# Patient Record
Sex: Female | Born: 1950 | Race: White | Hispanic: No | Marital: Married | Smoking: Former smoker
Health system: Southern US, Community
[De-identification: ages and names within clinical notes are randomized; demographics above are authoritative.]

## PROBLEM LIST (undated history)

## (undated) DIAGNOSIS — F419 Anxiety disorder, unspecified: Secondary | ICD-10-CM

## (undated) DIAGNOSIS — R079 Chest pain, unspecified: Secondary | ICD-10-CM

## (undated) DIAGNOSIS — Z8709 Personal history of other diseases of the respiratory system: Secondary | ICD-10-CM

## (undated) DIAGNOSIS — K449 Diaphragmatic hernia without obstruction or gangrene: Secondary | ICD-10-CM

## (undated) DIAGNOSIS — IMO0002 Reserved for concepts with insufficient information to code with codable children: Secondary | ICD-10-CM

## (undated) DIAGNOSIS — M797 Fibromyalgia: Secondary | ICD-10-CM

## (undated) DIAGNOSIS — Z780 Asymptomatic menopausal state: Secondary | ICD-10-CM

## (undated) DIAGNOSIS — N3941 Urge incontinence: Secondary | ICD-10-CM

## (undated) DIAGNOSIS — I10 Essential (primary) hypertension: Secondary | ICD-10-CM

## (undated) DIAGNOSIS — Z8701 Personal history of pneumonia (recurrent): Secondary | ICD-10-CM

## (undated) DIAGNOSIS — Z8619 Personal history of other infectious and parasitic diseases: Secondary | ICD-10-CM

## (undated) DIAGNOSIS — Z9289 Personal history of other medical treatment: Secondary | ICD-10-CM

## (undated) DIAGNOSIS — J45909 Unspecified asthma, uncomplicated: Secondary | ICD-10-CM

## (undated) DIAGNOSIS — L309 Dermatitis, unspecified: Secondary | ICD-10-CM

## (undated) DIAGNOSIS — M549 Dorsalgia, unspecified: Secondary | ICD-10-CM

## (undated) DIAGNOSIS — R112 Nausea with vomiting, unspecified: Secondary | ICD-10-CM

## (undated) DIAGNOSIS — G4730 Sleep apnea, unspecified: Secondary | ICD-10-CM

## (undated) DIAGNOSIS — F32A Depression, unspecified: Secondary | ICD-10-CM

## (undated) DIAGNOSIS — M79605 Pain in left leg: Secondary | ICD-10-CM

## (undated) DIAGNOSIS — K5909 Other constipation: Secondary | ICD-10-CM

## (undated) DIAGNOSIS — Z85830 Personal history of malignant neoplasm of bone: Secondary | ICD-10-CM

## (undated) DIAGNOSIS — G629 Polyneuropathy, unspecified: Secondary | ICD-10-CM

## (undated) DIAGNOSIS — M479 Spondylosis, unspecified: Secondary | ICD-10-CM

## (undated) DIAGNOSIS — K219 Gastro-esophageal reflux disease without esophagitis: Secondary | ICD-10-CM

## (undated) DIAGNOSIS — G546 Phantom limb syndrome with pain: Secondary | ICD-10-CM

## (undated) DIAGNOSIS — Z9889 Other specified postprocedural states: Secondary | ICD-10-CM

## (undated) DIAGNOSIS — T8859XA Other complications of anesthesia, initial encounter: Secondary | ICD-10-CM

## (undated) DIAGNOSIS — T4145XA Adverse effect of unspecified anesthetic, initial encounter: Secondary | ICD-10-CM

## (undated) DIAGNOSIS — C801 Malignant (primary) neoplasm, unspecified: Secondary | ICD-10-CM

## (undated) DIAGNOSIS — F329 Major depressive disorder, single episode, unspecified: Secondary | ICD-10-CM

## (undated) DIAGNOSIS — G479 Sleep disorder, unspecified: Secondary | ICD-10-CM

## (undated) DIAGNOSIS — L509 Urticaria, unspecified: Secondary | ICD-10-CM

## (undated) HISTORY — PX: EXTERNAL EAR SURGERY: SHX627

## (undated) HISTORY — PX: BACK SURGERY: SHX140

---

## 1971-01-15 HISTORY — PX: OTHER SURGICAL HISTORY: SHX169

## 1978-09-15 HISTORY — PX: ABDOMINAL HYSTERECTOMY: SHX81

## 2013-07-08 ENCOUNTER — Other Ambulatory Visit: Payer: Self-pay | Admitting: Orthopedic Surgery

## 2013-07-08 NOTE — Progress Notes (Signed)
need orders please - pt coming for preop MON 07/12/13 - thank you

## 2013-07-09 ENCOUNTER — Encounter (HOSPITAL_COMMUNITY): Payer: Self-pay | Admitting: Pharmacy Technician

## 2013-07-12 ENCOUNTER — Other Ambulatory Visit: Payer: Self-pay

## 2013-07-12 ENCOUNTER — Encounter (HOSPITAL_COMMUNITY): Payer: Self-pay | Admitting: Emergency Medicine

## 2013-07-12 ENCOUNTER — Emergency Department (HOSPITAL_COMMUNITY): Payer: BC Managed Care – PPO

## 2013-07-12 ENCOUNTER — Encounter (HOSPITAL_COMMUNITY)
Admission: RE | Admit: 2013-07-12 | Discharge: 2013-07-12 | Disposition: A | Discharge status: Home / Self Care | Payer: BC Managed Care – PPO | Source: Ambulatory Visit | Attending: Orthopedic Surgery | Admitting: Orthopedic Surgery

## 2013-07-12 ENCOUNTER — Encounter (HOSPITAL_COMMUNITY): Payer: Self-pay

## 2013-07-12 ENCOUNTER — Observation Stay (HOSPITAL_COMMUNITY)
Admission: EM | Admit: 2013-07-12 | Discharge: 2013-07-14 | Disposition: A | Discharge status: Home / Self Care | Payer: BC Managed Care – PPO | Attending: Internal Medicine | Admitting: Internal Medicine

## 2013-07-12 ENCOUNTER — Encounter (INDEPENDENT_AMBULATORY_CARE_PROVIDER_SITE_OTHER): Payer: Self-pay

## 2013-07-12 ENCOUNTER — Ambulatory Visit (HOSPITAL_COMMUNITY)
Admission: RE | Admit: 2013-07-12 | Discharge: 2013-07-12 | Disposition: A | Discharge status: Home / Self Care | Payer: BC Managed Care – PPO | Source: Ambulatory Visit | Attending: Orthopedic Surgery | Admitting: Orthopedic Surgery

## 2013-07-12 ENCOUNTER — Ambulatory Visit (HOSPITAL_COMMUNITY)
Admission: RE | Admit: 2013-07-12 | Discharge: 2013-07-12 | Disposition: A | Discharge status: Home / Self Care | Payer: BC Managed Care – PPO | Source: Ambulatory Visit | Attending: Anesthesiology | Admitting: Anesthesiology

## 2013-07-12 DIAGNOSIS — M169 Osteoarthritis of hip, unspecified: Secondary | ICD-10-CM | POA: Insufficient documentation

## 2013-07-12 DIAGNOSIS — I200 Unstable angina: Secondary | ICD-10-CM

## 2013-07-12 DIAGNOSIS — IMO0001 Reserved for inherently not codable concepts without codable children: Secondary | ICD-10-CM | POA: Insufficient documentation

## 2013-07-12 DIAGNOSIS — K449 Diaphragmatic hernia without obstruction or gangrene: Secondary | ICD-10-CM | POA: Diagnosis present

## 2013-07-12 DIAGNOSIS — F411 Generalized anxiety disorder: Secondary | ICD-10-CM | POA: Insufficient documentation

## 2013-07-12 DIAGNOSIS — S78119A Complete traumatic amputation at level between unspecified hip and knee, initial encounter: Secondary | ICD-10-CM | POA: Insufficient documentation

## 2013-07-12 DIAGNOSIS — I10 Essential (primary) hypertension: Secondary | ICD-10-CM | POA: Insufficient documentation

## 2013-07-12 DIAGNOSIS — F3289 Other specified depressive episodes: Secondary | ICD-10-CM | POA: Insufficient documentation

## 2013-07-12 DIAGNOSIS — Z79899 Other long term (current) drug therapy: Secondary | ICD-10-CM | POA: Insufficient documentation

## 2013-07-12 DIAGNOSIS — M1610 Unilateral primary osteoarthritis, unspecified hip: Secondary | ICD-10-CM | POA: Insufficient documentation

## 2013-07-12 DIAGNOSIS — J45909 Unspecified asthma, uncomplicated: Secondary | ICD-10-CM | POA: Insufficient documentation

## 2013-07-12 DIAGNOSIS — R21 Rash and other nonspecific skin eruption: Secondary | ICD-10-CM | POA: Insufficient documentation

## 2013-07-12 DIAGNOSIS — M797 Fibromyalgia: Secondary | ICD-10-CM | POA: Diagnosis present

## 2013-07-12 DIAGNOSIS — K219 Gastro-esophageal reflux disease without esophagitis: Secondary | ICD-10-CM

## 2013-07-12 DIAGNOSIS — Z8249 Family history of ischemic heart disease and other diseases of the circulatory system: Secondary | ICD-10-CM | POA: Insufficient documentation

## 2013-07-12 DIAGNOSIS — R0789 Other chest pain: Secondary | ICD-10-CM

## 2013-07-12 DIAGNOSIS — F329 Major depressive disorder, single episode, unspecified: Secondary | ICD-10-CM | POA: Insufficient documentation

## 2013-07-12 DIAGNOSIS — Z8589 Personal history of malignant neoplasm of other organs and systems: Secondary | ICD-10-CM | POA: Insufficient documentation

## 2013-07-12 DIAGNOSIS — R079 Chest pain, unspecified: Principal | ICD-10-CM

## 2013-07-12 DIAGNOSIS — Z87891 Personal history of nicotine dependence: Secondary | ICD-10-CM | POA: Insufficient documentation

## 2013-07-12 DIAGNOSIS — Z90710 Acquired absence of both cervix and uterus: Secondary | ICD-10-CM | POA: Insufficient documentation

## 2013-07-12 HISTORY — DX: Gastro-esophageal reflux disease without esophagitis: K21.9

## 2013-07-12 HISTORY — DX: Malignant (primary) neoplasm, unspecified: C80.1

## 2013-07-12 HISTORY — DX: Spondylosis, unspecified: M47.9

## 2013-07-12 HISTORY — DX: Fibromyalgia: M79.7

## 2013-07-12 HISTORY — DX: Urticaria, unspecified: L50.9

## 2013-07-12 HISTORY — DX: Dermatitis, unspecified: L30.9

## 2013-07-12 HISTORY — DX: Unspecified asthma, uncomplicated: J45.909

## 2013-07-12 HISTORY — DX: Reserved for concepts with insufficient information to code with codable children: IMO0002

## 2013-07-12 HISTORY — DX: Major depressive disorder, single episode, unspecified: F32.9

## 2013-07-12 HISTORY — DX: Anxiety disorder, unspecified: F41.9

## 2013-07-12 HISTORY — DX: Depression, unspecified: F32.A

## 2013-07-12 HISTORY — DX: Personal history of other medical treatment: Z92.89

## 2013-07-12 HISTORY — DX: Other complications of anesthesia, initial encounter: T88.59XA

## 2013-07-12 HISTORY — DX: Chest pain, unspecified: R07.9

## 2013-07-12 HISTORY — DX: Other constipation: K59.09

## 2013-07-12 HISTORY — DX: Personal history of malignant neoplasm of bone: Z85.830

## 2013-07-12 HISTORY — DX: Adverse effect of unspecified anesthetic, initial encounter: T41.45XA

## 2013-07-12 HISTORY — DX: Urge incontinence: N39.41

## 2013-07-12 HISTORY — DX: Sleep disorder, unspecified: G47.9

## 2013-07-12 HISTORY — DX: Phantom limb syndrome with pain: G54.6

## 2013-07-12 HISTORY — DX: Pain in left leg: M79.605

## 2013-07-12 LAB — COMPREHENSIVE METABOLIC PANEL
ALK PHOS: 84 U/L (ref 39–117)
ALT: 16 U/L (ref 0–35)
AST: 22 U/L (ref 0–37)
Albumin: 3.7 g/dL (ref 3.5–5.2)
BILIRUBIN TOTAL: 0.3 mg/dL (ref 0.3–1.2)
BUN: 11 mg/dL (ref 6–23)
CHLORIDE: 105 meq/L (ref 96–112)
CO2: 24 meq/L (ref 19–32)
Calcium: 9.1 mg/dL (ref 8.4–10.5)
Creatinine, Ser: 0.92 mg/dL (ref 0.50–1.10)
GFR calc Af Amer: 76 mL/min — ABNORMAL LOW (ref >90)
GFR, EST NON AFRICAN AMERICAN: 65 mL/min — AB (ref >90)
Glucose, Bld: 122 mg/dL — ABNORMAL HIGH (ref 70–99)
POTASSIUM: 4.4 meq/L (ref 3.7–5.3)
Sodium: 142 mEq/L (ref 137–147)
Total Protein: 7.7 g/dL (ref 6.0–8.3)

## 2013-07-12 LAB — PROTIME-INR
INR: 0.99 (ref 0.00–1.49)
PROTHROMBIN TIME: 13.1 s (ref 11.6–15.2)

## 2013-07-12 LAB — CBC
HEMATOCRIT: 36.7 % (ref 36.0–46.0)
HEMATOCRIT: 37.5 % (ref 36.0–46.0)
HEMOGLOBIN: 12.2 g/dL (ref 12.0–15.0)
Hemoglobin: 12.3 g/dL (ref 12.0–15.0)
MCH: 31.6 pg (ref 26.0–34.0)
MCH: 31.1 pg (ref 26.0–34.0)
MCHC: 33.2 g/dL (ref 30.0–36.0)
MCHC: 32.8 g/dL (ref 30.0–36.0)
MCV: 95.1 fL (ref 78.0–100.0)
MCV: 94.7 fL (ref 78.0–100.0)
Platelets: 364 10*3/uL (ref 150–400)
Platelets: 385 10*3/uL (ref 150–400)
RBC: 3.86 MIL/uL — AB (ref 3.87–5.11)
RBC: 3.96 MIL/uL (ref 3.87–5.11)
RDW: 12.6 % (ref 11.5–15.5)
RDW: 12.6 % (ref 11.5–15.5)
WBC: 11.2 10*3/uL — ABNORMAL HIGH (ref 4.0–10.5)
WBC: 10.9 10*3/uL — ABNORMAL HIGH (ref 4.0–10.5)

## 2013-07-12 LAB — BASIC METABOLIC PANEL
BUN: 12 mg/dL (ref 6–23)
CHLORIDE: 105 meq/L (ref 96–112)
CO2: 24 mEq/L (ref 19–32)
Calcium: 9.1 mg/dL (ref 8.4–10.5)
Creatinine, Ser: 0.91 mg/dL (ref 0.50–1.10)
GFR calc Af Amer: 77 mL/min — ABNORMAL LOW (ref >90)
GFR calc non Af Amer: 66 mL/min — ABNORMAL LOW (ref >90)
Glucose, Bld: 105 mg/dL — ABNORMAL HIGH (ref 70–99)
Potassium: 4.3 mEq/L (ref 3.7–5.3)
Sodium: 141 mEq/L (ref 137–147)

## 2013-07-12 LAB — URINALYSIS, ROUTINE W REFLEX MICROSCOPIC
BILIRUBIN URINE: NEGATIVE
GLUCOSE, UA: NEGATIVE mg/dL
HGB URINE DIPSTICK: NEGATIVE
KETONES UR: NEGATIVE mg/dL
Leukocytes, UA: NEGATIVE
Nitrite: NEGATIVE
PROTEIN: NEGATIVE mg/dL
Specific Gravity, Urine: 1.002 — ABNORMAL LOW (ref 1.005–1.030)
Urobilinogen, UA: 0.2 mg/dL (ref 0.0–1.0)
pH: 6.5 (ref 5.0–8.0)

## 2013-07-12 LAB — SURGICAL PCR SCREEN
MRSA, PCR: NEGATIVE
Staphylococcus aureus: NEGATIVE

## 2013-07-12 LAB — I-STAT TROPONIN, ED
TROPONIN I, POC: 0 ng/mL (ref 0.00–0.08)
Troponin i, poc: 0 ng/mL (ref 0.00–0.08)

## 2013-07-12 LAB — APTT: aPTT: 35 seconds (ref 24–37)

## 2013-07-12 LAB — TROPONIN I

## 2013-07-12 LAB — D-DIMER, QUANTITATIVE: D-Dimer, Quant: 0.86 ug/mL-FEU — ABNORMAL HIGH (ref 0.00–0.48)

## 2013-07-12 MED ORDER — DIPHENHYDRAMINE HCL 50 MG/ML IJ SOLN
25.0000 mg | Freq: Once | INTRAMUSCULAR | Status: AC
  Administered 2013-07-12: 25 mg via INTRAVENOUS
  Filled 2013-07-12: qty 1

## 2013-07-12 MED ORDER — BUPROPION HCL ER (SR) 150 MG PO TB12
150.0000 mg | ORAL_TABLET | Freq: Two times a day (BID) | ORAL | Status: DC
  Administered 2013-07-13 – 2013-07-14 (×3): 150 mg via ORAL
  Filled 2013-07-12 (×5): qty 1

## 2013-07-12 MED ORDER — MORPHINE SULFATE 2 MG/ML IJ SOLN
2.0000 mg | INTRAMUSCULAR | Status: DC | PRN
Start: 2013-07-12 — End: 2013-07-14

## 2013-07-12 MED ORDER — IRBESARTAN 150 MG PO TABS
150.0000 mg | ORAL_TABLET | Freq: Two times a day (BID) | ORAL | Status: DC
  Administered 2013-07-12 – 2013-07-14 (×4): 150 mg via ORAL
  Filled 2013-07-12 (×5): qty 1

## 2013-07-12 MED ORDER — SODIUM CHLORIDE 0.9 % IJ SOLN
3.0000 mL | Freq: Two times a day (BID) | INTRAMUSCULAR | Status: DC
  Administered 2013-07-12 – 2013-07-14 (×4): 3 mL via INTRAVENOUS

## 2013-07-12 MED ORDER — IOHEXOL 350 MG/ML SOLN
100.0000 mL | Freq: Once | INTRAVENOUS | Status: AC | PRN
  Administered 2013-07-12: 100 mL via INTRAVENOUS

## 2013-07-12 MED ORDER — BUSPIRONE HCL 10 MG PO TABS
10.0000 mg | ORAL_TABLET | Freq: Two times a day (BID) | ORAL | Status: DC
  Administered 2013-07-12 – 2013-07-14 (×4): 10 mg via ORAL
  Filled 2013-07-12 (×5): qty 1

## 2013-07-12 MED ORDER — CITALOPRAM HYDROBROMIDE 10 MG PO TABS
10.0000 mg | ORAL_TABLET | Freq: Every morning | ORAL | Status: DC
  Administered 2013-07-13 – 2013-07-14 (×2): 10 mg via ORAL
  Filled 2013-07-12 (×2): qty 1

## 2013-07-12 MED ORDER — ASPIRIN 325 MG PO TABS
325.0000 mg | ORAL_TABLET | Freq: Once | ORAL | Status: AC
  Administered 2013-07-12: 325 mg via ORAL
  Filled 2013-07-12: qty 1

## 2013-07-12 MED ORDER — HEPARIN SODIUM (PORCINE) 5000 UNIT/ML IJ SOLN
5000.0000 [IU] | Freq: Three times a day (TID) | INTRAMUSCULAR | Status: DC
  Administered 2013-07-12 – 2013-07-14 (×6): 5000 [IU] via SUBCUTANEOUS
  Filled 2013-07-12 (×8): qty 1

## 2013-07-12 MED ORDER — METHYLPREDNISOLONE SODIUM SUCC 125 MG IJ SOLR
125.0000 mg | Freq: Once | INTRAMUSCULAR | Status: AC
  Administered 2013-07-12: 125 mg via INTRAVENOUS
  Filled 2013-07-12: qty 2

## 2013-07-12 MED ORDER — DIPHENHYDRAMINE HCL 50 MG/ML IJ SOLN
50.0000 mg | Freq: Once | INTRAMUSCULAR | Status: AC
  Administered 2013-07-12: 50 mg via INTRAVENOUS
  Filled 2013-07-12: qty 1

## 2013-07-12 MED ORDER — CYCLOBENZAPRINE HCL 10 MG PO TABS
10.0000 mg | ORAL_TABLET | Freq: Two times a day (BID) | ORAL | Status: DC
  Administered 2013-07-13 – 2013-07-14 (×2): 10 mg via ORAL
  Filled 2013-07-12 (×5): qty 1

## 2013-07-12 MED ORDER — PANTOPRAZOLE SODIUM 40 MG PO TBEC
40.0000 mg | DELAYED_RELEASE_TABLET | Freq: Every day | ORAL | Status: DC
Start: 2013-07-12 — End: 2013-07-14
  Administered 2013-07-13 – 2013-07-14 (×2): 40 mg via ORAL
  Filled 2013-07-12 (×2): qty 1

## 2013-07-12 MED ORDER — ALBUTEROL SULFATE HFA 108 (90 BASE) MCG/ACT IN AERS: 2.0000 | INHALATION_SPRAY | Freq: Four times a day (QID) | RESPIRATORY_TRACT | Status: DC | PRN

## 2013-07-12 MED ORDER — ACETAMINOPHEN 325 MG PO TABS: 650.0000 mg | ORAL_TABLET | Freq: Four times a day (QID) | ORAL | Status: DC | PRN

## 2013-07-12 MED ORDER — ACETAMINOPHEN 650 MG RE SUPP: 650.0000 mg | Freq: Four times a day (QID) | RECTAL | Status: DC | PRN

## 2013-07-12 MED ORDER — ALBUTEROL SULFATE (2.5 MG/3ML) 0.083% IN NEBU: 2.5000 mg | INHALATION_SOLUTION | Freq: Four times a day (QID) | RESPIRATORY_TRACT | Status: DC | PRN

## 2013-07-12 NOTE — Progress Notes (Signed)
EKG taken to Dr. Lissa Hoard - informed that pt has been having intermit ant chest pain past several days - Dr. Lissa Hoard said pt needed cardiac evaluation before surgery.pt has never seen a cardiologist . Pt states she thinks it is esophageal problem but does not go to GI doctor. She said her PCP started her on Nexium. No change in pain. Pt said the chest "squeezing " type pain had started again before leaving PST. I transported pt to ED for further evaluation. Message left with Pollyann Savoy at Dr. Anne Fu office.

## 2013-07-12 NOTE — Patient Instructions (Addendum)
[name]  07/12/2013                           YOUR PROCEDURE IS SCHEDULED ON: 07/28/13               ENTER THRU [facility] ENTRANCE AND                            FOLLOW  SIGNS TO SHORT STAY CENTER                 ARRIVE AT SHORT STAY AT:7:45 AM               CALL THIS NUMBER IF ANY PROBLEMS THE DAY OF SURGERY :               [phone]                                REMEMBER:   Do not eat food or drink liquids AFTER MIDNIGHT   May have clear liquids UNTIL 6 HOURS BEFORE SURGERY               Take these medicines the morning of surgery with               A SIPS OF WATER :  WELLBUTRIN / BUSPAR / CELEXA / ALLEGRA       Do not wear jewelry, make-up   Do not wear lotions, powders, or perfumes.   Do not shave legs or underarms 12 hrs. before surgery (men may shave face)  Do not bring valuables to the hospital.  Contacts, dentures or bridgework may not be worn into surgery.  Leave suitcase in the car. After surgery it may be brought to your room.  For patients admitted to the hospital more than one night, checkout time is            11:00 AM                                                       The day of discharge.   Patients discharged the day of surgery will not be allowed to drive home.            If going home same day of surgery, must have someone stay with you              FIRST 24 hrs at home and arrange for some one to drive you              home from hospital.   ________________________________________________________________________                                                                        [place]  Before surgery, you can play an important role.  Because skin is not sterile, your skin needs to be as free of germs as possible.  You can reduce the number of germs on your skin by washing with CHG (chlorahexidine gluconate) soap before surgery.  CHG is an antiseptic cleaner which kills germs and  bonds with the skin to continue killing germs even after washing. Please DO NOT use if you have an allergy to CHG or antibacterial soaps.  If your skin becomes reddened/irritated stop using the CHG and inform your nurse when you arrive at Short Stay. Do not shave (including legs and underarms) for at least 48 hours prior to the first CHG shower.  You may shave your face. Please follow these instructions carefully:   1.  Shower with CHG Soap the night before surgery and the  morning of Surgery.   2.  If you choose to wash your hair, wash your hair first as usual with your  normal  Shampoo.   3.  After you shampoo, rinse your hair and body thoroughly to remove the  shampoo.                                         4.  Use CHG as you would any other liquid soap.  You can apply chg directly  to the skin and wash . Gently wash with scrungie or clean wascloth    5.  Apply the CHG Soap to your body ONLY FROM THE NECK DOWN.   Do not use on open                           Wound or open sores. Avoid contact with eyes, ears mouth and genitals (private parts).                        Genitals (private parts) with your normal soap.              6.  Wash thoroughly, paying special attention to the area where your surgery  will be performed.   7.  Thoroughly rinse your body with warm water from the neck down.   8.  DO NOT shower/wash with your normal soap after using and rinsing off  the CHG Soap .                9.  Pat yourself dry with a clean towel.             10.  Wear clean pajamas.             11.  Place clean sheets on your bed the night of your first shower and do not  sleep with pets.  Day of Surgery : Do not apply any lotions/deodorants the morning of surgery.  Please wear clean clothes to the hospital/surgery center.  FAILURE TO FOLLOW THESE INSTRUCTIONS MAY RESULT IN THE CANCELLATION OF YOUR SURGERY    PATIENT  SIGNATURE_________________________________  ______________________________________________________________________                   PATIENT SIGNATURE_________________________________  ______________________________________________________________________    [name]  An incentive spirometer is a tool that can help keep your lungs clear and active. This tool measures how well you are filling your lungs with each breath. Taking long deep breaths may help reverse or decrease the chance of developing breathing (pulmonary) problems (especially infection) following:  A long period of time when you are unable to move  or be active. BEFORE THE PROCEDURE   If the spirometer includes an indicator to show your best effort, your nurse or respiratory therapist will set it to a desired goal.  If possible, sit up straight or lean slightly forward. Try not to slouch.  Hold the incentive spirometer in an upright position. INSTRUCTIONS FOR USE  1. Sit on the edge of your bed if possible, or sit up as far as you can in bed or on a chair. 2. Hold the incentive spirometer in an upright position. 3. Breathe out normally. 4. Place the mouthpiece in your mouth and seal your lips tightly around it. 5. Breathe in slowly and as deeply as possible, raising the piston or the ball toward the top of the column. 6. Hold your breath for 3-5 seconds or for as long as possible. Allow the piston or ball to fall to the bottom of the column. 7. Remove the mouthpiece from your mouth and breathe out normally. 8. Rest for a few seconds and repeat Steps 1 through 7 at least 10 times every 1-2 hours when you are awake. Take your time and take a few normal breaths between deep breaths. 9. The spirometer may include an indicator to show your best effort. Use the indicator as a goal to work toward during each repetition. 10. After each set of 10 deep breaths, practice coughing to be sure your lungs are clear. If you  have an incision (the cut made at the time of surgery), support your incision when coughing by placing a pillow or rolled up towels firmly against it. Once you are able to get out of bed, walk around indoors and cough well. You may stop using the incentive spirometer when instructed by your caregiver.  RISKS AND COMPLICATIONS  Take your time so you do not get dizzy or light-headed.  If you are in pain, you may need to take or ask for pain medication before doing incentive spirometry. It is harder to take a deep breath if you are having pain. AFTER USE  Rest and breathe slowly and easily.  It can be helpful to keep track of a log of your progress. Your caregiver can provide you with a simple table to help with this. If you are using the spirometer at home, follow these instructions: [place] IF:   You are having difficultly using the spirometer.  You have trouble using the spirometer as often as instructed.  Your pain medication is not giving enough relief while using the spirometer.  You develop fever of 100.5 F (38.1 C) or higher. SEEK IMMEDIATE MEDICAL CARE IF:   You cough up bloody sputum that had not been present before.  You develop fever of 102 F (38.9 C) or greater.  You develop worsening pain at or near the incision site. MAKE SURE YOU:   Understand these instructions.  Will watch your condition.  Will get help right away if you are not doing well or get worse. Document Released: 05/13/2006 Document Revised: 03/25/2011 Document Reviewed: 07/14/2006 [employer] Patient Information 2014 [employer], [place].   ________________________________________________________________________   Incentive Spirometer  WHAT IS A BLOOD TRANSFUSION? Blood Transfusion Information  A transfusion is the replacement of blood or some of its parts. Blood is made up of multiple cells which provide different functions.  Red blood cells carry oxygen and are used for blood loss  replacement.  White blood cells fight against infection.  Platelets control bleeding.  Plasma helps clot blood.  Other blood  products are available for specialized needs, such as hemophilia or other clotting disorders. BEFORE THE TRANSFUSION  Who gives blood for transfusions?   Healthy volunteers who are fully evaluated to make sure their blood is safe. This is blood bank blood. Transfusion therapy is the safest it has ever been in the practice of medicine. Before blood is taken from a donor, a complete history is taken to make sure that person has no history of diseases nor engages in risky social behavior (examples are intravenous drug use or sexual activity with multiple partners). The donor's travel history is screened to minimize risk of transmitting infections, such as malaria. The donated blood is tested for signs of infectious diseases, such as HIV and hepatitis. The blood is then tested to be sure it is compatible with you in order to minimize the chance of a transfusion reaction. If you or a relative donates blood, this is often done in anticipation of surgery and is not appropriate for emergency situations. It takes many days to process the donated blood. RISKS AND COMPLICATIONS Although transfusion therapy is very safe and saves many lives, the main dangers of transfusion include:   Getting an infectious disease.  Developing a transfusion reaction. This is an allergic reaction to something in the blood you were given. Every precaution is taken to prevent this. The decision to have a blood transfusion has been considered carefully by your caregiver before blood is given. Blood is not given unless the benefits outweigh the risks. AFTER THE TRANSFUSION  Right after receiving a blood transfusion, you will usually feel much better and more energetic. This is especially true if your red blood cells have gotten low (anemic). The transfusion raises the level of the red blood cells which  carry oxygen, and this usually causes an energy increase.  The nurse administering the transfusion will monitor you carefully for complications. HOME CARE INSTRUCTIONS  No special instructions are needed after a transfusion. You may find your energy is better. Speak with your caregiver about any limitations on activity for underlying diseases you may have. SEEK MEDICAL CARE IF:   Your condition is not improving after your transfusion.  You develop redness or irritation at the intravenous (IV) site. SEEK IMMEDIATE MEDICAL CARE IF:  Any of the following symptoms occur over the next 12 hours:  Shaking chills.  You have a temperature by mouth above 102 F (38.9 C), not controlled by medicine.  Chest, back, or muscle pain.  People around you feel you are not acting correctly or are confused.  Shortness of breath or difficulty breathing.  Dizziness and fainting.  You get a rash or develop hives.  You have a decrease in urine output.  Your urine turns a dark color or changes to pink, red, or brown. Any of the following symptoms occur over the next 10 days:  You have a temperature by mouth above 102 F (38.9 C), not controlled by medicine.  Shortness of breath.  Weakness after normal activity.  The white part of the eye turns yellow (jaundice).  You have a decrease in the amount of urine or are urinating less often.  Your urine turns a dark color or changes to pink, red, or brown. Document Released: 12/29/1999 Document Revised: 03/25/2011 Document Reviewed: 08/17/2007 [employer] Patient Information 2014 [place], [place].  _______________________________________________________________________tion. If you or a relative donates blood, this is often done in anticipation of surgery and is not appropriate for emergency situations. It takes many days to process the donated blood.  RISKS AND COMPLICATIONS Although transfusion therapy is very safe and saves many lives, the main dangers  of transfusion include:   Getting an infectious disease.  Developing a transfusion reaction. This is an allergic reaction to something in the blood you were given. Every precaution is taken to prevent this. The decision to have a blood transfusion has been considered carefully by your caregiver before blood is given. Blood is not given unless the benefits outweigh the risks. AFTER THE TRANSFUSION  Right after receiving a blood transfusion, you will usually feel much better and more energetic. This is especially true if your red blood cells have gotten low (anemic). The transfusion raises the level of the red blood cells which carry oxygen, and this usually causes an energy increase.  The nurse administering the transfusion will monitor you carefully for complications. HOME CARE INSTRUCTIONS  No special instructions are needed after a transfusion. You may find your energy is better. Speak with your caregiver about any limitations on activity for underlying diseases you may have. SEEK MEDICAL CARE IF:   Your condition is not improving after your transfusion.  You develop redness or irritation at the intravenous (IV) site. SEEK IMMEDIATE MEDICAL CARE IF:  Any of the following symptoms occur over the next 12 hours:  Shaking chills.  You have a temperature by mouth above 102 F (38.9 C), not controlled by medicine.  Chest, back, or muscle pain.  People around you feel you are not acting correctly or are confused.  Shortness of breath or difficulty breathing.  Dizziness and fainting.  You get a rash or develop hives.  You have a decrease in urine output.  Your urine turns a dark color or changes to pink, red, or brown. Any of the following symptoms occur over the next 10 days:  You have a temperature by mouth above 102 F (38.9 C), not controlled by medicine.  Shortness of breath.  Weakness after normal activity.  The white part of the eye turns yellow (jaundice).  You  have a decrease in the amount of urine or are urinating less often.  Your urine turns a dark color or changes to pink, red, or brown. Document Released: 12/29/1999 Document Revised: 03/25/2011 Document Reviewed: 08/17/2007 [facility] Patient Information 2014 [place], [place].  _______________________________________________________________________

## 2013-07-12 NOTE — H&P (Signed)
[facility] History and Physical  [name] IRW:[id number] DOB: 1950-02-03 DOA: 07/12/2013  Referring physician: Emergency Department PCP: Shary Key, MD  Specialists:   Chief Complaint: Abnormal EKG/chest pain  HPI: [name] is a 63 y.o. 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 In the ED, cardiac enzymes neg x 1. D-dimer was mildly elevated with f/u CTA chest neg for PE. Hospitalist was consulted for consideration for admission  On further questioning, pt reports chest pain that begins in the epigastric region and radiates up the throat. Certain fruits aggravate the sx. Denies nausea, diaphoresis, or sob.   Review of Systems:  Per above, the remainder of the 10pt ros reviewed and are neg  Past Medical History  Diagnosis Date  . Complication of anesthesia     NAUSEA  . Chest pain     pt states due to esophageal problem -  primary care MD aware   . Hives     since pneumonia vaccine   . Asthma   . Depression   . Anxiety   . Fibromyalgia   . Spondylosis   . Bulging disc   . 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 Phantom limb pain     rt leg  . Leg pain, left   . Eczema     ears  . GERD (gastroesophageal reflux disease)     " alot of burping"  . Constipation, chronic   . Urgency incontinence   . History of transfusion   . Cancer     hx sarcoma rt knee with amputation  . Difficulty sleeping    Past Surgical History  Procedure Laterality Date  . Abdominal hysterectomy  1980'S  . Sarcoma  1973    RT KNEE (aka)  . External ear surgery  AGE 32   Social History:  reports that she quit smoking about 6 years ago. She does not have any smokeless tobacco history on file. She reports that she does not drink alcohol or use illicit drugs.  where does  patient live--home, ALF, SNF? and with whom if at home?  Can patient participate in ADLs?  Allergies  Allergen Reactions  . Bactrim [Sulfamethoxazole-Tmp Ds] Shortness Of Breath  . Cefuroxime Shortness Of Breath  . Omnipaque [Iohexol]     Patient stated that she had a reaction a few years ago to the IV dye and had trouble breathing--so for her CT Angio Chest today (07/12/13), ER MD gave her solumedrol and Benadryl before she had test and did very well with that.  ak 07/12/13  . Penicillins Diarrhea  . Sulfa Antibiotics Other (See Comments)    unknown    No family history on file. father and sister with cad in their 44's   Prior to Admission medications   Medication Sig Start Date End Date Taking? Authorizing Malin Cervini  albuterol (PROVENTIL HFA;VENTOLIN HFA) 108 (90 BASE) MCG/ACT inhaler Inhale 2 puffs into the lungs every 6 (six) hours as needed for wheezing or shortness of breath.   Yes Historical Freya Zobrist, MD  buPROPion (WELLBUTRIN SR) 150 MG 12 hr tablet Take 150 mg by mouth 2 (two) times daily.   Yes Historical Carlea Badour, MD  busPIRone (BUSPAR) 10 MG tablet Take 10 mg by mouth 2 (  two) times daily.   Yes Historical Isiah Scheel, MD  Cholecalciferol (VITAMIN D) 2000 UNITS tablet Take 2,000 Units by mouth daily.   Yes Historical Edrees Valent, MD  citalopram (CELEXA) 10 MG tablet Take 10 mg by mouth every morning.   Yes Historical Darwin Guastella, MD  cyclobenzaprine (FLEXERIL) 10 MG tablet Take 10 mg by mouth 2 (two) times daily.    Yes Historical Raevon Broom, MD  diphenhydrAMINE (BENADRYL) 25 MG tablet Take 25 mg by mouth every 6 (six) hours as needed for itching.   Yes Historical Deniz Hannan, MD  esomeprazole (NEXIUM) 20 MG capsule Take 20 mg by mouth daily at 12 noon.   Yes Historical Latanga Nedrow, MD  fexofenadine (ALLEGRA) 180 MG tablet Take 180 mg by mouth daily.   Yes Historical Nathanyl Andujo, MD  fluocinolone (SYNALAR) 0.01 % external solution Place 2 drops into both ears 2 (two) times daily as needed (itching).    Yes Historical Donaldo Teegarden, MD  ibuprofen (ADVIL,MOTRIN) 200 MG tablet Take 600 mg by mouth every 6 (six) hours as needed for moderate pain.   Yes Historical Chrystal Zeimet, MD  irbesartan (AVAPRO) 150 MG tablet Take 150 mg by mouth 2 (two) times daily.   Yes Historical Barrington Worley, MD   Physical Exam: Filed Vitals:   07/12/13 1256 07/12/13 1503 07/12/13 1758  BP: 125/81 144/72 142/78  Pulse: 78 79 87  Temp: 98.5 F (36.9 C)    TempSrc: Oral    Resp: 20 19 15   SpO2: 100% 99% 97%     General:  Awake, in nad  Eyes: PERRL B  ENT: membranes moist, dentition fair  Neck: trachea midline, neck supple  Cardiovascular: regular, s1, s2  Respiratory: normal resp effort, no wheezing  Abdomen: soft,non distended  Skin: normal skin turgor, no abnormal skin lesions seen  Musculoskeletal: perfused, s/p RLE amputation  Psychiatric: mood/affect normal // no auditory/visual hallucinations  Neurologic: cn2-12 grossly intact, strength/sensation intact  Labs on Admission:  Basic Metabolic Panel:  Recent Labs Lab 07/12/13 1005 07/12/13 1302  NA 142 141  K 4.4 4.3  CL 105 105  CO2 24 24  GLUCOSE 122* 105*  BUN 11 12  CREATININE 0.92 0.91  CALCIUM 9.1 9.1   Liver Function Tests:  Recent Labs Lab 07/12/13 1005  AST 22  ALT 16  ALKPHOS 84  BILITOT 0.3  PROT 7.7  ALBUMIN 3.7   No results found for this basename: LIPASE, AMYLASE,  in the last 168 hours No results found for this basename: AMMONIA,  in the last 168 hours CBC:  Recent Labs Lab 07/12/13 1005 07/12/13 1302  WBC 11.2* 10.9*  HGB 12.2 12.3  HCT 36.7 37.5  MCV 95.1 94.7  PLT 364 385   Cardiac Enzymes: No results found for this basename: CKTOTAL, CKMB, CKMBINDEX, TROPONINI,  in the last 168 hours  BNP (last 3 results) No results found for this basename: PROBNP,  in the last 8760 hours CBG: No results found for this basename: GLUCAP,  in the last 168 hours  Radiological Exams on Admission: Dg Chest 2  View  07/12/2013   CLINICAL DATA:  PREOP L TOTAL HIP / HX HBP  EXAM: CHEST  2 VIEW  COMPARISON:  None.  FINDINGS: The heart size and mediastinal contours are within normal limits. Both lungs are clear. The visualized skeletal structures are unremarkable. Minimal discoid atelectasis versus scarring in the lung bases.  IMPRESSION: No active cardiopulmonary disease.   Electronically Signed   By: Margaree Mackintosh M.D.   On: 07/12/2013 13:16  Dg Hip Complete Left  07/12/2013   CLINICAL DATA:  Left hip osteoarthritis.  EXAM: LEFT HIP - COMPLETE 2+ VIEW  COMPARISON:  None.  FINDINGS: Severe narrowing of the superior portion of the left hip joint is noted consistent with osteoarthritis. No fracture or dislocation is noted.  IMPRESSION: Severe osteoarthritis of left hip is noted.   Electronically Signed   By: Sabino Dick M.D.   On: 07/12/2013 13:16   Ct Angio Chest Pe W/cm &/or Wo Cm  07/12/2013   CLINICAL DATA:  Elevated D-dimers, chest pain  EXAM: CT ANGIOGRAPHY CHEST WITH CONTRAST  TECHNIQUE: Multidetector CT imaging of the chest was performed using the standard protocol during bolus administration of intravenous contrast. Multiplanar CT image reconstructions and MIPs were obtained to evaluate the vascular anatomy.  CONTRAST:  153mL OMNIPAQUE IOHEXOL 350 MG/ML SOLN  COMPARISON:  Chest x-ray July 12, 2013  FINDINGS: There is no pulmonary embolus. There is no mediastinal or hilar lymphadenopathy. There is no pulmonary edema or pleural effusion. There is a 2 mm nodule in the left upper lobe image 16. There is a 1.5 cm nodular density in the posterior basal segment of the right lower lobe image 61. The heart size is normal. There is no pericardial effusion. The visualized upper abdominal structures are unremarkable. There is a minimal hiatal hernia. Degenerative joint changes of the spine are noted.  Review of the MIP images confirms the above findings.  IMPRESSION: No pulmonary embolus. No acute abnormality identified  within the chest.  Pulmonary nodules as described. The largest nodule measures 1.5 cm in the right lower lobe. Further evaluation with PET CT is recommended.   Electronically Signed   By: Abelardo Diesel M.D.   On: 07/12/2013 17:26    EKG: Independently reviewed. Questionable twave inversion at lead 3  Assessment/Plan Active Problems:   Chest pain  1. Chest pain 1. Atypical in nature 2. Will follow serial cardiac enzymes 3. Will order 2d echo to rule out WMA 4. Cont with analgesics as tolerated 5. Will repeat EKG in AM 6. Admit to med tele, obs 2. GERD 1. Cont with PPI as tolerated 3. Osteoarthritis of the hip 1. Followed by Ortho - pending outpatient hip replacement 4. Fibromyalgia 1. Stable 2. Cont analgesics 5. DVT prophylaxis 1. Heparin subQ  Code Status: Full (must indicate code status--if unknown or must be presumed, indicate so) Family Communication: Pt in room (indicate person spoken with, if applicable, with phone number if by telephone) Disposition Plan: Pending (indicate anticipated LOS)  Time spent: 28min  [facility], [facility] Pager [phone]  If 7PM-7AM, please contact night-coverage [email or web address] Password [facility] 07/12/2013, 6:06 PM

## 2013-07-12 NOTE — ED Provider Notes (Signed)
CSN: [id number]     Arrival date & time 07/12/13  1251 History   First MD Initiated Contact with Patient 07/12/13 1347     Chief Complaint  Patient presents with  . Chest Pain     (Consider location/radiation/quality/duration/timing/severity/associated sxs/prior Treatment) HPI 63 year old female without a history of documented coronary artery disease was obtaining preoperative routine labs EKG and chest x-ray today in preparation for a hip replacement when the screening preoperative nurse noticed the patient had an abnormal ECG and referred the patient to the emergency department for evaluation. The patient states over the last couple weeks she has had several episodes per day over the last 2 weeks of an atypical chest pain syndrome felt as less than 1 minute at a time episodes of a nonexertional nonpleuritic non-positional localized squeezing discomfort in her chest without radiation except toward neck or associated symptoms such as fever cough shortness of breath sweats nausea lightheadedness or vomiting or other concerns. She is not currently having her chest pain syndrome. There is no treatment prior to arrival. She states she also has noticed in the last few weeks that she has had some more shortness of breath with exertion than she recalls previously but has not been having any exertional chest discomfort. She has not had shortness of breath during her chest pain spells. She has a prior leg amputation walks with crutches. She has taken Nexium the last 4 days without change in her chest pain syndrome. Past Medical History  Diagnosis Date  . Complication of anesthesia     NAUSEA  . Chest pain     pt states due to esophageal problem -  primary care MD aware   . Hives     since pneumonia vaccine   . Asthma   . Depression   . Anxiety   . Fibromyalgia   . Spondylosis   . Bulging disc   . 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 Phantom limb pain     rt leg  . Leg pain, left   .  Eczema     ears  . GERD (gastroesophageal reflux disease)     " alot of burping"  . Constipation, chronic   . Urgency incontinence   . History of transfusion   . Cancer     hx sarcoma rt knee with amputation  . Difficulty sleeping   . Sleep apnea     pt had recent sleep study done but does not know results - report called for and put on chart showing significant obstructive sleep apnea   Past Surgical History  Procedure Laterality Date  . Abdominal hysterectomy  1980'S  . Sarcoma  1973    RT KNEE (aka)  . External ear surgery  AGE 18   Family History  Problem Relation Age of Onset  . CAD Sister     CABG, stents  . CAD Father    History  Substance Use Topics  . Smoking status: Former Smoker    Quit date: 07/13/2007  . Smokeless tobacco: Not on file  . Alcohol Use: No   OB History   Grav Para Term Preterm Abortions TAB SAB Ect Mult Living                 Review of Systems  10 Systems reviewed and are negative for acute change except as noted in the HPI.  Allergies  Bactrim; Cefuroxime; Pneumococcal vaccines; Omnipaque; Penicillins; and Sulfa antibiotics  Home Medications   Prior to Admission medications   Medication Sig Start Date End Date Taking? Authorizing Kamala Kolton  albuterol (PROVENTIL HFA;VENTOLIN HFA) 108 (90 BASE) MCG/ACT inhaler Inhale 2 puffs into the lungs every 6 (six) hours as needed for wheezing or shortness of breath.   Yes Historical Dandria Griego, MD  buPROPion (WELLBUTRIN SR) 150 MG 12 hr tablet Take 150 mg by mouth 2 (two) times daily.   Yes Historical Marchelle Rinella, MD  busPIRone (BUSPAR) 10 MG tablet Take 10 mg by mouth 2 (two) times daily.   Yes Historical Shyna Duignan, MD  Cholecalciferol (VITAMIN D) 2000 UNITS tablet Take 2,000 Units by mouth daily.   Yes Historical Labrisha Wuellner, MD  citalopram (CELEXA) 10 MG tablet Take 10 mg by mouth every morning.   Yes Historical Cherine Drumgoole, MD  cyclobenzaprine (FLEXERIL) 10 MG tablet Take 10 mg by mouth 2 (two) times daily.     Yes Historical Nyeem Stoke, MD  fexofenadine (ALLEGRA) 180 MG tablet Take 180 mg by mouth daily.   Yes Historical Keyona Emrich, MD  fluocinolone (SYNALAR) 0.01 % external solution Place 2 drops into both ears 2 (two) times daily as needed (itching).   Yes Historical Kerrick Miler, MD  irbesartan (AVAPRO) 150 MG tablet Take 150 mg by mouth 2 (two) times daily.   Yes Historical Musab Wingard, MD  diphenhydrAMINE (BENADRYL) 25 MG tablet Take 1 tablet (25 mg total) by mouth every 6 (six) hours as needed for itching. 07/14/13   Nishant Dhungel, MD  esomeprazole (NEXIUM) 20 MG capsule Take 1 capsule (20 mg total) by mouth 2 (two) times daily before a meal. 07/14/13   Nishant Dhungel, MD  predniSONE (DELTASONE) 50 MG tablet Take 1 tablet (50 mg total) by mouth daily with breakfast. 07/14/13   Nishant Dhungel, MD   BP 131/60  Pulse 89  Temp(Src) 97.6 F (36.4 C) (Oral)  Resp 20  Ht 5\' 2"  (1.575 m)  Wt 154 lb 1.6 oz (69.9 kg)  BMI 28.18 kg/m2  SpO2 99% Physical Exam  Nursing note and vitals reviewed. Constitutional:  Awake, alert, nontoxic appearance.  HENT:  Head: Atraumatic.  Eyes: Right eye exhibits no discharge. Left eye exhibits no discharge.  Neck: Neck supple.  Cardiovascular: Normal rate and regular rhythm.   No murmur heard. Pulmonary/Chest: Effort normal and breath sounds normal. No respiratory distress. She has no wheezes. She has no rales. She exhibits no tenderness.  Abdominal: Soft. Bowel sounds are normal. She exhibits no distension. There is no tenderness. There is no rebound and no guarding.  Musculoskeletal: She exhibits no tenderness.  Baseline ROM, no obvious new focal weakness.  Neurological: She is alert.  Mental status and motor strength appears baseline for patient and situation.  Skin: No rash noted.  Psychiatric: She has a normal mood and affect.    ED Course  Procedures (including critical care time)  ECG d/w Pt's PCP office who recs Obs and states ECG changes are apparently new  c/w 2013. Since Pt with atypical CP spells at rest d-dimer ordered, since elevated CTA chest ordered.   Pt stable in ED with no significant deterioration in condition.  Patient / Family / Caregiver informed of clinical course, understand medical decision-making process, and agree with plan. [facility] paged for Obs. Labs Review Labs Reviewed  CBC - Abnormal; Notable for the following:    WBC 10.9 (*)    All other components within normal limits  BASIC METABOLIC PANEL - Abnormal; Notable for the following:    Glucose, Bld 105 (*)  GFR calc non Af Amer 66 (*)    GFR calc Af Amer 77 (*)    All other components within normal limits  D-DIMER, QUANTITATIVE - Abnormal; Notable for the following:    D-Dimer, Quant 0.86 (*)    All other components within normal limits  COMPREHENSIVE METABOLIC PANEL - Abnormal; Notable for the following:    Sodium 136 (*)    Glucose, Bld 235 (*)    Albumin 3.3 (*)    Total Bilirubin 0.2 (*)    GFR calc non Af Amer 69 (*)    GFR calc Af Amer 80 (*)    All other components within normal limits  CBC - Abnormal; Notable for the following:    WBC 17.5 (*)    RBC 3.72 (*)    Hemoglobin 11.7 (*)    HCT 35.0 (*)    All other components within normal limits  HEMOGLOBIN A1C - Abnormal; Notable for the following:    Hemoglobin A1C 5.9 (*)    Mean Plasma Glucose 123 (*)    All other components within normal limits  LIPID PANEL - Abnormal; Notable for the following:    HDL 37 (*)    All other components within normal limits  SURGICAL PCR SCREEN  TROPONIN I  TROPONIN I  TROPONIN I  I-STAT TROPOININ, ED  I-STAT TROPOININ, ED    Imaging Review Nm Myocar Multi W/spect W/wall Motion / Ef  07/14/2013   CLINICAL DATA:  Chest pain.  EXAM: MYOCARDIAL IMAGING WITH SPECT (REST AND PHARMACOLOGIC-STRESS)  GATED LEFT VENTRICULAR WALL MOTION STUDY  LEFT VENTRICULAR EJECTION FRACTION  TECHNIQUE: Standard myocardial SPECT imaging was performed after resting intravenous  injection of 10 mCi Tc-66m sestamibi. Subsequently, intravenous infusion of Lexiscan was performed under the supervision of the Cardiology staff. At peak effect of the drug, 30 mCi Tc-45m sestamibi was injected intravenously and standard myocardial SPECT imaging was performed. Quantitative gated imaging was also performed to evaluate left ventricular wall motion, and estimate left ventricular ejection fraction.  COMPARISON:  None.  FINDINGS: Exam quality is good. Myocardial SPECT shows uniform distribution of radiopharmaceutical activity through the left ventricular myocardium on both stress and rest images. No reversible or fixed myocardial perfusion defects are identified.  Left ventricular wall motion study shows no evidence of regional wall motion abnormality.  Estimated left ventricular ejection fraction is 80%.  IMPRESSION: No evidence of pharmacologic induced myocardial ischemia.  Normal left ventricular wall motion study, with calculated ejection fraction of 80%.   Electronically Signed   By: Earle Gell M.D.   On: 07/14/2013 17:39     EKG Interpretation   Date/Time:  Monday July 12 2013 15:16:09 EDT Ventricular Rate:  79 PR Interval:  152 QRS Duration: 88 QT Interval:  422 QTC Calculation: 483 R Axis:   39 Text Interpretation:  Normal sinus rhythm Low voltage QRS Septal infarct ,  age undetermined T wave abnormality, consider inferior ischemia T wave  abnormality Inferior leads Anterior leads Compared to previous tracing T  wave inversion less evident in Inferior leads Anterior leads Confirmed by  [facility]  MD, Jenny Reichmann ([id number]) on 07/12/2013 4:38:34 PM      MDM   Final diagnoses:  Chest pain, unspecified chest pain type    The patient appears reasonably stabilized for admission considering the current resources, flow, and capabilities available in the ED at this time, and I doubt any other [facility] requiring further screening and/or treatment in the ED prior to admission.  Babette Relic, MD 07/14/13 2230

## 2013-07-12 NOTE — ED Notes (Signed)
Pt brought over by radiology due to chest pain x a couple weeks, described as chest pressure that goes into back and up esophagus

## 2013-07-13 ENCOUNTER — Encounter (HOSPITAL_COMMUNITY): Payer: Self-pay | Admitting: Physician Assistant

## 2013-07-13 ENCOUNTER — Other Ambulatory Visit: Payer: Self-pay

## 2013-07-13 DIAGNOSIS — I519 Heart disease, unspecified: Secondary | ICD-10-CM

## 2013-07-13 DIAGNOSIS — IMO0001 Reserved for inherently not codable concepts without codable children: Secondary | ICD-10-CM

## 2013-07-13 DIAGNOSIS — R0789 Other chest pain: Secondary | ICD-10-CM

## 2013-07-13 DIAGNOSIS — K219 Gastro-esophageal reflux disease without esophagitis: Secondary | ICD-10-CM

## 2013-07-13 LAB — COMPREHENSIVE METABOLIC PANEL
ALT: 15 U/L (ref 0–35)
AST: 17 U/L (ref 0–37)
Albumin: 3.3 g/dL — ABNORMAL LOW (ref 3.5–5.2)
Alkaline Phosphatase: 77 U/L (ref 39–117)
BUN: 16 mg/dL (ref 6–23)
CALCIUM: 9.1 mg/dL (ref 8.4–10.5)
CO2: 21 meq/L (ref 19–32)
CREATININE: 0.88 mg/dL (ref 0.50–1.10)
Chloride: 102 mEq/L (ref 96–112)
GFR, EST AFRICAN AMERICAN: 80 mL/min — AB (ref >90)
GFR, EST NON AFRICAN AMERICAN: 69 mL/min — AB (ref >90)
Glucose, Bld: 235 mg/dL — ABNORMAL HIGH (ref 70–99)
Potassium: 4.3 mEq/L (ref 3.7–5.3)
Sodium: 136 mEq/L — ABNORMAL LOW (ref 137–147)
Total Bilirubin: 0.2 mg/dL — ABNORMAL LOW (ref 0.3–1.2)
Total Protein: 7.1 g/dL (ref 6.0–8.3)

## 2013-07-13 LAB — HEMOGLOBIN A1C
Hgb A1c MFr Bld: 5.9 % — ABNORMAL HIGH (ref <5.7)
MEAN PLASMA GLUCOSE: 123 mg/dL — AB (ref <117)

## 2013-07-13 LAB — CBC
HCT: 35 % — ABNORMAL LOW (ref 36.0–46.0)
HEMOGLOBIN: 11.7 g/dL — AB (ref 12.0–15.0)
MCH: 31.5 pg (ref 26.0–34.0)
MCHC: 33.4 g/dL (ref 30.0–36.0)
MCV: 94.1 fL (ref 78.0–100.0)
Platelets: 350 10*3/uL (ref 150–400)
RBC: 3.72 MIL/uL — ABNORMAL LOW (ref 3.87–5.11)
RDW: 12.6 % (ref 11.5–15.5)
WBC: 17.5 10*3/uL — ABNORMAL HIGH (ref 4.0–10.5)

## 2013-07-13 LAB — TROPONIN I: Troponin I: <0.3 ng/mL (ref <0.30)

## 2013-07-13 MED ORDER — FAMOTIDINE 20 MG PO TABS
20.0000 mg | ORAL_TABLET | Freq: Every day | ORAL | Status: DC
  Administered 2013-07-13 – 2013-07-14 (×2): 20 mg via ORAL
  Filled 2013-07-13 (×2): qty 1

## 2013-07-13 MED ORDER — ASPIRIN 81 MG PO CHEW
81.0000 mg | CHEWABLE_TABLET | Freq: Every day | ORAL | Status: DC
  Administered 2013-07-13 – 2013-07-14 (×2): 81 mg via ORAL
  Filled 2013-07-13 (×2): qty 1

## 2013-07-13 MED ORDER — DIPHENHYDRAMINE HCL 50 MG/ML IJ SOLN
25.0000 mg | Freq: Once | INTRAMUSCULAR | Status: AC
  Administered 2013-07-13: 25 mg via INTRAVENOUS
  Filled 2013-07-13: qty 1

## 2013-07-13 MED ORDER — METHYLPREDNISOLONE SODIUM SUCC 125 MG IJ SOLR
60.0000 mg | Freq: Every day | INTRAMUSCULAR | Status: DC
  Administered 2013-07-13 – 2013-07-14 (×2): 60 mg via INTRAVENOUS
  Filled 2013-07-13 (×2): qty 0.96

## 2013-07-13 MED ORDER — DIPHENHYDRAMINE HCL 50 MG/ML IJ SOLN
25.0000 mg | Freq: Four times a day (QID) | INTRAMUSCULAR | Status: DC | PRN
  Administered 2013-07-13 – 2013-07-14 (×3): 25 mg via INTRAVENOUS
  Filled 2013-07-13 (×2): qty 1

## 2013-07-13 NOTE — Progress Notes (Signed)
UR completed 

## 2013-07-13 NOTE — Progress Notes (Signed)
Echo Lab  2D Echocardiogram completed.  [place], [place] 07/13/2013 12:55 PM

## 2013-07-13 NOTE — Progress Notes (Signed)
Spoke with Larene Beach at nuclear medicine department at [facility] informed by Larene Beach to arrange [facility] to transport patient and to have patient over at nuclear medicine department by 0800 am on 7/1.  Writer called and arranged [facility] transport.  [facility] informed Probation officer transport will be here on 7/1 at 0730 am to pick up patient.

## 2013-07-13 NOTE — Consult Note (Signed)
Cardiology Consultation Note  Patient ID: [name], MRN: [id number], DOB/AGE: 03-26-1950 63 y.o. 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 She states on 5/26 she had the pneumonia shot in prep for her surgery coming up mid-July. A week later, she developed diffuse hives and arm swelling, followed by development of intermittent chest discomfort. She had an appointment yesterday for her pre-op physical where EKG was abnormal with no prior to compare to. She happened to mention she has had the intermittent chest pain, thus was referred to the ER. It is described as a squeezing sensation that lasts 30 seconds at a time without particular association with exertion. There is no rhyme or reason to when it happens. The pain starts in her chest and goes to her "esophagus and the roof of my mouth." Food and drink gets stuck on the way down since last night, including milk. She had similar CP several years ago and was treated with minocycline. She has had some SOB but no wheezing, increased pain on inspiration, fevers, tongue swelling, joint pain. She does have a family history of CAD in father and sister. Troponins are negative. She is being treated with IV steroids for her rash. The IV steroids were also given for contrast allergy -  CT angio negative for PE but did show pulm nodules. WBC is 17k and sugar is up, possibly due to this.   Past Medical History  Diagnosis Date  . Complication of anesthesia     NAUSEA  . Chest pain     pt states due to esophageal problem -  primary care MD aware   . Hives     since pneumonia vaccine     . Asthma   . Depression   . Anxiety   . Fibromyalgia   . Spondylosis   . Bulging disc   . 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 Phantom limb pain     rt leg  . Leg pain, left   . Eczema     ears  . GERD (gastroesophageal reflux disease)     " alot of burping"  . Constipation, chronic   . Urgency incontinence   . History of transfusion   . Cancer     hx sarcoma rt knee with amputation  . Difficulty sleeping       Most Recent Cardiac Studies: None   Surgical History:  Past Surgical History  Procedure Laterality Date  . Abdominal hysterectomy  1980'S  . Sarcoma  1973    RT KNEE (aka)  . External ear surgery  AGE 63     Home Meds: Prior to Admission medications   Medication Sig Start Date End Date Taking? Authorizing Provider  albuterol (PROVENTIL HFA;VENTOLIN HFA) 108 (90 BASE) MCG/ACT inhaler Inhale 2 puffs into the lungs every 6 (six) hours as needed for wheezing or shortness of breath.   Yes Historical Provider, MD  buPROPion (WELLBUTRIN SR) 150 MG 12 hr tablet Take 150 mg by mouth 2 (two) times daily.   Yes Historical Provider, MD  busPIRone (BUSPAR) 10 MG tablet Take 10 mg  by mouth 2 (two) times daily.   Yes Historical Provider, MD  Cholecalciferol (VITAMIN D) 2000 UNITS tablet Take 2,000 Units by mouth daily.   Yes Historical Provider, MD  citalopram (CELEXA) 10 MG tablet Take 10 mg by mouth every morning.   Yes Historical Provider, MD  cyclobenzaprine (FLEXERIL) 10 MG tablet Take 10 mg by mouth 2 (two) times daily.    Yes Historical Provider, MD  diphenhydrAMINE (BENADRYL) 25 MG tablet Take 25 mg by mouth every 6 (six) hours as needed for itching.   Yes Historical Provider, MD  esomeprazole (NEXIUM) 20 MG capsule Take 20 mg by mouth daily at 12 noon.   Yes Historical Provider, MD  fexofenadine (ALLEGRA) 180 MG tablet Take 180 mg by mouth daily.   Yes Historical Provider, MD  fluocinolone (SYNALAR) 0.01 % external solution Place 2 drops into both ears 2  (two) times daily as needed (itching).   Yes Historical Provider, MD  ibuprofen (ADVIL,MOTRIN) 200 MG tablet Take 600 mg by mouth every 6 (six) hours as needed for moderate pain.   Yes Historical Provider, MD  irbesartan (AVAPRO) 150 MG tablet Take 150 mg by mouth 2 (two) times daily.   Yes Historical Provider, MD    Inpatient Medications:  . buPROPion  150 mg Oral BID  . busPIRone  10 mg Oral BID  . citalopram  10 mg Oral q morning - 10a  . cyclobenzaprine  10 mg Oral BID  . heparin  5,000 Units Subcutaneous 3 times per day  . irbesartan  150 mg Oral BID  . pantoprazole  40 mg Oral Daily  . sodium chloride  3 mL Intravenous Q12H      Allergies:  Allergies  Allergen Reactions  . Bactrim [Sulfamethoxazole-Tmp Ds] Shortness Of Breath  . Cefuroxime Shortness Of Breath  . Pneumococcal Vaccines Hives    Hives on arm after shot  . Omnipaque [Iohexol]     Patient stated that she had a reaction a few years ago to the IV dye and had trouble breathing--so for her CT Angio Chest today (07/12/13), ER MD gave her solumedrol and Benadryl before she had test and did very well with that.  ak 07/12/13  . Penicillins Diarrhea  . Sulfa Antibiotics Other (See Comments)    unknown    History   Social History  . Marital Status: Married    Spouse Name: N/A    Number of Children: N/A  . Years of Education: N/A   Occupational History  . Not on file.   Social History Main Topics  . Smoking status: Former Smoker    Quit date: 07/13/2007  . Smokeless tobacco: Not on file  . Alcohol Use: No  . Drug Use: No  . Sexual Activity: Not on file   Other Topics Concern  . Not on file   Social History Narrative  . No narrative on file     Family History  Problem Relation Age of Onset  . CAD Sister     CABG, stents     Review of Systems:see above All other systems reviewed and are otherwise negative except as noted above.  Labs:  Recent Labs  07/12/13 2101 07/13/13 0230 07/13/13 0934    TROPONINI <0.30 <0.30 <0.30   Lab Results  Component Value Date   WBC 17.5* 07/13/2013   HGB 11.7* 07/13/2013   HCT 35.0* 07/13/2013   MCV 94.1 07/13/2013   PLT 350 07/13/2013     Recent Labs Lab 07/13/13 0230  NA 136*  K 4.3  CL 102  CO2 21  BUN 16  CREATININE 0.88  CALCIUM 9.1  PROT 7.1  BILITOT 0.2*  ALKPHOS 77  ALT 15  AST 17  GLUCOSE 235*    Lab Results  Component Value Date   DDIMER 0.86* 07/12/2013    Radiology/Studies:  Dg Chest 2 View  07/12/2013   CLINICAL DATA:  PREOP L TOTAL HIP / HX HBP  EXAM: CHEST  2 VIEW  COMPARISON:  None.  FINDINGS: The heart size and mediastinal contours are within normal limits. Both lungs are clear. The visualized skeletal structures are unremarkable. Minimal discoid atelectasis versus scarring in the lung bases.  IMPRESSION: No active cardiopulmonary disease.   Electronically Signed   By: Margaree Mackintosh M.D.   On: 07/12/2013 13:16   Dg Hip Complete Left  07/12/2013   CLINICAL DATA:  Left hip osteoarthritis.  EXAM: LEFT HIP - COMPLETE 2+ VIEW  COMPARISON:  None.  FINDINGS: Severe narrowing of the superior portion of the left hip joint is noted consistent with osteoarthritis. No fracture or dislocation is noted.  IMPRESSION: Severe osteoarthritis of left hip is noted.   Electronically Signed   By: Sabino Dick M.D.   On: 07/12/2013 13:16   Ct Angio Chest Pe W/cm &/or Wo Cm  07/12/2013   CLINICAL DATA:  Elevated D-dimers, chest pain  EXAM: CT ANGIOGRAPHY CHEST WITH CONTRAST  TECHNIQUE: Multidetector CT imaging of the chest was performed using the standard protocol during bolus administration of intravenous contrast. Multiplanar CT image reconstructions and MIPs were obtained to evaluate the vascular anatomy.  CONTRAST:  152mL OMNIPAQUE IOHEXOL 350 MG/ML SOLN  COMPARISON:  Chest x-ray July 12, 2013  FINDINGS: There is no pulmonary embolus. There is no mediastinal or hilar lymphadenopathy. There is no pulmonary edema or pleural effusion. There  is a 2 mm nodule in the left upper lobe image 16. There is a 1.5 cm nodular density in the posterior basal segment of the right lower lobe image 61. The heart size is normal. There is no pericardial effusion. The visualized upper abdominal structures are unremarkable. There is a minimal hiatal hernia. Degenerative joint changes of the spine are noted.  Review of the MIP images confirms the above findings.  IMPRESSION: No pulmonary embolus. No acute abnormality identified within the chest.  Pulmonary nodules as described. The largest nodule measures 1.5 cm in the right lower lobe. Further evaluation with PET CT is recommended.   Electronically Signed   By: Abelardo Diesel M.D.   On: 07/12/2013 17:26   EKG: NSR with ST-T changes inferiorly and V3-V6, nonspecific ST upsloping V2  Physical Exam: Blood pressure 121/62, pulse 90, temperature 98.1 F (36.7 C), temperature source Oral, resp. rate 18, height 5\' 2"  (1.575 m), weight 154 lb 1.6 oz (69.9 kg), SpO2 99.00%. General: Well developed WF in no acute distress. Skin with diffuse erythematous plaques/welts on arms, back, buttocks Head: Normocephalic, atraumatic, sclera non-icteric, no xanthomas, nares are without discharge.  Neck:  JVD not elevated. Lungs: Clear bilaterally to auscultation without wheezes, rales, or rhonchi. Breathing is unlabored. No indication of respiratory involvement. Heart: RRR with S1 S2. No murmurs, rubs, or gallops appreciated. Abdomen: Soft, non-tender, non-distended with normoactive bowel sounds. No hepatomegaly. No rebound/guarding. No obvious abdominal masses. Msk:  Strength and tone appear normal for age. Extremities: No clubbing or cyanosis. No edema.  Distal pedal pulses are 2+ and equal bilaterally. Neuro: Alert and oriented X 3. No facial asymmetry.  No focal deficit. Moves all extremities spontaneously. Psych:  Responds to questions appropriately with a normal affect.   Assessment and Plan:   1. Diffuse rash 2. Chest  pain, somewhat atypical 3. Osteoarthritis of hip, pending upcoming surgery 07/2013 4. GERD 5. Leukocytosis and hyperglycemia suspected due to steroids 6. Pulm nodules on CT  Patient seen/examined together with Dr. Harrington Challenger. Plan 2D echocardiogram and Lexiscan nuc in AM. Symptoms are somewhat atypical but does have family history of CAD and abnormal EKG. It is curious that symptoms coincide with onset of allergic reaction symptoms. She is also noting sense of dysphagia. ? Esophageal involvement. Will add H2 blocker. Consider course of empiric steroids for rash. Add daily aspirin - received in ER yesterday. Will check lipid panel in AM. Will defer further workup of pulmonary nodules on CT to internal medicine. Pt is aware of this CT finding. See below for additional thoughts.  Signed, Melina Copa PA-C 07/13/2013, 4:04 PM  Patient seen and examined  Called to see re abnormal EKG and CP Patients pain is atypical  Squeezing  Lasts aabout 30 sec.  Not assocaited with activity.  Symptoms began about 2 wks ago  INteresting that timing is consistent with onset of welts, rash (felt allergic Rxn)  Patient has also noted some diffiuclty swallowing. ON exam:  Skin with welts on arm, back, legs.   EKG shows SR with tr ST depression, T wave inversion inferor and laterally  No old beyond yesterday  I am not convinced the patient's symtpoms represent coronary ischemia.  Question if she has GI involvement with allergic RXN  Recomm:  WOuld r/o for MI  Would recomm echo  Would recomm lexiscan myoview.   Rx with H2 blocker  Would also recomm steroids empirically  She got one dose yesterday prior to CT scan    Lipids in am Glucose probably elevated from steroids she received yesterday.    Dorris Carnes

## 2013-07-13 NOTE — Progress Notes (Signed)
[facility] PROGRESS NOTE  [name] TZG:[id number] DOB: [birth year] DOA: 07/12/2013 PCP: Shary Key, MD  Assessment/Plan: 1. Chest pain  1. Atypical in nature, although pt reports sister had identical sx when she was diagnosed with CAD, resulting in 5 vessel CABG 2. Cardiac enzymes serially neg 3. 2d echo to rule out WMA, pending results 4. Cont with analgesics as tolerated 5. Repeat EKG with persistent Twave inverstions, no old EKG's to compare to 6. Given strong family hx of heart disease and risk factors, will consult Cardiology for assistance 2. GERD  1. Cont with PPI as tolerated 3. Osteoarthritis of the hip  1. Followed by Ortho - pending outpatient hip replacement 4. Fibromyalgia  1. Stable 2. Cont analgesics 5. DVT prophylaxis  1. Heparin subQ  Code Status: Full Family Communication: Pt in room, family at bedside (indicate person spoken with, relationship, and if by phone, the number) Disposition Plan: Pending   Consultants:  Cardiology  Procedures:    Antibiotics:   (indicate start date, and stop date if known)  HPI/Subjective: Continues to have intermittent chest pains  Objective: Filed Vitals:   07/12/13 1830 07/12/13 2043 07/13/13 0547 07/13/13 1408  BP: 135/72 158/71 113/60 121/62  Pulse: 80 90 88 90  Temp:  99.2 F (37.3 C) 98.2 F (36.8 C)   TempSrc:  Oral Oral   Resp: 13 20 18 18   Height:  5\' 2"  (1.575 m)    Weight:  154 lb 1.6 oz (69.9 kg)    SpO2: 100% 95% 96% 99%    Intake/Output Summary (Last 24 hours) at 07/13/13 1458 Last data filed at 07/13/13 1407  Gross per 24 hour  Intake    600 ml  Output      1 ml  Net    599 ml   Filed Weights   07/12/13 2043  Weight: 154 lb 1.6 oz (69.9 kg)    Exam:   General:  Awake, in nad  Cardiovascular: regular, s1, s2  Respiratory: normal resp effort, no wheezing  Abdomen: soft,nondistneded  Musculoskeletal: perfused, no clubbing   Data Reviewed: Basic Metabolic  Panel:  Recent Labs Lab 07/12/13 1005 07/12/13 1302 07/13/13 0230  NA 142 141 136*  K 4.4 4.3 4.3  CL 105 105 102  CO2 24 24 21   GLUCOSE 122* 105* 235*  BUN 11 12 16   CREATININE 0.92 0.91 0.88  CALCIUM 9.1 9.1 9.1   Liver Function Tests:  Recent Labs Lab 07/12/13 1005 07/13/13 0230  AST 22 17  ALT 16 15  ALKPHOS 84 77  BILITOT 0.3 0.2*  PROT 7.7 7.1  ALBUMIN 3.7 3.3*   No results found for this basename: LIPASE, AMYLASE,  in the last 168 hours No results found for this basename: AMMONIA,  in the last 168 hours CBC:  Recent Labs Lab 07/12/13 1005 07/12/13 1302 07/13/13 0230  WBC 11.2* 10.9* 17.5*  HGB 12.2 12.3 11.7*  HCT 36.7 37.5 35.0*  MCV 95.1 94.7 94.1  PLT 364 385 350   Cardiac Enzymes:  Recent Labs Lab 07/12/13 2101 07/13/13 0230 07/13/13 0934  TROPONINI <0.30 <0.30 <0.30   BNP (last 3 results) No results found for this basename: PROBNP,  in the last 8760 hours CBG: No results found for this basename: GLUCAP,  in the last 168 hours  Recent Results (from the past 240 hour(s))  SURGICAL PCR SCREEN     Status: None   Collection Time    07/12/13  7:00 PM  Result Value Ref Range Status   MRSA, PCR NEGATIVE  NEGATIVE Final   Staphylococcus aureus NEGATIVE  NEGATIVE Final   Comment:            The Xpert SA Assay (FDA     approved for NASAL specimens     in patients over 63 years of age),     is one component of     a comprehensive surveillance     program.  Test performance has     been validated by [employer] for patients greater     than or equal to 63 year old.     It is not intended     to diagnose infection nor to     guide or monitor treatment.     Studies: Dg Chest 2 View  07/12/2013   CLINICAL DATA:  PREOP L TOTAL HIP / HX HBP  EXAM: CHEST  2 VIEW  COMPARISON:  None.  FINDINGS: The heart size and mediastinal contours are within normal limits. Both lungs are clear. The visualized skeletal structures are unremarkable.  Minimal discoid atelectasis versus scarring in the lung bases.  IMPRESSION: No active cardiopulmonary disease.   Electronically Signed   By: Margaree Mackintosh M.D.   On: 07/12/2013 13:16   Dg Hip Complete Left  07/12/2013   CLINICAL DATA:  Left hip osteoarthritis.  EXAM: LEFT HIP - COMPLETE 2+ VIEW  COMPARISON:  None.  FINDINGS: Severe narrowing of the superior portion of the left hip joint is noted consistent with osteoarthritis. No fracture or dislocation is noted.  IMPRESSION: Severe osteoarthritis of left hip is noted.   Electronically Signed   By: Sabino Dick M.D.   On: 07/12/2013 13:16   Ct Angio Chest Pe W/cm &/or Wo Cm  07/12/2013   CLINICAL DATA:  Elevated D-dimers, chest pain  EXAM: CT ANGIOGRAPHY CHEST WITH CONTRAST  TECHNIQUE: Multidetector CT imaging of the chest was performed using the standard protocol during bolus administration of intravenous contrast. Multiplanar CT image reconstructions and MIPs were obtained to evaluate the vascular anatomy.  CONTRAST:  153mL OMNIPAQUE IOHEXOL 350 MG/ML SOLN  COMPARISON:  Chest x-ray July 12, 2013  FINDINGS: There is no pulmonary embolus. There is no mediastinal or hilar lymphadenopathy. There is no pulmonary edema or pleural effusion. There is a 2 mm nodule in the left upper lobe image 16. There is a 1.5 cm nodular density in the posterior basal segment of the right lower lobe image 61. The heart size is normal. There is no pericardial effusion. The visualized upper abdominal structures are unremarkable. There is a minimal hiatal hernia. Degenerative joint changes of the spine are noted.  Review of the MIP images confirms the above findings.  IMPRESSION: No pulmonary embolus. No acute abnormality identified within the chest.  Pulmonary nodules as described. The largest nodule measures 1.5 cm in the right lower lobe. Further evaluation with PET CT is recommended.   Electronically Signed   By: Abelardo Diesel M.D.   On: 07/12/2013 17:26    Scheduled Meds: .  buPROPion  150 mg Oral BID  . busPIRone  10 mg Oral BID  . citalopram  10 mg Oral q morning - 10a  . cyclobenzaprine  10 mg Oral BID  . heparin  5,000 Units Subcutaneous 3 times per day  . irbesartan  150 mg Oral BID  . pantoprazole  40 mg Oral Daily  . sodium chloride  3 mL Intravenous Q12H  Continuous Infusions:   Active Problems:   Chest pain   Chest pain of uncertain etiology   Fibromyalgia   GERD (gastroesophageal reflux disease)   Hiatal hernia  Time spent: 86min  [facility], [place] [facility] Pager [phone]. If 7PM-7AM, please contact night-coverage at [email or web address], password [facility] 07/13/2013, 2:58 PM  LOS: 1 day

## 2013-07-14 ENCOUNTER — Encounter (HOSPITAL_COMMUNITY): Payer: Self-pay | Admitting: *Deleted

## 2013-07-14 ENCOUNTER — Ambulatory Visit (HOSPITAL_COMMUNITY)
Admit: 2013-07-14 | Discharge: 2013-07-14 | Disposition: A | Discharge status: Home / Self Care | Payer: BC Managed Care – PPO

## 2013-07-14 ENCOUNTER — Ambulatory Visit (HOSPITAL_COMMUNITY)
Admit: 2013-07-14 | Discharge: 2013-07-14 | Disposition: A | Discharge status: Home / Self Care | Payer: BC Managed Care – PPO | Attending: Family Medicine | Admitting: Family Medicine

## 2013-07-14 ENCOUNTER — Other Ambulatory Visit: Payer: Self-pay

## 2013-07-14 DIAGNOSIS — R079 Chest pain, unspecified: Secondary | ICD-10-CM

## 2013-07-14 DIAGNOSIS — I200 Unstable angina: Secondary | ICD-10-CM

## 2013-07-14 LAB — LIPID PANEL
CHOL/HDL RATIO: 4.1 ratio
Cholesterol: 153 mg/dL (ref 0–200)
HDL: 37 mg/dL — AB (ref >39)
LDL Cholesterol: 89 mg/dL (ref 0–99)
Triglycerides: 135 mg/dL (ref <150)
VLDL: 27 mg/dL (ref 0–40)

## 2013-07-14 MED ORDER — ESOMEPRAZOLE MAGNESIUM 20 MG PO CPDR: 20.0000 mg | DELAYED_RELEASE_CAPSULE | Freq: Two times a day (BID) | ORAL | Status: AC

## 2013-07-14 MED ORDER — REGADENOSON 0.4 MG/5ML IV SOLN
INTRAVENOUS | Status: AC
  Filled 2013-07-14: qty 5

## 2013-07-14 MED ORDER — TECHNETIUM TC 99M SESTAMIBI GENERIC - CARDIOLITE
30.0000 | Freq: Once | INTRAVENOUS | Status: AC | PRN
  Administered 2013-07-14: 30 via INTRAVENOUS

## 2013-07-14 MED ORDER — PREDNISONE 50 MG PO TABS: 50.0000 mg | ORAL_TABLET | Freq: Every day | ORAL | Status: DC

## 2013-07-14 MED ORDER — DIPHENHYDRAMINE HCL 25 MG PO TABS: 25.0000 mg | ORAL_TABLET | Freq: Four times a day (QID) | ORAL | Status: AC | PRN

## 2013-07-14 MED ORDER — REGADENOSON 0.4 MG/5ML IV SOLN
0.4000 mg | Freq: Once | INTRAVENOUS | Status: AC
  Administered 2013-07-14: 0.4 mg via INTRAVENOUS

## 2013-07-14 MED ORDER — SODIUM CHLORIDE 0.9 % IJ SOLN
80.0000 mg | INTRAVENOUS | Status: AC
  Administered 2013-07-14: 80 mg via INTRAVENOUS
  Filled 2013-07-14: qty 3.2

## 2013-07-14 MED ORDER — TECHNETIUM TC 99M SESTAMIBI GENERIC - CARDIOLITE
10.0000 | Freq: Once | INTRAVENOUS | Status: AC | PRN
  Administered 2013-07-14: 10 via INTRAVENOUS

## 2013-07-14 NOTE — Progress Notes (Signed)
lexiscan myoview completed without complications  

## 2013-07-14 NOTE — Progress Notes (Signed)
Cardiology Consultation Note  Patient ID: [name], MRN: [id number], DOB/AGE: 63-Feb-1952 63 y.o. 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 She states on 5/26 she had the pneumonia shot in prep for her surgery coming up mid-July. A week later, she developed diffuse hives and arm swelling, followed by development of intermittent chest discomfort. She had an appointment yesterday for her pre-op physical where EKG was abnormal with no prior to compare to. She happened to mention she has had the intermittent chest pain, thus was referred to the ER. It is described as a squeezing sensation that lasts 30 seconds at a time without particular association with exertion. There is no rhyme or reason to when it happens. The pain starts in her chest and goes to her "esophagus and the roof of my mouth." Food and drink gets stuck on the way down since last night, including milk. She had similar CP several years ago and was treated with minocycline. She has had some SOB but no wheezing, increased pain on inspiration, fevers, tongue swelling, joint pain. She does have a family history of CAD in father and sister. Troponins are negative. She is being treated with IV steroids for her rash. The IV steroids were also given for contrast allergy -  CT angio negative for PE but did show pulm nodules. WBC is 17k and sugar is up, possibly due to this.   Past Medical History  Diagnosis Date  . Complication of anesthesia     NAUSEA  . Chest pain     pt states due to esophageal problem -  primary care MD aware   . Hives     since pneumonia vaccine     . Asthma   . Depression   . Anxiety   . Fibromyalgia   . Spondylosis   . Bulging disc   . 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 Phantom limb pain     rt leg  . Leg pain, left   . Eczema     ears  . GERD (gastroesophageal reflux disease)     " alot of burping"  . Constipation, chronic   . Urgency incontinence   . History of transfusion   . Cancer     hx sarcoma rt knee with amputation  . Difficulty sleeping       Most Recent Cardiac Studies: None   Surgical History:  Past Surgical History  Procedure Laterality Date  . Abdominal hysterectomy  1980'S  . Sarcoma  1973    RT KNEE (aka)  . External ear surgery  AGE 26     Home Meds: Prior to Admission medications   Medication Sig Start Date End Date Taking? Authorizing Provider  albuterol (PROVENTIL HFA;VENTOLIN HFA) 108 (90 BASE) MCG/ACT inhaler Inhale 2 puffs into the lungs every 6 (six) hours as needed for wheezing or shortness of breath.   Yes Historical Provider, MD  buPROPion (WELLBUTRIN SR) 150 MG 12 hr tablet Take 150 mg by mouth 2 (two) times daily.   Yes Historical Provider, MD  busPIRone (BUSPAR) 10 MG tablet Take 10 mg  by mouth 2 (two) times daily.   Yes Historical Provider, MD  Cholecalciferol (VITAMIN D) 2000 UNITS tablet Take 2,000 Units by mouth daily.   Yes Historical Provider, MD  citalopram (CELEXA) 10 MG tablet Take 10 mg by mouth every morning.   Yes Historical Provider, MD  cyclobenzaprine (FLEXERIL) 10 MG tablet Take 10 mg by mouth 2 (two) times daily.    Yes Historical Provider, MD  diphenhydrAMINE (BENADRYL) 25 MG tablet Take 25 mg by mouth every 6 (six) hours as needed for itching.   Yes Historical Provider, MD  esomeprazole (NEXIUM) 20 MG capsule Take 20 mg by mouth daily at 12 noon.   Yes Historical Provider, MD  fexofenadine (ALLEGRA) 180 MG tablet Take 180 mg by mouth daily.   Yes Historical Provider, MD  fluocinolone (SYNALAR) 0.01 % external solution Place 2 drops into both ears 2  (two) times daily as needed (itching).   Yes Historical Provider, MD  ibuprofen (ADVIL,MOTRIN) 200 MG tablet Take 600 mg by mouth every 6 (six) hours as needed for moderate pain.   Yes Historical Provider, MD  irbesartan (AVAPRO) 150 MG tablet Take 150 mg by mouth 2 (two) times daily.   Yes Historical Provider, MD    Inpatient Medications:  . aspirin  81 mg Oral Daily  . buPROPion  150 mg Oral BID  . busPIRone  10 mg Oral BID  . citalopram  10 mg Oral q morning - 10a  . cyclobenzaprine  10 mg Oral BID  . famotidine  20 mg Oral Daily  . heparin  5,000 Units Subcutaneous 3 times per day  . irbesartan  150 mg Oral BID  . methylPREDNISolone (SOLU-MEDROL) injection  60 mg Intravenous Daily  . pantoprazole  40 mg Oral Daily  . sodium chloride  3 mL Intravenous Q12H      Allergies:  Allergies  Allergen Reactions  . Bactrim [Sulfamethoxazole-Tmp Ds] Shortness Of Breath  . Cefuroxime Shortness Of Breath  . Pneumococcal Vaccines Hives    Hives on arm after shot  . Omnipaque [Iohexol]     Patient stated that she had a reaction a few years ago to the IV dye and had trouble breathing--so for her CT Angio Chest today (07/12/13), ER MD gave her solumedrol and Benadryl before she had test and did very well with that.  ak 07/12/13  . Penicillins Diarrhea  . Sulfa Antibiotics Other (See Comments)    unknown    History   Social History  . Marital Status: Married    Spouse Name: N/A    Number of Children: N/A  . Years of Education: N/A   Occupational History  . Not on file.   Social History Main Topics  . Smoking status: Former Smoker    Quit date: 07/13/2007  . Smokeless tobacco: Not on file  . Alcohol Use: No  . Drug Use: No  . Sexual Activity: Not on file   Other Topics Concern  . Not on file   Social History Narrative  . No narrative on file     Family History  Problem Relation Age of Onset  . CAD Sister     CABG, stents  . CAD Father      Review of Systems:see  above All other systems reviewed and are otherwise negative except as noted above.  Labs:  Recent Labs  07/12/13 2101 07/13/13 0230 07/13/13 0934  TROPONINI <0.30 <0.30 <0.30   Lab Results  Component Value Date   WBC 17.5*  07/13/2013   HGB 11.7* 07/13/2013   HCT 35.0* 07/13/2013   MCV 94.1 07/13/2013   PLT 350 07/13/2013     Recent Labs Lab 07/13/13 0230  NA 136*  K 4.3  CL 102  CO2 21  BUN 16  CREATININE 0.88  CALCIUM 9.1  PROT 7.1  BILITOT 0.2*  ALKPHOS 77  ALT 15  AST 17  GLUCOSE 235*    Lab Results  Component Value Date   DDIMER 0.86* 07/12/2013    Radiology/Studies:  Dg Chest 2 View  07/12/2013   CLINICAL DATA:  PREOP L TOTAL HIP / HX HBP  EXAM: CHEST  2 VIEW  COMPARISON:  None.  FINDINGS: The heart size and mediastinal contours are within normal limits. Both lungs are clear. The visualized skeletal structures are unremarkable. Minimal discoid atelectasis versus scarring in the lung bases.  IMPRESSION: No active cardiopulmonary disease.   Electronically Signed   By: Margaree Mackintosh M.D.   On: 07/12/2013 13:16   Dg Hip Complete Left  07/12/2013   CLINICAL DATA:  Left hip osteoarthritis.  EXAM: LEFT HIP - COMPLETE 2+ VIEW  COMPARISON:  None.  FINDINGS: Severe narrowing of the superior portion of the left hip joint is noted consistent with osteoarthritis. No fracture or dislocation is noted.  IMPRESSION: Severe osteoarthritis of left hip is noted.   Electronically Signed   By: Sabino Dick M.D.   On: 07/12/2013 13:16   Ct Angio Chest Pe W/cm &/or Wo Cm  07/12/2013   CLINICAL DATA:  Elevated D-dimers, chest pain  EXAM: CT ANGIOGRAPHY CHEST WITH CONTRAST  TECHNIQUE: Multidetector CT imaging of the chest was performed using the standard protocol during bolus administration of intravenous contrast. Multiplanar CT image reconstructions and MIPs were obtained to evaluate the vascular anatomy.  CONTRAST:  1108mL OMNIPAQUE IOHEXOL 350 MG/ML SOLN  COMPARISON:  Chest x-ray July 12, 2013  FINDINGS: There is no pulmonary embolus. There is no mediastinal or hilar lymphadenopathy. There is no pulmonary edema or pleural effusion. There is a 2 mm nodule in the left upper lobe image 16. There is a 1.5 cm nodular density in the posterior basal segment of the right lower lobe image 61. The heart size is normal. There is no pericardial effusion. The visualized upper abdominal structures are unremarkable. There is a minimal hiatal hernia. Degenerative joint changes of the spine are noted.  Review of the MIP images confirms the above findings.  IMPRESSION: No pulmonary embolus. No acute abnormality identified within the chest.  Pulmonary nodules as described. The largest nodule measures 1.5 cm in the right lower lobe. Further evaluation with PET CT is recommended.   Electronically Signed   By: Abelardo Diesel M.D.   On: 07/12/2013 17:26   EKG: NSR with ST-T changes inferiorly and V3-V6, nonspecific ST upsloping V2  Physical Exam: Blood pressure 112/62, pulse 87, temperature 98.2 F (36.8 C), temperature source Oral, resp. rate 18, height 5\' 2"  (1.575 m), weight 154 lb 1.6 oz (69.9 kg), SpO2 97.00%. General: Well developed WF in no acute distress. Skin with diffuse erythematous plaques/welts on arms, back, buttocks Head: Normocephalic, atraumatic, sclera non-icteric, no xanthomas, nares are without discharge.  Neck:  JVD not elevated. Lungs: Clear bilaterally to auscultation without wheezes, rales, or rhonchi. Breathing is unlabored. No indication of respiratory involvement. Heart: RRR with S1 S2. No murmurs, rubs, or gallops appreciated. Abdomen: Soft, non-tender, non-distended with normoactive bowel sounds. No hepatomegaly. No rebound/guarding. No obvious abdominal masses. Msk:  Strength  and tone appear normal for age. Extremities: No clubbing or cyanosis. S/p Right AKA  Neuro: Alert and oriented X 3. No facial asymmetry. No focal deficit. Moves all extremities spontaneously. Psych:   Responds to questions appropriately with a normal affect.   Assessment and Plan:   1. Chest pressure:   She has a strong family hx of [premature CAD.  She is limited by her orthopedic issues and is not able to exercise .  She has worrisome chest pressure that radiates up to her throat with rest.  Lasts about 30 seconds.    Her ECG shows TWI in the anterior leads.  She is going for [employer] today.  I would have a low threshold to cath her.     Thayer Headings, Brooke Bonito., MD, [facility] 07/14/2013, 8:56 AM [address]  [place] Pager [phone]

## 2013-07-14 NOTE — Progress Notes (Signed)
Discharge to home, husband at bedside. No complaints of any pain or discomfort. PIV removed by NT no s/s of infiltration or swelling noted. D/c instructions and follow up appointments done , verbalized understanding.

## 2013-07-14 NOTE — Discharge Summary (Signed)
Physician Discharge Summary  [name] QJJ:[id number] DOB: 04/25/1950 DOA: 07/12/2013  PCP: Shary Key, MD  Admit date: 07/12/2013 Discharge date: 07/14/2013  Time spent: 25 minutes  Recommendations for Outpatient Follow-up:  Discharge home with outpt PCP follow up in 1 week  Discharge Diagnoses:  Principle Problem:   Chest pain of uncertain etiology, likely related to GERD  Active problem   Fibromyalgia   GERD (gastroesophageal reflux disease)   Hiatal hernia   Discharge Condition: fair  Diet recommendation: Regular  Family communication: Husband at bedside  [facility] Weights   07/12/13 2043  Weight: 69.9 kg (154 lb 1.6 oz)    History of present illness:  63 year-old female with hx of GERD , hiatal hernia, osteoarthritis of the hips awaiting outpt hip replacement was referred to ED secondary to complaints of recent chest pain as well as a questionably abnormal EKG during pre-op cardiac work up. In the ED, cardiac enzymes neg x 1. D-dimer was mildly elevated with f/u CTA chest neg for PE. Hospitalist was consulted for consideration for admission . On further questioning, pt reports chest pain that begins in the epigastric region and radiates up the throat.  DenieD nausea, diaphoresis, or SOB.    Hospital Course:  Chest pain Appears to be atypical in nature likely associated with the GERD. Patient reported her sister to have identical symptoms and was diagnosed with CAD requiring CABG. Cardiac enzymes have been negative. 2-D echo with a normal EF and no wall motion abnormalities. Showed  Grade 1   diastolic dysfunction. reported chest discomfort this morning. Seen by cardiology consult and showed lexiscan myoview done today which was negative for ischemia and normal left ventricular wall motion with estimated EF of 80%. Lipid panel normal -pt  is clinically stable and has not had further symptoms. Her chest pain symptoms are likely related to GERD. She is on Nexium and  i will double her dose to 20 mg bid for one month and have her follow up with PCP. If symptoms persistent , will need  outpatient referral to GI.  GERD increased nexium dose. Patient counseled to avoid NSAIDs.  Fibromyalgia stable  Hypertension Continue Avapro  Rash over extremity patient reports having a rash over left arm after receiving pneumovax about 10 days back and had spread to her legs and back and  Reports to have worsened after receiving contrast for CT scan on admission. patient placed on IV Solu-Medrol and when necessary Benadryl with some improvement. I will discharge her on oral prednisone for 5 more days and as needed Benadryl. She follow up with her PCP.  Osteoarthritis of left hip  has surgery scheduled for 7/15. Given negative stress test, assume cardiac clearance.  Procedures:  lexiscan myoview   2D echo  Consultations:  Cardiology    Discharge Exam: Filed Vitals:   07/14/13 1653  BP: 131/60  Pulse: 89  Temp: 97.6 F (36.4 C)  Resp: 20    General: Elderly female in no acute distress HEENT: No pallor, moist oral mucosa Chest: Clear to auscultation bilaterally, no added sounds CVS: Normal S1-S2, no murmurs, rubs or gallop Abdomen: Soft, nontender, nondistended, bowel sounds present Extremities: warm no edema, right AKA CNS: AAO x3   Discharge Instructions You were cared for by a hospitalist during your hospital stay. If you have any questions about your discharge medications or the care you received while you were in the hospital after you are discharged, you can call the unit and asked to speak with  the hospitalist on call if the hospitalist that took care of you is not available. Once you are discharged, your primary care physician will handle any further medical issues. Please note that NO REFILLS for any discharge medications will be authorized once you are discharged, as it is imperative that you return to your primary care physician (or  establish a relationship with a primary care physician if you do not have one) for your aftercare needs so that they can reassess your need for medications and monitor your lab values.     Medication List    STOP taking these medications       ibuprofen 200 MG tablet  Commonly known as:  ADVIL,MOTRIN      TAKE these medications       albuterol 108 (90 BASE) MCG/ACT inhaler  Commonly known as:  PROVENTIL HFA;VENTOLIN HFA  Inhale 2 puffs into the lungs every 6 (six) hours as needed for wheezing or shortness of breath.     buPROPion 150 MG 12 hr tablet  Commonly known as:  WELLBUTRIN SR  Take 150 mg by mouth 2 (two) times daily.     busPIRone 10 MG tablet  Commonly known as:  BUSPAR  Take 10 mg by mouth 2 (two) times daily.     citalopram 10 MG tablet  Commonly known as:  CELEXA  Take 10 mg by mouth every morning.     cyclobenzaprine 10 MG tablet  Commonly known as:  FLEXERIL  Take 10 mg by mouth 2 (two) times daily.     diphenhydrAMINE 25 MG tablet  Commonly known as:  BENADRYL  Take 1 tablet (25 mg total) by mouth every 6 (six) hours as needed for itching.     esomeprazole 20 MG capsule  Commonly known as:  NEXIUM  Take 1 capsule (20 mg total) by mouth 2 (two) times daily before a meal.     fexofenadine 180 MG tablet  Commonly known as:  ALLEGRA  Take 180 mg by mouth daily.     fluocinolone 0.01 % external solution  Commonly known as:  SYNALAR  Place 2 drops into both ears 2 (two) times daily as needed (itching).     irbesartan 150 MG tablet  Commonly known as:  AVAPRO  Take 150 mg by mouth 2 (two) times daily.     predniSONE 50 MG tablet  Commonly known as:  DELTASONE  Take 1 tablet (50 mg total) by mouth daily with breakfast. For 5 days     Vitamin D 2000 UNITS tablet  Take 2,000 Units by mouth daily.       Allergies  Allergen Reactions  . Bactrim [Sulfamethoxazole-Tmp Ds] Shortness Of Breath  . Cefuroxime Shortness Of Breath  . Pneumococcal  Vaccines Hives    Hives on arm after shot  . Omnipaque [Iohexol]     Patient stated that she had a reaction a few years ago to the IV dye and had trouble breathing--so for her CT Angio Chest today (07/12/13), ER MD gave her solumedrol and Benadryl before she had test and did very well with that.  ak 07/12/13  . Penicillins Diarrhea  . Sulfa Antibiotics Other (See Comments)    unknown       Follow-up Information   Follow up with LISCHKE,AMIMEE, MD. Schedule an appointment as soon as possible for a visit in 1 week.   Specialty:  Family Medicine       The results of significant diagnostics from this hospitalization (including  imaging, microbiology, ancillary and laboratory) are listed below for reference.    Significant Diagnostic Studies: Dg Chest 2 View  07/12/2013   CLINICAL DATA:  PREOP L TOTAL HIP / HX HBP  EXAM: CHEST  2 VIEW  COMPARISON:  None.  FINDINGS: The heart size and mediastinal contours are within normal limits. Both lungs are clear. The visualized skeletal structures are unremarkable. Minimal discoid atelectasis versus scarring in the lung bases.  IMPRESSION: No active cardiopulmonary disease.   Electronically Signed   By: Margaree Mackintosh M.D.   On: 07/12/2013 13:16   Dg Hip Complete Left  07/12/2013   CLINICAL DATA:  Left hip osteoarthritis.  EXAM: LEFT HIP - COMPLETE 2+ VIEW  COMPARISON:  None.  FINDINGS: Severe narrowing of the superior portion of the left hip joint is noted consistent with osteoarthritis. No fracture or dislocation is noted.  IMPRESSION: Severe osteoarthritis of left hip is noted.   Electronically Signed   By: Sabino Dick M.D.   On: 07/12/2013 13:16   Ct Angio Chest Pe W/cm &/or Wo Cm  07/12/2013   CLINICAL DATA:  Elevated D-dimers, chest pain  EXAM: CT ANGIOGRAPHY CHEST WITH CONTRAST  TECHNIQUE: Multidetector CT imaging of the chest was performed using the standard protocol during bolus administration of intravenous contrast. Multiplanar CT image  reconstructions and MIPs were obtained to evaluate the vascular anatomy.  CONTRAST:  122mL OMNIPAQUE IOHEXOL 350 MG/ML SOLN  COMPARISON:  Chest x-ray July 12, 2013  FINDINGS: There is no pulmonary embolus. There is no mediastinal or hilar lymphadenopathy. There is no pulmonary edema or pleural effusion. There is a 2 mm nodule in the left upper lobe image 16. There is a 1.5 cm nodular density in the posterior basal segment of the right lower lobe image 61. The heart size is normal. There is no pericardial effusion. The visualized upper abdominal structures are unremarkable. There is a minimal hiatal hernia. Degenerative joint changes of the spine are noted.  Review of the MIP images confirms the above findings.  IMPRESSION: No pulmonary embolus. No acute abnormality identified within the chest.  Pulmonary nodules as described. The largest nodule measures 1.5 cm in the right lower lobe. Further evaluation with PET CT is recommended.   Electronically Signed   By: Abelardo Diesel M.D.   On: 07/12/2013 17:26    Microbiology: Recent Results (from the past 240 hour(s))  SURGICAL PCR SCREEN     Status: None   Collection Time    07/12/13  7:00 PM      Result Value Ref Range Status   MRSA, PCR NEGATIVE  NEGATIVE Final   Staphylococcus aureus NEGATIVE  NEGATIVE Final   Comment:            The Xpert SA Assay (FDA     approved for NASAL specimens     in patients over 39 years of age),     is one component of     a comprehensive surveillance     program.  Test performance has     been validated by [employer] for patients greater     than or equal to 34 year old.     It is not intended     to diagnose infection nor to     guide or monitor treatment.     Labs: Basic Metabolic Panel:  Recent Labs Lab 07/12/13 1005 07/12/13 1302 07/13/13 0230  NA 142 141 136*  K 4.4 4.3 4.3  CL 105 105 102  CO2 24 24 21   GLUCOSE 122* 105* 235*  BUN 11 12 16   CREATININE 0.92 0.91 0.88  CALCIUM 9.1 9.1 9.1    Liver Function Tests:  Recent Labs Lab 07/12/13 1005 07/13/13 0230  AST 22 17  ALT 16 15  ALKPHOS 84 77  BILITOT 0.3 0.2*  PROT 7.7 7.1  ALBUMIN 3.7 3.3*   No results found for this basename: LIPASE, AMYLASE,  in the last 168 hours No results found for this basename: AMMONIA,  in the last 168 hours CBC:  Recent Labs Lab 07/12/13 1005 07/12/13 1302 07/13/13 0230  WBC 11.2* 10.9* 17.5*  HGB 12.2 12.3 11.7*  HCT 36.7 37.5 35.0*  MCV 95.1 94.7 94.1  PLT 364 385 350   Cardiac Enzymes:  Recent Labs Lab 07/12/13 2101 07/13/13 0230 07/13/13 0934  TROPONINI <0.30 <0.30 <0.30   BNP: BNP (last 3 results) No results found for this basename: PROBNP,  in the last 8760 hours CBG: No results found for this basename: GLUCAP,  in the last 168 hours     Signed:  Louellen Molder  [facility] 07/14/2013, 5:28 PM

## 2013-07-14 NOTE — Progress Notes (Signed)
Sleep study put on chart

## 2013-07-27 ENCOUNTER — Other Ambulatory Visit: Payer: Self-pay | Admitting: Orthopedic Surgery

## 2013-07-27 NOTE — H&P (Signed)
Gavin Potters DOB: 07-02-1950 Married / Language: Cleophus Molt / Race: White Female Date of Admission:  07-28-2013 Chief Complaint:  Left Hip Pain History of Present Illness  The patient is a 63 year old female who comes in for a preoperative history and physical. The patient is scheduled for a left total hip arthroplasty (anterior approach) to be performed by Dr. Dione Plover. Aluisio, MD at [facility] on 07/28/2013. The patient is a 64 year old female who presents with a hip problem. The patient reports left hip problems including pain symptoms that have been present for year(s). The symptoms began without any known injury. The patient reports symptoms radiating to the: left groin, left knee and lower back and left flank area (she also has a history of sciatica).The patient feels as if their symptoms are does feel they are worsening. Symptoms are exacerbated by weight bearing and lying on the affected side. Current treatment includes nonsteroidal anti-inflammatory drugs (Aleve). Prior to being seen today the patient was previously evaluated by a colleague (Dr. Ennis Forts). Previous workup for this problem has included hip x-rays. Previous treatment for this problem has included physical therapy (She is a sarcoma survivor with right AKA; she went to therapy on her own because she wanted to get her trunk as strong as possible. She was previously scheduled for total hip, but cancelled the surgery). Unfortunately, her left hip pain has gotten a lot worse over time. 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 She has adapted extremely well. She said she was never able to get a prosthesis to fit so she is ambulating without a prosthesis, utilizing crutches since that time. She thus, has overloaded her left hip. She is starting to have decreased movement in the hip. Her pain is getting worse. Medications are not as effective as they once were. She saw Dr. Lavone Neri in [place]  who recommended a hip replacement. The pain is affecting her throughout the day and even sometimes at night. She would like to proceed with the hip replacement at this time. They have been treated conservatively in the past for the above stated problem and despite conservative measures, they continue to have progressive pain and severe functional limitations and dysfunction. They have failed non-operative management including home exercise, medications. It is felt that they would benefit from undergoing total joint replacement. Risks and benefits of the procedure have been discussed with the patient and they elect to proceed with surgery. There are no active contraindications to surgery such as ongoing infection or rapidly progressive neurological disease.  Allergies Penicillins Diarrhea. Bactrim *Anti-infective Agents - Misc.** Asthma Symptoms Sulfa Drugs Asthma Symptoms Ceftin *CEPHALOSPORINS* Asthma Symptoms Nickel Rash.  Problem List/Past Medical  Hip osteoarthritis (715.95  M16.9) Peripheral Neuropathy High blood pressure Fibromyalgia Hypercholesterolemia Osteoarthritis Irritable bowel syndrome Asthma Anxiety Disorder Cancer Depression Chronic Pain Sarcoma Right Knee Impaired Memory Cataract Bronchitis Pneumonia Sleep Apnea uses CPAP Hiatal Hernia Urinary Tract Infection Degenerative Disc Disease Scarlet Fever Measles Mumps Rubella Eczema Menopause  Family History  Heart disease in female family member before age 3 Heart disease in female family member before age 10 Hypertension mother, father and sister Severe allergy sister Osteoarthritis mother, sister and grandmother mothers side Cerebrovascular Accident sister Heart Disease father, sister and grandfather fathers side Depression mother and father  Social History Exercise Exercises never Drug/Alcohol Rehab (Previously) no Illicit drug use no Marital status  married Living situation live with spouse Alcohol use never consumed alcohol  Children 2 Drug/Alcohol Rehab (Currently) no Current work status disabled Tobacco / smoke exposure no Number of flights of stairs before winded less than 1 Tobacco use former smoker; smoke(d) 1 pack(s) per day  Medication History BusPIRone HCl (10MG  Tablet, Oral) Active. Irbesartan (150MG  Tablet, Oral) Active. BuPROPion HCl ER (SR) (150MG  Tablet ER 12HR, Oral) Active. CeleXA (10MG  Tablet, 1/2 Oral daily) Active. Wellbutrin XL (150MG  Tablet ER 24HR, Oral two times daily) Active. Ibuprofen (200MG  Tablet, Oral) Active. Aleve (220MG  Tablet, Oral) Active.  Past Surgical History Hysterectomy Date: 60. partial (non-cancerous) Above Knee Amputation Right Knee Date: 1973.  Review of Systems  General Present- Fatigue. Not Present- Chills, Fever, Memory Loss, Night Sweats, Weight Gain and Weight Loss. Skin Not Present- Eczema, Hives, Itching, Lesions and Rash. HEENT Not Present- Dentures, Double Vision, Headache, Hearing Loss, Tinnitus and Visual Loss. Respiratory Present- Shortness of breath with exertion. Not Present- Allergies, Chronic Cough, Coughing up blood and Shortness of breath at rest. Cardiovascular Not Present- Chest Pain, Difficulty Breathing Lying Down, Murmur, Palpitations, Racing/skipping heartbeats and Swelling. Gastrointestinal Present- Constipation. Not Present- Abdominal Pain, Bloody Stool, Diarrhea, Difficulty Swallowing, Heartburn, Jaundice, Loss of appetitie, Nausea and Vomiting. Female Genitourinary Not Present- Blood in Urine, Discharge, Flank Pain, Incontinence, Painful Urination, Urgency, Urinary frequency, Urinary Retention, Urinating at Night and Weak urinary stream. Musculoskeletal Present- Back Pain, Joint Pain and Muscle Weakness. Not Present- Joint Swelling, Morning Stiffness, Muscle Pain and Spasms. Neurological Not Present- Blackout spells, Difficulty with balance,  Dizziness, Paralysis, Tremor and Weakness. Psychiatric Not Present- Insomnia.  Vitals  Weight: 154 lb Height: 62.5in Weight was reported by patient. Height was reported by patient. Body Surface Area: 1.76 m Body Mass Index: 27.72 kg/m BP: 134/60 (Sitting, Right Arm, Standard)  Physical Exam  General Mental Status -Alert, cooperative and good historian. General Appearance-pleasant, Not in acute distress. Orientation-Oriented X3. Build & Nutrition-Well nourished and Well developed.  Head and Neck Head-normocephalic, atraumatic . Neck Global Assessment - supple, no bruit auscultated on the right, no bruit auscultated on the left.  Eye Vision-Wears corrective lenses. Pupil - Bilateral-Regular and Round. Motion - Bilateral-EOMI.  Chest and Lung Exam Auscultation Breath sounds - clear at anterior chest wall and clear at posterior chest wall. Adventitious sounds - No Adventitious sounds.  Cardiovascular Auscultation Rhythm - Regular rate and rhythm. Heart Sounds - S1 WNL and S2 WNL. Murmurs & Other Heart Sounds - Auscultation of the heart reveals - No Murmurs.  Abdomen Palpation/Percussion Tenderness - Abdomen is non-tender to palpation. Rigidity (guarding) - Abdomen is soft. Auscultation Auscultation of the abdomen reveals - Bowel sounds normal.  Female Genitourinary Note: Not done, not pertinent to present illness   Musculoskeletal Note: Musculoskeletal: Her left hip can be flexed to 110. Minimal internal rotation, about 30 external rotation, 30 abduction. She does have pain on hip motion.  Extremities: Left knee exam is normal. Pulse, sensation, motor intact left lower extremity. Right thigh shows a very high right above knee amputation. She has got excellent range of motion of that right hip.  X-RAYS: AP pelvis and lateral of the left hip show end stage arthritic change, bone on bone, with some subchondral cystic formation and slight  lateralization of the femoral head.  Assessment & Plan Osteoarthritis of left hip (715.95  M16.12) Impression: Left Hip  Note:Plan is for a Left Total Hip Replacement - Anterior Approach by Dr. Wynelle Link.  Plan is to go home.  PCP - Dr. Carolyn Stare - Patient has been seen preoperatively and felt to  be stable for surgery.  The patient does not have any contraindications and will receive TXA (tranexamic acid) prior to surgery.  Signed electronically by Ok Edwards, III PA-C

## 2013-07-28 ENCOUNTER — Inpatient Hospital Stay (HOSPITAL_COMMUNITY): Payer: BC Managed Care – PPO

## 2013-07-28 ENCOUNTER — Encounter (HOSPITAL_COMMUNITY)
Admission: RE | Disposition: A | Discharge status: Rehabilitation Facility | Payer: Self-pay | Source: Ambulatory Visit | Attending: Orthopedic Surgery

## 2013-07-28 ENCOUNTER — Inpatient Hospital Stay (HOSPITAL_COMMUNITY)
Admission: RE | Admit: 2013-07-28 | Discharge: 2013-07-30 | DRG: 470 | Disposition: A | Discharge status: Rehabilitation Facility | Payer: BC Managed Care – PPO | Source: Ambulatory Visit | Attending: Orthopedic Surgery | Admitting: Orthopedic Surgery

## 2013-07-28 ENCOUNTER — Encounter (HOSPITAL_COMMUNITY): Payer: Self-pay | Admitting: *Deleted

## 2013-07-28 ENCOUNTER — Encounter (HOSPITAL_COMMUNITY): Payer: BC Managed Care – PPO | Admitting: Anesthesiology

## 2013-07-28 ENCOUNTER — Inpatient Hospital Stay (HOSPITAL_COMMUNITY): Payer: BC Managed Care – PPO | Admitting: Anesthesiology

## 2013-07-28 DIAGNOSIS — G547 Phantom limb syndrome without pain: Secondary | ICD-10-CM

## 2013-07-28 DIAGNOSIS — F329 Major depressive disorder, single episode, unspecified: Secondary | ICD-10-CM

## 2013-07-28 DIAGNOSIS — Z8589 Personal history of malignant neoplasm of other organs and systems: Secondary | ICD-10-CM

## 2013-07-28 DIAGNOSIS — F411 Generalized anxiety disorder: Secondary | ICD-10-CM

## 2013-07-28 DIAGNOSIS — M169 Osteoarthritis of hip, unspecified: Secondary | ICD-10-CM

## 2013-07-28 DIAGNOSIS — L259 Unspecified contact dermatitis, unspecified cause: Secondary | ICD-10-CM

## 2013-07-28 DIAGNOSIS — Z823 Family history of stroke: Secondary | ICD-10-CM

## 2013-07-28 DIAGNOSIS — F3289 Other specified depressive episodes: Secondary | ICD-10-CM | POA: Diagnosis present

## 2013-07-28 DIAGNOSIS — M1610 Unilateral primary osteoarthritis, unspecified hip: Principal | ICD-10-CM | POA: Diagnosis present

## 2013-07-28 DIAGNOSIS — S78119A Complete traumatic amputation at level between unspecified hip and knee, initial encounter: Secondary | ICD-10-CM

## 2013-07-28 DIAGNOSIS — Z9289 Personal history of other medical treatment: Secondary | ICD-10-CM

## 2013-07-28 DIAGNOSIS — Z87891 Personal history of nicotine dependence: Secondary | ICD-10-CM

## 2013-07-28 DIAGNOSIS — D72829 Elevated white blood cell count, unspecified: Secondary | ICD-10-CM | POA: Diagnosis present

## 2013-07-28 DIAGNOSIS — K219 Gastro-esophageal reflux disease without esophagitis: Secondary | ICD-10-CM

## 2013-07-28 DIAGNOSIS — M1612 Unilateral primary osteoarthritis, left hip: Secondary | ICD-10-CM

## 2013-07-28 DIAGNOSIS — J45909 Unspecified asthma, uncomplicated: Secondary | ICD-10-CM

## 2013-07-28 DIAGNOSIS — D62 Acute posthemorrhagic anemia: Secondary | ICD-10-CM

## 2013-07-28 DIAGNOSIS — M797 Fibromyalgia: Secondary | ICD-10-CM

## 2013-07-28 DIAGNOSIS — Z96642 Presence of left artificial hip joint: Secondary | ICD-10-CM

## 2013-07-28 DIAGNOSIS — G4733 Obstructive sleep apnea (adult) (pediatric): Secondary | ICD-10-CM

## 2013-07-28 DIAGNOSIS — Z8249 Family history of ischemic heart disease and other diseases of the circulatory system: Secondary | ICD-10-CM

## 2013-07-28 DIAGNOSIS — F32A Depression, unspecified: Secondary | ICD-10-CM

## 2013-07-28 DIAGNOSIS — K449 Diaphragmatic hernia without obstruction or gangrene: Secondary | ICD-10-CM

## 2013-07-28 HISTORY — PX: TOTAL HIP ARTHROPLASTY: SHX124

## 2013-07-28 HISTORY — DX: Sleep apnea, unspecified: G47.30

## 2013-07-28 LAB — TYPE AND SCREEN
ABO/RH(D): O POS
Antibody Screen: NEGATIVE

## 2013-07-28 LAB — GLUCOSE, CAPILLARY: Glucose-Capillary: 149 mg/dL — ABNORMAL HIGH (ref 70–99)

## 2013-07-28 LAB — ABO/RH: ABO/RH(D): O POS

## 2013-07-28 SURGERY — ARTHROPLASTY, HIP, TOTAL, ANTERIOR APPROACH
Anesthesia: General | Site: Hip | Laterality: Left

## 2013-07-28 MED ORDER — FENTANYL CITRATE 0.05 MG/ML IJ SOLN
INTRAMUSCULAR | Status: DC | PRN
  Administered 2013-07-28: 50 ug via INTRAVENOUS
  Administered 2013-07-28: 100 ug via INTRAVENOUS
  Administered 2013-07-28 (×2): 50 ug via INTRAVENOUS

## 2013-07-28 MED ORDER — SODIUM CHLORIDE 0.9 % IV SOLN: INTRAVENOUS | Status: DC

## 2013-07-28 MED ORDER — VANCOMYCIN HCL IN DEXTROSE 1-5 GM/200ML-% IV SOLN
1000.0000 mg | Freq: Two times a day (BID) | INTRAVENOUS | Status: AC
  Administered 2013-07-28: 1000 mg via INTRAVENOUS
  Filled 2013-07-28: qty 200

## 2013-07-28 MED ORDER — DOXEPIN HCL 10 MG PO CAPS
10.0000 mg | ORAL_CAPSULE | Freq: Every day | ORAL | Status: DC
  Administered 2013-07-28 – 2013-07-29 (×2): 10 mg via ORAL
  Filled 2013-07-28 (×3): qty 1

## 2013-07-28 MED ORDER — ACETAMINOPHEN 325 MG PO TABS
650.0000 mg | ORAL_TABLET | Freq: Four times a day (QID) | ORAL | Status: DC | PRN
  Administered 2013-07-29 – 2013-07-30 (×4): 650 mg via ORAL
  Filled 2013-07-28 (×4): qty 2

## 2013-07-28 MED ORDER — DEXAMETHASONE SODIUM PHOSPHATE 10 MG/ML IJ SOLN
10.0000 mg | Freq: Every day | INTRAMUSCULAR | Status: AC
  Filled 2013-07-28: qty 1

## 2013-07-28 MED ORDER — RIVAROXABAN 10 MG PO TABS
10.0000 mg | ORAL_TABLET | Freq: Every day | ORAL | Status: DC
  Administered 2013-07-29 – 2013-07-30 (×2): 10 mg via ORAL
  Filled 2013-07-28 (×4): qty 1

## 2013-07-28 MED ORDER — ONDANSETRON HCL 4 MG/2ML IJ SOLN
INTRAMUSCULAR | Status: DC | PRN
  Administered 2013-07-28: 4 mg via INTRAVENOUS

## 2013-07-28 MED ORDER — DIPHENHYDRAMINE HCL 50 MG/ML IJ SOLN
25.0000 mg | Freq: Once | INTRAMUSCULAR | Status: AC
  Administered 2013-07-28: 25 mg via INTRAVENOUS

## 2013-07-28 MED ORDER — CEFAZOLIN SODIUM-DEXTROSE 2-3 GM-% IV SOLR: 2.0000 g | INTRAVENOUS | Status: DC

## 2013-07-28 MED ORDER — DIPHENHYDRAMINE HCL 50 MG/ML IJ SOLN
25.0000 mg | Freq: Once | INTRAMUSCULAR | Status: AC
Start: 2013-07-28 — End: 2013-07-28
  Administered 2013-07-28: 25 mg via INTRAVENOUS

## 2013-07-28 MED ORDER — OXYCODONE HCL 5 MG PO TABS
5.0000 mg | ORAL_TABLET | ORAL | Status: DC | PRN
  Administered 2013-07-28: 5 mg via ORAL
  Administered 2013-07-28 – 2013-07-29 (×2): 10 mg via ORAL
  Filled 2013-07-28 (×2): qty 2
  Filled 2013-07-28: qty 1

## 2013-07-28 MED ORDER — MENTHOL 3 MG MT LOZG: 1.0000 | LOZENGE | OROMUCOSAL | Status: DC | PRN

## 2013-07-28 MED ORDER — SODIUM CHLORIDE 0.9 % IJ SOLN
INTRAMUSCULAR | Status: DC | PRN
  Administered 2013-07-28: 30 mL

## 2013-07-28 MED ORDER — POLYETHYLENE GLYCOL 3350 17 G PO PACK: 17.0000 g | PACK | Freq: Every day | ORAL | Status: DC | PRN

## 2013-07-28 MED ORDER — PANTOPRAZOLE SODIUM 40 MG PO TBEC
40.0000 mg | DELAYED_RELEASE_TABLET | Freq: Two times a day (BID) | ORAL | Status: DC
  Administered 2013-07-28: 40 mg via ORAL
  Filled 2013-07-28 (×2): qty 1

## 2013-07-28 MED ORDER — ALBUTEROL SULFATE (2.5 MG/3ML) 0.083% IN NEBU: 2.5000 mg | INHALATION_SOLUTION | Freq: Four times a day (QID) | RESPIRATORY_TRACT | Status: DC | PRN

## 2013-07-28 MED ORDER — PROPOFOL 10 MG/ML IV BOLUS
INTRAVENOUS | Status: AC
  Filled 2013-07-28: qty 20

## 2013-07-28 MED ORDER — CITALOPRAM HYDROBROMIDE 10 MG PO TABS
10.0000 mg | ORAL_TABLET | Freq: Every morning | ORAL | Status: DC
  Administered 2013-07-29 – 2013-07-30 (×2): 10 mg via ORAL
  Filled 2013-07-28 (×2): qty 1

## 2013-07-28 MED ORDER — METOCLOPRAMIDE HCL 10 MG PO TABS: 5.0000 mg | ORAL_TABLET | Freq: Three times a day (TID) | ORAL | Status: DC | PRN

## 2013-07-28 MED ORDER — HYDROMORPHONE HCL PF 1 MG/ML IJ SOLN: 0.2500 mg | INTRAMUSCULAR | Status: DC | PRN

## 2013-07-28 MED ORDER — METHOCARBAMOL 1000 MG/10ML IJ SOLN
500.0000 mg | Freq: Four times a day (QID) | INTRAVENOUS | Status: DC | PRN
  Administered 2013-07-28: 500 mg via INTRAVENOUS
  Filled 2013-07-28: qty 5

## 2013-07-28 MED ORDER — ONDANSETRON HCL 4 MG/2ML IJ SOLN
INTRAMUSCULAR | Status: AC
  Filled 2013-07-28: qty 2

## 2013-07-28 MED ORDER — MIDAZOLAM HCL 2 MG/2ML IJ SOLN
INTRAMUSCULAR | Status: AC
  Filled 2013-07-28: qty 2

## 2013-07-28 MED ORDER — DEXAMETHASONE SODIUM PHOSPHATE 10 MG/ML IJ SOLN
INTRAMUSCULAR | Status: AC
  Filled 2013-07-28: qty 1

## 2013-07-28 MED ORDER — HYDROCORTISONE NA SUCCINATE PF 100 MG IJ SOLR
100.0000 mg | Freq: Once | INTRAMUSCULAR | Status: AC
  Administered 2013-07-28: 100 mg via INTRAVENOUS

## 2013-07-28 MED ORDER — GLYCOPYRROLATE 0.2 MG/ML IJ SOLN
INTRAMUSCULAR | Status: DC | PRN
  Administered 2013-07-28: .6 mg via INTRAVENOUS

## 2013-07-28 MED ORDER — LACTATED RINGERS IV SOLN
INTRAVENOUS | Status: DC
  Administered 2013-07-28: 12:00:00 via INTRAVENOUS
  Administered 2013-07-28: 1000 mL via INTRAVENOUS

## 2013-07-28 MED ORDER — VANCOMYCIN HCL IN DEXTROSE 1-5 GM/200ML-% IV SOLN
INTRAVENOUS | Status: AC
  Filled 2013-07-28: qty 200

## 2013-07-28 MED ORDER — NEOSTIGMINE METHYLSULFATE 10 MG/10ML IV SOLN
INTRAVENOUS | Status: DC | PRN
  Administered 2013-07-28: 4 mg via INTRAVENOUS

## 2013-07-28 MED ORDER — METHOCARBAMOL 500 MG PO TABS
500.0000 mg | ORAL_TABLET | Freq: Four times a day (QID) | ORAL | Status: DC | PRN
  Administered 2013-07-29 – 2013-07-30 (×6): 500 mg via ORAL
  Filled 2013-07-28 (×5): qty 1

## 2013-07-28 MED ORDER — VANCOMYCIN HCL IN DEXTROSE 1-5 GM/200ML-% IV SOLN
1000.0000 mg | Freq: Once | INTRAVENOUS | Status: AC
  Administered 2013-07-28: 1000 mg via INTRAVENOUS

## 2013-07-28 MED ORDER — LACTATED RINGERS IV SOLN: INTRAVENOUS | Status: DC

## 2013-07-28 MED ORDER — DEXAMETHASONE SODIUM PHOSPHATE 10 MG/ML IJ SOLN
10.0000 mg | Freq: Once | INTRAMUSCULAR | Status: AC
  Administered 2013-07-28: 10 mg via INTRAVENOUS

## 2013-07-28 MED ORDER — MORPHINE SULFATE 2 MG/ML IJ SOLN: 1.0000 mg | INTRAMUSCULAR | Status: DC | PRN

## 2013-07-28 MED ORDER — PHENOL 1.4 % MT LIQD: 1.0000 | OROMUCOSAL | Status: DC | PRN

## 2013-07-28 MED ORDER — PROPOFOL 10 MG/ML IV BOLUS
INTRAVENOUS | Status: DC | PRN
  Administered 2013-07-28: 150 mg via INTRAVENOUS

## 2013-07-28 MED ORDER — DOCUSATE SODIUM 100 MG PO CAPS
100.0000 mg | ORAL_CAPSULE | Freq: Two times a day (BID) | ORAL | Status: DC
  Administered 2013-07-28 – 2013-07-30 (×4): 100 mg via ORAL

## 2013-07-28 MED ORDER — METOCLOPRAMIDE HCL 5 MG/ML IJ SOLN: 5.0000 mg | Freq: Three times a day (TID) | INTRAMUSCULAR | Status: DC | PRN

## 2013-07-28 MED ORDER — ACETAMINOPHEN 10 MG/ML IV SOLN
1000.0000 mg | Freq: Once | INTRAVENOUS | Status: AC
  Administered 2013-07-28: 1000 mg via INTRAVENOUS
  Filled 2013-07-28: qty 100

## 2013-07-28 MED ORDER — HYDROMORPHONE HCL PF 1 MG/ML IJ SOLN
INTRAMUSCULAR | Status: DC | PRN
  Administered 2013-07-28: 0.5 mg via INTRAVENOUS

## 2013-07-28 MED ORDER — SODIUM CHLORIDE 0.9 % IV SOLN
INTRAVENOUS | Status: DC
  Administered 2013-07-28 – 2013-07-29 (×2): via INTRAVENOUS

## 2013-07-28 MED ORDER — DIPHENHYDRAMINE HCL 50 MG/ML IJ SOLN
INTRAMUSCULAR | Status: AC
  Filled 2013-07-28: qty 1

## 2013-07-28 MED ORDER — ONDANSETRON HCL 4 MG PO TABS: 4.0000 mg | ORAL_TABLET | Freq: Four times a day (QID) | ORAL | Status: DC | PRN

## 2013-07-28 MED ORDER — LIDOCAINE HCL (CARDIAC) 20 MG/ML IV SOLN
INTRAVENOUS | Status: DC | PRN
  Administered 2013-07-28: 75 mg via INTRAVENOUS

## 2013-07-28 MED ORDER — ROCURONIUM BROMIDE 100 MG/10ML IV SOLN
INTRAVENOUS | Status: AC
  Filled 2013-07-28: qty 1

## 2013-07-28 MED ORDER — BUPIVACAINE HCL (PF) 0.25 % IJ SOLN
INTRAMUSCULAR | Status: AC
  Filled 2013-07-28: qty 30

## 2013-07-28 MED ORDER — MIDAZOLAM HCL 5 MG/5ML IJ SOLN
INTRAMUSCULAR | Status: DC | PRN
  Administered 2013-07-28: 2 mg via INTRAVENOUS

## 2013-07-28 MED ORDER — ONDANSETRON HCL 4 MG/2ML IJ SOLN: 4.0000 mg | Freq: Four times a day (QID) | INTRAMUSCULAR | Status: DC | PRN

## 2013-07-28 MED ORDER — BISACODYL 10 MG RE SUPP: 10.0000 mg | Freq: Every day | RECTAL | Status: DC | PRN

## 2013-07-28 MED ORDER — ESOMEPRAZOLE MAGNESIUM 40 MG PO CPDR
40.0000 mg | DELAYED_RELEASE_CAPSULE | Freq: Two times a day (BID) | ORAL | Status: DC
  Administered 2013-07-29 – 2013-07-30 (×3): 40 mg via ORAL
  Filled 2013-07-28 (×5): qty 1

## 2013-07-28 MED ORDER — HYDROCORTISONE NA SUCCINATE PF 100 MG IJ SOLR
INTRAMUSCULAR | Status: AC
  Filled 2013-07-28: qty 2

## 2013-07-28 MED ORDER — DIPHENHYDRAMINE HCL 12.5 MG/5ML PO ELIX: 12.5000 mg | ORAL_SOLUTION | ORAL | Status: DC | PRN

## 2013-07-28 MED ORDER — ROCURONIUM BROMIDE 100 MG/10ML IV SOLN
INTRAVENOUS | Status: DC | PRN
  Administered 2013-07-28: 50 mg via INTRAVENOUS

## 2013-07-28 MED ORDER — FLEET ENEMA 7-19 GM/118ML RE ENEM: 1.0000 | ENEMA | Freq: Once | RECTAL | Status: AC | PRN

## 2013-07-28 MED ORDER — SODIUM CHLORIDE 0.9 % IJ SOLN
INTRAMUSCULAR | Status: AC
  Filled 2013-07-28: qty 50

## 2013-07-28 MED ORDER — DEXAMETHASONE 6 MG PO TABS
10.0000 mg | ORAL_TABLET | Freq: Every day | ORAL | Status: AC
  Administered 2013-07-29: 10 mg via ORAL
  Filled 2013-07-28: qty 1

## 2013-07-28 MED ORDER — KETOROLAC TROMETHAMINE 15 MG/ML IJ SOLN: 7.5000 mg | Freq: Four times a day (QID) | INTRAMUSCULAR | Status: AC | PRN

## 2013-07-28 MED ORDER — NON FORMULARY: 40.0000 mg | Freq: Two times a day (BID) | Status: DC

## 2013-07-28 MED ORDER — IRBESARTAN 150 MG PO TABS
150.0000 mg | ORAL_TABLET | Freq: Two times a day (BID) | ORAL | Status: DC
  Administered 2013-07-29: 150 mg via ORAL
  Filled 2013-07-28 (×5): qty 1

## 2013-07-28 MED ORDER — LORATADINE 10 MG PO TABS
10.0000 mg | ORAL_TABLET | Freq: Every day | ORAL | Status: DC
  Administered 2013-07-29 – 2013-07-30 (×2): 10 mg via ORAL
  Filled 2013-07-28 (×2): qty 1

## 2013-07-28 MED ORDER — BUPIVACAINE LIPOSOME 1.3 % IJ SUSP
20.0000 mL | Freq: Once | INTRAMUSCULAR | Status: DC
  Filled 2013-07-28: qty 20

## 2013-07-28 MED ORDER — HYDROMORPHONE HCL PF 2 MG/ML IJ SOLN
INTRAMUSCULAR | Status: AC
  Filled 2013-07-28: qty 1

## 2013-07-28 MED ORDER — LIDOCAINE HCL (CARDIAC) 20 MG/ML IV SOLN
INTRAVENOUS | Status: AC
  Filled 2013-07-28: qty 5

## 2013-07-28 MED ORDER — ACETAMINOPHEN 10 MG/ML IV SOLN: INTRAVENOUS | Status: DC | PRN

## 2013-07-28 MED ORDER — BUPIVACAINE HCL (PF) 0.25 % IJ SOLN
INTRAMUSCULAR | Status: DC | PRN
  Administered 2013-07-28: 20 mL

## 2013-07-28 MED ORDER — FENTANYL CITRATE 0.05 MG/ML IJ SOLN
INTRAMUSCULAR | Status: AC
  Filled 2013-07-28: qty 5

## 2013-07-28 MED ORDER — BUSPIRONE HCL 10 MG PO TABS
10.0000 mg | ORAL_TABLET | Freq: Two times a day (BID) | ORAL | Status: DC
  Administered 2013-07-28 – 2013-07-30 (×4): 10 mg via ORAL
  Filled 2013-07-28 (×5): qty 1

## 2013-07-28 MED ORDER — GLYCOPYRROLATE 0.2 MG/ML IJ SOLN
INTRAMUSCULAR | Status: AC
  Filled 2013-07-28: qty 4

## 2013-07-28 MED ORDER — PROMETHAZINE HCL 25 MG/ML IJ SOLN: 6.2500 mg | INTRAMUSCULAR | Status: DC | PRN

## 2013-07-28 MED ORDER — ACETAMINOPHEN 500 MG PO TABS
1000.0000 mg | ORAL_TABLET | Freq: Four times a day (QID) | ORAL | Status: AC
  Administered 2013-07-29 (×3): 1000 mg via ORAL
  Filled 2013-07-28 (×4): qty 2

## 2013-07-28 MED ORDER — BUPROPION HCL ER (SR) 150 MG PO TB12
150.0000 mg | ORAL_TABLET | Freq: Two times a day (BID) | ORAL | Status: DC
  Administered 2013-07-28 – 2013-07-30 (×4): 150 mg via ORAL
  Filled 2013-07-28 (×6): qty 1

## 2013-07-28 MED ORDER — BUPIVACAINE LIPOSOME 1.3 % IJ SUSP
INTRAMUSCULAR | Status: DC | PRN
Start: 2013-07-28 — End: 2013-07-28
  Administered 2013-07-28: 20 mL

## 2013-07-28 MED ORDER — ALBUTEROL SULFATE HFA 108 (90 BASE) MCG/ACT IN AERS: 2.0000 | INHALATION_SPRAY | Freq: Four times a day (QID) | RESPIRATORY_TRACT | Status: DC | PRN

## 2013-07-28 MED ORDER — ACETAMINOPHEN 650 MG RE SUPP: 650.0000 mg | Freq: Four times a day (QID) | RECTAL | Status: DC | PRN

## 2013-07-28 MED ORDER — CHLORHEXIDINE GLUCONATE 4 % EX LIQD: 60.0000 mL | Freq: Once | CUTANEOUS | Status: DC

## 2013-07-28 SURGICAL SUPPLY — 42 items
BAG ZIPLOCK 12X15 (MISCELLANEOUS) IMPLANT
BLADE EXTENDED COATED 6.5IN (ELECTRODE) ×3 IMPLANT
BLADE SAW SGTL 18X1.27X75 (BLADE) ×2 IMPLANT
BLADE SAW SGTL 18X1.27X75MM (BLADE) ×1
CAPT HIP PF COP ×3 IMPLANT
CLOSURE WOUND 1/2 X4 (GAUZE/BANDAGES/DRESSINGS) ×1
COVER PERINEAL POST (MISCELLANEOUS) ×3 IMPLANT
DECANTER SPIKE VIAL GLASS SM (MISCELLANEOUS) ×3 IMPLANT
DRAPE C-ARM 42X120 X-RAY (DRAPES) ×3 IMPLANT
DRAPE STERI IOBAN 125X83 (DRAPES) ×3 IMPLANT
DRAPE U-SHAPE 47X51 STRL (DRAPES) ×9 IMPLANT
DRSG ADAPTIC 3X8 NADH LF (GAUZE/BANDAGES/DRESSINGS) ×3 IMPLANT
DRSG MEPILEX BORDER 4X4 (GAUZE/BANDAGES/DRESSINGS) ×3 IMPLANT
DRSG MEPILEX BORDER 4X8 (GAUZE/BANDAGES/DRESSINGS) ×3 IMPLANT
DURAPREP 26ML APPLICATOR (WOUND CARE) ×3 IMPLANT
ELECT REM PT RETURN 9FT ADLT (ELECTROSURGICAL) ×3
ELECTRODE REM PT RTRN 9FT ADLT (ELECTROSURGICAL) ×1 IMPLANT
EVACUATOR 1/8 PVC DRAIN (DRAIN) ×3 IMPLANT
FACESHIELD WRAPAROUND (MASK) ×12 IMPLANT
GAUZE SPONGE 4X4 12PLY STRL (GAUZE/BANDAGES/DRESSINGS) IMPLANT
GLOVE BIO SURGEON STRL SZ7.5 (GLOVE) ×3 IMPLANT
GLOVE BIO SURGEON STRL SZ8 (GLOVE) ×6 IMPLANT
GLOVE BIOGEL PI IND STRL 8 (GLOVE) ×2 IMPLANT
GLOVE BIOGEL PI INDICATOR 8 (GLOVE) ×4
GOWN STRL REUS W/TWL LRG LVL3 (GOWN DISPOSABLE) ×3 IMPLANT
GOWN STRL REUS W/TWL XL LVL3 (GOWN DISPOSABLE) ×3 IMPLANT
KIT BASIN OR (CUSTOM PROCEDURE TRAY) ×3 IMPLANT
NDL SAFETY ECLIPSE 18X1.5 (NEEDLE) ×2 IMPLANT
NEEDLE HYPO 18GX1.5 SHARP (NEEDLE) ×4
PACK TOTAL JOINT (CUSTOM PROCEDURE TRAY) ×3 IMPLANT
PADDING CAST COTTON 6X4 STRL (CAST SUPPLIES) ×3 IMPLANT
STRIP CLOSURE SKIN 1/2X4 (GAUZE/BANDAGES/DRESSINGS) ×2 IMPLANT
SUCTION FRAZIER 12FR DISP (SUCTIONS) IMPLANT
SUT ETHIBOND NAB CT1 #1 30IN (SUTURE) ×3 IMPLANT
SUT MNCRL AB 4-0 PS2 18 (SUTURE) ×3 IMPLANT
SUT VIC AB 2-0 CT1 27 (SUTURE) ×4
SUT VIC AB 2-0 CT1 TAPERPNT 27 (SUTURE) ×2 IMPLANT
SUT VLOC 180 0 24IN GS25 (SUTURE) ×3 IMPLANT
SYRINGE 20CC LL (MISCELLANEOUS) ×3 IMPLANT
SYRINGE 60CC LL (MISCELLANEOUS) ×3 IMPLANT
TOWEL OR 17X26 10 PK STRL BLUE (TOWEL DISPOSABLE) ×3 IMPLANT
TRAY FOLEY CATH 14FRSI W/METER (CATHETERS) ×3 IMPLANT

## 2013-07-28 NOTE — Progress Notes (Signed)
Surgeon made aware of post-op rash of undetermined origion/etiology. Patient treated with Benadryl 25mg  IV in addition to Hydrocortisone 100mg  IV.

## 2013-07-28 NOTE — H&P (View-Only) (Signed)
[name] DOB: [birth year] Married / Language: Cleophus Molt / Race: White Female Date of Admission:  07-28-2013 Chief Complaint:  Left Hip Pain History of Present Illness  The patient is a 63 year old female who comes in for a preoperative history and physical. The patient is scheduled for a left total hip arthroplasty (anterior approach) to be performed by Dr. Dione Plover. Aluisio, MD at [facility] on 07/28/2013. The patient is a 63 year old female who presents with a hip problem. The patient reports left hip problems including pain symptoms that have been present for year(s). The symptoms began without any known injury. The patient reports symptoms radiating to the: left groin, left knee and lower back and left flank area (she also has a history of sciatica).The patient feels as if their symptoms are does feel they are worsening. Symptoms are exacerbated by weight bearing and lying on the affected side. Current treatment includes nonsteroidal anti-inflammatory drugs (Aleve). Prior to being seen today the patient was previously evaluated by a colleague (Dr. Ennis Forts). Previous workup for this problem has included hip x-rays. Previous treatment for this problem has included physical therapy (She is a sarcoma survivor with right AKA; she went to therapy on her own because she wanted to get her trunk as strong as possible. She was previously scheduled for total hip, but cancelled the surgery). Unfortunately, her left hip pain has gotten a lot worse over time. 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 She has adapted extremely well. She said she was never able to get a prosthesis to fit so she is ambulating without a prosthesis, utilizing crutches since that time. She thus, has overloaded her left hip. She is starting to have decreased movement in the hip. Her pain is getting worse. Medications are not as effective as they once were. She saw Dr. Lavone Neri in [place]  who recommended a hip replacement. The pain is affecting her throughout the day and even sometimes at night. She would like to proceed with the hip replacement at this time. They have been treated conservatively in the past for the above stated problem and despite conservative measures, they continue to have progressive pain and severe functional limitations and dysfunction. They have failed non-operative management including home exercise, medications. It is felt that they would benefit from undergoing total joint replacement. Risks and benefits of the procedure have been discussed with the patient and they elect to proceed with surgery. There are no active contraindications to surgery such as ongoing infection or rapidly progressive neurological disease.  Allergies Penicillins Diarrhea. Bactrim *Anti-infective Agents - Misc.** Asthma Symptoms Sulfa Drugs Asthma Symptoms Ceftin *CEPHALOSPORINS* Asthma Symptoms Nickel Rash.  Problem List/Past Medical  Hip osteoarthritis (715.95  M16.9) Peripheral Neuropathy High blood pressure Fibromyalgia Hypercholesterolemia Osteoarthritis Irritable bowel syndrome Asthma Anxiety Disorder Cancer Depression Chronic Pain Sarcoma Right Knee Impaired Memory Cataract Bronchitis Pneumonia Sleep Apnea uses CPAP Hiatal Hernia Urinary Tract Infection Degenerative Disc Disease Scarlet Fever Measles Mumps Rubella Eczema Menopause  Family History  Heart disease in female family member before age 67 Heart disease in female family member before age 69 Hypertension mother, father and sister Severe allergy sister Osteoarthritis mother, sister and grandmother mothers side Cerebrovascular Accident sister Heart Disease father, sister and grandfather fathers side Depression mother and father  Social History Exercise Exercises never Drug/Alcohol Rehab (Previously) no Illicit drug use no Marital status  married Living situation live with spouse Alcohol use never consumed alcohol  Children 2 Drug/Alcohol Rehab (Currently) no Current work status disabled Tobacco / smoke exposure no Number of flights of stairs before winded less than 1 Tobacco use former smoker; smoke(d) 1 pack(s) per day  Medication History BusPIRone HCl (10MG  Tablet, Oral) Active. Irbesartan (150MG  Tablet, Oral) Active. BuPROPion HCl ER (SR) (150MG  Tablet ER 12HR, Oral) Active. CeleXA (10MG  Tablet, 1/2 Oral daily) Active. Wellbutrin XL (150MG  Tablet ER 24HR, Oral two times daily) Active. Ibuprofen (200MG  Tablet, Oral) Active. Aleve (220MG  Tablet, Oral) Active.  Past Surgical History Hysterectomy Date: 31. partial (non-cancerous) Above Knee Amputation Right Knee Date: 1973.  Review of Systems  General Present- Fatigue. Not Present- Chills, Fever, Memory Loss, Night Sweats, Weight Gain and Weight Loss. Skin Not Present- Eczema, Hives, Itching, Lesions and Rash. HEENT Not Present- Dentures, Double Vision, Headache, Hearing Loss, Tinnitus and Visual Loss. Respiratory Present- Shortness of breath with exertion. Not Present- Allergies, Chronic Cough, Coughing up blood and Shortness of breath at rest. Cardiovascular Not Present- Chest Pain, Difficulty Breathing Lying Down, Murmur, Palpitations, Racing/skipping heartbeats and Swelling. Gastrointestinal Present- Constipation. Not Present- Abdominal Pain, Bloody Stool, Diarrhea, Difficulty Swallowing, Heartburn, Jaundice, Loss of appetitie, Nausea and Vomiting. Female Genitourinary Not Present- Blood in Urine, Discharge, Flank Pain, Incontinence, Painful Urination, Urgency, Urinary frequency, Urinary Retention, Urinating at Night and Weak urinary stream. Musculoskeletal Present- Back Pain, Joint Pain and Muscle Weakness. Not Present- Joint Swelling, Morning Stiffness, Muscle Pain and Spasms. Neurological Not Present- Blackout spells, Difficulty with balance,  Dizziness, Paralysis, Tremor and Weakness. Psychiatric Not Present- Insomnia.  Vitals  Weight: 154 lb Height: 62.5in Weight was reported by patient. Height was reported by patient. Body Surface Area: 1.76 m Body Mass Index: 27.72 kg/m BP: 134/60 (Sitting, Right Arm, Standard)  Physical Exam  General Mental Status -Alert, cooperative and good historian. General Appearance-pleasant, Not in acute distress. Orientation-Oriented X3. Build & Nutrition-Well nourished and Well developed.  Head and Neck Head-normocephalic, atraumatic . Neck Global Assessment - supple, no bruit auscultated on the right, no bruit auscultated on the left.  Eye Vision-Wears corrective lenses. Pupil - Bilateral-Regular and Round. Motion - Bilateral-EOMI.  Chest and Lung Exam Auscultation Breath sounds - clear at anterior chest wall and clear at posterior chest wall. Adventitious sounds - No Adventitious sounds.  Cardiovascular Auscultation Rhythm - Regular rate and rhythm. Heart Sounds - S1 WNL and S2 WNL. Murmurs & Other Heart Sounds - Auscultation of the heart reveals - No Murmurs.  Abdomen Palpation/Percussion Tenderness - Abdomen is non-tender to palpation. Rigidity (guarding) - Abdomen is soft. Auscultation Auscultation of the abdomen reveals - Bowel sounds normal.  Female Genitourinary Note: Not done, not pertinent to present illness   Musculoskeletal Note: Musculoskeletal: Her left hip can be flexed to 110. Minimal internal rotation, about 30 external rotation, 30 abduction. She does have pain on hip motion.  Extremities: Left knee exam is normal. Pulse, sensation, motor intact left lower extremity. Right thigh shows a very high right above knee amputation. She has got excellent range of motion of that right hip.  X-RAYS: AP pelvis and lateral of the left hip show end stage arthritic change, bone on bone, with some subchondral cystic formation and slight  lateralization of the femoral head.  Assessment & Plan Osteoarthritis of left hip (715.95  M16.12) Impression: Left Hip  Note:Plan is for a Left Total Hip Replacement - Anterior Approach by Dr. Wynelle Link.  Plan is to go home.  PCP - Dr. Carolyn Stare - Patient has been seen preoperatively and felt to  be stable for surgery.  The patient does not have any contraindications and will receive TXA (tranexamic acid) prior to surgery.  Signed electronically by Ok Edwards, III PA-C

## 2013-07-28 NOTE — Progress Notes (Signed)
Dr. Winfred Leeds made aware of patient's vital signs

## 2013-07-28 NOTE — Progress Notes (Signed)
OR called re; Vancomycin for early pick up

## 2013-07-28 NOTE — Anesthesia Postprocedure Evaluation (Signed)
  Anesthesia Post-op Note  Patient: [name]  Procedure(s) Performed: Procedure(s): LEFT TOTAL HIP ARTHROPLASTY ANTERIOR APPROACH (Left)  Patient Location: PACU  Anesthesia Type:General  Level of Consciousness: awake, alert  and oriented  Airway and Oxygen Therapy: Patient Spontanous Breathing  Post-op Pain: mild  Post-op Assessment: Patient's Cardiovascular Status Stable  Post-op Vital Signs: stable  Last Vitals:  Filed Vitals:   07/28/13 1452  BP: 113/56  Pulse: 93  Temp: 37.3 C  Resp: 14    Complications: Rash with urticaria

## 2013-07-28 NOTE — Progress Notes (Signed)
Patient's throat getting better- voice still deep

## 2013-07-28 NOTE — Evaluation (Signed)
Physical Therapy Evaluation Patient Details Name: [name] MRN: [id number] DOB: 1950-09-11 Today's Date: 07/28/2013   History of Present Illness  L THR 05/28/13; R AKA - s/p ~40 YRS  Clinical Impression  Pt s/p L THR presents with decreased L LE strength/ROM, post op pain, and premorbid R AKA limiting functional mobility.  Pt currently requires significant assist of 2 to safely perform basic mobility and would greatly benefit from follow up rehab at CIR level to maximize IND/saftey prior to return home    Follow Up Recommendations CIR    Equipment Recommendations  None recommended by PT    Recommendations for Other Services OT consult     Precautions / Restrictions Precautions Precautions: Fall Precaution Comments: R AKA - no prosthesis Restrictions Weight Bearing Restrictions: No Other Position/Activity Restrictions: WBAT      Mobility  Bed Mobility Overal bed mobility: Needs Assistance Bed Mobility: Supine to Sit;Sit to Supine     Supine to sit: Min assist;Mod assist Sit to supine: Min assist;Mod assist   General bed mobility comments: cues for sequence and use of UEs to self assist  Transfers Overall transfer level: Needs assistance Equipment used: Rolling walker (2 wheeled) Transfers: Sit to/from Stand Sit to Stand: +2 physical assistance;Mod assist         General transfer comment: cues for LE management and use of UEs to self assist  Ambulation/Gait Ambulation/Gait assistance: +2 physical assistance;+2 safety/equipment;Mod assist;Max assist Ambulation Distance (Feet): 4 Feet Assistive device: Rolling walker (2 wheeled) Gait Pattern/deviations: Step-to pattern;Decreased step length - right;Decreased step length - left;Shuffle;Trunk flexed     General Gait Details: cues for sequence, posture and position from RW: physical assist for balance and support - increased with each step  Stairs            Wheelchair Mobility    Modified Rankin  (Stroke Patients Only)       Balance                                             Pertinent Vitals/Pain 3/10; premed, ice packs provided    Home Living Family/patient expects to be discharged to:: Inpatient rehab Living Arrangements: Spouse/significant other                    Prior Function Level of Independence: Independent with assistive device(s)         Comments: used crutches but has been very sedentary x ~2 yrs 2* hip pain     Hand Dominance   Dominant Hand: Right    Extremity/Trunk Assessment   Upper Extremity Assessment: Overall WFL for tasks assessed           Lower Extremity Assessment: RLE deficits/detail;LLE deficits/detail RLE Deficits / Details: AKA LLE Deficits / Details: 2+/5 hip strength with AAROM at hip to 90 flex and 30 abd  Cervical / Trunk Assessment: Normal  Communication   Communication: No difficulties  Cognition Arousal/Alertness: Awake/alert Behavior During Therapy: WFL for tasks assessed/performed Overall Cognitive Status: Within Functional Limits for tasks assessed                      General Comments      Exercises Total Joint Exercises Ankle Circles/Pumps: AROM;Left;15 reps;Supine Quad Sets: AROM;Left;10 reps;Supine Heel Slides: AAROM;Left;15 reps;Supine Hip ABduction/ADduction: AAROM;Left;10 reps;Supine      Assessment/Plan  PT Assessment Patient needs continued PT services  PT Diagnosis Difficulty walking   PT Problem List Decreased strength;Decreased range of motion;Decreased activity tolerance;Decreased mobility;Decreased balance;Decreased knowledge of use of DME;Pain;Obesity  PT Treatment Interventions DME instruction;Gait training;Stair training;Functional mobility training;Therapeutic activities;Therapeutic exercise;Patient/family education   PT Goals (Current goals can be found in the Care Plan section) Acute Rehab PT Goals Patient Stated Goal: Rehab and home to walk  without pain PT Goal Formulation: With patient Time For Goal Achievement: 08/04/13 Potential to Achieve Goals: Good    Frequency 7X/week   Barriers to discharge        Co-evaluation               End of Session Equipment Utilized During Treatment: Gait belt Activity Tolerance: Patient limited by fatigue Patient left: in bed;with call bell/phone within reach;with family/visitor present Nurse Communication: Mobility status         Time: 0211-1735 PT Time Calculation (min): 35 min   Charges:   PT Evaluation $Initial PT Evaluation Tier I: 1 Procedure PT Treatments $Gait Training: 8-22 mins $Therapeutic Exercise: 8-22 mins   PT G Codes:          Jaja Switalski 07/28/2013, 5:14 PM

## 2013-07-28 NOTE — Anesthesia Preprocedure Evaluation (Addendum)
Anesthesia Evaluation  Patient identified by MRN, date of birth, ID band Patient awake    Reviewed: Allergy & Precautions, H&P , NPO status , Patient's Chart, lab work & pertinent test results  History of Anesthesia Complications (+) PONV  Airway Mallampati: II TM Distance: >3 FB Neck ROM: Full    Dental  (+) Caps, Dental Advisory Given   Pulmonary neg pulmonary ROS, asthma , sleep apnea , former smoker,  breath sounds clear to auscultation  Pulmonary exam normal       Cardiovascular negative cardio ROS  Rhythm:Regular Rate:Normal     Neuro/Psych Anxiety Depression negative neurological ROS  negative psych ROS   GI/Hepatic negative GI ROS, Neg liver ROS, hiatal hernia, GERD-  ,  Endo/Other  negative endocrine ROS  Renal/GU negative Renal ROS  negative genitourinary   Musculoskeletal negative musculoskeletal ROS (+)   Abdominal   Peds negative pediatric ROS (+)  Hematology negative hematology ROS (+)   Anesthesia Other Findings   Reproductive/Obstetrics                          Anesthesia Physical Anesthesia Plan  ASA: II  Anesthesia Plan: General   Post-op Pain Management:    Induction: Intravenous  Airway Management Planned: Oral ETT  Additional Equipment:   Intra-op Plan:   Post-operative Plan: Extubation in OR  Informed Consent: I have reviewed the patients History and Physical, chart, labs and discussed the procedure including the risks, benefits and alternatives for the proposed anesthesia with the patient or authorized representative who has indicated his/her understanding and acceptance.   Dental advisory given  Plan Discussed with: CRNA  Anesthesia Plan Comments:         Anesthesia Quick Evaluation

## 2013-07-28 NOTE — Progress Notes (Signed)
Benadryl 25 mg IVP repeated

## 2013-07-28 NOTE — Op Note (Signed)
OPERATIVE REPORT  PREOPERATIVE DIAGNOSIS: Osteoarthritis of the Left hip.   POSTOPERATIVE DIAGNOSIS: Osteoarthritis of the Left  hip.   PROCEDURE: Left total hip arthroplasty, anterior approach.   SURGEON: Gaynelle Arabian, MD   ASSISTANT: Arlee Muslim, PA-C  ANESTHESIA:  General  ESTIMATED BLOOD LOSS:- 300 ml  DRAINS: Hemovac x1.   COMPLICATIONS: None   CONDITION: PACU - hemodynamically stable.   BRIEF CLINICAL NOTE: [name] is a 63 y.o. female who has advanced end-  stage arthritis of his Left  hip with progressively worsening pain and  dysfunction.The patient has failed nonoperative management and presents for  total hip arthroplasty.   PROCEDURE IN DETAIL: After successful administration of spinal  anesthetic, the traction boots for the [facility] bed were placed on both  feet and the patient was placed onto the [facility] bed, boots placed into the leg  holders. The Left hip was then isolated from the perineum with plastic  drapes and prepped and draped in the usual sterile fashion. ASIS and  greater trochanter were marked and a oblique incision was made, starting  at about 1 cm lateral and 2 cm distal to the ASIS and coursing towards  the anterior cortex of the femur. The skin was cut with a 10 blade  through subcutaneous tissue to the level of the fascia overlying the  tensor fascia lata muscle. The fascia was then incised in line with the  incision at the junction of the anterior third and posterior 2/3rd. The  muscle was teased off the fascia and then the interval between the TFL  and the rectus was developed. The Hohmann retractor was then placed at  the top of the femoral neck over the capsule. The vessels overlying the  capsule were cauterized and the fat on top of the capsule was removed.  A Hohmann retractor was then placed anterior underneath the rectus  femoris to give exposure to the entire anterior capsule. A T-shaped  capsulotomy was performed. The  edges were tagged and the femoral head  was identified.       Osteophytes are removed off the superior acetabulum.  The femoral neck was then cut in situ with an oscillating saw. Traction  was then applied to the left lower extremity utilizing the [facility]  traction. The femoral head was then removed. Retractors were placed  around the acetabulum and then circumferential removal of the labrum was  performed. Osteophytes were also removed. Reaming starts at 43 mm to  medialize and  Increased in 2 mm increments to 47 mm. We reamed in  approximately 40 degrees of abduction, 20 degrees anteversion. A 48 mm  pinnacle acetabular shell was then impacted in anatomic position under  fluoroscopic guidance with excellent purchase. We did not need to place  any additional dome screws. A 28 mm neutral + 4 marathon liner was then  placed into the acetabular shell.       The femoral lift was then placed along the lateral aspect of the femur  just distal to the vastus ridge. The leg was  externally rotated and capsule  was stripped off the inferior aspect of the femoral neck down to the  level of the lesser trochanter, this was done with electrocautery. The femur was lifted after this was performed. The  leg was then placed and extended in adducted position to essentially delivering the femur. We also removed the capsule superiorly and the  piriformis from the piriformis  fossa to gain excellent exposure of the  proximal femur. Rongeur was used to remove some cancellous bone to get  into the lateral portion of the proximal femur for placement of the  initial starter reamer. The starter broaches was placed  the starter broach  and was shown to go down the center of the canal. Broaching  with the  Corail system was then performed starting at size 8, coursing  Up to size 9. A size 9 had excellent torsional and rotational  and axial stability. The trial standard offset neck was then placed  with a 28 + 1.5 trial  head. The hip was then reduced. We confirmed that  the stem was in the canal both on AP and lateral x-rays. It also has excellent sizing. The hip was reduced with outstanding stability through full extension, full external rotation,  and then flexion in adduction internal rotation. AP pelvis was taken  and the leg lengths were measured and found to be exactly equal. Hip  was then dislocated again and the femoral head and neck removed. The  femoral broach was removed. Size 9 Corail stem with a standard offset  neck was then impacted into the femur following native anteversion. Has  excellent purchase in the canal. Excellent torsional and rotational and  axial stability. It is confirmed to be in the canal on AP and lateral  fluoroscopic views. The 28 + 1.5 ceramic head was placed and the hip  reduced with outstanding stability. Again AP pelvis was taken and it  confirmed that the leg lengths were equal. The wound was then copiously  irrigated with saline solution and the capsule reattached and repaired  with Ethibond suture.  20 mL of Exparel mixed with 50 mL of saline then additional 20 ml of .25% Bupivicaine injected into the capsule and into the edge of the tensor fascia lata as well as subcutaneous tissue. The fascia overlying the tensor fascia lata was  then closed with a running #1 V-Loc. Subcu was closed with interrupted  2-0 Vicryl and subcuticular running 4-0 Monocryl. Incision was cleaned  and dried. Steri-Strips and a bulky sterile dressing applied. Hemovac  drain was hooked to suction and then he was awakened and transported to  recovery in stable condition.        Please note that a surgical assistant was a medical necessity for this procedure to perform it in a safe and expeditious manner. Assistant was necessary to provide appropriate retraction of vital neurovascular structures and to prevent femoral fracture and allow for anatomic placement of the prosthesis.  Gaynelle Arabian, M.D.

## 2013-07-28 NOTE — Progress Notes (Signed)
Portable AP Pelvis X-ray done. 

## 2013-07-28 NOTE — Progress Notes (Signed)
CSW met with pt / family to assist with d/c planning. Pt / family informed CSW that pt's insurance ( [place] ) does not have any SNF's in network with insurance within a 50 mile radius of their home. Insurance does cover acute in pt rehab but ( according to family ) insurance rep indicated pt will most likely not qualify for acute in pt rehab. Pt's spouse will be home from work for 2 weeks following pt's hospital d/c. At this point pt is planning to d/c home. CSW will meet again with pt / family following PT Evaluation to review recommendations and continue to assist with d/c planning needs until d/c plan is finalized.  Werner Lean LCSW [phone]

## 2013-07-28 NOTE — Progress Notes (Signed)
Benadryl 25 mg IVP given after Dr. Winfred Leeds saw patient- she had stated she had some difficulty swallowing and itching on both palms of hands- pinkish- red rash noted on face, neck; palms of both hands, all fingers , and left lower arm- patient states she is itching on palms of hands-

## 2013-07-28 NOTE — Interval H&P Note (Signed)
History and Physical Interval Note:  07/28/2013 9:47 AM  [name]  has presented today for surgery, with the diagnosis of osteoarthritis of the left hip  The various methods of treatment have been discussed with the patient and family. After consideration of risks, benefits and other options for treatment, the patient has consented to  Procedure(s): LEFT TOTAL HIP ARTHROPLASTY ANTERIOR APPROACH (Left) as a surgical intervention .  The patient's history has been reviewed, patient examined, no change in status, stable for surgery.  I have reviewed the patient's chart and labs.  Questions were answered to the patient's satisfaction.     [name]

## 2013-07-28 NOTE — Progress Notes (Signed)
Hydrocortisone 100 mg IVP given as ordered by Dr. Winfred Leeds.

## 2013-07-28 NOTE — Progress Notes (Signed)
Dr. Winfred Leeds in to see patient- O.K. To go to floor-

## 2013-07-28 NOTE — Progress Notes (Signed)
Patient was in the hospital past PST visit. Due to abnormal EKG. Cleared after 3 days and discharged home after tests .

## 2013-07-28 NOTE — Progress Notes (Signed)
X-ray results noted 

## 2013-07-28 NOTE — Transfer of Care (Signed)
Immediate Anesthesia Transfer of Care Note  Patient: [name]  Procedure(s) Performed: Procedure(s) (LRB): LEFT TOTAL HIP ARTHROPLASTY ANTERIOR APPROACH (Left)  Patient Location: PACU  Anesthesia Type: General  Level of Consciousness: sedated, patient cooperative and responds to stimulation  Airway & Oxygen Therapy: Patient Spontanous Breathing and Patient connected to face mask oxgen  Post-op Assessment: Report given to PACU RN and Post -op Vital signs reviewed and stable  Post vital signs: Reviewed and stable  Complications: No apparent anesthesia complications

## 2013-07-29 ENCOUNTER — Encounter (HOSPITAL_COMMUNITY): Payer: Self-pay | Admitting: Orthopedic Surgery

## 2013-07-29 DIAGNOSIS — F32A Depression, unspecified: Secondary | ICD-10-CM

## 2013-07-29 DIAGNOSIS — L259 Unspecified contact dermatitis, unspecified cause: Secondary | ICD-10-CM

## 2013-07-29 DIAGNOSIS — D62 Acute posthemorrhagic anemia: Secondary | ICD-10-CM | POA: Diagnosis not present

## 2013-07-29 DIAGNOSIS — G4733 Obstructive sleep apnea (adult) (pediatric): Secondary | ICD-10-CM

## 2013-07-29 DIAGNOSIS — J45909 Unspecified asthma, uncomplicated: Secondary | ICD-10-CM | POA: Diagnosis present

## 2013-07-29 DIAGNOSIS — F329 Major depressive disorder, single episode, unspecified: Secondary | ICD-10-CM

## 2013-07-29 DIAGNOSIS — Z9289 Personal history of other medical treatment: Secondary | ICD-10-CM

## 2013-07-29 DIAGNOSIS — Z8589 Personal history of malignant neoplasm of other organs and systems: Secondary | ICD-10-CM

## 2013-07-29 DIAGNOSIS — F411 Generalized anxiety disorder: Secondary | ICD-10-CM

## 2013-07-29 DIAGNOSIS — G547 Phantom limb syndrome without pain: Secondary | ICD-10-CM | POA: Diagnosis present

## 2013-07-29 LAB — CBC
HEMATOCRIT: 27 % — AB (ref 36.0–46.0)
Hemoglobin: 8.9 g/dL — ABNORMAL LOW (ref 12.0–15.0)
MCH: 31.4 pg (ref 26.0–34.0)
MCHC: 33 g/dL (ref 30.0–36.0)
MCV: 95.4 fL (ref 78.0–100.0)
Platelets: 264 10*3/uL (ref 150–400)
RBC: 2.83 MIL/uL — ABNORMAL LOW (ref 3.87–5.11)
RDW: 12.7 % (ref 11.5–15.5)
WBC: 16.4 10*3/uL — ABNORMAL HIGH (ref 4.0–10.5)

## 2013-07-29 LAB — BASIC METABOLIC PANEL
Anion gap: 9 (ref 5–15)
BUN: 11 mg/dL (ref 6–23)
CALCIUM: 8.6 mg/dL (ref 8.4–10.5)
CHLORIDE: 106 meq/L (ref 96–112)
CO2: 25 mEq/L (ref 19–32)
CREATININE: 0.87 mg/dL (ref 0.50–1.10)
GFR calc non Af Amer: 70 mL/min — ABNORMAL LOW (ref >90)
GFR, EST AFRICAN AMERICAN: 81 mL/min — AB (ref >90)
Glucose, Bld: 156 mg/dL — ABNORMAL HIGH (ref 70–99)
Potassium: 4.5 mEq/L (ref 3.7–5.3)
Sodium: 140 mEq/L (ref 137–147)

## 2013-07-29 MED ORDER — POLYSACCHARIDE IRON COMPLEX 150 MG PO CAPS
150.0000 mg | ORAL_CAPSULE | Freq: Every day | ORAL | Status: DC
  Administered 2013-07-29 – 2013-07-30 (×2): 150 mg via ORAL
  Filled 2013-07-29 (×2): qty 1

## 2013-07-29 NOTE — Evaluation (Signed)
Occupational Therapy Evaluation Patient Details Name: [name] MRN: [id number] DOB: [birth year] Today's Date: 07/29/2013    History of Present Illness L THR 05/28/13; R AKA - s/p ~40 YRS   Clinical Impression   This 63 year old female was admitted for L DA THA.  She has a h/o R AKA.  Pt currently needs A x 2 for safety (min to mod A) for transfers and adls.  She will benefit from skilled OT to increase safety and independence with adls.  Goals are set for min guard in acute.  Pt was mod I prior to admission.  She is very motivated and would benefit from CIR to reach a mod I level, as she is home alone during the day.     Follow Up Recommendations  CIR    Equipment Recommendations   (possibly tub bench vs. sponge bathing)    Recommendations for Other Services       Precautions / Restrictions Precautions Precautions: Fall Precaution Comments: R AKA - no prosthesis Restrictions Other Position/Activity Restrictions: WBAT      Mobility Bed Mobility         Supine to sit: Min guard     General bed mobility comments: for safety  Transfers     Transfers: Sit to/from Stand Sit to Stand: +2 physical assistance;Min assist;From elevated surface         General transfer comment: cues for UE placement    Balance                                            ADL Overall ADL's : Needs assistance/impaired     Grooming: Set up;Sitting   Upper Body Bathing: Set up;Sitting   Lower Body Bathing: Moderate assistance;+2 for safety/equipment;Sit to/from stand   Upper Body Dressing : Set up;Sitting   Lower Body Dressing: Moderate assistance;+2 for safety/equipment;Sit to/from stand                 General ADL Comments: overlapped with PT.  Pt +2 for safety for sit to stand (min) and for ambulating with RW (mod).  Educated on safety for tub--recommend tub bench and discussed cheap shower curtain liner to keep water inside of tub and also only  bathing when someone is home with her.  Educated on AE:  she has a Secondary school teacher and would benefit from using this for LB dressing. Also discussed long sponge for increased safety.       Vision                     Perception     Praxis      Pertinent Vitals/Pain C/o burning sensation in hip. Repositioned which improved pain.  Pain was not rated     Hand Dominance Right   Extremity/Trunk Assessment Upper Extremity Assessment Upper Extremity Assessment: Overall WFL for tasks assessed           Communication Communication Communication: No difficulties   Cognition Arousal/Alertness: Awake/alert Behavior During Therapy: WFL for tasks assessed/performed Overall Cognitive Status: Within Functional Limits for tasks assessed:                       General Comments       Exercises       Shoulder Instructions      Home Living Family/patient expects to be discharged  to:: Inpatient rehab Living Arrangements: Spouse/significant other                               Additional Comments: has tub, high commodes--one higher than comfort height; doesn't have 24/7      Prior Functioning/Environment Level of Independence: Independent with assistive device(s)        Comments: used crutches but has been very sedentary x ~2 yrs 2* hip pain    OT Diagnosis: Generalized weakness   OT Problem List: Decreased activity tolerance;Decreased cognition;Decreased knowledge of use of DME or AE;Pain;Impaired balance (sitting and/or standing)   OT Treatment/Interventions: Self-care/ADL training;DME and/or AE instruction;Balance training;Patient/family education    OT Goals(Current goals can be found in the care plan section) Acute Rehab OT Goals Patient Stated Goal: Rehab and home to walk without pain OT Goal Formulation: With patient Time For Goal Achievement: 08/05/13 Potential to Achieve Goals: Good ADL Goals Pt Will Perform Grooming: with min guard  assist;standing (sit to stand for oral care) Pt Will Perform Lower Body Bathing: with min guard assist;with adaptive equipment;sit to/from stand Pt Will Perform Lower Body Dressing: with min guard assist;with adaptive equipment;sit to/from stand Pt Will Transfer to Toilet: with min guard assist;stand pivot transfer;bedside commode Pt Will Perform Toileting - Clothing Manipulation and hygiene: with min guard assist;sit to/from stand;sitting/lateral leans Pt Will Perform Tub/Shower Transfer: Shower transfer;with min assist;ambulating  OT Frequency: Min 2X/week   Barriers to D/C:            Co-evaluation PT/OT/SLP Co-Evaluation/Treatment: Yes (overlapped for mobility) Reason for Co-Treatment: For patient/therapist safety PT goals addressed during session: Mobility/safety with mobility OT goals addressed during session: ADL's and self-care      End of Session    Activity Tolerance: Patient tolerated treatment well Patient left: in chair;with call bell/phone within reach;with family/visitor present   Time: 1142-1208 OT Time Calculation (min): 26 min 1156- 1208 OT only Charges:  OT General Charges $OT Visit: 1 Procedure OT Evaluation $Initial OT Evaluation Tier I: 1 Procedure OT Treatments $Self Care/Home Management : 8-22 mins G-Codes:    Alante Tolan August 27, 2013, 12:31 PM  Lesle Chris, OTR/L [phone] 08-27-2013

## 2013-07-29 NOTE — Progress Notes (Signed)
Pt stated that she will have RN call RT to assist with CPAP when ready for bed. RT to monitor and assess as needed.

## 2013-07-29 NOTE — Consult Note (Signed)
Physical Medicine and Rehabilitation Consult  Reason for Consult: R-THR in patient with L-AKA Referring Physician: Dr. Wynelle Link   HPI: [name] is a 63 y.o. 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 She has failed conservative measures and elected to undergo L-THR by Dr. Wynelle Link on 07/28/13.  Post op with leucocytosis as well as ABLA. She is WBAT and on Xarelto for DVT prophylaxis. PT evaluation done yesterday showing patient requiring significant amount of assistance. CIR recommended by threapy and MD.   Patient reports that she has had problems with transfers and mobility for past 2 years. She sits in her recliner chair most of the day. Taking a shower wears her out for the day. Phantom pain RLE as well as sciatica are also a limiting factor limiting mobility at times.  Her husband takes care of managing the home and plans on taking two weeks off to assist past discharge.  Off IV pain meds secondary to hallucinations  Review of Systems  HENT: Negative for hearing loss.   Eyes: Negative for blurred vision and double vision.  Respiratory: Positive for shortness of breath (with minimal activity). Negative for cough.   Cardiovascular: Positive for chest pain (question due to esophageal spasms. ). Negative for leg swelling.  Gastrointestinal: Positive for heartburn and constipation. Negative for nausea.  Genitourinary: Positive for urgency and frequency.  Musculoskeletal: Positive for back pain, falls, joint pain and myalgias.  Neurological: Positive for sensory change and weakness. Negative for headaches.  Psychiatric/Behavioral: The patient is nervous/anxious.     Past Medical History  Diagnosis Date  . Complication of anesthesia     NAUSEA  . Chest pain     pt states due to esophageal problem -  primary care MD aware   . Hives     since pneumonia vaccine   . Asthma   . Depression   . Anxiety   . Fibromyalgia   . Spondylosis   . Bulging disc   . 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 Phantom limb pain     rt leg  . Leg pain, left   . Eczema     ears  . GERD (gastroesophageal reflux disease)     " alot of burping"  . Constipation, chronic   . Urgency incontinence   . History of transfusion   . Cancer     hx sarcoma rt knee with amputation  . Difficulty sleeping   . Sleep apnea     pt had recent sleep study done but does not know results - report called for and put on chart showing significant obstructive sleep apnea    Past Surgical History  Procedure Laterality Date  . Abdominal hysterectomy  1980'S  . Sarcoma  1973    RT KNEE (aka)  . External ear surgery  AGE 51  . Total hip arthroplasty Left 07/28/2013    Procedure: LEFT TOTAL HIP ARTHROPLASTY ANTERIOR APPROACH;  Surgeon: [name], MD;  Location: [facility];  Service: Orthopedics;  Laterality: Left;   Family History  Problem Relation Age of Onset  . CAD Sister     CABG, stents  . CAD Father     Social  History:   Married. Disabled due to AKA. Was independent with crutches PTA. She reports that she quit smoking about 6 years ago. She does not have any smokeless tobacco history on file. She reports that she does not drink alcohol or use illicit drugs.   Allergies  Allergen Reactions  . Bactrim [Sulfamethoxazole-Tmp Ds] Shortness Of Breath  . Cefuroxime Shortness Of Breath  . Pneumococcal Vaccines Hives    Hives on arm after shot  . Omnipaque [Iohexol]     Patient stated that she had a reaction a few years ago to the IV dye and had trouble breathing--so for her CT Angio Chest today (07/12/13), ER MD gave her solumedrol and Benadryl before she had test and did very well with that.  ak 07/12/13  . Penicillins Diarrhea  . Sulfa  Antibiotics Other (See Comments)    unknown   Medications Prior to Admission  Medication Sig Dispense Refill  . buPROPion (WELLBUTRIN SR) 150 MG 12 hr tablet Take 150 mg by mouth 2 (two) times daily.      . busPIRone (BUSPAR) 10 MG tablet Take 10 mg by mouth 2 (two) times daily.      . citalopram (CELEXA) 10 MG tablet Take 10 mg by mouth every morning.      . cyclobenzaprine (FLEXERIL) 10 MG tablet Take 10 mg by mouth 2 (two) times daily.       Marland Kitchen doxepin (SINEQUAN) 10 MG capsule Take 10 mg by mouth at bedtime.      Marland Kitchen esomeprazole (NEXIUM) 20 MG capsule Take 1 capsule (20 mg total) by mouth 2 (two) times daily before a meal.  60 capsule  0  . fexofenadine (ALLEGRA) 180 MG tablet Take 180 mg by mouth daily.      . irbesartan (AVAPRO) 150 MG tablet Take 150 mg by mouth 2 (two) times daily.      Marland Kitchen albuterol (PROVENTIL HFA;VENTOLIN HFA) 108 (90 BASE) MCG/ACT inhaler Inhale 2 puffs into the lungs every 6 (six) hours as needed for wheezing or shortness of breath.      . Cholecalciferol (VITAMIN D) 2000 UNITS tablet Take 2,000 Units by mouth daily.      . diphenhydrAMINE (BENADRYL) 25 MG tablet Take 1 tablet (25 mg total) by mouth every 6 (six) hours as needed for itching.  30 tablet  0  . fluocinolone (SYNALAR) 0.01 % external solution Place 2 drops into both ears 2 (two) times daily as needed (itching).      . predniSONE (DELTASONE) 50 MG tablet Take 1 tablet (50 mg total) by mouth daily with breakfast.  5 tablet  0    Home: Home Living Family/patient expects to be discharged to:: Inpatient rehab Living Arrangements: Spouse/significant other  Functional History: Prior Function Level of Independence: Independent with assistive device(s) Comments: used crutches but has been very sedentary x ~2 yrs 2* hip pain Functional Status:  Mobility: Bed Mobility Overal bed mobility: Needs Assistance Bed Mobility: Supine to Sit;Sit to Supine Supine to sit: Min assist;Mod assist Sit to supine: Min  assist;Mod assist General bed mobility comments: cues for sequence and use of UEs to self assist Transfers Overall transfer level: Needs assistance Equipment used: Rolling walker (2 wheeled) Transfers: Sit to/from Stand Sit to Stand: +2 physical assistance;Mod assist General transfer comment: cues for LE management and use of UEs to self assist Ambulation/Gait Ambulation/Gait assistance: +2 physical assistance;+2 safety/equipment;Mod assist;Max assist Ambulation Distance (Feet): 4 Feet Assistive device: Rolling walker (2 wheeled) Gait Pattern/deviations: Step-to pattern;Decreased  step length - right;Decreased step length - left;Shuffle;Trunk flexed General Gait Details: cues for sequence, posture and position from RW: physical assist for balance and support - increased with each step    ADL:    Cognition: Cognition Overall Cognitive Status: Within Functional Limits for tasks assessed Orientation Level: Oriented X4 Cognition Arousal/Alertness: Awake/alert Behavior During Therapy: WFL for tasks assessed/performed Overall Cognitive Status: Within Functional Limits for tasks assessed  Blood pressure 105/42, pulse 76, temperature 97.9 F (36.6 C), temperature source Axillary, resp. rate 18, height 5\' 2"  (1.575 m), weight 69.854 kg (154 lb), SpO2 99.00%. Physical Exam  Nursing note and vitals reviewed. Constitutional: She is oriented to person, place, and time. She appears well-developed and well-nourished.  HENT:  Head: Normocephalic and atraumatic.  Eyes: Conjunctivae are normal. Pupils are equal, round, and reactive to light.  Neck: Normal range of motion. Neck supple.  Cardiovascular: Normal rate and regular rhythm.   Respiratory: Effort normal and breath sounds normal. No respiratory distress. She has no wheezes.  GI: Soft. Bowel sounds are normal. She exhibits no distension. There is no tenderness.  Neurological: She is alert and oriented to person, place, and time. No cranial  nerve deficit.  Skin: Skin is warm and dry.  Psychiatric: Her speech is normal and behavior is normal. Thought content normal. Her mood appears anxious. Cognition and memory are normal.  Moto 5/5 in BUE 3- Left hip flex, Knee ext, 4/5 Left ankle DF MSK:  FROM in BUE, Right AKA stump at upper femur with abundant distal soft tissue  Results for orders placed during the hospital encounter of 07/28/13 (from the past 24 hour(s))  GLUCOSE, CAPILLARY     Status: Abnormal   Collection Time    07/28/13 12:58 PM      Result Value Ref Range   Glucose-Capillary 149 (*) 70 - 99 mg/dL   Comment 1 Documented in Chart    CBC     Status: Abnormal   Collection Time    07/29/13  4:36 AM      Result Value Ref Range   WBC 16.4 (*) 4.0 - 10.5 K/uL   RBC 2.83 (*) 3.87 - 5.11 MIL/uL   Hemoglobin 8.9 (*) 12.0 - 15.0 g/dL   HCT 27.0 (*) 36.0 - 46.0 %   MCV 95.4  78.0 - 100.0 fL   MCH 31.4  26.0 - 34.0 pg   MCHC 33.0  30.0 - 36.0 g/dL   RDW 12.7  11.5 - 15.5 %   Platelets 264  150 - 400 K/uL  BASIC METABOLIC PANEL     Status: Abnormal   Collection Time    07/29/13  4:36 AM      Result Value Ref Range   Sodium 140  137 - 147 mEq/L   Potassium 4.5  3.7 - 5.3 mEq/L   Chloride 106  96 - 112 mEq/L   CO2 25  19 - 32 mEq/L   Glucose, Bld 156 (*) 70 - 99 mg/dL   BUN 11  6 - 23 mg/dL   Creatinine, Ser 0.87  0.50 - 1.10 mg/dL   Calcium 8.6  8.4 - 10.5 mg/dL   GFR calc non Af Amer 70 (*) >90 mL/min   GFR calc Af Amer 81 (*) >90 mL/min   Anion gap 9  5 - 15   Dg Pelvis Portable  07/28/2013   CLINICAL DATA:  Osteoarthritis of the left hip.  EXAM: PORTABLE PELVIS 1-2 VIEWS  COMPARISON:  Radiograph dated 07/12/2013  FINDINGS: The patient has undergone left total hip prosthesis insertion. The components appear in excellent position in the AP projection. No fracture. Soft tissue drain in place.  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 Osteopenia of the right femur remnant.   IMPRESSION: Satisfactory appearance of the left hip in the AP projection after total hip prosthesis insertion.   Electronically Signed   By: Rozetta Nunnery M.D.   On: 07/28/2013 13:36   Dg C-arm 61-120 Min-no Report  07/28/2013   CLINICAL DATA: left anterior hip   C-ARM 61-120 MINUTES  Fluoroscopy was utilized by the requesting physician.  No radiographic  interpretation.     Assessment/Plan: Diagnosis: Left THR in a pt with chronic R AKA 1. Does the need for close, 24 hr/day medical supervision in concert with the patient's rehab needs make it unreasonable for this patient to be served in a less intensive setting? Potentially 2. Co-Morbidities requiring supervision/potential complications: FM,Phantom limb pain, post op anemia 3. Due to bladder management, bowel management, safety, skin/wound care, disease management, medication administration, pain management and patient education, does the patient require 24 hr/day rehab nursing? Yes 4. Does the patient require coordinated care of a physician, rehab nurse, PT (1-2 hrs/day, 5 days/week) and OT (1-2 hrs/day, 5 days/week) to address physical and functional deficits in the context of the above medical diagnosis(es)? Yes Addressing deficits in the following areas: balance, endurance, locomotion, strength, transferring, bathing, dressing, feeding, grooming and toileting 5. Can the patient actively participate in an intensive therapy program of at least 3 hrs of therapy per day at least 5 days per week? Yes 6. The potential for patient to make measurable gains while on inpatient rehab is excellent 7. Anticipated functional outcomes upon discharge from inpatient rehab are supervision  with PT, supervision with OT, n/a with SLP. 8. Estimated rehab length of stay to reach the above functional goals is: 7d 9. Does the patient have adequate social supports to accommodate these discharge functional goals? Yes 10. Anticipated D/C setting: Home 11. Anticipated  post D/C treatments: [place] therapy 12. Overall Rehab/Functional Prognosis: excellent  RECOMMENDATIONS: This patient's condition is appropriate for continued rehabilitative care in the following setting: CIR Patient has agreed to participate in recommended program. Yes Note that insurance prior authorization may be required for reimbursement for recommended care.  Comment: should be ready POD #2 or 3 if ok with Ortho    07/29/2013

## 2013-07-29 NOTE — Progress Notes (Signed)
Physical Therapy Treatment Patient Details Name: [name] MRN: [id number] DOB: Sep 04, 1950 Today's Date: 07/29/2013    History of Present Illness L THR 05/28/13; R AKA - s/p ~40 YRS    PT Comments    Improvement in activity tolerance but with continued +2 assist for safety with OOB activity.  Follow Up Recommendations  CIR     Equipment Recommendations  None recommended by PT    Recommendations for Other Services OT consult     Precautions / Restrictions Precautions Precautions: Fall Precaution Comments: R AKA - no prosthesis Restrictions Weight Bearing Restrictions: No Other Position/Activity Restrictions: WBAT    Mobility  Bed Mobility Overal bed mobility: Needs Assistance Bed Mobility: Supine to Sit     Supine to sit: Min guard     General bed mobility comments: for safety  Transfers Overall transfer level: Needs assistance Equipment used: Rolling walker (2 wheeled) Transfers: Sit to/from Stand Sit to Stand: +2 physical assistance;Min assist;From elevated surface         General transfer comment: cues for UE placement  Ambulation/Gait Ambulation/Gait assistance: Min assist;Mod assist;+2 physical assistance Ambulation Distance (Feet): 25 Feet Assistive device: Rolling walker (2 wheeled) Gait Pattern/deviations: Step-to pattern;Decreased step length - right;Decreased step length - left;Shuffle Gait velocity: decr   General Gait Details: cues for sequence, posture and position from RW: physical assist for balance and support    Stairs            Wheelchair Mobility    Modified Rankin (Stroke Patients Only)       Balance                                    Cognition Arousal/Alertness: Awake/alert Behavior During Therapy: WFL for tasks assessed/performed Overall Cognitive Status: Within Functional Limits for tasks assessed                      Exercises Total Joint Exercises Ankle Circles/Pumps:  AROM;Left;15 reps;Supine Quad Sets: AROM;Left;10 reps;Supine Heel Slides: AAROM;Left;Supine;20 reps Hip ABduction/ADduction: AAROM;Left;Supine;15 reps    General Comments        Pertinent Vitals/Pain 4/10; premed, ice pack provided    Home Living Family/patient expects to be discharged to:: Inpatient rehab Living Arrangements: Spouse/significant other             Additional Comments: has tub, high commodes--one higher than comfort height; doesn't have 24/7    Prior Function Level of Independence: Independent with assistive device(s)      Comments: used crutches but has been very sedentary x ~2 yrs 2* hip pain   PT Goals (current goals can now be found in the care plan section) Acute Rehab PT Goals Patient Stated Goal: Rehab and home to walk without pain PT Goal Formulation: With patient Time For Goal Achievement: 08/04/13 Potential to Achieve Goals: Good Progress towards PT goals: Progressing toward goals    Frequency  7X/week    PT Plan Current plan remains appropriate    Co-evaluation PT/OT/SLP Co-Evaluation/Treatment: Yes Reason for Co-Treatment: For patient/therapist safety PT goals addressed during session: Mobility/safety with mobility OT goals addressed during session: ADL's and self-care     End of Session Equipment Utilized During Treatment: Gait belt Activity Tolerance: Patient tolerated treatment well;Patient limited by fatigue Patient left: in chair;with call bell/phone within reach;with family/visitor present     Time: 0932-3557 PT Time Calculation (min): 24 min  Charges:  $Gait  Training: 8-22 mins $Therapeutic Exercise: 8-22 mins                    G Codes:      [name] August 04, 2013, 1:00 PM

## 2013-07-29 NOTE — Progress Notes (Signed)
Pt placed on Auto CPAP 5-20 CMH20 via home FFM.  Pt tolerating well at this time, RT to monitor and assess as needed.

## 2013-07-29 NOTE — Progress Notes (Signed)
RT placed patient on CPAP with auto titrate setting of min 5 cmH2O and max 15 cmH2O. Patient is wearing mask from home. Water chamber is filled with sterile water for humidification. Patient is tolerating well. RT will monitor as needed.

## 2013-07-29 NOTE — Discharge Instructions (Addendum)
°Dr. Frank Aluisio °Total Joint Specialist °[facility] °[address] °[place], [place] [address] °[phone] ° ° ° °ANTERIOR APPROACH TOTAL HIP REPLACEMENT POSTOPERATIVE DIRECTIONS ° ° °Hip Rehabilitation, Guidelines Following Surgery  °The results of a hip operation are greatly improved after range of motion and muscle strengthening exercises. Follow all safety measures which are given to protect your hip. If any of these exercises cause increased pain or swelling in your joint, decrease the amount until you are comfortable again. Then slowly increase the exercises. Call your caregiver if you have problems or questions.  °HOME CARE INSTRUCTIONS  °Most of the following instructions are designed to prevent the dislocation of your new hip.  °Remove items at home which could result in a fall. This includes throw rugs or furniture in walking pathways.  °Continue medications as instructed at time of discharge. °· You may have some home medications which will be placed on hold until you complete the course of blood thinner medication. °· You may start showering once you are discharged home but do not submerge the incision under water. Just pat the incision dry and apply a dry gauze dressing on daily. °Do not put on socks or shoes without following the instructions of your caregivers.  °Sit on high chairs which makes it easier to stand.  °Sit on chairs with arms. Use the chair arms to help push yourself up when arising.  °Keep your leg on the side of the operation out in front of you when standing up.  °Arrange for the use of a toilet seat elevator so you are not sitting low.   °· Walk with walker as instructed.  °You may resume a sexual relationship in one month or when given the OK by your caregiver.  °Use walker as long as suggested by your caregivers.  °You may put full weight on your legs and walk as much as is comfortable. °Avoid periods of inactivity such as sitting longer than an hour  when not asleep. This helps prevent blood clots.  °You may return to work once you are cleared by your surgeon.  °Do not drive a car for 6 weeks or until released by your surgeon.  °Do not drive while taking narcotics.  °Wear elastic stockings for three weeks following surgery during the day but you may remove then at night.  °Make sure you keep all of your appointments after your operation with all of your doctors and caregivers. You should call the office at the above phone number and make an appointment for approximately two weeks after the date of your surgery. °Change the dressing daily and reapply a dry dressing each time. °Please pick up a stool softener and laxative for home use as long as you are requiring pain medications. °· Continue to use ice on the hip for pain and swelling from surgery. You may notice swelling that will progress down to the foot and ankle.  This is normal after  surgery.  Elevate the leg when you are not up walking on it.   °It is important for you to complete the blood thinner medication as prescribed by your doctor. °· Continue to use the breathing machine which will help keep your temperature down.  It is common for your temperature to cycle up and down following surgery, especially at night when you are not up moving around and exerting yourself.  The breathing machine keeps your lungs expanded and your temperature down. ° °RANGE OF MOTION AND STRENGTHENING EXERCISES  °  These exercises are designed to help you keep full movement of your hip joint. Follow your caregiver's or physical therapist's instructions. Perform all exercises about fifteen times, three times per day or as directed. Exercise both hips, even if you have had only one joint replacement. These exercises can be done on a training (exercise) mat, on the floor, on a table or on a bed. Use whatever works the best and is most comfortable for you. Use music or television while you are exercising so that the exercises are  a pleasant break in your day. This will make your life better with the exercises acting as a break in routine you can look forward to.  Lying on your back, slowly slide your foot toward your buttocks, raising your knee up off the floor. Then slowly slide your foot back down until your leg is straight again.  Lying on your back spread your legs as far apart as you can without causing discomfort.  Lying on your side, raise your upper leg and foot straight up from the floor as far as is comfortable. Slowly lower the leg and repeat.  Lying on your back, tighten up the muscle in the front of your thigh (quadriceps muscles). You can do this by keeping your leg straight and trying to raise your heel off the floor. This helps strengthen the largest muscle supporting your knee.  Lying on your back, tighten up the muscles of your buttocks both with the legs straight and with the knee bent at a comfortable angle while keeping your heel on the floor.   SKILLED REHAB INSTRUCTIONS: If the patient is transferred to a skilled rehab facility following release from the hospital, a list of the current medications will be sent to the facility for the patient to continue.  When discharged from the skilled rehab facility, please have the facility set up the patient's [place] prior to being released. Also, the skilled facility will be responsible for providing the patient with their medications at time of release from the facility to include their pain medication, the muscle relaxants, and their blood thinner medication. If the patient is still at the rehab facility at time of the two week follow up appointment, the skilled rehab facility will also need to assist the patient in arranging follow up appointment in our office and any transportation needs.  MAKE SURE YOU:  Understand these instructions.  Will watch your condition.  Will get help right away if you are not doing well or get worse.  Pick up  stool softner and laxative for home. Do not submerge incision under water. May shower. Continue to use ice for pain and swelling from surgery. Total Hip Protocol.  Take Xarelto for two and a half more weeks, then discontinue Xarelto. Once the patient has completed the blood thinner regimen, then take a Baby 81 mg Aspirin daily for three more weeks.  When discharged from the skilled rehab facility, please have the facility set up the patient's [place] prior to being released.  Also provide the patient with their medications at time of release from the facility to include their pain medication, the muscle relaxants, and their blood thinner medication.  If the patient is still at the rehab facility at time of follow up appointment, please also assist the patient in arranging follow up appointment in our office and any transportation needs.   Information on my medicine - XARELTO (Rivaroxaban)  This medication education was reviewed with  me or my healthcare representative as part of my discharge preparation.  The pharmacist that spoke with me during my hospital stay was:  Absher, Julieta Bellini, RPH  Why was Xarelto prescribed for you? Xarelto was prescribed for you to reduce the risk of blood clots forming after orthopedic surgery. The medical term for these abnormal blood clots is venous thromboembolism (VTE).  What do you need to know about xarelto ? Take your Xarelto ONCE DAILY at the same time every day. You may take it either with or without food.  If you have difficulty swallowing the tablet whole, you may crush it and mix in applesauce just prior to taking your dose.  Take Xarelto exactly as prescribed by your doctor and DO NOT stop taking Xarelto without talking to the doctor who prescribed the medication.  Stopping without other VTE prevention medication to take the place of Xarelto may increase your risk of developing a clot.  After discharge, you should have  regular check-up appointments with your healthcare provider that is prescribing your Xarelto.    What do you do if you miss a dose? If you miss a dose, take it as soon as you remember on the same day then continue your regularly scheduled once daily regimen the next day. Do not take two doses of Xarelto on the same day.   Important Safety Information A possible side effect of Xarelto is bleeding. You should call your healthcare provider right away if you experience any of the following:   Bleeding from an injury or your nose that does not stop.   Unusual colored urine (red or dark brown) or unusual colored stools (red or black).   Unusual bruising for unknown reasons.   A serious fall or if you hit your head (even if there is no bleeding).  Some medicines may interact with Xarelto and might increase your risk of bleeding while on Xarelto. To help avoid this, consult your healthcare provider or pharmacist prior to using any new prescription or non-prescription medications, including herbals, vitamins, non-steroidal anti-inflammatory drugs (NSAIDs) and supplements.  This website has more information on Xarelto: [email or web address]

## 2013-07-29 NOTE — Progress Notes (Signed)
OT Cancellation Note  Patient Details Name: [name] MRN: [id number] DOB: 1950/05/09   Cancelled Treatment:    Reason Eval/Treat Not Completed: Other (comment).  Pt has low BP at this time per RN.   Will check back later.    [name] 07/29/2013, 9:48 AM Lesle Chris, OTR/L [phone] 07/29/2013

## 2013-07-29 NOTE — Progress Notes (Signed)
Subjective: 1 Day Post-Op Procedure(s) (LRB): LEFT TOTAL HIP ARTHROPLASTY ANTERIOR APPROACH (Left) Patient reports pain as mild.   Patient seen in rounds with Dr. Wynelle Link.  Got some rest last night.  Walked yesterday with therapy. Patient is well, but has had some minor complaints of pain in the hip, requiring pain medications We will start therapy today.  Plan is to go Home versus CIR after hospital stay.  Objective: Vital signs in last 24 hours: Temp:  [97.7 F (36.5 C)-99.1 F (37.3 C)] 97.9 F (36.6 C) (07/16 0459) Pulse Rate:  [75-112] 76 (07/16 0459) Resp:  [8-24] 18 (07/16 0600) BP: (95-149)/(42-82) 105/42 mmHg (07/16 0459) SpO2:  [96 %-100 %] 99 % (07/16 0600) Weight:  [69.854 kg (154 lb)-70.024 kg (154 lb 6 oz)] 69.854 kg (154 lb) (07/15 1452)  Intake/Output from previous day:  Intake/Output Summary (Last 24 hours) at 07/29/13 0834 Last data filed at 07/29/13 0600  Gross per 24 hour  Intake 3987.5 ml  Output   4005 ml  Net  -17.5 ml    Intake/Output this shift: UOP 1305 since MN -17  Labs:  Recent Labs  07/29/13 0436  HGB 8.9*    Recent Labs  07/29/13 0436  WBC 16.4*  RBC 2.83*  HCT 27.0*  PLT 264    Recent Labs  07/29/13 0436  NA 140  K 4.5  CL 106  CO2 25  BUN 11  CREATININE 0.87  GLUCOSE 156*  CALCIUM 8.6   No results found for this basename: LABPT, INR,  in the last 72 hours  EXAM General - Patient is Alert, Appropriate and Oriented Extremity - Neurovascular intact Sensation intact distally Dorsiflexion/Plantar flexion intact Dressing - dressing C/D/I Motor Function - intact, moving foot and toes well on exam.  Hemovac pulled without difficulty.  Past Medical History  Diagnosis Date  . Complication of anesthesia     NAUSEA  . Chest pain     pt states due to esophageal problem -  primary care MD aware   . Hives     since pneumonia vaccine   . Asthma   . Depression   . Anxiety   . Fibromyalgia   . Spondylosis   .  Bulging disc   . 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 Phantom limb pain     rt leg  . Leg pain, left   . Eczema     ears  . GERD (gastroesophageal reflux disease)     " alot of burping"  . Constipation, chronic   . Urgency incontinence   . History of transfusion   . Cancer     hx sarcoma rt knee with amputation  . Difficulty sleeping   . Sleep apnea     pt had recent sleep study done but does not know results - report called for and put on chart showing significant obstructive sleep apnea    Assessment/Plan: 1 Day Post-Op Procedure(s) (LRB): LEFT TOTAL HIP ARTHROPLASTY ANTERIOR APPROACH (Left) Principal Problem:   OA (osteoarthritis) of hip Active Problems:   Fibromyalgia   GERD (gastroesophageal reflux disease)   Hiatal hernia   Postoperative anemia due to acute blood loss   Unspecified asthma(493.90)   Depression   Anxiety state, unspecified   History of Sarcoma, Bone   Phantom limb (syndrome)   Eczema   History of transfusion   Obstructive sleep apnea  Estimated body mass index is 28.16 kg/(m^2) as calculated  from the following:   Height as of this encounter: 5\' 2"  (1.575 m).   Weight as of this encounter: 69.854 kg (154 lb). Advance diet Up with therapy Start Iron  DVT Prophylaxis - Xarelto Weight Bearing As Tolerated left Leg Hemovac Pulled Begin Therapy   Alejandra Muslim, PA-C Orthopaedic Surgery 07/29/2013, 8:34 AM

## 2013-07-29 NOTE — Progress Notes (Signed)
Physical Therapy Treatment Patient Details Name: [name] MRN: [id number] DOB: 1950-09-10 Today's Date: 08-14-2013    History of Present Illness L THR 05/28/13; R AKA - s/p ~40 YRS    PT Comments      Follow Up Recommendations  CIR     Equipment Recommendations  None recommended by PT    Recommendations for Other Services OT consult     Precautions / Restrictions Precautions Precautions: Fall Precaution Comments: R AKA - no prosthesis Restrictions Weight Bearing Restrictions: No Other Position/Activity Restrictions: WBAT    Mobility  Bed Mobility Overal bed mobility: Needs Assistance Bed Mobility: Supine to Sit;Sit to Supine     Supine to sit: Min guard Sit to supine: Min guard   General bed mobility comments: for safety  Transfers Overall transfer level: Needs assistance Equipment used: Rolling walker (2 wheeled) Transfers: Sit to/from Stand Sit to Stand: Min assist;From elevated surface         General transfer comment: cues for UE placement  Ambulation/Gait Ambulation/Gait assistance: +2 physical assistance;Min assist;Mod assist Ambulation Distance (Feet): 24 Feet (twice) Assistive device: Rolling walker (2 wheeled) Gait Pattern/deviations: Step-to pattern;Decreased step length - right;Decreased step length - left;Shuffle Gait velocity: decr   General Gait Details: cues for sequence, posture and position from RW: physical assist for balance and support    Stairs            Wheelchair Mobility    Modified Rankin (Stroke Patients Only)       Balance                                    Cognition Arousal/Alertness: Awake/alert Behavior During Therapy: WFL for tasks assessed/performed Overall Cognitive Status: Within Functional Limits for tasks assessed                      Exercises Total Joint Exercises Ankle Circles/Pumps: AROM;Left;15 reps;Supine Quad Sets: AROM;Left;10 reps;Supine Heel Slides:  AAROM;Left;Supine;20 reps Hip ABduction/ADduction: AAROM;Left;Supine;15 reps Long Arc Quad: AROM;Left;10 reps;Seated    General Comments        Pertinent Vitals/Pain 4/10; ice packs provided    Home Living                      Prior Function            PT Goals (current goals can now be found in the care plan section) Acute Rehab PT Goals Patient Stated Goal: Rehab and home to walk without pain PT Goal Formulation: With patient Time For Goal Achievement: 08/04/13 Potential to Achieve Goals: Good Progress towards PT goals: Progressing toward goals    Frequency  7X/week    PT Plan Current plan remains appropriate    Co-evaluation             End of Session Equipment Utilized During Treatment: Gait belt Activity Tolerance: Patient tolerated treatment well;Patient limited by fatigue Patient left: in bed;with call bell/phone within reach;with family/visitor present     Time: 7829-5621 PT Time Calculation (min): 33 min  Charges:  $Gait Training: 8-22 mins $Therapeutic Exercise: 8-22 mins                    G Codes:      [name] 14-Aug-2013, 4:44 PM

## 2013-07-30 ENCOUNTER — Encounter (HOSPITAL_COMMUNITY): Payer: Self-pay | Admitting: *Deleted

## 2013-07-30 ENCOUNTER — Inpatient Hospital Stay (HOSPITAL_COMMUNITY)
Admission: AD | Admit: 2013-07-30 | Discharge: 2013-08-06 | DRG: 945 | Disposition: A | Discharge status: Home Health | Payer: BC Managed Care – PPO | Source: Intra-hospital | Attending: Physical Medicine & Rehabilitation | Admitting: Physical Medicine & Rehabilitation

## 2013-07-30 DIAGNOSIS — M167 Other unilateral secondary osteoarthritis of hip: Secondary | ICD-10-CM

## 2013-07-30 DIAGNOSIS — Z91199 Patient's noncompliance with other medical treatment and regimen due to unspecified reason: Secondary | ICD-10-CM

## 2013-07-30 DIAGNOSIS — IMO0001 Reserved for inherently not codable concepts without codable children: Secondary | ICD-10-CM

## 2013-07-30 DIAGNOSIS — Z91041 Radiographic dye allergy status: Secondary | ICD-10-CM

## 2013-07-30 DIAGNOSIS — M1610 Unilateral primary osteoarthritis, unspecified hip: Secondary | ICD-10-CM | POA: Diagnosis present

## 2013-07-30 DIAGNOSIS — D62 Acute posthemorrhagic anemia: Secondary | ICD-10-CM

## 2013-07-30 DIAGNOSIS — Z8249 Family history of ischemic heart disease and other diseases of the circulatory system: Secondary | ICD-10-CM

## 2013-07-30 DIAGNOSIS — G4733 Obstructive sleep apnea (adult) (pediatric): Secondary | ICD-10-CM | POA: Diagnosis present

## 2013-07-30 DIAGNOSIS — J45909 Unspecified asthma, uncomplicated: Secondary | ICD-10-CM | POA: Diagnosis present

## 2013-07-30 DIAGNOSIS — D72829 Elevated white blood cell count, unspecified: Secondary | ICD-10-CM | POA: Diagnosis present

## 2013-07-30 DIAGNOSIS — M169 Osteoarthritis of hip, unspecified: Secondary | ICD-10-CM

## 2013-07-30 DIAGNOSIS — Z887 Allergy status to serum and vaccine status: Secondary | ICD-10-CM

## 2013-07-30 DIAGNOSIS — K2100 Gastro-esophageal reflux disease with esophagitis, without bleeding: Secondary | ICD-10-CM

## 2013-07-30 DIAGNOSIS — S78119A Complete traumatic amputation at level between unspecified hip and knee, initial encounter: Secondary | ICD-10-CM

## 2013-07-30 DIAGNOSIS — Z96649 Presence of unspecified artificial hip joint: Secondary | ICD-10-CM

## 2013-07-30 DIAGNOSIS — F329 Major depressive disorder, single episode, unspecified: Secondary | ICD-10-CM | POA: Diagnosis present

## 2013-07-30 DIAGNOSIS — F32A Depression, unspecified: Secondary | ICD-10-CM | POA: Diagnosis present

## 2013-07-30 DIAGNOSIS — Z7901 Long term (current) use of anticoagulants: Secondary | ICD-10-CM

## 2013-07-30 DIAGNOSIS — R350 Frequency of micturition: Secondary | ICD-10-CM | POA: Diagnosis not present

## 2013-07-30 DIAGNOSIS — Z79899 Other long term (current) drug therapy: Secondary | ICD-10-CM

## 2013-07-30 DIAGNOSIS — Z880 Allergy status to penicillin: Secondary | ICD-10-CM

## 2013-07-30 DIAGNOSIS — Z9119 Patient's noncompliance with other medical treatment and regimen: Secondary | ICD-10-CM

## 2013-07-30 DIAGNOSIS — F411 Generalized anxiety disorder: Secondary | ICD-10-CM | POA: Diagnosis present

## 2013-07-30 DIAGNOSIS — K5909 Other constipation: Secondary | ICD-10-CM | POA: Diagnosis present

## 2013-07-30 DIAGNOSIS — F3289 Other specified depressive episodes: Secondary | ICD-10-CM | POA: Diagnosis present

## 2013-07-30 DIAGNOSIS — Z881 Allergy status to other antibiotic agents status: Secondary | ICD-10-CM

## 2013-07-30 DIAGNOSIS — Z882 Allergy status to sulfonamides status: Secondary | ICD-10-CM

## 2013-07-30 DIAGNOSIS — G547 Phantom limb syndrome without pain: Secondary | ICD-10-CM

## 2013-07-30 DIAGNOSIS — K219 Gastro-esophageal reflux disease without esophagitis: Secondary | ICD-10-CM | POA: Diagnosis present

## 2013-07-30 DIAGNOSIS — T4275XA Adverse effect of unspecified antiepileptic and sedative-hypnotic drugs, initial encounter: Secondary | ICD-10-CM | POA: Diagnosis present

## 2013-07-30 DIAGNOSIS — M797 Fibromyalgia: Secondary | ICD-10-CM

## 2013-07-30 DIAGNOSIS — Z5189 Encounter for other specified aftercare: Principal | ICD-10-CM

## 2013-07-30 DIAGNOSIS — L259 Unspecified contact dermatitis, unspecified cause: Secondary | ICD-10-CM | POA: Diagnosis present

## 2013-07-30 DIAGNOSIS — Z87891 Personal history of nicotine dependence: Secondary | ICD-10-CM

## 2013-07-30 HISTORY — DX: Essential (primary) hypertension: I10

## 2013-07-30 LAB — URINALYSIS, ROUTINE W REFLEX MICROSCOPIC
Bilirubin Urine: NEGATIVE
GLUCOSE, UA: NEGATIVE mg/dL
HGB URINE DIPSTICK: NEGATIVE
Ketones, ur: NEGATIVE mg/dL
Leukocytes, UA: NEGATIVE
Nitrite: NEGATIVE
Protein, ur: NEGATIVE mg/dL
SPECIFIC GRAVITY, URINE: 1.013 (ref 1.005–1.030)
Urobilinogen, UA: 0.2 mg/dL (ref 0.0–1.0)
pH: 6.5 (ref 5.0–8.0)

## 2013-07-30 LAB — CBC
HCT: 24.6 % — ABNORMAL LOW (ref 36.0–46.0)
HEMOGLOBIN: 8.2 g/dL — AB (ref 12.0–15.0)
MCH: 31.8 pg (ref 26.0–34.0)
MCHC: 33.3 g/dL (ref 30.0–36.0)
MCV: 95.3 fL (ref 78.0–100.0)
PLATELETS: 262 10*3/uL (ref 150–400)
RBC: 2.58 MIL/uL — ABNORMAL LOW (ref 3.87–5.11)
RDW: 13.1 % (ref 11.5–15.5)
WBC: 20.5 10*3/uL — ABNORMAL HIGH (ref 4.0–10.5)

## 2013-07-30 LAB — BASIC METABOLIC PANEL
Anion gap: 11 (ref 5–15)
BUN: 15 mg/dL (ref 6–23)
CO2: 25 mEq/L (ref 19–32)
Calcium: 9.1 mg/dL (ref 8.4–10.5)
Chloride: 106 mEq/L (ref 96–112)
Creatinine, Ser: 0.84 mg/dL (ref 0.50–1.10)
GFR, EST AFRICAN AMERICAN: 85 mL/min — AB (ref >90)
GFR, EST NON AFRICAN AMERICAN: 73 mL/min — AB (ref >90)
GLUCOSE: 135 mg/dL — AB (ref 70–99)
POTASSIUM: 3.9 meq/L (ref 3.7–5.3)
SODIUM: 142 meq/L (ref 137–147)

## 2013-07-30 MED ORDER — MENTHOL 3 MG MT LOZG: 1.0000 | LOZENGE | OROMUCOSAL | Status: DC | PRN

## 2013-07-30 MED ORDER — DOXEPIN HCL 10 MG PO CAPS
10.0000 mg | ORAL_CAPSULE | Freq: Every day | ORAL | Status: DC
  Administered 2013-07-30 – 2013-08-05 (×7): 10 mg via ORAL
  Filled 2013-07-30 (×8): qty 1

## 2013-07-30 MED ORDER — RIVAROXABAN 10 MG PO TABS
10.0000 mg | ORAL_TABLET | Freq: Every day | ORAL | Status: DC
  Administered 2013-07-31 – 2013-08-06 (×7): 10 mg via ORAL
  Filled 2013-07-30 (×8): qty 1

## 2013-07-30 MED ORDER — POLYSACCHARIDE IRON COMPLEX 150 MG PO CAPS
150.0000 mg | ORAL_CAPSULE | Freq: Every day | ORAL | Status: DC
  Administered 2013-07-31 – 2013-08-06 (×7): 150 mg via ORAL
  Filled 2013-07-30 (×8): qty 1

## 2013-07-30 MED ORDER — PROCHLORPERAZINE 25 MG RE SUPP
12.5000 mg | Freq: Four times a day (QID) | RECTAL | Status: DC | PRN
  Filled 2013-07-30: qty 1

## 2013-07-30 MED ORDER — SIMETHICONE 80 MG PO CHEW
80.0000 mg | CHEWABLE_TABLET | Freq: Four times a day (QID) | ORAL | Status: DC | PRN
  Filled 2013-07-30: qty 1

## 2013-07-30 MED ORDER — GUAIFENESIN-DM 100-10 MG/5ML PO SYRP: 5.0000 mL | ORAL_SOLUTION | Freq: Four times a day (QID) | ORAL | Status: DC | PRN

## 2013-07-30 MED ORDER — POLYSACCHARIDE IRON COMPLEX 150 MG PO CAPS: 150.0000 mg | ORAL_CAPSULE | Freq: Two times a day (BID) | ORAL | Status: DC

## 2013-07-30 MED ORDER — METHOCARBAMOL 500 MG PO TABS
500.0000 mg | ORAL_TABLET | Freq: Four times a day (QID) | ORAL | Status: DC | PRN
Start: 2013-07-30 — End: 2013-08-06
  Administered 2013-07-31 – 2013-08-06 (×7): 500 mg via ORAL
  Filled 2013-07-30 (×7): qty 1

## 2013-07-30 MED ORDER — ALBUTEROL SULFATE (2.5 MG/3ML) 0.083% IN NEBU: 2.5000 mg | INHALATION_SOLUTION | Freq: Four times a day (QID) | RESPIRATORY_TRACT | Status: DC | PRN

## 2013-07-30 MED ORDER — CITALOPRAM HYDROBROMIDE 10 MG PO TABS
10.0000 mg | ORAL_TABLET | Freq: Every morning | ORAL | Status: DC
  Administered 2013-07-31 – 2013-08-06 (×7): 10 mg via ORAL
  Filled 2013-07-30 (×7): qty 1

## 2013-07-30 MED ORDER — OXYCODONE HCL 5 MG PO TABS: 5.0000 mg | ORAL_TABLET | ORAL | Status: DC | PRN

## 2013-07-30 MED ORDER — DSS 100 MG PO CAPS: 100.0000 mg | ORAL_CAPSULE | Freq: Two times a day (BID) | ORAL | Status: DC

## 2013-07-30 MED ORDER — POLYETHYLENE GLYCOL 3350 17 G PO PACK
17.0000 g | PACK | Freq: Two times a day (BID) | ORAL | Status: DC
  Administered 2013-07-30 – 2013-08-05 (×11): 17 g via ORAL
  Filled 2013-07-30 (×16): qty 1

## 2013-07-30 MED ORDER — PHENOL 1.4 % MT LIQD: 1.0000 | OROMUCOSAL | Status: DC | PRN

## 2013-07-30 MED ORDER — BISACODYL 10 MG RE SUPP: 10.0000 mg | Freq: Every day | RECTAL | Status: DC | PRN

## 2013-07-30 MED ORDER — POLYETHYLENE GLYCOL 3350 17 G PO PACK: 17.0000 g | PACK | Freq: Every day | ORAL | Status: DC | PRN

## 2013-07-30 MED ORDER — PROCHLORPERAZINE EDISYLATE 5 MG/ML IJ SOLN
5.0000 mg | Freq: Four times a day (QID) | INTRAMUSCULAR | Status: DC | PRN
  Filled 2013-07-30: qty 2

## 2013-07-30 MED ORDER — RIVAROXABAN 10 MG PO TABS: 10.0000 mg | ORAL_TABLET | Freq: Every day | ORAL | Status: DC

## 2013-07-30 MED ORDER — PROCHLORPERAZINE MALEATE 5 MG PO TABS
5.0000 mg | ORAL_TABLET | Freq: Four times a day (QID) | ORAL | Status: DC | PRN
  Filled 2013-07-30: qty 2

## 2013-07-30 MED ORDER — ONDANSETRON HCL 4 MG PO TABS: 4.0000 mg | ORAL_TABLET | Freq: Four times a day (QID) | ORAL | Status: DC | PRN

## 2013-07-30 MED ORDER — TRAMADOL HCL 50 MG PO TABS
50.0000 mg | ORAL_TABLET | Freq: Four times a day (QID) | ORAL | Status: DC | PRN
  Administered 2013-07-31 – 2013-08-06 (×7): 50 mg via ORAL
  Filled 2013-07-30 (×7): qty 1

## 2013-07-30 MED ORDER — BUPROPION HCL ER (SR) 150 MG PO TB12
150.0000 mg | ORAL_TABLET | Freq: Two times a day (BID) | ORAL | Status: DC
  Administered 2013-07-30 – 2013-08-06 (×14): 150 mg via ORAL
  Filled 2013-07-30 (×16): qty 1

## 2013-07-30 MED ORDER — IRBESARTAN 150 MG PO TABS
150.0000 mg | ORAL_TABLET | Freq: Two times a day (BID) | ORAL | Status: DC
  Administered 2013-07-30 – 2013-08-06 (×13): 150 mg via ORAL
  Filled 2013-07-30 (×16): qty 1

## 2013-07-30 MED ORDER — ACETAMINOPHEN 325 MG PO TABS: 650.0000 mg | ORAL_TABLET | Freq: Four times a day (QID) | ORAL | Status: DC | PRN

## 2013-07-30 MED ORDER — BUSPIRONE HCL 10 MG PO TABS
10.0000 mg | ORAL_TABLET | Freq: Two times a day (BID) | ORAL | Status: DC
  Administered 2013-07-30 – 2013-08-06 (×14): 10 mg via ORAL
  Filled 2013-07-30 (×16): qty 1

## 2013-07-30 MED ORDER — METOCLOPRAMIDE HCL 5 MG PO TABS: 5.0000 mg | ORAL_TABLET | Freq: Three times a day (TID) | ORAL | Status: DC | PRN

## 2013-07-30 MED ORDER — FLEET ENEMA 7-19 GM/118ML RE ENEM: 1.0000 | ENEMA | Freq: Once | RECTAL | Status: AC | PRN

## 2013-07-30 MED ORDER — METHOCARBAMOL 500 MG PO TABS: 500.0000 mg | ORAL_TABLET | Freq: Four times a day (QID) | ORAL | Status: DC | PRN

## 2013-07-30 MED ORDER — ACETAMINOPHEN 325 MG PO TABS
325.0000 mg | ORAL_TABLET | ORAL | Status: DC | PRN
  Administered 2013-07-30 – 2013-08-04 (×11): 650 mg via ORAL
  Filled 2013-07-30 (×12): qty 2

## 2013-07-30 MED ORDER — DIPHENHYDRAMINE HCL 12.5 MG/5ML PO ELIX: 12.5000 mg | ORAL_SOLUTION | ORAL | Status: DC | PRN

## 2013-07-30 MED ORDER — LORATADINE 10 MG PO TABS
10.0000 mg | ORAL_TABLET | Freq: Every day | ORAL | Status: DC
  Administered 2013-07-31 – 2013-08-06 (×7): 10 mg via ORAL
  Filled 2013-07-30 (×8): qty 1

## 2013-07-30 MED ORDER — ESOMEPRAZOLE MAGNESIUM 40 MG PO CPDR
40.0000 mg | DELAYED_RELEASE_CAPSULE | Freq: Two times a day (BID) | ORAL | Status: DC
  Administered 2013-07-31 – 2013-08-06 (×13): 40 mg via ORAL
  Filled 2013-07-30 (×17): qty 1

## 2013-07-30 NOTE — Progress Notes (Signed)
Subjective: 2 Days Post-Op Procedure(s) (LRB): LEFT TOTAL HIP ARTHROPLASTY ANTERIOR APPROACH (Left) Patient reports pain as mild.   Patient seen in rounds with Dr. Wynelle Link. Patient is well, but has had some minor complaints of pain in the hip, requiring pain medications Patient is ready to go to CIR if insurance is approved.  Objective: Vital signs in last 24 hours: Temp:  [98.5 F (36.9 C)-98.8 F (37.1 C)] 98.7 F (37.1 C) (07/17 0601) Pulse Rate:  [70-86] 70 (07/17 0601) Resp:  [17-18] 18 (07/17 0601) BP: (104-123)/(45-51) 104/51 mmHg (07/17 0601) SpO2:  [96 %-98 %] 98 % (07/17 0601)  Intake/Output from previous day:  Intake/Output Summary (Last 24 hours) at 07/30/13 0854 Last data filed at 07/30/13 0810  Gross per 24 hour  Intake    240 ml  Output   2200 ml  Net  -1960 ml    Intake/Output this shift: Total I/O In: 240 [P.O.:240] Out: -   Labs:  Recent Labs  07/29/13 0436 07/30/13 0502  HGB 8.9* 8.2*    Recent Labs  07/29/13 0436 07/30/13 0502  WBC 16.4* 20.5*  RBC 2.83* 2.58*  HCT 27.0* 24.6*  PLT 264 262    Recent Labs  07/29/13 0436 07/30/13 0502  NA 140 142  K 4.5 3.9  CL 106 106  CO2 25 25  BUN 11 15  CREATININE 0.87 0.84  GLUCOSE 156* 135*  CALCIUM 8.6 9.1   No results found for this basename: LABPT, INR,  in the last 72 hours  EXAM: General - Patient is Alert, Appropriate and Oriented Extremity - Neurovascular intact Sensation intact distally Incision - clean, dry, no drainage, healing Motor Function - intact, moving foot and toes well on exam.   Assessment/Plan: 2 Days Post-Op Procedure(s) (LRB): LEFT TOTAL HIP ARTHROPLASTY ANTERIOR APPROACH (Left) Procedure(s) (LRB): LEFT TOTAL HIP ARTHROPLASTY ANTERIOR APPROACH (Left) Past Medical History  Diagnosis Date  . Complication of anesthesia     NAUSEA  . Chest pain     pt states due to esophageal problem -  primary care MD aware   . Hives     since pneumonia vaccine   .  Asthma   . Depression   . Anxiety   . Fibromyalgia   . Spondylosis   . Bulging disc   . 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 Phantom limb pain     rt leg  . Leg pain, left   . Eczema     ears  . GERD (gastroesophageal reflux disease)     " alot of burping"  . Constipation, chronic   . Urgency incontinence   . History of transfusion   . Cancer     hx sarcoma rt knee with amputation  . Difficulty sleeping   . Sleep apnea     pt had recent sleep study done but does not know results - report called for and put on chart showing significant obstructive sleep apnea   Principal Problem:   OA (osteoarthritis) of hip Active Problems:   Fibromyalgia   GERD (gastroesophageal reflux disease)   Hiatal hernia   Postoperative anemia due to acute blood loss   Unspecified asthma(493.90)   Depression   Anxiety state, unspecified   History of Sarcoma, Bone   Phantom limb (syndrome)   Eczema   History of transfusion   Obstructive sleep apnea  Estimated body mass index is 28.16 kg/(m^2) as calculated from the following:  Height as of this encounter: 5\' 2"  (1.575 m).   Weight as of this encounter: 69.854 kg (154 lb). Up with therapy Discharge to CIR Diet - Cardiac diet Follow up - in 2 weeks Activity - WBAT Disposition - Rehab Condition Upon Discharge - Good at time of rounds D/C Meds - See DC Summary DVT Prophylaxis - [place], PA-C Orthopaedic Surgery 07/30/2013, 8:54 AM

## 2013-07-30 NOTE — H&P (View-Only) (Signed)
Physical Medicine and Rehabilitation Admission H&P    CC: Left THR in patient with R-AKA  HPI: [name] is a 63 y.o. 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 She has failed conservative measures and elected to undergo L-THR by Dr. Wynelle Link on 07/28/13. Post op with leucocytosis as well as ABLA. She is WBAT and on Xarelto for DVT prophylaxis. PT evaluation done yesterday showing patient requiring significant amount of assistance. CIR recommended by threapy and MD.    ROS Denies fever cough, congestion, sob, chest pain, diarrhea. Occasional anxiety. Sleep is fair. Left hip is tender.   Past Medical History  Diagnosis Date  . Complication of anesthesia     NAUSEA  . Chest pain     pt states due to esophageal problem -  primary care MD aware   . Hives     since pneumonia vaccine   . Asthma   . Depression   . Anxiety   . Fibromyalgia   . Spondylosis   . Bulging disc   . 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 Phantom limb pain     rt leg  . Leg pain, left   . Eczema     ears  . GERD (gastroesophageal reflux disease)     " alot of burping"  . Constipation, chronic   . Urgency incontinence   . History of transfusion   . Cancer     hx sarcoma rt knee with amputation  . Difficulty sleeping   . Sleep apnea     pt had recent sleep study done but does not know results - report called for and put on chart showing significant obstructive sleep apnea    Past Surgical History  Procedure Laterality Date  . Abdominal hysterectomy  1980'S  . Sarcoma  1973    RT KNEE (aka)  . External ear surgery  AGE 86  . Total hip arthroplasty Left 07/28/2013    Procedure: LEFT TOTAL HIP ARTHROPLASTY ANTERIOR APPROACH;  Surgeon: [name], MD;  Location: [facility];  Service: Orthopedics;  Laterality: Left;    Family History  Problem Relation Age of Onset  . CAD Sister     CABG, stents  . CAD Father     Social History: Married. Disabled due to AKA. Was independent with crutches PTA. She reports that she quit smoking about 6 years ago. She does not have any smokeless tobacco history on file. She reports that she does not drink alcohol or use illicit drugs.    Allergies  Allergen Reactions  . Bactrim [Sulfamethoxazole-Tmp Ds] Shortness Of Breath  . Cefuroxime Shortness Of Breath  . Pneumococcal Vaccines Hives    Hives on arm after shot  . Omnipaque [Iohexol]     Patient stated that she had a reaction a few years ago to the IV dye and had trouble breathing--so for her CT Angio Chest today (07/12/13), ER MD gave her solumedrol and Benadryl before she had test and did very well with that.  ak 07/12/13  . Penicillins Diarrhea  . Sulfa Antibiotics Other (See Comments)    unknown    Medications Prior to Admission  Medication Sig Dispense Refill  .  buPROPion (WELLBUTRIN SR) 150 MG 12 hr tablet Take 150 mg by mouth 2 (two) times daily.      . busPIRone (BUSPAR) 10 MG tablet Take 10 mg by mouth 2 (two) times daily.      . citalopram (CELEXA) 10 MG tablet Take 10 mg by mouth every morning.      . cyclobenzaprine (FLEXERIL) 10 MG tablet Take 10 mg by mouth 2 (two) times daily.       Marland Kitchen doxepin (SINEQUAN) 10 MG capsule Take 10 mg by mouth at bedtime.      Marland Kitchen esomeprazole (NEXIUM) 20 MG capsule Take 1 capsule (20 mg total) by mouth 2 (two) times daily before a meal.  60 capsule  0  . fexofenadine (ALLEGRA) 180 MG tablet Take 180 mg by mouth daily.      . irbesartan (AVAPRO) 150 MG tablet Take 150 mg by mouth 2 (two) times daily.      Marland Kitchen albuterol (PROVENTIL HFA;VENTOLIN HFA) 108 (90 BASE) MCG/ACT inhaler Inhale 2 puffs into the lungs every 6 (six) hours as needed for wheezing or shortness of breath.      . Cholecalciferol  (VITAMIN D) 2000 UNITS tablet Take 2,000 Units by mouth daily.      . diphenhydrAMINE (BENADRYL) 25 MG tablet Take 1 tablet (25 mg total) by mouth every 6 (six) hours as needed for itching.  30 tablet  0  . fluocinolone (SYNALAR) 0.01 % external solution Place 2 drops into both ears 2 (two) times daily as needed (itching).      . predniSONE (DELTASONE) 50 MG tablet Take 1 tablet (50 mg total) by mouth daily with breakfast.  5 tablet  0    Home: Home Living Family/patient expects to be discharged to:: Inpatient rehab Living Arrangements: Spouse/significant other Additional Comments: has tub, high commodes--one higher than comfort height; doesn't have 24/7   Functional History: Prior Function Level of Independence: Independent with assistive device(s) Comments: used crutches but has been very sedentary x ~2 yrs 2* hip pain  Functional Status:  Mobility: Bed Mobility Overal bed mobility: Needs Assistance Bed Mobility: Supine to Sit;Sit to Supine Supine to sit: Min guard Sit to supine: Min guard General bed mobility comments: Pt used rails and HOB was 30 degrees; pt stiff this am Transfers Overall transfer level: Needs assistance Equipment used: Rolling walker (2 wheeled) Transfers: Sit to/from Stand Sit to Stand: Min assist;From elevated surface General transfer comment: cues for hand placement Ambulation/Gait Ambulation/Gait assistance: +2 physical assistance;Min assist;Mod assist Ambulation Distance (Feet): 24 Feet (twice) Assistive device: Rolling walker (2 wheeled) Gait Pattern/deviations: Step-to pattern;Decreased step length - right;Decreased step length - left;Shuffle Gait velocity: decr General Gait Details: cues for sequence, posture and position from RW: physical assist for balance and support     ADL: ADL Overall ADL's : Needs assistance/impaired Grooming: Set up;Sitting Upper Body Bathing: Set up;Sitting Lower Body Bathing: Moderate assistance;+2 for  safety/equipment;Sit to/from stand Upper Body Dressing : Set up;Sitting Lower Body Dressing: Minimal assistance;With adaptive equipment;Sit to/from stand Toilet Transfer: Minimal assistance;Cueing for safety;Stand-pivot;BSC Toileting- Clothing Manipulation and Hygiene: Minimal assistance;Sitting/lateral lean;Sit to/from stand General ADL Comments: pt practiced with reacher and sock aide at eob: when using sock aide, lost balance posteriorly requiring assistance to prevent fall back onto bed.  Pt used hospital commode, which is wider than standard and was able to weight shift/lateral shift to pull panties up prior to standing.  Educated husband and pt on tub bench and cutting cheap liner to fit  between panels.    Cognition: Cognition Overall Cognitive Status: Within Functional Limits for tasks assessed Orientation Level: Oriented X4 Cognition Arousal/Alertness: Awake/alert Behavior During Therapy: WFL for tasks assessed/performed Overall Cognitive Status: Within Functional Limits for tasks assessed  Physical Exam: Blood pressure 104/51, pulse 70, temperature 98.7 F (37.1 C), temperature source Axillary, resp. rate 18, height '5\' 2"'  (1.575 m), weight 69.854 kg (154 lb), SpO2 98.00%. Physical Exam  Nursing note and vitals reviewed.  Constitutional: She is oriented to person, place, and time. She appears well-developed and well-nourished.  HENT: oral mucosa pink and moist, dentition good Head: Normocephalic and atraumatic.  Eyes: Conjunctivae are normal. Pupils are equal, round, and reactive to light.  Neck: Normal range of motion. Neck supple.  Cardiovascular: Normal rate and regular rhythm. No murmurs Respiratory: Effort normal and breath sounds normal. No respiratory distress. She has no wheezes.  GI: Soft. Bowel sounds are normal. She exhibits no distension. There is no tenderness.  Musc: mild to moderate edema about the left thigh. Right AK stump with soft tissue/fat. Very  short Neurological: She is alert and oriented to person, place, and time. No cranial nerve deficit.  Motor 5/5 in BUE. 2 Left hip flex, Knee ext, 4/5 Left ankle DF/PF. Right HF 4+/5.   Skin: Skin is warm and dry. Anterior hip incision is clean and dry with steristrips. Appropriately tender Psychiatric: Her speech is normal and behavior is normal. Thought content normal. Her mood appears normal. Cognition and memory are normal.      Results for orders placed during the hospital encounter of 07/28/13 (from the past 48 hour(s))  CBC     Status: Abnormal   Collection Time    07/29/13  4:36 AM      Result Value Ref Range   WBC 16.4 (*) 4.0 - 10.5 K/uL   RBC 2.83 (*) 3.87 - 5.11 MIL/uL   Hemoglobin 8.9 (*) 12.0 - 15.0 g/dL   HCT 27.0 (*) 36.0 - 46.0 %   MCV 95.4  78.0 - 100.0 fL   MCH 31.4  26.0 - 34.0 pg   MCHC 33.0  30.0 - 36.0 g/dL   RDW 12.7  11.5 - 15.5 %   Platelets 264  150 - 400 K/uL  BASIC METABOLIC PANEL     Status: Abnormal   Collection Time    07/29/13  4:36 AM      Result Value Ref Range   Sodium 140  137 - 147 mEq/L   Potassium 4.5  3.7 - 5.3 mEq/L   Chloride 106  96 - 112 mEq/L   CO2 25  19 - 32 mEq/L   Glucose, Bld 156 (*) 70 - 99 mg/dL   BUN 11  6 - 23 mg/dL   Creatinine, Ser 0.87  0.50 - 1.10 mg/dL   Calcium 8.6  8.4 - 10.5 mg/dL   GFR calc non Af Amer 70 (*) >90 mL/min   GFR calc Af Amer 81 (*) >90 mL/min   Comment: (NOTE)     The eGFR has been calculated using the CKD EPI equation.     This calculation has not been validated in all clinical situations.     eGFR's persistently <90 mL/min signify possible Chronic Kidney     Disease.   Anion gap 9  5 - 15  CBC     Status: Abnormal   Collection Time    07/30/13  5:02 AM      Result Value Ref Range   WBC  20.5 (*) 4.0 - 10.5 K/uL   RBC 2.58 (*) 3.87 - 5.11 MIL/uL   Hemoglobin 8.2 (*) 12.0 - 15.0 g/dL   HCT 24.6 (*) 36.0 - 46.0 %   MCV 95.3  78.0 - 100.0 fL   MCH 31.8  26.0 - 34.0 pg   MCHC 33.3  30.0 - 36.0  g/dL   RDW 13.1  11.5 - 15.5 %   Platelets 262  150 - 400 K/uL  BASIC METABOLIC PANEL     Status: Abnormal   Collection Time    07/30/13  5:02 AM      Result Value Ref Range   Sodium 142  137 - 147 mEq/L   Potassium 3.9  3.7 - 5.3 mEq/L   Chloride 106  96 - 112 mEq/L   CO2 25  19 - 32 mEq/L   Glucose, Bld 135 (*) 70 - 99 mg/dL   BUN 15  6 - 23 mg/dL   Creatinine, Ser 0.84  0.50 - 1.10 mg/dL   Calcium 9.1  8.4 - 10.5 mg/dL   GFR calc non Af Amer 73 (*) >90 mL/min   GFR calc Af Amer 85 (*) >90 mL/min   Comment: (NOTE)     The eGFR has been calculated using the CKD EPI equation.     This calculation has not been validated in all clinical situations.     eGFR's persistently <90 mL/min signify possible Chronic Kidney     Disease.   Anion gap 11  5 - 15   Dg Pelvis Portable  07/28/2013   CLINICAL DATA:  Osteoarthritis of the left hip.  EXAM: PORTABLE PELVIS 1-2 VIEWS  COMPARISON:  Radiograph dated 07/12/2013  FINDINGS: The patient has undergone left total hip prosthesis insertion. The components appear in excellent position in the AP projection. No fracture. Soft tissue drain in place.  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 Osteopenia of the right femur remnant.  IMPRESSION: Satisfactory appearance of the left hip in the AP projection after total hip prosthesis insertion.   Electronically Signed   By: Rozetta Nunnery M.D.   On: 07/28/2013 13:36       Medical Problem List and Plan: 1. Functional deficits secondary to endstage OA of left hip in a patient with remote right AKA (doesn't wear prosthesis) 2.  DVT Prophylaxis/Anticoagulation: Pharmaceutical: Xarelto 3. Pain Management: tylenol, robaxin, ice , and heat. She is unable to tolerate narcotics  -self reported hx of fibromyalgia. Discussed appropriate technique, posture. 4. Mood: patient is motivated and in good spirits  -maintain buspar and celexa, hs doxepin at current doses.  5. Neuropsych: This  patient is capable of making decisions on her own behalf. 6. ABLA: iron supplementation 7. Leukocytosis: Low grade temp today. Wound is clean and lungs appear  -check ua and culture on admit  -consider cxr if symptoms dictate      Post Admission Physician Evaluation: 1. Functional deficits secondary  to OA of left hip s/p left THA, (hx of right AKA--doesn't use prosthesis). 2. Patient is admitted to receive collaborative, interdisciplinary care between the physiatrist, rehab nursing staff, and therapy team. 3. Patient's level of medical complexity and substantial therapy needs in context of that medical necessity cannot be provided at a lesser intensity of care such as a SNF. 4. Patient has experienced substantial functional loss from his/her baseline which was documented above under the "Functional History" and "Functional Status" headings.  Judging by the patient's diagnosis, physical  exam, and functional history, the patient has potential for functional progress which will result in measurable gains while on inpatient rehab.  These gains will be of substantial and practical use upon discharge  in facilitating mobility and self-care at the household level. 5. Physiatrist will provide 24 hour management of medical needs as well as oversight of the therapy plan/treatment and provide guidance as appropriate regarding the interaction of the two. 6. 24 hour rehab nursing will assist with bladder management, bowel management, safety, skin/wound care, disease management, medication administration, pain management and patient education  and help integrate therapy concepts, techniques,education, etc. 7. PT will assess and treat for/with: Lower extremity strength, range of motion, stamina, balance, functional mobility, safety, adaptive techniques and equipment, pain mgt, ortho precautions.   Goals are: mod I. 8. OT will assess and treat for/with: ADL's, functional mobility, safety, upper extremity  strength, adaptive techniques and equipment, pain mgt, ortho precautions, leisure awareness.   Goals are: mod I. 9. SLP will assess and treat for/with: n/a.  Goals are: n/a. 10. Case Management and Social Worker will assess and treat for psychological issues and discharge planning. 11. Team conference will be held weekly to assess progress toward goals and to determine barriers to discharge. 12. Patient will receive at least 3 hours of therapy per day at least 5 days per week. 13. ELOS: 5-7 days       14. Prognosis:  excellent     Meredith Staggers, MD, [facility]   07/30/2013

## 2013-07-30 NOTE — Progress Notes (Signed)
Occupational Therapy Treatment Patient Details Name: [name] MRN: [id number] DOB: 1950-12-11 Today's Date: 07/30/2013    History of present illness L THR 05/28/13; R AKA - s/p ~40 YRS   OT comments  Pt is making progress.  Overall min A for sit to stand ADLs and toilet transfer.  Needs cues for safety, especially with hand placement.  Also, needed min A for sitting balance when using AE.  Would be a great candidate for CIR.  Very motivated.  Follow Up Recommendations  CIR    Equipment Recommendations   (husband will look into tub transfer bench)    Recommendations for Other Services      Precautions / Restrictions Precautions Precautions: Fall Precaution Comments: R AKA - no prosthesis Restrictions Other Position/Activity Restrictions: WBAT       Mobility Bed Mobility         Supine to sit: Min guard     General bed mobility comments: Pt used rails and HOB was 30 degrees; pt stiff this am  Transfers   Equipment used: Rolling walker (2 wheeled) Transfers: Sit to/from Stand Sit to Stand: Min assist;From elevated surface         General transfer comment: cues for hand placement    Balance                                   ADL Overall ADL's : Needs assistance/impaired                     Lower Body Dressing: Minimal assistance;With adaptive equipment;Sit to/from stand   Toilet Transfer: Minimal assistance;Cueing for safety;Stand-pivot;BSC   Toileting- Clothing Manipulation and Hygiene: Minimal assistance;Sitting/lateral lean;Sit to/from stand         General ADL Comments: pt practiced with reacher and sock aide at eob: when using sock aide, lost balance posteriorly requiring assistance to prevent fall back onto bed.  Pt used hospital commode, which is wider than standard and was able to weight shift/lateral shift to pull panties up prior to standing.  Educated husband and pt on tub bench and cutting cheap liner to fit between  panels.        Vision                     Perception     Praxis      Cognition   Behavior During Therapy: WFL for tasks assessed/performed Overall Cognitive Status: Within Functional Limits for tasks assessed                       Extremity/Trunk Assessment               Exercises     Shoulder Instructions       General Comments      Pertinent Vitals/ Pain       C/o stiffness in L hip.  repositioned  Home Living                                          Prior Functioning/Environment              Frequency Min 2X/week     Progress Toward Goals  OT Goals(current goals can now be found in the care plan section)  Progress towards OT goals:  Progressing toward goals     Plan      Co-evaluation                 End of Session     Activity Tolerance Patient tolerated treatment well   Patient Left in bed;with call bell/phone within reach;with family/visitor present   Nurse Communication          Time: 5498-2641 OT Time Calculation (min): 30 min  Charges: OT General Charges $OT Visit: 1 Procedure OT Treatments $Self Care/Home Management : 23-37 mins  Rebecca Motta 07/30/2013, 8:45 AM   Lesle Chris, OTR/L [phone] 07/30/2013

## 2013-07-30 NOTE — Progress Notes (Signed)
Report called to luz RN at [facility]  D Shukri Nistler RN

## 2013-07-30 NOTE — Progress Notes (Signed)
Rehab admissions - I have approval from [facility] for acute inpatient rehab admission for today.  Bed available and will admit to inpatient rehab today.  Call me for questions.  #[phone]

## 2013-07-30 NOTE — Progress Notes (Signed)
Physical Medicine and Rehabilitation Consult  Reason for Consult: R-THR in patient with L-AKA  Referring Physician: Dr. Wynelle Link  HPI: [name] is a 63 y.o. 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 She has failed conservative measures and elected to undergo L-THR by Dr. Wynelle Link on 07/28/13. Post op with leucocytosis as well as ABLA. She is WBAT and on Xarelto for DVT prophylaxis. PT evaluation done yesterday showing patient requiring significant amount of assistance. CIR recommended by threapy and MD.  Patient reports that she has had problems with transfers and mobility for past 2 years. She sits in her recliner chair most of the day. Taking a shower wears her out for the day. Phantom pain RLE as well as sciatica are also a limiting factor limiting mobility at times. Her husband takes care of managing the home and plans on taking two weeks off to assist past discharge.  Off IV pain meds secondary to hallucinations  Review of Systems  HENT: Negative for hearing loss.  Eyes: Negative for blurred vision and double vision.  Respiratory: Positive for shortness of breath (with minimal activity). Negative for cough.  Cardiovascular: Positive for chest pain (question due to esophageal spasms. ). Negative for leg swelling.  Gastrointestinal: Positive for heartburn and constipation. Negative for nausea.  Genitourinary: Positive for urgency and frequency.  Musculoskeletal: Positive for back pain, falls, joint pain and myalgias.  Neurological: Positive for sensory change and weakness. Negative for headaches.  Psychiatric/Behavioral: The patient is nervous/anxious.   Past Medical History   Diagnosis  Date   .  Complication of anesthesia      NAUSEA   .   Chest pain      pt states due to esophageal problem - primary care MD aware   .  Hives      since pneumonia vaccine   .  Asthma    .  Depression    .  Anxiety    .  Fibromyalgia    .  Spondylosis    .  Bulging disc    .  Hx of sarcoma of bone      rt knee with aka   .  Phantom limb pain      rt leg   .  Leg pain, left    .  Eczema      ears   .  GERD (gastroesophageal reflux disease)      " alot of burping"   .  Constipation, chronic    .  Urgency incontinence    .  History of transfusion    .  Cancer      hx sarcoma rt knee with amputation   .  Difficulty sleeping    .  Sleep apnea      pt had recent sleep study done but does not know results - report called for and put on chart showing significant obstructive sleep apnea    Past Surgical History   Procedure  Laterality  Date   .  Abdominal hysterectomy   1980'S   .  Sarcoma   1973     RT KNEE (aka)   .  External ear surgery   AGE 15   .  Total hip arthroplasty  Left  07/28/2013  Procedure: LEFT TOTAL HIP ARTHROPLASTY ANTERIOR APPROACH; Surgeon: [name], MD; Location: [facility]; Service: Orthopedics; Laterality: Left;    Family History   Problem  Relation  Age of Onset   .  CAD  Sister      CABG, stents   .  CAD  Father     Social History: Married. Disabled due to AKA. Was independent with crutches PTA. She reports that she quit smoking about 6 years ago. She does not have any smokeless tobacco history on file. She reports that she does not drink alcohol or use illicit drugs.  Allergies   Allergen  Reactions   .  Bactrim [Sulfamethoxazole-Tmp Ds]  Shortness Of Breath   .  Cefuroxime  Shortness Of Breath   .  Pneumococcal Vaccines  Hives     Hives on arm after shot   .  Omnipaque [Iohexol]      Patient stated that she had a reaction a few years ago to the IV dye and had trouble breathing--so for her CT Angio Chest today (07/12/13), ER MD gave her solumedrol and Benadryl before she had test and did very well  with that. ak 07/12/13   .  Penicillins  Diarrhea   .  Sulfa Antibiotics  Other (See Comments)     unknown    Medications Prior to Admission   Medication  Sig  Dispense  Refill   .  buPROPion (WELLBUTRIN SR) 150 MG 12 hr tablet  Take 150 mg by mouth 2 (two) times daily.     .  busPIRone (BUSPAR) 10 MG tablet  Take 10 mg by mouth 2 (two) times daily.     .  citalopram (CELEXA) 10 MG tablet  Take 10 mg by mouth every morning.     .  cyclobenzaprine (FLEXERIL) 10 MG tablet  Take 10 mg by mouth 2 (two) times daily.     Marland Kitchen  doxepin (SINEQUAN) 10 MG capsule  Take 10 mg by mouth at bedtime.     Marland Kitchen  esomeprazole (NEXIUM) 20 MG capsule  Take 1 capsule (20 mg total) by mouth 2 (two) times daily before a meal.  60 capsule  0   .  fexofenadine (ALLEGRA) 180 MG tablet  Take 180 mg by mouth daily.     .  irbesartan (AVAPRO) 150 MG tablet  Take 150 mg by mouth 2 (two) times daily.     Marland Kitchen  albuterol (PROVENTIL HFA;VENTOLIN HFA) 108 (90 BASE) MCG/ACT inhaler  Inhale 2 puffs into the lungs every 6 (six) hours as needed for wheezing or shortness of breath.     .  Cholecalciferol (VITAMIN D) 2000 UNITS tablet  Take 2,000 Units by mouth daily.     .  diphenhydrAMINE (BENADRYL) 25 MG tablet  Take 1 tablet (25 mg total) by mouth every 6 (six) hours as needed for itching.  30 tablet  0   .  fluocinolone (SYNALAR) 0.01 % external solution  Place 2 drops into both ears 2 (two) times daily as needed (itching).     .  predniSONE (DELTASONE) 50 MG tablet  Take 1 tablet (50 mg total) by mouth daily with breakfast.  5 tablet  0    Home:  Home Living  Family/patient expects to be discharged to:: Inpatient rehab  Living Arrangements: Spouse/significant other  Functional History:  Prior Function  Level of Independence: Independent with assistive device(s)  Comments: used crutches but has been very sedentary x ~2 yrs 2* hip pain  Functional Status:  Mobility:  Bed Mobility  Overal bed mobility: Needs Assistance  Bed  Mobility: Supine to Sit;Sit to Supine  Supine to sit: Min assist;Mod assist  Sit to supine: Min assist;Mod assist  General bed mobility comments: cues for sequence and use of UEs to self assist  Transfers  Overall transfer level: Needs assistance  Equipment used: Rolling walker (2 wheeled)  Transfers: Sit to/from Stand  Sit to Stand: +2 physical assistance;Mod assist  General transfer comment: cues for LE management and use of UEs to self assist  Ambulation/Gait  Ambulation/Gait assistance: +2 physical assistance;+2 safety/equipment;Mod assist;Max assist  Ambulation Distance (Feet): 4 Feet  Assistive device: Rolling walker (2 wheeled)  Gait Pattern/deviations: Step-to pattern;Decreased step length - right;Decreased step length - left;Shuffle;Trunk flexed  General Gait Details: cues for sequence, posture and position from RW: physical assist for balance and support - increased with each step   ADL:   Cognition:  Cognition  Overall Cognitive Status: Within Functional Limits for tasks assessed  Orientation Level: Oriented X4  Cognition  Arousal/Alertness: Awake/alert  Behavior During Therapy: WFL for tasks assessed/performed  Overall Cognitive Status: Within Functional Limits for tasks assessed  Blood pressure 105/42, pulse 76, temperature 97.9 F (36.6 C), temperature source Axillary, resp. rate 18, height 5\' 2"  (1.575 m), weight 69.854 kg (154 lb), SpO2 99.00%.  Physical Exam  Nursing note and vitals reviewed.  Constitutional: She is oriented to person, place, and time. She appears well-developed and well-nourished.  HENT:  Head: Normocephalic and atraumatic.  Eyes: Conjunctivae are normal. Pupils are equal, round, and reactive to light.  Neck: Normal range of motion. Neck supple.  Cardiovascular: Normal rate and regular rhythm.  Respiratory: Effort normal and breath sounds normal. No respiratory distress. She has no wheezes.  GI: Soft. Bowel sounds are normal. She exhibits no  distension. There is no tenderness.  Neurological: She is alert and oriented to person, place, and time. No cranial nerve deficit.  Skin: Skin is warm and dry.  Psychiatric: Her speech is normal and behavior is normal. Thought content normal. Her mood appears anxious. Cognition and memory are normal.  Moto 5/5 in BUE  3- Left hip flex, Knee ext, 4/5 Left ankle DF  MSK: FROM in BUE, Right AKA stump at upper femur with abundant distal soft tissue  Results for orders placed during the hospital encounter of 07/28/13 (from the past 24 hour(s))   GLUCOSE, CAPILLARY Status: Abnormal    Collection Time    07/28/13 12:58 PM   Result  Value  Ref Range    Glucose-Capillary  149 (*)  70 - 99 mg/dL    Comment 1  Documented in Chart    CBC Status: Abnormal    Collection Time    07/29/13 4:36 AM   Result  Value  Ref Range    WBC  16.4 (*)  4.0 - 10.5 K/uL    RBC  2.83 (*)  3.87 - 5.11 MIL/uL    Hemoglobin  8.9 (*)  12.0 - 15.0 g/dL    HCT  27.0 (*)  36.0 - 46.0 %    MCV  95.4  78.0 - 100.0 fL    MCH  31.4  26.0 - 34.0 pg    MCHC  33.0  30.0 - 36.0 g/dL    RDW  12.7  11.5 - 15.5 %    Platelets  264  150 - 400 K/uL   BASIC METABOLIC PANEL Status: Abnormal    Collection Time  07/29/13 4:36 AM   Result  Value  Ref Range    Sodium  140  137 - 147 mEq/L    Potassium  4.5  3.7 - 5.3 mEq/L    Chloride  106  96 - 112 mEq/L    CO2  25  19 - 32 mEq/L    Glucose, Bld  156 (*)  70 - 99 mg/dL    BUN  11  6 - 23 mg/dL    Creatinine, Ser  0.87  0.50 - 1.10 mg/dL    Calcium  8.6  8.4 - 10.5 mg/dL    GFR calc non Af Amer  70 (*)  >90 mL/min    GFR calc Af Amer  81 (*)  >90 mL/min    Anion gap  9  5 - 15    Dg Pelvis Portable  07/28/2013 CLINICAL DATA: Osteoarthritis of the left hip. EXAM: PORTABLE PELVIS 1-2 VIEWS COMPARISON: Radiograph dated 07/12/2013 FINDINGS: The patient has undergone left total hip prosthesis insertion. The components appear in excellent position in the AP projection. No fracture.  Soft tissue drain in place. 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 Osteopenia of the right femur remnant. IMPRESSION: Satisfactory appearance of the left hip in the AP projection after total hip prosthesis insertion. Electronically Signed By: Rozetta Nunnery M.D. On: 07/28/2013 13:36  Dg C-arm 61-120 Min-no Report  07/28/2013 CLINICAL DATA: left anterior hip C-ARM 61-120 MINUTES Fluoroscopy was utilized by the requesting physician. No radiographic interpretation.   Assessment/Plan:  Diagnosis: Left THR in a pt with chronic R AKA  1. Does the need for close, 24 hr/day medical supervision in concert with the patient's rehab needs make it unreasonable for this patient to be served in a less intensive setting? Potentially 2. Co-Morbidities requiring supervision/potential complications: FM,Phantom limb pain, post op anemia 3. Due to bladder management, bowel management, safety, skin/wound care, disease management, medication administration, pain management and patient education, does the patient require 24 hr/day rehab nursing? Yes 4. Does the patient require coordinated care of a physician, rehab nurse, PT (1-2 hrs/day, 5 days/week) and OT (1-2 hrs/day, 5 days/week) to address physical and functional deficits in the context of the above medical diagnosis(es)? Yes Addressing deficits in the following areas: balance, endurance, locomotion, strength, transferring, bathing, dressing, feeding, grooming and toileting 5. Can the patient actively participate in an intensive therapy program of at least 3 hrs of therapy per day at least 5 days per week? Yes 6. The potential for patient to make measurable gains while on inpatient rehab is excellent 7. Anticipated functional outcomes upon discharge from inpatient rehab are supervision with PT, supervision with OT, n/a with SLP. 8. Estimated rehab length of stay to reach the above functional goals is: 7d 9. Does the patient have adequate  social supports to accommodate these discharge functional goals? Yes 10. Anticipated D/C setting: Home 11. Anticipated post D/C treatments: [facility] therapy 12. Overall Rehab/Functional Prognosis: excellent RECOMMENDATIONS:  This patient's condition is appropriate for continued rehabilitative care in the following setting: CIR  Patient has agreed to participate in recommended program. Yes  Note that insurance prior authorization may be required for reimbursement for recommended care.  Comment: should be ready POD #2 or 3 if ok with Ortho  07/29/2013  Revision History...      Date/Time User Action    07/29/2013 5:08 PM Charlett Blake, MD Sign    07/29/2013 12:27 PM Bary Leriche, PA-C Share  07/29/2013 9:42 AM Bary Leriche, PA-C Share   View Details Report    Routing History.Marland KitchenMarland Kitchen

## 2013-07-30 NOTE — Progress Notes (Signed)
PMR Admission Coordinator Pre-Admission Assessment  Patient: [name] is an 63 y.o., female  MRN: [id number]  DOB: February 08, 1950  Height: 5' 2" (157.5 cm)  Weight: 69.854 kg (154 lb)  Insurance Information  HMO: PPO: Yes PCP: IPA: 80/20: OTHER: Group # [id number]  PRIMARY: BCBS PPO of [place] Policy#: [id number] Subscriber: Nayelli Inglis  CM Name: Fraser Din Phone#: [phone] Fax#: [phone]  Pre-Cert#: [id number] Employer: Spouse works FT  Benefits: Phone #: [phone] Name: Theador Hawthorne. Date: 03/15/11 Deduct: $750 (met) Out of Pocket Max: $3000 (met $1204.47) Life Max: unlimited  CIR: $500 copay per admit and then 80% SNF: $500 copay per admit and then 80%  Outpatient: 80% with 20 visits per year Co-Pay: 20%  Home Health: 80% with 60 visits per year Co-Pay: 20%  DME: 80% Co-Pay: 20%  Providers: in network  Note: If no response from [facility], can call Manuela Schwartz, [place] at [phone]. Fraser Din will be on vacation next week.  Emergency Contact Information  Contact Information    Name  Relation  Home  Work  [place]  Spouse  [phone]  [phone]  [phone]      Current Medical History  Patient Admitting Diagnosis: R THR, L AKA  History of Present Illness: A 63 y.o. 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 She has failed conservative measures and elected to undergo L-THR by Dr. Wynelle Link on 07/28/13. Post op with leucocytosis as well as ABLA. She is WBAT and on Xarelto for DVT prophylaxis. PT evaluation done yesterday showing patient requiring significant amount of assistance. CIR recommended by threapy and MD.  Patient reports that she has had problems with transfers and mobility for past 2 years. She sits in her recliner chair most  of the day. Taking a shower wears her out for the day. Phantom pain RLE as well as sciatica are also a limiting factor limiting mobility at times. Her husband takes care of managing the home and plans on taking two weeks off to assist past discharge.  Past Medical History  Past Medical History   Diagnosis  Date   .  Complication of anesthesia      NAUSEA   .  Chest pain      pt states due to esophageal problem - primary care MD aware   .  Hives      since pneumonia vaccine   .  Asthma    .  Depression    .  Anxiety    .  Fibromyalgia    .  Spondylosis    .  Bulging disc    .  Hx of sarcoma of bone      rt knee with aka   .  Phantom limb pain      rt leg   .  Leg pain, left    .  Eczema      ears   .  GERD (gastroesophageal reflux disease)      " alot of burping"   .  Constipation, chronic    .  Urgency incontinence    .  History of transfusion    .  Cancer      hx sarcoma rt knee with amputation   .  Difficulty sleeping    .  Sleep apnea      pt had recent sleep study done but does not know results - report called for and put on chart showing significant obstructive sleep apnea    Family History  family history includes CAD in her father and sister.  Prior Rehab/Hospitalizations: Had outpatient PT in [place] 2 1/2 yrs ago for core therapy.  Current Medications  Current facility-administered medications:0.9 % sodium chloride infusion, , Intravenous, Continuous, Arlee Muslim, PA-C; acetaminophen (TYLENOL) suppository 650 mg, 650 mg, Rectal, Q6H PRN, [name], MD; acetaminophen (TYLENOL) tablet 650 mg, 650 mg, Oral, Q6H PRN, [name], MD, 650 mg at 07/30/13 0955; albuterol (PROVENTIL) (2.5 MG/3ML) 0.083% nebulizer solution 2.5 mg, 2.5 mg, Nebulization, Q6H PRN, [name], MD  bisacodyl (DULCOLAX) suppository 10 mg, 10 mg, Rectal, Daily PRN, [name], MD; buPROPion [facility] SR) 12 hr tablet 150 mg, 150 mg, Oral, BID, [name], MD, 150 mg at  07/30/13 0955; busPIRone (BUSPAR) tablet 10 mg, 10 mg, Oral, BID, [name], MD, 10 mg at 07/30/13 0955; citalopram (CELEXA) tablet 10 mg, 10 mg, Oral, q morning - 10a, [name], MD, 10 mg at 07/30/13 1017  diphenhydrAMINE (BENADRYL) 12.5 MG/5ML elixir 12.5-25 mg, 12.5-25 mg, Oral, Q4H PRN, [name], MD; docusate sodium (COLACE) capsule 100 mg, 100 mg, Oral, BID, [name], MD, 100 mg at 07/30/13 0955; doxepin (SINEQUAN) capsule 10 mg, 10 mg, Oral, QHS, [name], MD, 10 mg at 07/29/13 2206; esomeprazole (NEXIUM) capsule 40 mg, 40 mg, Oral, BID AC, [name], MD, 40 mg at 07/30/13 5102  irbesartan (AVAPRO) tablet 150 mg, 150 mg, Oral, BID, Arlee Muslim, PA-C, 150 mg at 07/29/13 2205; iron polysaccharides (NIFEREX) capsule 150 mg, 150 mg, Oral, Daily, Arlee Muslim, PA-C, 150 mg at 07/30/13 5852; loratadine (CLARITIN) tablet 10 mg, 10 mg, Oral, Daily, [name], MD, 10 mg at 07/30/13 0955; menthol-cetylpyridinium (CEPACOL) lozenge 3 mg, 1 lozenge, Oral, PRN, [name], MD  methocarbamol (ROBAXIN) 500 mg in dextrose 5 % 50 mL IVPB, 500 mg, Intravenous, Q6H PRN, [name], MD, 500 mg at 07/28/13 1533; methocarbamol (ROBAXIN) tablet 500 mg, 500 mg, Oral, Q6H PRN, [name], MD, 500 mg at 07/30/13 0955; metoCLOPramide (REGLAN) injection 5-10 mg, 5-10 mg, Intravenous, Q8H PRN, [name], MD; metoCLOPramide (REGLAN) tablet 5-10 mg, 5-10 mg, Oral, Q8H PRN, [name], MD  morphine 2 MG/ML injection 1-2 mg, 1-2 mg, Intravenous, Q1H PRN, [name], MD; ondansetron (ZOFRAN) injection 4 mg, 4 mg, Intravenous, Q6H PRN, [name], MD; ondansetron (ZOFRAN) tablet 4 mg, 4 mg, Oral, Q6H PRN, Gaynelle Arabian V, MD; oxyCODONE (Oxy IR/ROXICODONE) immediate release tablet 5-10 mg, 5-10 mg, Oral, Q3H PRN, Gaynelle Arabian V, MD, 10 mg at 07/29/13 0557  phenol (CHLORASEPTIC) mouth spray 1 spray, 1 spray, Mouth/Throat, PRN, [name], MD; polyethylene  glycol (MIRALAX / GLYCOLAX) packet 17 g, 17 g, Oral, Daily PRN, [name], MD; rivaroxaban (XARELTO) tablet 10 mg, 10 mg, Oral, Q breakfast, Gaynelle Arabian V, MD, 10 mg at 07/30/13 7782  Patients Current Diet: General  Precautions / Restrictions  Precautions  Precautions: Fall  Precaution Comments: R AKA - no prosthesis  Restrictions  Weight Bearing Restrictions: No  Other Position/Activity Restrictions: WBAT  Prior Activity Level  Household: Homebound lately with outings for MD appointments  Home Assistive Devices / [place] Devices/Equipment: Crutches;Walker (specify type);Bedside commode/3-in-1;Wheelchair;Eyeglasses  Prior Functional  Level  Prior Function  Level of Independence: Independent with assistive device(s)  Comments: used crutches but has been very sedentary x ~2 yrs 2* hip pain  Current Functional Level  Cognition  Overall Cognitive Status: Within Functional Limits for tasks assessed  Orientation Level: Oriented X4   Extremity Assessment  (includes Sensation/Coordination)  Upper Extremity Assessment: Overall WFL for tasks assessed  Lower Extremity Assessment: RLE deficits/detail;LLE deficits/detail  RLE Deficits / Details: AKA  LLE Deficits / Details: 2+/5 hip strength with AAROM at hip to 90 flex and 30 abd  Cervical / Trunk Assessment: Normal   ADLs  Overall ADL's : Needs assistance/impaired  Grooming: Set up;Sitting  Upper Body Bathing: Set up;Sitting  Lower Body Bathing: Moderate assistance;+2 for safety/equipment;Sit to/from stand  Upper Body Dressing : Set up;Sitting  Lower Body Dressing: Minimal assistance;With adaptive equipment;Sit to/from stand  Toilet Transfer: Minimal assistance;Cueing for safety;Stand-pivot;BSC  Toileting- Clothing Manipulation and Hygiene: Minimal assistance;Sitting/lateral lean;Sit to/from stand  General ADL Comments: pt practiced with reacher and sock aide at eob: when using sock aide, lost balance posteriorly  requiring assistance to prevent fall back onto bed. Pt used hospital commode, which is wider than standard and was able to weight shift/lateral shift to pull panties up prior to standing. Educated husband and pt on tub bench and cutting cheap liner to fit between panels.   Mobility  Overal bed mobility: Needs Assistance  Bed Mobility: Supine to Sit;Sit to Supine  Supine to sit: Min guard  Sit to supine: Min guard  General bed mobility comments: Pt used rails and HOB was 30 degrees; pt stiff this am   Transfers  Overall transfer level: Needs assistance  Equipment used: Rolling walker (2 wheeled)  Transfers: Sit to/from Stand  Sit to Stand: Min assist;From elevated surface  General transfer comment: cues for hand placement   Ambulation / Gait / Stairs / Wheelchair Mobility  Ambulation/Gait  Ambulation/Gait assistance: +2 physical assistance;Min assist;Mod assist  Ambulation Distance (Feet): 24 Feet (twice)  Assistive device: Rolling walker (2 wheeled)  Gait Pattern/deviations: Step-to pattern;Decreased step length - right;Decreased step length - left;Shuffle  Gait velocity: decr  General Gait Details: cues for sequence, posture and position from RW: physical assist for balance and support   Posture / Balance    Special needs/care consideration  BiPAP/CPAP Yes, started on CPAP at home 1 week ago. Using CPAP in the hospital.  CPM No  Continuous Drip IV No  Dialysis No  Life Vest No  Oxygen No  Special Bed No  Trach Size No  Wound Vac (area) No  Skin L hip dressing. Has hives from allergic reaction to PNA vaccine - currently on steroids  Bowel mgmt: No BM for the past 4 days. Does have problems with constipation.  Bladder mgmt: Voiding on BSC with some incontinence.  Diabetic mgmt Borderline diabetic per patient.   Previous Home Environment  Living Arrangements: Spouse/significant other  Home Care Services: No  Additional Comments: has tub, high commodes--one higher than comfort height;  doesn't have 24/7  Discharge Living Setting  Plans for Discharge Living Setting: Patient's home;House;Lives with (comment) (Lives with husband.)  Type of Home at Discharge: House  Discharge Home Layout: One level  Discharge Home Access: Stairs to enter  Entrance Stairs-Number of Steps: 1 step carport entrance  Does the patient have any problems obtaining your medications?: No  Social/Family/Support Systems  Patient Roles: Spouse;Parent (Has a husband and 2 daughters.)  Contact Information: Verner Kopischke - husband  Anticipated Caregiver:  husband  Anticipated Caregiver's Contact Information: Sonia Side (h) [phone] (w) [phone] (c) [phone]  Ability/Limitations of Caregiver: Husband can provide supervision at home for 1-2 weeks as needed (Husband works 6;30 am to 5 pm.)  Caregiver Availability: 24/7  Discharge Plan Discussed with Primary Caregiver: Yes  Is Caregiver In Agreement with Plan?: Yes  Does Caregiver/Family have Issues with Lodging/Transportation while Pt is in Rehab?: No  Goals/Additional Needs  Patient/Family Goal for Rehab: PT/OT S goals  Expected length of stay: 7 days  Cultural Considerations: None  Dietary Needs: Regular diet with thin liquids  Equipment Needs: TBD  Special Service Needs: Uses crutches, does not use a prosthesis.  Additional Information: Has 2 daughters. One works as Corporate treasurer and the other dtr is in [place].  Pt/Family Agrees to Admission and willing to participate: Yes  Program Orientation Provided & Reviewed with Pt/Caregiver Including Roles & Responsibilities: Yes  Decrease burden of Care through IP rehab admission: N/A  Possible need for SNF placement upon discharge: Not planned  Patient Condition: This patient's condition remains as documented in the consult dated 07/29/13, in which the Rehabilitation Physician determined and documented that the patient's condition is appropriate for intensive rehabilitative care in an inpatient rehabilitation  facility. Will admit to inpatient rehab today.  Preadmission Screen Completed By: Retta Diones, 07/30/2013 12:26 PM  ______________________________________________________________________  Discussed status with Dr. Naaman Plummer on 07/30/13 at 1251 and received telephone approval for admission today.  Admission Coordinator: Retta Diones, time1251/Date07/17/15  Cosigned by: Meredith Staggers, MD [07/30/2013 1:14 PM]

## 2013-07-30 NOTE — Progress Notes (Signed)
Received pt. From [facility]. From home with husband.Pt. Has a surgical incision at her left hip with gauze and tape on.Pt. Has an old right AKA.Safety plan was sign with pt.Pt. And husband were oriented to unit and procedures.

## 2013-07-30 NOTE — H&P (Signed)
Physical Medicine and Rehabilitation Admission H&P    CC: Left THR in patient with R-AKA  HPI: [name] is a 63 y.o. 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 She has failed conservative measures and elected to undergo L-THR by Dr. Wynelle Link on 07/28/13. Post op with leucocytosis as well as ABLA. She is WBAT and on Xarelto for DVT prophylaxis. PT evaluation done yesterday showing patient requiring significant amount of assistance. CIR recommended by threapy and MD.    ROS Denies fever cough, congestion, sob, chest pain, diarrhea. Occasional anxiety. Sleep is fair. Left hip is tender.   Past Medical History  Diagnosis Date  . Complication of anesthesia     NAUSEA  . Chest pain     pt states due to esophageal problem -  primary care MD aware   . Hives     since pneumonia vaccine   . Asthma   . Depression   . Anxiety   . Fibromyalgia   . Spondylosis   . Bulging disc   . 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 Phantom limb pain     rt leg  . Leg pain, left   . Eczema     ears  . GERD (gastroesophageal reflux disease)     " alot of burping"  . Constipation, chronic   . Urgency incontinence   . History of transfusion   . Cancer     hx sarcoma rt knee with amputation  . Difficulty sleeping   . Sleep apnea     pt had recent sleep study done but does not know results - report called for and put on chart showing significant obstructive sleep apnea    Past Surgical History  Procedure Laterality Date  . Abdominal hysterectomy  1980'S  . Sarcoma  1973    RT KNEE (aka)  . External ear surgery  AGE 45  . Total hip arthroplasty Left 07/28/2013    Procedure: LEFT TOTAL HIP ARTHROPLASTY ANTERIOR APPROACH;  Surgeon: [name], MD;  Location: [facility];  Service: Orthopedics;  Laterality: Left;    Family History  Problem Relation Age of Onset  . CAD Sister     CABG, stents  . CAD Father     Social History: Married. Disabled due to AKA. Was independent with crutches PTA. She reports that she quit smoking about 6 years ago. She does not have any smokeless tobacco history on file. She reports that she does not drink alcohol or use illicit drugs.    Allergies  Allergen Reactions  . Bactrim [Sulfamethoxazole-Tmp Ds] Shortness Of Breath  . Cefuroxime Shortness Of Breath  . Pneumococcal Vaccines Hives    Hives on arm after shot  . Omnipaque [Iohexol]     Patient stated that she had a reaction a few years ago to the IV dye and had trouble breathing--so for her CT Angio Chest today (07/12/13), ER MD gave her solumedrol and Benadryl before she had test and did very well with that.  ak 07/12/13  . Penicillins Diarrhea  . Sulfa Antibiotics Other (See Comments)    unknown    Medications Prior to Admission  Medication Sig Dispense Refill  .  buPROPion (WELLBUTRIN SR) 150 MG 12 hr tablet Take 150 mg by mouth 2 (two) times daily.      . busPIRone (BUSPAR) 10 MG tablet Take 10 mg by mouth 2 (two) times daily.      . citalopram (CELEXA) 10 MG tablet Take 10 mg by mouth every morning.      . cyclobenzaprine (FLEXERIL) 10 MG tablet Take 10 mg by mouth 2 (two) times daily.       Marland Kitchen doxepin (SINEQUAN) 10 MG capsule Take 10 mg by mouth at bedtime.      Marland Kitchen esomeprazole (NEXIUM) 20 MG capsule Take 1 capsule (20 mg total) by mouth 2 (two) times daily before a meal.  60 capsule  0  . fexofenadine (ALLEGRA) 180 MG tablet Take 180 mg by mouth daily.      . irbesartan (AVAPRO) 150 MG tablet Take 150 mg by mouth 2 (two) times daily.      Marland Kitchen albuterol (PROVENTIL HFA;VENTOLIN HFA) 108 (90 BASE) MCG/ACT inhaler Inhale 2 puffs into the lungs every 6 (six) hours as needed for wheezing or shortness of breath.      . Cholecalciferol  (VITAMIN D) 2000 UNITS tablet Take 2,000 Units by mouth daily.      . diphenhydrAMINE (BENADRYL) 25 MG tablet Take 1 tablet (25 mg total) by mouth every 6 (six) hours as needed for itching.  30 tablet  0  . fluocinolone (SYNALAR) 0.01 % external solution Place 2 drops into both ears 2 (two) times daily as needed (itching).      . predniSONE (DELTASONE) 50 MG tablet Take 1 tablet (50 mg total) by mouth daily with breakfast.  5 tablet  0    Home: Home Living Family/patient expects to be discharged to:: Inpatient rehab Living Arrangements: Spouse/significant other Additional Comments: has tub, high commodes--one higher than comfort height; doesn't have 24/7   Functional History: Prior Function Level of Independence: Independent with assistive device(s) Comments: used crutches but has been very sedentary x ~2 yrs 2* hip pain  Functional Status:  Mobility: Bed Mobility Overal bed mobility: Needs Assistance Bed Mobility: Supine to Sit;Sit to Supine Supine to sit: Min guard Sit to supine: Min guard General bed mobility comments: Pt used rails and HOB was 30 degrees; pt stiff this am Transfers Overall transfer level: Needs assistance Equipment used: Rolling walker (2 wheeled) Transfers: Sit to/from Stand Sit to Stand: Min assist;From elevated surface General transfer comment: cues for hand placement Ambulation/Gait Ambulation/Gait assistance: +2 physical assistance;Min assist;Mod assist Ambulation Distance (Feet): 24 Feet (twice) Assistive device: Rolling walker (2 wheeled) Gait Pattern/deviations: Step-to pattern;Decreased step length - right;Decreased step length - left;Shuffle Gait velocity: decr General Gait Details: cues for sequence, posture and position from RW: physical assist for balance and support     ADL: ADL Overall ADL's : Needs assistance/impaired Grooming: Set up;Sitting Upper Body Bathing: Set up;Sitting Lower Body Bathing: Moderate assistance;+2 for  safety/equipment;Sit to/from stand Upper Body Dressing : Set up;Sitting Lower Body Dressing: Minimal assistance;With adaptive equipment;Sit to/from stand Toilet Transfer: Minimal assistance;Cueing for safety;Stand-pivot;BSC Toileting- Clothing Manipulation and Hygiene: Minimal assistance;Sitting/lateral lean;Sit to/from stand General ADL Comments: pt practiced with reacher and sock aide at eob: when using sock aide, lost balance posteriorly requiring assistance to prevent fall back onto bed.  Pt used hospital commode, which is wider than standard and was able to weight shift/lateral shift to pull panties up prior to standing.  Educated husband and pt on tub bench and cutting cheap liner to fit  between panels.    Cognition: Cognition Overall Cognitive Status: Within Functional Limits for tasks assessed Orientation Level: Oriented X4 Cognition Arousal/Alertness: Awake/alert Behavior During Therapy: WFL for tasks assessed/performed Overall Cognitive Status: Within Functional Limits for tasks assessed  Physical Exam: Blood pressure 104/51, pulse 70, temperature 98.7 F (37.1 C), temperature source Axillary, resp. rate 18, height '5\' 2"'  (1.575 m), weight 69.854 kg (154 lb), SpO2 98.00%. Physical Exam  Nursing note and vitals reviewed.  Constitutional: She is oriented to person, place, and time. She appears well-developed and well-nourished.  HENT: oral mucosa pink and moist, dentition good Head: Normocephalic and atraumatic.  Eyes: Conjunctivae are normal. Pupils are equal, round, and reactive to light.  Neck: Normal range of motion. Neck supple.  Cardiovascular: Normal rate and regular rhythm. No murmurs Respiratory: Effort normal and breath sounds normal. No respiratory distress. She has no wheezes.  GI: Soft. Bowel sounds are normal. She exhibits no distension. There is no tenderness.  Musc: mild to moderate edema about the left thigh. Right AK stump with soft tissue/fat. Very  short Neurological: She is alert and oriented to person, place, and time. No cranial nerve deficit.  Motor 5/5 in BUE. 2 Left hip flex, Knee ext, 4/5 Left ankle DF/PF. Right HF 4+/5.   Skin: Skin is warm and dry. Anterior hip incision is clean and dry with steristrips. Appropriately tender Psychiatric: Her speech is normal and behavior is normal. Thought content normal. Her mood appears normal. Cognition and memory are normal.      Results for orders placed during the hospital encounter of 07/28/13 (from the past 48 hour(s))  CBC     Status: Abnormal   Collection Time    07/29/13  4:36 AM      Result Value Ref Range   WBC 16.4 (*) 4.0 - 10.5 K/uL   RBC 2.83 (*) 3.87 - 5.11 MIL/uL   Hemoglobin 8.9 (*) 12.0 - 15.0 g/dL   HCT 27.0 (*) 36.0 - 46.0 %   MCV 95.4  78.0 - 100.0 fL   MCH 31.4  26.0 - 34.0 pg   MCHC 33.0  30.0 - 36.0 g/dL   RDW 12.7  11.5 - 15.5 %   Platelets 264  150 - 400 K/uL  BASIC METABOLIC PANEL     Status: Abnormal   Collection Time    07/29/13  4:36 AM      Result Value Ref Range   Sodium 140  137 - 147 mEq/L   Potassium 4.5  3.7 - 5.3 mEq/L   Chloride 106  96 - 112 mEq/L   CO2 25  19 - 32 mEq/L   Glucose, Bld 156 (*) 70 - 99 mg/dL   BUN 11  6 - 23 mg/dL   Creatinine, Ser 0.87  0.50 - 1.10 mg/dL   Calcium 8.6  8.4 - 10.5 mg/dL   GFR calc non Af Amer 70 (*) >90 mL/min   GFR calc Af Amer 81 (*) >90 mL/min   Comment: (NOTE)     The eGFR has been calculated using the CKD EPI equation.     This calculation has not been validated in all clinical situations.     eGFR's persistently <90 mL/min signify possible Chronic Kidney     Disease.   Anion gap 9  5 - 15  CBC     Status: Abnormal   Collection Time    07/30/13  5:02 AM      Result Value Ref Range   WBC  20.5 (*) 4.0 - 10.5 K/uL   RBC 2.58 (*) 3.87 - 5.11 MIL/uL   Hemoglobin 8.2 (*) 12.0 - 15.0 g/dL   HCT 24.6 (*) 36.0 - 46.0 %   MCV 95.3  78.0 - 100.0 fL   MCH 31.8  26.0 - 34.0 pg   MCHC 33.3  30.0 - 36.0  g/dL   RDW 13.1  11.5 - 15.5 %   Platelets 262  150 - 400 K/uL  BASIC METABOLIC PANEL     Status: Abnormal   Collection Time    07/30/13  5:02 AM      Result Value Ref Range   Sodium 142  137 - 147 mEq/L   Potassium 3.9  3.7 - 5.3 mEq/L   Chloride 106  96 - 112 mEq/L   CO2 25  19 - 32 mEq/L   Glucose, Bld 135 (*) 70 - 99 mg/dL   BUN 15  6 - 23 mg/dL   Creatinine, Ser 0.84  0.50 - 1.10 mg/dL   Calcium 9.1  8.4 - 10.5 mg/dL   GFR calc non Af Amer 73 (*) >90 mL/min   GFR calc Af Amer 85 (*) >90 mL/min   Comment: (NOTE)     The eGFR has been calculated using the CKD EPI equation.     This calculation has not been validated in all clinical situations.     eGFR's persistently <90 mL/min signify possible Chronic Kidney     Disease.   Anion gap 11  5 - 15   Dg Pelvis Portable  07/28/2013   CLINICAL DATA:  Osteoarthritis of the left hip.  EXAM: PORTABLE PELVIS 1-2 VIEWS  COMPARISON:  Radiograph dated 07/12/2013  FINDINGS: The patient has undergone left total hip prosthesis insertion. The components appear in excellent position in the AP projection. No fracture. Soft tissue drain in place.  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 Osteopenia of the right femur remnant.  IMPRESSION: Satisfactory appearance of the left hip in the AP projection after total hip prosthesis insertion.   Electronically Signed   By: Rozetta Nunnery M.D.   On: 07/28/2013 13:36       Medical Problem List and Plan: 1. Functional deficits secondary to endstage OA of left hip in a patient with remote right AKA (doesn't wear prosthesis) 2.  DVT Prophylaxis/Anticoagulation: Pharmaceutical: Xarelto 3. Pain Management: tylenol, robaxin, ice , and heat. She is unable to tolerate narcotics  -self reported hx of fibromyalgia. Discussed appropriate technique, posture. 4. Mood: patient is motivated and in good spirits  -maintain buspar and celexa, hs doxepin at current doses.  5. Neuropsych: This  patient is capable of making decisions on her own behalf. 6. ABLA: iron supplementation 7. Leukocytosis: Low grade temp today. Wound is clean and lungs appear  -check ua and culture on admit  -consider cxr if symptoms dictate      Post Admission Physician Evaluation: 1. Functional deficits secondary  to OA of left hip s/p left THA, (hx of right AKA--doesn't use prosthesis). 2. Patient is admitted to receive collaborative, interdisciplinary care between the physiatrist, rehab nursing staff, and therapy team. 3. Patient's level of medical complexity and substantial therapy needs in context of that medical necessity cannot be provided at a lesser intensity of care such as a SNF. 4. Patient has experienced substantial functional loss from his/her baseline which was documented above under the "Functional History" and "Functional Status" headings.  Judging by the patient's diagnosis, physical  exam, and functional history, the patient has potential for functional progress which will result in measurable gains while on inpatient rehab.  These gains will be of substantial and practical use upon discharge  in facilitating mobility and self-care at the household level. 5. Physiatrist will provide 24 hour management of medical needs as well as oversight of the therapy plan/treatment and provide guidance as appropriate regarding the interaction of the two. 6. 24 hour rehab nursing will assist with bladder management, bowel management, safety, skin/wound care, disease management, medication administration, pain management and patient education  and help integrate therapy concepts, techniques,education, etc. 7. PT will assess and treat for/with: Lower extremity strength, range of motion, stamina, balance, functional mobility, safety, adaptive techniques and equipment, pain mgt, ortho precautions.   Goals are: mod I. 8. OT will assess and treat for/with: ADL's, functional mobility, safety, upper extremity  strength, adaptive techniques and equipment, pain mgt, ortho precautions, leisure awareness.   Goals are: mod I. 9. SLP will assess and treat for/with: n/a.  Goals are: n/a. 10. Case Management and Social Worker will assess and treat for psychological issues and discharge planning. 11. Team conference will be held weekly to assess progress toward goals and to determine barriers to discharge. 12. Patient will receive at least 3 hours of therapy per day at least 5 days per week. 13. ELOS: 5-7 days       14. Prognosis:  excellent     Meredith Staggers, MD, [facility]   07/30/2013

## 2013-07-30 NOTE — Discharge Summary (Signed)
Physician Discharge Summary   Patient ID: [name] MRN: [id number] DOB/AGE: Aug 30, 1950 63 y.o.  Admit date: 07/28/2013 Discharge date: 07/30/2013  Primary Diagnosis:  Osteoarthritis of the Left hip.   Admission Diagnoses:  Past Medical History  Diagnosis Date  . Complication of anesthesia     NAUSEA  . Chest pain     pt states due to esophageal problem -  primary care MD aware   . Hives     since pneumonia vaccine   . Asthma   . Depression   . Anxiety   . Fibromyalgia   . Spondylosis   . Bulging disc   . 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 Phantom limb pain     rt leg  . Leg pain, left   . Eczema     ears  . GERD (gastroesophageal reflux disease)     " alot of burping"  . Constipation, chronic   . Urgency incontinence   . History of transfusion   . Cancer     hx sarcoma rt knee with amputation  . Difficulty sleeping   . Sleep apnea     pt had recent sleep study done but does not know results - report called for and put on chart showing significant obstructive sleep apnea   Discharge Diagnoses:   Principal Problem:   OA (osteoarthritis) of hip Active Problems:   Fibromyalgia   GERD (gastroesophageal reflux disease)   Hiatal hernia   Postoperative anemia due to acute blood loss   Unspecified asthma(493.90)   Depression   Anxiety state, unspecified   History of Sarcoma, Bone   Phantom limb (syndrome)   Eczema   History of transfusion   Obstructive sleep apnea  Estimated body mass index is 28.16 kg/(m^2) as calculated from the following:   Height as of this encounter: '5\' 2"'  (1.575 m).   Weight as of this encounter: 69.854 kg (154 lb).  Procedure(s) (LRB): LEFT TOTAL HIP ARTHROPLASTY ANTERIOR APPROACH (Left)   Consults: [facility]  HPI: [name] is a 63 y.o. female who has advanced end-  stage arthritis of his Left hip with progressively worsening pain and  dysfunction.The patient has failed nonoperative management  and presents for  total hip arthroplasty.   Laboratory Data: Admission on 07/28/2013  Component Date Value Ref Range Status  . ABO/RH(D) 07/28/2013 O POS   Final  . Antibody Screen 07/28/2013 NEG   Final  . Sample Expiration 07/28/2013 07/31/2013   Final  . ABO/RH(D) 07/28/2013 O POS   Final  . Glucose-Capillary 07/28/2013 149* 70 - 99 mg/dL Final  . Comment 1 07/28/2013 Documented in Chart   Final  . WBC 07/29/2013 16.4* 4.0 - 10.5 K/uL Final  . RBC 07/29/2013 2.83* 3.87 - 5.11 MIL/uL Final  . Hemoglobin 07/29/2013 8.9* 12.0 - 15.0 g/dL Final  . HCT 07/29/2013 27.0* 36.0 - 46.0 % Final  . MCV 07/29/2013 95.4  78.0 - 100.0 fL Final  . MCH 07/29/2013 31.4  26.0 - 34.0 pg Final  . MCHC 07/29/2013 33.0  30.0 - 36.0 g/dL Final  . RDW 07/29/2013 12.7  11.5 - 15.5 % Final  . Platelets 07/29/2013 264  150 - 400 K/uL Final  . Sodium 07/29/2013 140  137 - 147 mEq/L Final  . Potassium 07/29/2013 4.5  3.7 - 5.3 mEq/L Final  . Chloride 07/29/2013 106  96 - 112 mEq/L Final  . CO2 07/29/2013  25  19 - 32 mEq/L Final  . Glucose, Bld 07/29/2013 156* 70 - 99 mg/dL Final  . BUN 07/29/2013 11  6 - 23 mg/dL Final  . Creatinine, Ser 07/29/2013 0.87  0.50 - 1.10 mg/dL Final  . Calcium 07/29/2013 8.6  8.4 - 10.5 mg/dL Final  . GFR calc non Af Amer 07/29/2013 70* >90 mL/min Final  . GFR calc Af Amer 07/29/2013 81* >90 mL/min Final   Comment: (NOTE)                          The eGFR has been calculated using the CKD EPI equation.                          This calculation has not been validated in all clinical situations.                          eGFR's persistently <90 mL/min signify possible Chronic Kidney                          Disease.  . Anion gap 07/29/2013 9  5 - 15 Final  . WBC 07/30/2013 20.5* 4.0 - 10.5 K/uL Final  . RBC 07/30/2013 2.58* 3.87 - 5.11 MIL/uL Final  . Hemoglobin 07/30/2013 8.2* 12.0 - 15.0 g/dL Final  . HCT 07/30/2013 24.6* 36.0 - 46.0 % Final  . MCV 07/30/2013 95.3  78.0 -  100.0 fL Final  . MCH 07/30/2013 31.8  26.0 - 34.0 pg Final  . MCHC 07/30/2013 33.3  30.0 - 36.0 g/dL Final  . RDW 07/30/2013 13.1  11.5 - 15.5 % Final  . Platelets 07/30/2013 262  150 - 400 K/uL Final  . Sodium 07/30/2013 142  137 - 147 mEq/L Final  . Potassium 07/30/2013 3.9  3.7 - 5.3 mEq/L Final  . Chloride 07/30/2013 106  96 - 112 mEq/L Final  . CO2 07/30/2013 25  19 - 32 mEq/L Final  . Glucose, Bld 07/30/2013 135* 70 - 99 mg/dL Final  . BUN 07/30/2013 15  6 - 23 mg/dL Final  . Creatinine, Ser 07/30/2013 0.84  0.50 - 1.10 mg/dL Final  . Calcium 07/30/2013 9.1  8.4 - 10.5 mg/dL Final  . GFR calc non Af Amer 07/30/2013 73* >90 mL/min Final  . GFR calc Af Amer 07/30/2013 85* >90 mL/min Final   Comment: (NOTE)                          The eGFR has been calculated using the CKD EPI equation.                          This calculation has not been validated in all clinical situations.                          eGFR's persistently <90 mL/min signify possible Chronic Kidney                          Disease.  . Anion gap 07/30/2013 11  5 - 15 Final  Admission on 07/12/2013, Discharged on 07/14/2013  Component Date Value Ref Range Status  . WBC 07/12/2013 10.9* 4.0 - 10.5 K/uL Final  . RBC 07/12/2013 3.96  3.87 - 5.11 MIL/uL Final  . Hemoglobin 07/12/2013 12.3  12.0 - 15.0 g/dL Final  . HCT 07/12/2013 37.5  36.0 - 46.0 % Final  . MCV 07/12/2013 94.7  78.0 - 100.0 fL Final  . MCH 07/12/2013 31.1  26.0 - 34.0 pg Final  . MCHC 07/12/2013 32.8  30.0 - 36.0 g/dL Final  . RDW 07/12/2013 12.6  11.5 - 15.5 % Final  . Platelets 07/12/2013 385  150 - 400 K/uL Final  . Sodium 07/12/2013 141  137 - 147 mEq/L Final  . Potassium 07/12/2013 4.3  3.7 - 5.3 mEq/L Final  . Chloride 07/12/2013 105  96 - 112 mEq/L Final  . CO2 07/12/2013 24  19 - 32 mEq/L Final  . Glucose, Bld 07/12/2013 105* 70 - 99 mg/dL Final  . BUN 07/12/2013 12  6 - 23 mg/dL Final  . Creatinine, Ser 07/12/2013 0.91  0.50 - 1.10 mg/dL  Final  . Calcium 07/12/2013 9.1  8.4 - 10.5 mg/dL Final  . GFR calc non Af Amer 07/12/2013 66* >90 mL/min Final  . GFR calc Af Amer 07/12/2013 77* >90 mL/min Final   Comment: (NOTE)                          The eGFR has been calculated using the CKD EPI equation.                          This calculation has not been validated in all clinical situations.                          eGFR's persistently <90 mL/min signify possible Chronic Kidney                          Disease.  . Troponin i, poc 07/12/2013 0.00  0.00 - 0.08 ng/mL Final  . Comment 3 07/12/2013          Final   Comment: Due to the release kinetics of cTnI,                          a negative result within the first hours                          of the onset of symptoms does not rule out                          myocardial infarction with certainty.                          If myocardial infarction is still suspected,                          repeat the test at appropriate intervals.  Marland Kitchen D-Dimer, Quant 07/12/2013 0.86* 0.00 - 0.48 ug/mL-FEU Final   Comment:                                 AT THE INHOUSE ESTABLISHED CUTOFF                          VALUE  OF 0.48 ug/mL FEU,                          THIS ASSAY HAS BEEN DOCUMENTED                          IN THE LITERATURE TO HAVE                          A SENSITIVITY AND NEGATIVE                          PREDICTIVE VALUE OF AT LEAST                          98 TO 99%.  THE TEST RESULT                          SHOULD BE CORRELATED WITH                          AN ASSESSMENT OF THE CLINICAL                          PROBABILITY OF DVT / VTE.  Marland Kitchen Troponin i, poc 07/12/2013 0.00  0.00 - 0.08 ng/mL Final  . Comment 3 07/12/2013          Final   Comment: Due to the release kinetics of cTnI,                          a negative result within the first hours                          of the onset of symptoms does not rule out                          myocardial infarction with  certainty.                          If myocardial infarction is still suspected,                          repeat the test at appropriate intervals.  . Sodium 07/13/2013 136* 137 - 147 mEq/L Final  . Potassium 07/13/2013 4.3  3.7 - 5.3 mEq/L Final  . Chloride 07/13/2013 102  96 - 112 mEq/L Final  . CO2 07/13/2013 21  19 - 32 mEq/L Final  . Glucose, Bld 07/13/2013 235* 70 - 99 mg/dL Final  . BUN 07/13/2013 16  6 - 23 mg/dL Final  . Creatinine, Ser 07/13/2013 0.88  0.50 - 1.10 mg/dL Final  . Calcium 07/13/2013 9.1  8.4 - 10.5 mg/dL Final  . Total Protein 07/13/2013 7.1  6.0 - 8.3 g/dL Final  . Albumin 07/13/2013 3.3* 3.5 - 5.2 g/dL Final  . AST 07/13/2013 17  0 - 37 U/L Final  . ALT 07/13/2013 15  0 - 35 U/L Final  . Alkaline Phosphatase 07/13/2013 77  39 - 117 U/L Final  . Total Bilirubin 07/13/2013 0.2* 0.3 - 1.2 mg/dL Final  . GFR calc non  Af Amer 07/13/2013 69* >90 mL/min Final  . GFR calc Af Amer 07/13/2013 80* >90 mL/min Final   Comment: (NOTE)                          The eGFR has been calculated using the CKD EPI equation.                          This calculation has not been validated in all clinical situations.                          eGFR's persistently <90 mL/min signify possible Chronic Kidney                          Disease.  . WBC 07/13/2013 17.5* 4.0 - 10.5 K/uL Final  . RBC 07/13/2013 3.72* 3.87 - 5.11 MIL/uL Final  . Hemoglobin 07/13/2013 11.7* 12.0 - 15.0 g/dL Final  . HCT 07/13/2013 35.0* 36.0 - 46.0 % Final  . MCV 07/13/2013 94.1  78.0 - 100.0 fL Final  . MCH 07/13/2013 31.5  26.0 - 34.0 pg Final  . MCHC 07/13/2013 33.4  30.0 - 36.0 g/dL Final  . RDW 07/13/2013 12.6  11.5 - 15.5 % Final  . Platelets 07/13/2013 350  150 - 400 K/uL Final  . Troponin I 07/12/2013 <0.30  <0.30 ng/mL Final   Comment:                                 Due to the release kinetics of cTnI,                          a negative result within the first hours                          of the  onset of symptoms does not rule out                          myocardial infarction with certainty.                          If myocardial infarction is still suspected,                          repeat the test at appropriate intervals.  . Troponin I 07/13/2013 <0.30  <0.30 ng/mL Final   Comment:                                 Due to the release kinetics of cTnI,                          a negative result within the first hours                          of the onset of symptoms does not rule out                          myocardial  infarction with certainty.                          If myocardial infarction is still suspected,                          repeat the test at appropriate intervals.  . Troponin I 07/13/2013 <0.30  <0.30 ng/mL Final   Comment:                                 Due to the release kinetics of cTnI,                          a negative result within the first hours                          of the onset of symptoms does not rule out                          myocardial infarction with certainty.                          If myocardial infarction is still suspected,                          repeat the test at appropriate intervals.  Marland Kitchen MRSA, PCR 07/12/2013 NEGATIVE  NEGATIVE Final  . Staphylococcus aureus 07/12/2013 NEGATIVE  NEGATIVE Final   Comment:                                 The Xpert SA Assay (FDA                          approved for NASAL specimens                          in patients over 61 years of age),                          is one component of                          a comprehensive surveillance                          program.  Test performance has                          been validated by [employer] for patients greater                          than or equal to 69 year old.                          It is not intended  to diagnose infection nor to                          guide or monitor  treatment.  . Hemoglobin A1C 07/13/2013 5.9* <5.7 % Final   Comment: (NOTE)                                                                                                                         According to the ADA Clinical Practice Recommendations for 2011, when                          HbA1c is used as a screening test:                           >=6.5%   Diagnostic of Diabetes Mellitus                                    (if abnormal result is confirmed)                          5.7-6.4%   Increased risk of developing Diabetes Mellitus                          References:Diagnosis and Classification of Diabetes Mellitus,Diabetes                          ZOXW,9604,54(UJWJX 1):S62-S69 and Standards of Medical Care in                                  Diabetes - 2011,Diabetes BJYN,8295,62 (Suppl 1):S11-S61.  . Mean Plasma Glucose 07/13/2013 123* <117 mg/dL Final   Performed at [facility]  . Cholesterol 07/14/2013 153  0 - 200 mg/dL Final  . Triglycerides 07/14/2013 135  <150 mg/dL Final  . HDL 07/14/2013 37* >39 mg/dL Final  . Total CHOL/HDL Ratio 07/14/2013 4.1   Final  . VLDL 07/14/2013 27  0 - 40 mg/dL Final  . LDL Cholesterol 07/14/2013 89  0 - 99 mg/dL Final   Comment:                                 Total Cholesterol/HDL:CHD Risk                          Coronary Heart Disease Risk Table  Men   Women                           1/2 Average Risk   3.4   3.3                           Average Risk       5.0   4.4                           2 X Average Risk   9.6   7.1                           3 X Average Risk  23.4   11.0                                                          Use the calculated Patient Ratio                          above and the CHD Risk Table                          to determine the patient's CHD Risk.                                                          ATP III CLASSIFICATION (LDL):                            <100     mg/dL   Optimal                           100-129  mg/dL   Near or Above                                             Optimal                           130-159  mg/dL   Borderline                           160-189  mg/dL   High                           >190     mg/dL   Very High                          Performed at [facility] Outpatient Visit on 07/12/2013  Component Date Value Ref Range Status  . aPTT 07/12/2013 35  24 - 37  seconds Final  . WBC 07/12/2013 11.2* 4.0 - 10.5 K/uL Final  . RBC 07/12/2013 3.86* 3.87 - 5.11 MIL/uL Final  . Hemoglobin 07/12/2013 12.2  12.0 - 15.0 g/dL Final  . HCT 07/12/2013 36.7  36.0 - 46.0 % Final  . MCV 07/12/2013 95.1  78.0 - 100.0 fL Final  . MCH 07/12/2013 31.6  26.0 - 34.0 pg Final  . MCHC 07/12/2013 33.2  30.0 - 36.0 g/dL Final  . RDW 07/12/2013 12.6  11.5 - 15.5 % Final  . Platelets 07/12/2013 364  150 - 400 K/uL Final  . Prothrombin Time 07/12/2013 13.1  11.6 - 15.2 seconds Final  . INR 07/12/2013 0.99  0.00 - 1.49 Final  . Sodium 07/12/2013 142  137 - 147 mEq/L Final  . Potassium 07/12/2013 4.4  3.7 - 5.3 mEq/L Final  . Chloride 07/12/2013 105  96 - 112 mEq/L Final  . CO2 07/12/2013 24  19 - 32 mEq/L Final  . Glucose, Bld 07/12/2013 122* 70 - 99 mg/dL Final  . BUN 07/12/2013 11  6 - 23 mg/dL Final  . Creatinine, Ser 07/12/2013 0.92  0.50 - 1.10 mg/dL Final  . Calcium 07/12/2013 9.1  8.4 - 10.5 mg/dL Final  . Total Protein 07/12/2013 7.7  6.0 - 8.3 g/dL Final  . Albumin 07/12/2013 3.7  3.5 - 5.2 g/dL Final  . AST 07/12/2013 22  0 - 37 U/L Final  . ALT 07/12/2013 16  0 - 35 U/L Final  . Alkaline Phosphatase 07/12/2013 84  39 - 117 U/L Final  . Total Bilirubin 07/12/2013 0.3  0.3 - 1.2 mg/dL Final  . GFR calc non Af Amer 07/12/2013 65* >90 mL/min Final  . GFR calc Af Amer 07/12/2013 76* >90 mL/min Final   Comment: (NOTE)                          The eGFR has been calculated using the CKD EPI equation.                           This calculation has not been validated in all clinical situations.                          eGFR's persistently <90 mL/min signify possible Chronic Kidney                          Disease.  . Color, Urine 07/12/2013 YELLOW  YELLOW Final  . APPearance 07/12/2013 CLEAR  CLEAR Final  . Specific Gravity, Urine 07/12/2013 1.002* 1.005 - 1.030 Final  . pH 07/12/2013 6.5  5.0 - 8.0 Final  . Glucose, UA 07/12/2013 NEGATIVE  NEGATIVE mg/dL Final  . Hgb urine dipstick 07/12/2013 NEGATIVE  NEGATIVE Final  . Bilirubin Urine 07/12/2013 NEGATIVE  NEGATIVE Final  . Ketones, ur 07/12/2013 NEGATIVE  NEGATIVE mg/dL Final  . Protein, ur 07/12/2013 NEGATIVE  NEGATIVE mg/dL Final  . Urobilinogen, UA 07/12/2013 0.2  0.0 - 1.0 mg/dL Final  . Nitrite 07/12/2013 NEGATIVE  NEGATIVE Final  . Leukocytes, UA 07/12/2013 NEGATIVE  NEGATIVE Final   MICROSCOPIC NOT DONE ON URINES WITH NEGATIVE PROTEIN, BLOOD, LEUKOCYTES, NITRITE, OR GLUCOSE <1000 mg/dL.     X-Rays:Dg Chest 2 View  07/12/2013   CLINICAL DATA:  PREOP L TOTAL HIP / HX HBP  EXAM: CHEST  2 VIEW  COMPARISON:  None.  FINDINGS: The heart size and mediastinal contours are within normal limits. Both lungs are clear. The visualized skeletal structures are unremarkable. Minimal discoid atelectasis versus scarring in the lung bases.  IMPRESSION: No active cardiopulmonary disease.   Electronically Signed   By: Margaree Mackintosh M.D.   On: 07/12/2013 13:16   Dg Hip Complete Left  07/12/2013   CLINICAL DATA:  Left hip osteoarthritis.  EXAM: LEFT HIP - COMPLETE 2+ VIEW  COMPARISON:  None.  FINDINGS: Severe narrowing of the superior portion of the left hip joint is noted consistent with osteoarthritis. No fracture or dislocation is noted.  IMPRESSION: Severe osteoarthritis of left hip is noted.   Electronically Signed   By: Sabino Dick M.D.   On: 07/12/2013 13:16   Ct Angio Chest Pe W/cm &/or Wo Cm  07/12/2013   CLINICAL DATA:  Elevated D-dimers, chest pain   EXAM: CT ANGIOGRAPHY CHEST WITH CONTRAST  TECHNIQUE: Multidetector CT imaging of the chest was performed using the standard protocol during bolus administration of intravenous contrast. Multiplanar CT image reconstructions and MIPs were obtained to evaluate the vascular anatomy.  CONTRAST:  134m OMNIPAQUE IOHEXOL 350 MG/ML SOLN  COMPARISON:  Chest x-ray July 12, 2013  FINDINGS: There is no pulmonary embolus. There is no mediastinal or hilar lymphadenopathy. There is no pulmonary edema or pleural effusion. There is a 2 mm nodule in the left upper lobe image 16. There is a 1.5 cm nodular density in the posterior basal segment of the right lower lobe image 61. The heart size is normal. There is no pericardial effusion. The visualized upper abdominal structures are unremarkable. There is a minimal hiatal hernia. Degenerative joint changes of the spine are noted.  Review of the MIP images confirms the above findings.  IMPRESSION: No pulmonary embolus. No acute abnormality identified within the chest.  Pulmonary nodules as described. The largest nodule measures 1.5 cm in the right lower lobe. Further evaluation with PET CT is recommended.   Electronically Signed   By: WAbelardo DieselM.D.   On: 07/12/2013 17:26   Dg Pelvis Portable  07/28/2013   CLINICAL DATA:  Osteoarthritis of the left hip.  EXAM: PORTABLE PELVIS 1-2 VIEWS  COMPARISON:  Radiograph dated 07/12/2013  FINDINGS: The patient has undergone left total hip prosthesis insertion. The components appear in excellent position in the AP projection. No fracture. Soft tissue drain in place.  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 Osteopenia of the right femur remnant.  IMPRESSION: Satisfactory appearance of the left hip in the AP projection after total hip prosthesis insertion.   Electronically Signed   By: JRozetta NunneryM.D.   On: 07/28/2013 13:36   Nm Myocar Multi W/spect W/wall Motion / Ef  07/14/2013   CLINICAL DATA:  Chest pain.   EXAM: MYOCARDIAL IMAGING WITH SPECT (REST AND PHARMACOLOGIC-STRESS)  GATED LEFT VENTRICULAR WALL MOTION STUDY  LEFT VENTRICULAR EJECTION FRACTION  TECHNIQUE: Standard myocardial SPECT imaging was performed after resting intravenous injection of 10 mCi [id number]. Subsequently, intravenous infusion of Lexiscan was performed under the supervision of the Cardiology staff. At peak effect of the drug, 30 mCi [id number] was injected intravenously and standard myocardial SPECT imaging was performed. Quantitative gated imaging was also performed to evaluate left ventricular wall motion, and estimate left ventricular ejection fraction.  COMPARISON:  None.  FINDINGS: Exam quality is good. Myocardial SPECT shows uniform distribution of radiopharmaceutical activity through the left ventricular myocardium on both  stress and rest images. No reversible or fixed myocardial perfusion defects are identified.  Left ventricular wall motion study shows no evidence of regional wall motion abnormality.  Estimated left ventricular ejection fraction is 80%.  IMPRESSION: No evidence of pharmacologic induced myocardial ischemia.  Normal left ventricular wall motion study, with calculated ejection fraction of 80%.   Electronically Signed   By: Earle Gell M.D.   On: 07/14/2013 17:39   Dg C-arm 61-120 Min-no Report  07/28/2013   CLINICAL DATA: left anterior hip   C-ARM 61-120 MINUTES  Fluoroscopy was utilized by the requesting physician.  No radiographic  interpretation.     EKG: Orders placed during the hospital encounter of 07/12/13  . EKG 12-LEAD  . EKG 12-LEAD  . EKG 12-LEAD  . EKG 12-LEAD  . EKG     Hospital Course: Patient was admitted to [facility] and taken to the OR and underwent the above state procedure without complications.  Patient tolerated the procedure well and was later transferred to the recovery room and then to the orthopaedic floor for postoperative care.  They were given PO and IV  analgesics for pain control following their surgery.  They were given 24 hours of postoperative antibiotics of  Anti-infectives   Start     Dose/Rate Route Frequency Ordered Stop   07/28/13 2230  vancomycin (VANCOCIN) IVPB 1000 mg/200 mL premix     1,000 mg 200 mL/hr over 60 Minutes Intravenous Every 12 hours 07/28/13 1504 07/28/13 2349   07/28/13 0815  vancomycin (VANCOCIN) IVPB 1000 mg/200 mL premix     1,000 mg 200 mL/hr over 60 Minutes Intravenous  Once 07/28/13 0813 07/28/13 1130   07/28/13 0758  ceFAZolin (ANCEF) IVPB 2 g/50 mL premix  Status:  Discontinued     2 g 100 mL/hr over 30 Minutes Intravenous On call to O.R. 07/28/13 2563 07/28/13 8937     and started on DVT prophylaxis in the form of Xarelto.   PT and OT were ordered for total hip protocol.  The patient was allowed to be WBAT with therapy. Discharge planning was consulted to help with postop disposition and equipment needs.  Patient had a good night on the evening of surgery and able to get some rest.  She took a few steps the evening of surgery.  They started to get up OOB with therapy on day one. CIR was consulted to evaluate for possible inpatient rehab. Hemovac drain was pulled without difficulty.  Continued to work with therapy into day two.  Dressing was changed on day two and the incision was healing well. CIR felt that she was appropriate for rehab.  Patient was seen in rounds and was ready to rehab.  Awaiting insurance approval prior to transfer today.   Discharge to CIR if insurance comes through Diet - Cardiac diet  Follow up - in 2 weeks  Activity - WBAT  Disposition - Rehab  Condition Upon Discharge - Good at time of rounds  D/C Meds - See DC Summary  DVT Prophylaxis - Xarelto       Discharge Instructions   Call MD / Call 911    Complete by:  As directed   If you experience chest pain or shortness of breath, CALL 911 and be transported to the hospital emergency room.  If you develope a fever above 101 F,  pus (white drainage) or increased drainage or redness at the wound, or calf pain, call your surgeon's office.  Change dressing    Complete by:  As directed   You may change your dressing dressing daily with sterile 4 x 4 inch gauze dressing and paper tape.  Do not submerge the incision under water.     Constipation Prevention    Complete by:  As directed   Drink plenty of fluids.  Prune juice may be helpful.  You may use a stool softener, such as Colace (over the counter) 100 mg twice a day.  Use MiraLax (over the counter) for constipation as needed.     Diet - low sodium heart healthy    Complete by:  As directed      Discharge instructions    Complete by:  As directed   Pick up stool softner and laxative for home. Do not submerge incision under water. May shower. Continue to use ice for pain and swelling from surgery.  Total Hip Protocol.  Take Xarelto for two and a half more weeks, then discontinue Xarelto. Once the patient has completed the blood thinner regimen, then take a Baby 81 mg Aspirin daily for three more weeks.  When discharged from the skilled rehab facility, please have the facility set up the patient's [place] prior to being released.  Also provide the patient with their medications at time of release from the facility to include their pain medication, the muscle relaxants, and their blood thinner medication.  If the patient is still at the rehab facility at time of follow up appointment, please also assist the patient in arranging follow up appointment in our office and any transportation needs.     Do not sit on low chairs, stoools or toilet seats, as it may be difficult to get up from low surfaces    Complete by:  As directed      Driving restrictions    Complete by:  As directed   No driving until released by the physician.     Increase activity slowly as tolerated    Complete by:  As directed      Lifting restrictions    Complete by:  As  directed   No lifting until released by the physician.     Patient may shower    Complete by:  As directed   You may shower without a dressing once there is no drainage.  Do not wash over the wound.  If drainage remains, do not shower until drainage stops.     TED hose    Complete by:  As directed   Use stockings (TED hose) for 3 weeks on both leg(s).  You may remove them at night for sleeping.     Weight bearing as tolerated    Complete by:  As directed             Medication List    STOP taking these medications       Vitamin D 2000 UNITS tablet      TAKE these medications       acetaminophen 325 MG tablet  Commonly known as:  TYLENOL  Take 2 tablets (650 mg total) by mouth every 6 (six) hours as needed for mild pain (or Fever >/= 101).     albuterol 108 (90 BASE) MCG/ACT inhaler  Commonly known as:  PROVENTIL HFA;VENTOLIN HFA  Inhale 2 puffs into the lungs every 6 (six) hours as needed for wheezing or shortness of breath.     bisacodyl 10 MG suppository  Commonly known as:  DULCOLAX  Place 1 suppository (10 mg total) rectally daily as needed for mild constipation or moderate constipation.     buPROPion 150 MG 12 hr tablet  Commonly known as:  WELLBUTRIN SR  Take 150 mg by mouth 2 (two) times daily.     busPIRone 10 MG tablet  Commonly known as:  BUSPAR  Take 10 mg by mouth 2 (two) times daily.     citalopram 10 MG tablet  Commonly known as:  CELEXA  Take 10 mg by mouth every morning.     cyclobenzaprine 10 MG tablet  Commonly known as:  FLEXERIL  Take 10 mg by mouth 2 (two) times daily.     diphenhydrAMINE 25 MG tablet  Commonly known as:  BENADRYL  Take 1 tablet (25 mg total) by mouth every 6 (six) hours as needed for itching.     doxepin 10 MG capsule  Commonly known as:  SINEQUAN  Take 10 mg by mouth at bedtime.     DSS 100 MG Caps  Take 100 mg by mouth 2 (two) times daily.     esomeprazole 20 MG capsule  Commonly known as:  NEXIUM  Take 1  capsule (20 mg total) by mouth 2 (two) times daily before a meal.     fexofenadine 180 MG tablet  Commonly known as:  ALLEGRA  Take 180 mg by mouth daily.     fluocinolone 0.01 % external solution  Commonly known as:  SYNALAR  Place 2 drops into both ears 2 (two) times daily as needed (itching).     irbesartan 150 MG tablet  Commonly known as:  AVAPRO  Take 150 mg by mouth 2 (two) times daily.     iron polysaccharides 150 MG capsule  Commonly known as:  NIFEREX  Take 1 capsule (150 mg total) by mouth 2 (two) times daily.     methocarbamol 500 MG tablet  Commonly known as:  ROBAXIN  Take 1 tablet (500 mg total) by mouth every 6 (six) hours as needed for muscle spasms.     metoCLOPramide 5 MG tablet  Commonly known as:  REGLAN  Take 1-2 tablets (5-10 mg total) by mouth every 8 (eight) hours as needed for nausea (if ondansetron (ZOFRAN) ineffective.).     ondansetron 4 MG tablet  Commonly known as:  ZOFRAN  Take 1 tablet (4 mg total) by mouth every 6 (six) hours as needed for nausea.     oxyCODONE 5 MG immediate release tablet  Commonly known as:  Oxy IR/ROXICODONE  Take 1-2 tablets (5-10 mg total) by mouth every 3 (three) hours as needed for moderate pain, severe pain or breakthrough pain.     polyethylene glycol packet  Commonly known as:  MIRALAX / GLYCOLAX  Take 17 g by mouth daily as needed for mild constipation or moderate constipation.     predniSONE 50 MG tablet  Commonly known as:  DELTASONE  Take 1 tablet (50 mg total) by mouth daily with breakfast.     rivaroxaban 10 MG Tabs tablet  Commonly known as:  XARELTO  - Take 1 tablet (10 mg total) by mouth daily with breakfast. Take Xarelto for two and a half more weeks, then discontinue Xarelto.  - Once the patient has completed the blood thinner regimen, then take a Baby 81 mg Aspirin daily for three more weeks.       Follow-up Information   Follow up with [name], MD. Schedule an appointment as soon as  possible for a  visit in 2 weeks.   Specialty:  Orthopedic Surgery   Contact information:   [address] [place] [address] [phone]       Signed: Arlee Muslim, PA-C Orthopaedic Surgery 07/30/2013, 9:01 AM

## 2013-07-30 NOTE — Progress Notes (Signed)
Patient stated she did not need to wear CPAP tonight.  She states she is going to have her home CPAP unit brought to her tomorrow. Patient is aware to ask RN to call RT is she changes her mind.

## 2013-07-30 NOTE — Progress Notes (Signed)
Rehab admissions - Evaluated for possible admission.  I spoke with patient and she would like inpatient rehab admission.  I have called [employer] and have faxed clinicals requesting acute inpatient rehab admission.  I hope to hear back from [facility] today.  Call me for questions.  #[phone]

## 2013-07-30 NOTE — PMR Pre-admission (Signed)
PMR Admission Coordinator Pre-Admission Assessment  Patient: [name] is an 63 y.o., female MRN: [id number] DOB: Oct 02, 1950 Height: _0  (157.5 cm) Weight: 69.854 kg (154 lb)              Insurance Information HMO:      PPO: Yes     PCP:       IPA:       80/20:       OTHER: Group # [id number] PRIMARY: [employer] P[place]      Policy#: [id number]      Subscriber: [name] CM Name: Fraser Din      Phone#: [phone]     Fax#: [phone] Pre-Cert#: [id number]      Employer: Spouse works [employer] Benefits:  Phone #: [phone]     Name: Theador Hawthorne. Date: 03/15/11     Deduct: $750 (met)      Out of Pocket Max: $3000 (met $1204.47)      Life Max: unlimited CIR: $500 copay per admit and then 80%      SNF: $500 copay per admit and then 80% Outpatient: 80% with 20 visits per year     Co-Pay: 20% Home Health: 80% with 60 visits per year      Co-Pay: 20% DME: 80%     Co-Pay: 20% Providers: in network  Note:  If no response from [facility], can call Manuela Schwartz, [place] at [phone].  Fraser Din will be on vacation next week.   Emergency Contact Information Contact Information   Name Relation Home Work [place] Spouse [phone]     Current Medical History  Patient Admitting Diagnosis:  R THR, L AKA  History of Present Illness: A 62 y.o. 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 She has failed conservative measures and elected to undergo L-THR by Dr. Wynelle Link on 07/28/13. Post op with leucocytosis as well as ABLA. She is WBAT and on Xarelto for DVT prophylaxis. PT evaluation done yesterday showing patient requiring significant amount of assistance. CIR recommended by threapy and MD.  Patient reports that she has had  problems with transfers and mobility for past 2 years. She sits in her recliner chair most of the day. Taking a shower wears her out for the day. Phantom pain RLE as well as sciatica are also a limiting factor limiting mobility at times. Her husband takes care of managing the home and plans on taking two weeks off to assist past discharge.      Past Medical History  Past Medical History  Diagnosis Date  . Complication of anesthesia     NAUSEA  . Chest pain     pt states due to esophageal problem -  primary care MD aware   . Hives     since pneumonia vaccine   . Asthma   . Depression   . Anxiety   . Fibromyalgia   . Spondylosis   . Bulging disc   . 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 Phantom limb pain     rt leg  . Leg pain, left   . Eczema     ears  . GERD (gastroesophageal reflux disease)     " alot of  burping"  . Constipation, chronic   . Urgency incontinence   . History of transfusion   . Cancer     hx sarcoma rt knee with amputation  . Difficulty sleeping   . Sleep apnea     pt had recent sleep study done but does not know results - report called for and put on chart showing significant obstructive sleep apnea    Family History  family history includes CAD in her father and sister.  Prior Rehab/Hospitalizations:  Had outpatient PT in [place] 2 1/2 yrs ago for core therapy.   Current Medications  Current facility-administered medications:0.9 %  sodium chloride infusion, , Intravenous, Continuous, Arlee Muslim, PA-C;  acetaminophen (TYLENOL) suppository 650 mg, 650 mg, Rectal, Q6H PRN, [name], MD;  acetaminophen (TYLENOL) tablet 650 mg, 650 mg, Oral, Q6H PRN, [name], MD, 650 mg at 07/30/13 0955;  albuterol (PROVENTIL) (2.5 MG/3ML) 0.083% nebulizer solution 2.5 mg, 2.5 mg, Nebulization, Q6H PRN, [name], MD bisacodyl (DULCOLAX) suppository 10 mg, 10 mg, Rectal, Daily PRN, [name], MD;  buPROPion [facility] SR) 12 hr tablet 150  mg, 150 mg, Oral, BID, [name], MD, 150 mg at 07/30/13 0955;  busPIRone (BUSPAR) tablet 10 mg, 10 mg, Oral, BID, [name], MD, 10 mg at 07/30/13 0955;  citalopram (CELEXA) tablet 10 mg, 10 mg, Oral, q morning - 10a, [name], MD, 10 mg at 07/30/13 0955 diphenhydrAMINE (BENADRYL) 12.5 MG/5ML elixir 12.5-25 mg, 12.5-25 mg, Oral, Q4H PRN, [name], MD;  docusate sodium (COLACE) capsule 100 mg, 100 mg, Oral, BID, [name], MD, 100 mg at 07/30/13 0955;  doxepin (SINEQUAN) capsule 10 mg, 10 mg, Oral, QHS, [name], MD, 10 mg at 07/29/13 2206;  esomeprazole (NEXIUM) capsule 40 mg, 40 mg, Oral, BID AC, [name], MD, 40 mg at 07/30/13 0727 irbesartan (AVAPRO) tablet 150 mg, 150 mg, Oral, BID, Arlee Muslim, PA-C, 150 mg at 07/29/13 2205;  iron polysaccharides (NIFEREX) capsule 150 mg, 150 mg, Oral, Daily, Arlee Muslim, PA-C, 150 mg at 07/30/13 4196;  loratadine (CLARITIN) tablet 10 mg, 10 mg, Oral, Daily, [name], MD, 10 mg at 07/30/13 0955;  menthol-cetylpyridinium (CEPACOL) lozenge 3 mg, 1 lozenge, Oral, PRN, [name], MD methocarbamol (ROBAXIN) 500 mg in dextrose 5 % 50 mL IVPB, 500 mg, Intravenous, Q6H PRN, [name], MD, 500 mg at 07/28/13 1533;  methocarbamol (ROBAXIN) tablet 500 mg, 500 mg, Oral, Q6H PRN, [name], MD, 500 mg at 07/30/13 0955;  metoCLOPramide (REGLAN) injection 5-10 mg, 5-10 mg, Intravenous, Q8H PRN, [name], MD;  metoCLOPramide (REGLAN) tablet 5-10 mg, 5-10 mg, Oral, Q8H PRN, [name], MD morphine 2 MG/ML injection 1-2 mg, 1-2 mg, Intravenous, Q1H PRN, [name], MD;  ondansetron (ZOFRAN) injection 4 mg, 4 mg, Intravenous, Q6H PRN, [name], MD;  ondansetron (ZOFRAN) tablet 4 mg, 4 mg, Oral, Q6H PRN, Gaynelle Arabian V, MD;  oxyCODONE (Oxy IR/ROXICODONE) immediate release tablet 5-10 mg, 5-10 mg, Oral, Q3H PRN, Gaynelle Arabian V, MD, 10 mg at 07/29/13 0557 phenol (CHLORASEPTIC) mouth spray 1  spray, 1 spray, Mouth/Throat, PRN, [name], MD;  polyethylene glycol (MIRALAX / GLYCOLAX) packet 17 g, 17 g, Oral, Daily PRN, [name], MD;  rivaroxaban (XARELTO) tablet 10 mg, 10 mg, Oral, Q breakfast, [name], MD, 10 mg at 07/30/13 2229  Patients Current Diet: General  Precautions / Restrictions Precautions Precautions:  Fall Precaution Comments: R AKA - no prosthesis Restrictions Weight Bearing Restrictions: No Other Position/Activity Restrictions: WBAT   Prior Activity Level Household: Homebound lately with outings for MD appointments  Home Assistive Devices / [place] Devices/Equipment: Crutches;Walker (specify type);Bedside commode/3-in-1;Wheelchair;Eyeglasses  Prior Functional Level Prior Function Level of Independence: Independent with assistive device(s) Comments: used crutches but has been very sedentary x ~2 yrs 2* hip pain  Current Functional Level Cognition  Overall Cognitive Status: Within Functional Limits for tasks assessed Orientation Level: Oriented X4    Extremity Assessment (includes Sensation/Coordination)  Upper Extremity Assessment: Overall WFL for tasks assessed  Lower Extremity Assessment: RLE deficits/detail;LLE deficits/detail  RLE Deficits / Details: AKA  LLE Deficits / Details: 2+/5 hip strength with AAROM at hip to 90 flex and 30 abd  Cervical / Trunk Assessment: Normal     ADLs  Overall ADL's : Needs assistance/impaired Grooming: Set up;Sitting Upper Body Bathing: Set up;Sitting Lower Body Bathing: Moderate assistance;+2 for safety/equipment;Sit to/from stand Upper Body Dressing : Set up;Sitting Lower Body Dressing: Minimal assistance;With adaptive equipment;Sit to/from stand Toilet Transfer: Minimal assistance;Cueing for safety;Stand-pivot;BSC Toileting- Clothing Manipulation and Hygiene: Minimal assistance;Sitting/lateral lean;Sit to/from stand General ADL Comments: pt practiced with reacher and sock  aide at eob: when using sock aide, lost balance posteriorly requiring assistance to prevent fall back onto bed.  Pt used hospital commode, which is wider than standard and was able to weight shift/lateral shift to pull panties up prior to standing.  Educated husband and pt on tub bench and cutting cheap liner to fit between panels.      Mobility  Overal bed mobility: Needs Assistance Bed Mobility: Supine to Sit;Sit to Supine Supine to sit: Min guard Sit to supine: Min guard General bed mobility comments: Pt used rails and HOB was 30 degrees; pt stiff this am    Transfers  Overall transfer level: Needs assistance Equipment used: Rolling walker (2 wheeled) Transfers: Sit to/from Stand Sit to Stand: Min assist;From elevated surface General transfer comment: cues for hand placement    Ambulation / Gait / Stairs / Wheelchair Mobility  Ambulation/Gait Ambulation/Gait assistance: +2 physical assistance;Min assist;Mod assist Ambulation Distance (Feet): 24 Feet (twice) Assistive device: Rolling walker (2 wheeled) Gait Pattern/deviations: Step-to pattern;Decreased step length - right;Decreased step length - left;Shuffle Gait velocity: decr General Gait Details: cues for sequence, posture and position from RW: physical assist for balance and support     Posture / Balance      Special needs/care consideration BiPAP/CPAP Yes, started on CPAP at home 1 week ago.  Using CPAP in the hospital. CPM No Continuous Drip IV No Dialysis No       Life Vest No Oxygen No Special Bed No Trach Size No Wound Vac (area) No       Skin L hip dressing.  Has hives from allergic reaction to PNA vaccine - currently on steroids                             Bowel mgmt: No BM for the past 4 days.  Does have problems with constipation. Bladder mgmt: Voiding on BSC with some incontinence. Diabetic mgmt Borderline diabetic per patient.    Previous Home Environment Living Arrangements: Spouse/significant other Home  Care Services: No Additional Comments: has tub, high commodes--one higher than comfort height; doesn't have 24/7  Discharge Living Setting Plans for Discharge Living Setting: Patient's home;House;Lives with (comment) (Lives with husband.) Type of Home at  Discharge: House Discharge Home Layout: One level Discharge Home Access: Stairs to enter Entrance Stairs-Number of Steps: 1 step carport entrance Does the patient have any problems obtaining your medications?: No  Social/Family/Support Systems Patient Roles: Spouse;Parent (Has a husband and 2 daughters.) Contact Information: Shardae Kleinman - husband Anticipated Caregiver: husband Anticipated Caregiver's Contact Information: Sonia Side (h) [phone] (w) [phone] (c) [phone] Ability/Limitations of Caregiver: Husband can provide supervision at home for 1-2 weeks as needed (Husband works 6;30 am to 5 pm.) Caregiver Availability: 24/7 Discharge Plan Discussed with Primary Caregiver: Yes Is Caregiver In Agreement with Plan?: Yes Does Caregiver/Family have Issues with Lodging/Transportation while Pt is in Rehab?: No  Goals/Additional Needs Patient/Family Goal for Rehab: PT/OT S goals Expected length of stay: 7 days Cultural Considerations: None Dietary Needs: Regular diet with thin liquids Equipment Needs: TBD Special Service Needs: Uses crutches, does not use a prosthesis. Additional Information: Has 2 daughters.  One works as Corporate treasurer and the other dtr is in [place]. Pt/Family Agrees to Admission and willing to participate: Yes Program Orientation Provided & Reviewed with Pt/Caregiver Including Roles  & Responsibilities: Yes  Decrease burden of Care through IP rehab admission: N/A  Possible need for SNF placement upon discharge: Not planned  Patient Condition: This patient's condition remains as documented in the consult dated 07/29/13, in which the Rehabilitation Physician determined and documented that the patient's condition  is appropriate for intensive rehabilitative care in an inpatient rehabilitation facility. Will admit to inpatient rehab today.  Preadmission Screen Completed By:  Retta Diones, 07/30/2013 12:26 PM ______________________________________________________________________   Discussed status with Dr. Naaman Plummer on 07/30/13 at 1251 and received telephone approval for admission today.  Admission Coordinator:  Retta Diones, time1251/Date07/17/15

## 2013-07-30 NOTE — Progress Notes (Signed)
Physical Therapy Treatment Patient Details Name: [name] MRN: [id number] DOB: Mar 14, 1950 Today's Date: 08-27-2013    History of Present Illness L THR 05/28/13; R AKA - s/p ~40 YRS    PT Comments    Pt motivated and progressing slowly but steadily  Follow Up Recommendations  CIR     Equipment Recommendations  None recommended by PT    Recommendations for Other Services OT consult     Precautions / Restrictions Precautions Precautions: Fall Precaution Comments: R AKA - no prosthesis Restrictions Weight Bearing Restrictions: No Other Position/Activity Restrictions: WBAT    Mobility  Bed Mobility Overal bed mobility: Needs Assistance Bed Mobility: Supine to Sit     Supine to sit: Min guard     General bed mobility comments: Pt used rails and HOB was 30 degrees; pt stiff this am  Transfers Overall transfer level: Needs assistance Equipment used: Rolling walker (2 wheeled) Transfers: Sit to/from Stand Sit to Stand: Min assist;From elevated surface         General transfer comment: cues for hand placement  Ambulation/Gait Ambulation/Gait assistance: Min assist;Mod assist;+2 safety/equipment Ambulation Distance (Feet): 29 Feet (twice) Assistive device: Rolling walker (2 wheeled) Gait Pattern/deviations: Step-to pattern;Shuffle Gait velocity: decr   General Gait Details: cues for sequence, posture and position from RW: physical assist for balance and support    Stairs            Wheelchair Mobility    Modified Rankin (Stroke Patients Only)       Balance                                    Cognition Arousal/Alertness: Awake/alert Behavior During Therapy: WFL for tasks assessed/performed Overall Cognitive Status: Within Functional Limits for tasks assessed                      Exercises      General Comments        Pertinent Vitals/Pain 4/10; premed    Home Living                      Prior  Function            PT Goals (current goals can now be found in the care plan section) Acute Rehab PT Goals Patient Stated Goal: Rehab and home to walk without pain PT Goal Formulation: With patient Time For Goal Achievement: 08/04/13 Potential to Achieve Goals: Good Progress towards PT goals: Progressing toward goals    Frequency  7X/week    PT Plan Current plan remains appropriate    Co-evaluation             End of Session Equipment Utilized During Treatment: Gait belt Activity Tolerance: Patient tolerated treatment well;Patient limited by fatigue Patient left: in chair;with call bell/phone within reach;with family/visitor present     Time: 6301-6010 PT Time Calculation (min): 23 min  Charges:  $Gait Training: 23-37 mins                    G Codes:      Lashea Goda 08-27-13, 1:15 PM

## 2013-07-30 NOTE — Progress Notes (Signed)
Physical Therapy Treatment Patient Details Name: [name] MRN: [id number] DOB: Jul 28, 1950 Today's Date: August 28, 2013    History of Present Illness L THR 05/28/13; R AKA - s/p ~40 YRS    PT Comments      Follow Up Recommendations  CIR     Equipment Recommendations  None recommended by PT    Recommendations for Other Services OT consult     Precautions / Restrictions Precautions Precautions: Fall Precaution Comments: R AKA - no prosthesis Restrictions Weight Bearing Restrictions: No Other Position/Activity Restrictions: WBAT    Mobility  Bed Mobility Overal bed mobility: Needs Assistance Bed Mobility: Sit to Supine       Sit to supine: Min guard   General bed mobility comments: min cues for sequence  Transfers Overall transfer level: Needs assistance Equipment used: Rolling walker (2 wheeled) Transfers: Sit to/from Stand Sit to Stand: Min assist;From elevated surface         General transfer comment: cues for hand placement  Ambulation/Gait Ambulation/Gait assistance: Min assist Ambulation Distance (Feet): 28 Feet Assistive device: Rolling walker (2 wheeled) Gait Pattern/deviations: Step-to pattern;Decreased step length - right;Decreased step length - left;Shuffle Gait velocity: decr   General Gait Details: cues for sequence, posture and position from RW: physical assist for balance and support    Stairs            Wheelchair Mobility    Modified Rankin (Stroke Patients Only)       Balance                                    Cognition Arousal/Alertness: Awake/alert Behavior During Therapy: WFL for tasks assessed/performed Overall Cognitive Status: Within Functional Limits for tasks assessed                      Exercises Total Joint Exercises Ankle Circles/Pumps: AROM;Left;15 reps;Supine Quad Sets: AROM;Left;10 reps;Supine Gluteal Sets: AROM;Both;10 reps;Supine Heel Slides: AAROM;Left;Supine;20  reps Hip ABduction/ADduction: AAROM;Left;Supine;15 reps    General Comments        Pertinent Vitals/Pain     Home Living                      Prior Function            PT Goals (current goals can now be found in the care plan section) Acute Rehab PT Goals Patient Stated Goal: Rehab and home to walk without pain PT Goal Formulation: With patient Time For Goal Achievement: 08/04/13 Potential to Achieve Goals: Good Progress towards PT goals: Progressing toward goals    Frequency  7X/week    PT Plan Current plan remains appropriate    Co-evaluation             End of Session Equipment Utilized During Treatment: Gait belt Activity Tolerance: Patient tolerated treatment well Patient left: in bed;with call bell/phone within reach;with family/visitor present     Time: 1411-1436 PT Time Calculation (min): 25 min  Charges:  $Gait Training: 8-22 mins $Therapeutic Exercise: 8-22 mins                    G Codes:      Raaga Maeder August 28, 2013, 5:52 PM

## 2013-07-30 NOTE — Interval H&P Note (Signed)
[name] was admitted today to Inpatient Rehabilitation with the diagnosis of left hip OA s/p THA.  The patient's history has been reviewed, patient examined, and there is no change in status.  Patient continues to be appropriate for intensive inpatient rehabilitation.  I have reviewed the patient's chart and labs.  Questions were answered to the patient's satisfaction.  SWARTZ,ZACHARY T 07/30/2013, 8:20 PM

## 2013-07-31 ENCOUNTER — Inpatient Hospital Stay (HOSPITAL_COMMUNITY): Payer: BC Managed Care – PPO | Admitting: *Deleted

## 2013-07-31 ENCOUNTER — Inpatient Hospital Stay (HOSPITAL_COMMUNITY): Payer: BC Managed Care – PPO | Admitting: Physical Therapy

## 2013-07-31 DIAGNOSIS — G547 Phantom limb syndrome without pain: Secondary | ICD-10-CM

## 2013-07-31 DIAGNOSIS — IMO0001 Reserved for inherently not codable concepts without codable children: Secondary | ICD-10-CM

## 2013-07-31 DIAGNOSIS — F329 Major depressive disorder, single episode, unspecified: Secondary | ICD-10-CM

## 2013-07-31 DIAGNOSIS — R5381 Other malaise: Secondary | ICD-10-CM

## 2013-07-31 DIAGNOSIS — K2100 Gastro-esophageal reflux disease with esophagitis, without bleeding: Secondary | ICD-10-CM

## 2013-07-31 DIAGNOSIS — M167 Other unilateral secondary osteoarthritis of hip: Secondary | ICD-10-CM

## 2013-07-31 DIAGNOSIS — F3289 Other specified depressive episodes: Secondary | ICD-10-CM

## 2013-07-31 LAB — CBC WITH DIFFERENTIAL/PLATELET
Basophils Absolute: 0 10*3/uL (ref 0.0–0.1)
Basophils Relative: 0 % (ref 0–1)
Eosinophils Absolute: 0.2 10*3/uL (ref 0.0–0.7)
Eosinophils Relative: 1 % (ref 0–5)
HEMATOCRIT: 25.4 % — AB (ref 36.0–46.0)
HEMOGLOBIN: 8.3 g/dL — AB (ref 12.0–15.0)
LYMPHS ABS: 5.1 10*3/uL — AB (ref 0.7–4.0)
Lymphocytes Relative: 36 % (ref 12–46)
MCH: 32.2 pg (ref 26.0–34.0)
MCHC: 32.7 g/dL (ref 30.0–36.0)
MCV: 98.4 fL (ref 78.0–100.0)
MONOS PCT: 11 % (ref 3–12)
Monocytes Absolute: 1.5 10*3/uL — ABNORMAL HIGH (ref 0.1–1.0)
NEUTROS ABS: 7.5 10*3/uL (ref 1.7–7.7)
Neutrophils Relative %: 52 % (ref 43–77)
Platelets: 252 10*3/uL (ref 150–400)
RBC: 2.58 MIL/uL — AB (ref 3.87–5.11)
RDW: 13.5 % (ref 11.5–15.5)
WBC: 14.4 10*3/uL — ABNORMAL HIGH (ref 4.0–10.5)

## 2013-07-31 MED ORDER — HYDROMORPHONE HCL 2 MG PO TABS
1.0000 mg | ORAL_TABLET | Freq: Four times a day (QID) | ORAL | Status: DC | PRN
  Administered 2013-07-31 – 2013-08-05 (×7): 2 mg via ORAL
  Filled 2013-07-31 (×9): qty 1

## 2013-07-31 NOTE — Evaluation (Signed)
Physical Therapy Assessment and Plan  Patient Details  Name: [name] MRN: [id number] Date of Birth: May 11, 1950  PT Diagnosis: Difficulty walking, Edema, Muscle weakness and Pain in LLE Rehab Potential: Good ELOS: 7-9 days    Today's Date: 07/31/2013 Time: 3536-1443 Time Calculation (min): 45 min  Problem List:  Patient Active Problem List   Diagnosis Date Noted  . Osteoarthritis of hip 07/30/2013  . Postoperative anemia due to acute blood loss 07/29/2013  . Unspecified asthma(493.90) 07/29/2013  . Depression 07/29/2013  . Anxiety state, unspecified 07/29/2013  . History of Sarcoma, Bone 07/29/2013  . Phantom limb (syndrome) 07/29/2013  . Eczema 07/29/2013  . History of transfusion 07/29/2013  . Obstructive sleep apnea 07/29/2013  . OA (osteoarthritis) of hip 07/28/2013  . Chest pain 07/12/2013  . Chest pain of uncertain etiology 15/40/0867  . Fibromyalgia 07/12/2013  . GERD (gastroesophageal reflux disease) 07/12/2013  . Hiatal hernia 07/12/2013    Past Medical History:  Past Medical History  Diagnosis Date  . Complication of anesthesia     NAUSEA  . Chest pain     pt states due to esophageal problem -  primary care MD aware   . Hives     since pneumonia vaccine   . Asthma   . Depression   . Anxiety   . Fibromyalgia   . Spondylosis   . Bulging disc   . 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 Phantom limb pain     rt leg  . Leg pain, left   . Eczema     ears  . GERD (gastroesophageal reflux disease)     " alot of burping"  . Constipation, chronic   . Urgency incontinence   . History of transfusion   . Cancer     hx sarcoma rt knee with amputation  . Difficulty sleeping   . Sleep apnea     pt had recent sleep study done but does not know results - report called for and put on chart showing significant obstructive sleep apnea  . Hypertension    Past Surgical History:  Past Surgical History  Procedure Laterality Date  . Abdominal  hysterectomy  1980'S  . Sarcoma  1973    RT KNEE (aka)  . External ear surgery  AGE 41  . Total hip arthroplasty Left 07/28/2013    Procedure: LEFT TOTAL HIP ARTHROPLASTY ANTERIOR APPROACH;  Surgeon: [name], MD;  Location: [facility];  Service: Orthopedics;  Laterality: Left;    Assessment & Plan Clinical Impression: [name] is a 63 y.o. 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 She has failed conservative measures and elected to undergo L-THR by Dr. Wynelle Link on 07/28/13. Post op with leucocytosis as well as ABLA. She is WBAT and on Xarelto for DVT prophylaxis. Patient transferred to CIR on 07/30/2013 .   Patient currently requires min-mod with mobility secondary to muscle weakness and pain in LLE and decreased cardiorespiratoy endurance.  Prior to hospitalization, patient was modified independent  with mobility and lived with Spouse in a House home.  Home access is 1 step - pt reports step is difficult and was requiring assist from husband last few yearsStairs to enter.  Patient will benefit from skilled PT intervention to maximize safe functional mobility, minimize fall risk and decrease caregiver burden for planned discharge home with intermittent assist.  Anticipate patient will benefit from follow up [facility] at discharge.  PT - End of Session Activity Tolerance: Decreased this session;Tolerates 30+ min activity with multiple rests Endurance Deficit: Yes PT Assessment Rehab Potential: Good Barriers to Discharge: Inaccessible home environment;Decreased caregiver support PT Patient demonstrates impairments in the following area(s): Balance;Edema;Endurance;Pain;Safety PT Transfers Functional Problem(s): Bed Mobility;Bed to  Chair;Car;Furniture;Floor PT Locomotion Functional Problem(s): Ambulation;Wheelchair Mobility;Stairs PT Plan PT Intensity: Minimum of 1-2 x/day ,45 to 90 minutes PT Frequency: 5 out of 7 days PT Duration Estimated Length of Stay: 7-9 days  PT Treatment/Interventions: Ambulation/gait training;Balance/vestibular training;Discharge planning;DME/adaptive equipment instruction;Functional mobility training;Neuromuscular re-education;Pain management;Patient/family education;Stair training;Therapeutic Activities;Therapeutic Exercise;UE/LE Strength taining/ROM;UE/LE Coordination activities;Wheelchair propulsion/positioning PT Transfers Anticipated Outcome(s): mod I PT Locomotion Anticipated Outcome(s): mod I  PT Recommendation Follow Up Recommendations: Home health PT Patient destination: Home Equipment Recommended: Wheelchair cushion (measurements);Wheelchair (measurements);Rolling walker with 5" wheels  Skilled Therapeutic Intervention Therapeutic intervention initiated after completion of evaluation. Discussed falls risk, safety within room, and focus of therapy during stay. Discussed possible LOS, goals, and f/u therapy.  PT Evaluation Precautions/Restrictions Precautions Precautions: Fall Precaution Comments: R AKA - no prosthesis Restrictions Weight Bearing Restrictions: No Other Position/Activity Restrictions:  WBAT LLE General Chart Reviewed: Yes Family/Caregiver Present: No  Pain  Unrated pain in LLE with mobility, improves with repositioning/rest Home Living/Prior Functioning Home Living Available Help at Discharge: Other (Comment) (Husband plans to take off work for 2 weeks after d/c, then pt reports no assistance available) Type of Home: House Home Access: Stairs to enter [employer] of Steps: 1 step - pt reports step is difficult and was requiring assist from husband last few years Entrance Stairs-Rails: Right;None Home Layout: One level  Lives With: Spouse Prior  Function Level of Independence: Requires assistive device for independence;Independent with gait;Independent with transfers  Able to Take Stairs?: Yes Driving: No Cognition Overall Cognitive Status: Within Functional Limits for tasks assessed Orientation Level: Oriented X4 Sensation Sensation Light Touch: Appears Intact Stereognosis: Not tested Hot/Cold: Appears Intact Proprioception: Appears Intact Coordination Gross Motor Movements are Fluid and Coordinated: Yes Fine Motor Movements are Fluid and Coordinated: Yes Motor  Motor Motor: Within Functional Limits  Mobility Bed Mobility Bed Mobility: Supine to Sit Supine to Sit: 4: Min assist Supine to Sit Details (indicate cue type and reason): pt preferring to get out of L side of bed due to pain, perfomed supine > long sit then used UEs to bring LEs off edge of bed Transfers Transfers: Yes Sit to Stand: 4: Min assist;With upper extremity assist;From chair/3-in-1;From bed Sit to Stand Details: Verbal cues for technique;Verbal cues for precautions/safety Sit to Stand Details (indicate cue type and reason): pt prefers to push up with BUEs on RW despite vc's for safety, reports feeling more balanced with this both UEs on RW Stand to Sit: 4: Min assist Stand to Sit Details (indicate cue type and reason): Verbal cues for technique;Verbal cues for precautions/safety Stand to Sit Details: pt encouraged to reach back with 1 UE for safety, prefers to keep both UEs on RW Locomotion  Ambulation Ambulation: Yes Ambulation/Gait Assistance: 4: Min assist Ambulation Distance (Feet): 30 Feet Assistive device: Rolling walker Gait Gait: Yes Gait Pattern: Impaired Gait Pattern: Decreased step length - left;Shuffle (hop-to) Gait velocity: decreased Stairs / Additional Locomotion Stairs: Yes Stairs Assistance: 4: Min assist;3: Mod assist Stairs Assistance Details:  Verbal cues for technique;Verbal cues for precautions/safety Stair Management  Technique: Two rails Number of Stairs: 5 Height of Stairs: 6 Wheelchair Mobility Wheelchair Mobility: Yes Wheelchair Assistance: 5: Careers information officer: Both upper extremities Wheelchair Parts Management: Needs assistance Distance: 150 ft  Trunk/Postural Assessment  Cervical Assessment Cervical Assessment: Within Functional Limits Thoracic Assessment Thoracic Assessment: Within Functional Limits Lumbar Assessment Lumbar Assessment: Within Functional Limits Postural Control Postural Control: Within Functional Limits  Balance Balance Balance Assessed: Yes Dynamic Sitting Balance Dynamic Sitting - Balance Support: Feet supported;During functional activity Dynamic Sitting - Level of Assistance: 6: Modified independent (Device/Increase time) Static Standing Balance Static Standing - Balance Support: Bilateral upper extremity supported Static Standing - Level of Assistance: 5: Stand by assistance Extremity Assessment  RUE Assessment RUE Assessment: Within Functional Limits LUE Assessment LUE Assessment: Within Functional Limits RLE Assessment RLE Assessment: Not tested LLE Assessment LLE Assessment: Exceptions to [facility] LLE Strength LLE Overall Strength: Due to pain;Deficits LLE Overall Strength Comments: hip NT due to pain, knee ext/flex and ankle DF/PF [facility]   FIM:  FIM - Control and instrumentation engineer Devices: Walker;Arm rests Bed/Chair Transfer: 4: Supine > Sit: Min A (steadying Pt. > 75%/lift 1 leg);4: Bed > Chair or W/C: Min A (steadying Pt. > 75%) FIM - Locomotion: Wheelchair Distance: 150 ft Locomotion: Wheelchair: 5: Travels 150 ft or more: maneuvers on rugs and over door sills with supervision, cueing or coaxing FIM - Locomotion: Ambulation Locomotion: Ambulation Assistive Devices: Administrator Ambulation/Gait Assistance: 4: Min assist Locomotion: Ambulation: 1: Travels less than 50 ft with minimal assistance (Pt.>75%) FIM -  Locomotion: Stairs Locomotion: Scientist, physiological: Hand rail - 2 Locomotion: Stairs: 2: Up and Down 4 - 11 stairs with moderate assistance (Pt: 50 - 74%)   Refer to Care Plan for Long Term Goals  Recommendations for other services: None  Discharge Criteria: Patient will be discharged from PT if patient refuses treatment 3 consecutive times without medical reason, if treatment goals not met, if there is a change in medical status, if patient makes no progress towards goals or if patient is discharged from hospital.  The above assessment, treatment plan, treatment alternatives and goals were discussed and mutually agreed upon: by patient  Laretta Alstrom 07/31/2013, 1:00 PM

## 2013-07-31 NOTE — Progress Notes (Signed)
[name] is a 63 y.o. female 01-04-1951 [id number]  Subjective: C/o R hip pain (oxycodone "makes walls move", hydrocodone desn't work, her psychiatrist did not want her to take Tramadol). No new problems. Slept well. Feeling OK.  Objective: Vital signs in last 24 hours: Temp:  [98.2 F (36.8 C)-99.3 F (37.4 C)] 98.5 F (36.9 C) (07/18 0606) Pulse Rate:  [86-89] 86 (07/18 0606) Resp:  [18-20] 19 (07/18 0606) BP: (101-122)/(44-67) 111/44 mmHg (07/18 0606) SpO2:  [97 %-98 %] 97 % (07/18 0606) Weight:  [159 lb 9.8 oz (72.4 kg)] 159 lb 9.8 oz (72.4 kg) (07/17 1720) Weight change:  Last BM Date: 07/26/13  Intake/Output from previous day: 07/17 0701 - 07/18 0700 In: 240 [P.O.:240] Out: -  Last cbgs: CBG (last 3)   Recent Labs  07/28/13 1258  GLUCAP 149*     Physical Exam General: No apparent distress   HEENT: not dry Lungs: Normal effort. Lungs clear to auscultation, no crackles or wheezes. Cardiovascular: Regular rate and rhythm, no edema Abdomen: S/NT/ND; BS(+) Musculoskeletal:  unchanged Neurological: No new neurological deficits Wounds: dressed L hip RLE:AKA  Skin: clear  Aging changes Mental state: Alert, oriented, cooperative    Lab Results: BMET    Component Value Date/Time   NA 142 07/30/2013 0502   K 3.9 07/30/2013 0502   CL 106 07/30/2013 0502   CO2 25 07/30/2013 0502   GLUCOSE 135* 07/30/2013 0502   BUN 15 07/30/2013 0502   CREATININE 0.84 07/30/2013 0502   CALCIUM 9.1 07/30/2013 0502   GFRNONAA 73* 07/30/2013 0502   GFRAA 85* 07/30/2013 0502   CBC    Component Value Date/Time   WBC 14.4* 07/31/2013 0528   RBC 2.58* 07/31/2013 0528   HGB 8.3* 07/31/2013 0528   HCT 25.4* 07/31/2013 0528   PLT 252 07/31/2013 0528   MCV 98.4 07/31/2013 0528   MCH 32.2 07/31/2013 0528   MCHC 32.7 07/31/2013 0528   RDW 13.5 07/31/2013 0528   LYMPHSABS 5.1* 07/31/2013 0528   MONOABS 1.5* 07/31/2013 0528   EOSABS 0.2 07/31/2013 0528   BASOSABS 0.0 07/31/2013 0528     Studies/Results: No results found.  Medications: I have reviewed the patient's current medications.  Assessment/Plan:  1. 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 DVT Prophylaxis/Anticoagulation: Pharmaceutical: Xarelto  3. Pain Management: tylenol, robaxin, ice , and heat. She is unable to tolerate narcotics  -self reported hx of fibromyalgia. Discussed appropriate technique, posture.  - will try Dilaudid - helped in the past at low dose.  (oxycodone "makes walls move", hydrocodone desn't work, her psychiatrist did not want her to take Tramadol) 4. Mood: patient is motivated and in good spirits  -maintain buspar and celexa, hs doxepin at current doses.  5. Neuropsych: This patient is capable of making decisions on her own behalf.  6. ABLA: iron supplementation  7. Leukocytosis: Low grade temp today. Wound is clean and lungs appear  -check ua and culture on admit  -consider cxr if symptoms dictate     Length of stay, days: 1  Walker Kehr , MD 07/31/2013, 9:03 AM

## 2013-07-31 NOTE — Progress Notes (Signed)
Physical Therapy Session Note  Patient Details  Name: [name] MRN: [id number] Date of Birth: 11/13/1950  Today's Date: 07/31/2013 Time: 1315-1400 Time Calculation (min): 45 min  Short Term Goals: Week 1:  PT Short Term Goal 1 (Week 1): = LTGs due to anticipated LOS  Skilled Therapeutic Interventions/Progress Updates:  Pt received sitting in w/c, agreeable to therapy. W/c propulsion 2 x 150 ft using BUEs and S. Pt performed stand pivot transfer w/c <> mat using RW with min A. Sit <> supine with close supervision-min A. Pt performed LLE therex for strengthening/ROM in supine, 20 reps each: ankle pumps, glute sets with 3-4 sec hold, heel slides and hip abd/add with maxislide, SAQ with bolster. Pt requires verbal cues to utilize LE vs using UEs to assist LLE with bed mobility. Pt left sitting in w/c, handoff to OT.   Therapy Documentation Precautions:  Precautions Precautions: Fall Precaution Comments: R AKA - no prosthesis Restrictions Weight Bearing Restrictions: No Other Position/Activity Restrictions:  WBAT LLE Pain:  Denied pain  See FIM for current functional status  Therapy/Group: Individual Therapy  Laretta Alstrom 07/31/2013, 2:02 PM

## 2013-07-31 NOTE — Evaluation (Signed)
Occupational Therapy Assessment and Plan  Patient Details  Name: [name] MRN: [id number] Date of Birth: 1950/04/26  OT Diagnosis: muscle weakness (generalized) and pain in joint Rehab Potential: Rehab Potential: Good ELOS: 7-9 days    Today's Date: 07/31/2013 Time: 1010-1120  1st session Time Calculation (min): 70 min   Today's Date: 07/31/2013 Time: 1400-1430  2nd session Time Calculation (min): 30 min  Problem List:  Patient Active Problem List   Diagnosis Date Noted  . Osteoarthritis of hip 07/30/2013  . Postoperative anemia due to acute blood loss 07/29/2013  . Unspecified asthma(493.90) 07/29/2013  . Depression 07/29/2013  . Anxiety state, unspecified 07/29/2013  . History of Sarcoma, Bone 07/29/2013  . Phantom limb (syndrome) 07/29/2013  . Eczema 07/29/2013  . History of transfusion 07/29/2013  . Obstructive sleep apnea 07/29/2013  . OA (osteoarthritis) of hip 07/28/2013  . Chest pain 07/12/2013  . Chest pain of uncertain etiology 26/71/2458  . Fibromyalgia 07/12/2013  . GERD (gastroesophageal reflux disease) 07/12/2013  . Hiatal hernia 07/12/2013    Past Medical History:  Past Medical History  Diagnosis Date  . Complication of anesthesia     NAUSEA  . Chest pain     pt states due to esophageal problem -  primary care MD aware   . Hives     since pneumonia vaccine   . Asthma   . Depression   . Anxiety   . Fibromyalgia   . Spondylosis   . Bulging disc   . 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 Phantom limb pain     rt leg  . Leg pain, left   . Eczema     ears  . GERD (gastroesophageal reflux disease)     " alot of burping"  . Constipation, chronic   . Urgency incontinence   . History of transfusion   . Cancer     hx sarcoma rt knee with amputation  . Difficulty sleeping   . Sleep apnea     pt had recent sleep study done but does not know results - report called for and put on chart showing significant obstructive sleep apnea   . Hypertension    Past Surgical History:  Past Surgical History  Procedure Laterality Date  . Abdominal hysterectomy  1980'S  . Sarcoma  1973    RT KNEE (aka)  . External ear surgery  AGE 52  . Total hip arthroplasty Left 07/28/2013    Procedure: LEFT TOTAL HIP ARTHROPLASTY ANTERIOR APPROACH;  Surgeon: Alejandra Alf, MD;  Location: [facility];  Service: Orthopedics;  Laterality: Left;    Assessment & Plan HPI: [name] is a 62 y.o. 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 She has failed conservative measures and elected to undergo L-THR by Dr. Wynelle Barnes on 07/28/13. Post op with leucocytosis as well as ABLA. She is WBAT and on Xarelto for DVT prophylaxis.  Pt. Transferred to CIR on 07/30/13.  Patient currently requires min with basic self-care skills secondary to muscle weakness and muscle joint tightness.  Prior to hospitalization, patient could complete BADL with modified independent .  Patient will benefit from skilled intervention to decrease level of assist with basic self-care skills,  increase independence with basic self-care skills and increase level of independence with iADL prior to discharge home with care partner.  Anticipate patient will require intermittent supervision and follow up home health.  OT - End of Session Activity Tolerance: Tolerates < 10 min activity, no significant change in vital signs Endurance Deficit: Yes Endurance Deficit Description:  (SOB with minimal exertion) OT Assessment Rehab Potential: Good Barriers to Discharge:  (has one step to enter home with no bannisters) OT Patient demonstrates impairments in the following area(s): Balance;Edema;Endurance;Pain;Safety OT Basic ADL's Functional Problem(s):  Grooming;Bathing;Dressing;Toileting OT Advanced ADL's Functional Problem(s): Simple Meal Preparation OT Transfers Functional Problem(s): Toilet;Tub/Shower OT Additional Impairment(s): None OT Plan OT Intensity: Minimum of 1-2 x/day, 45 to 90 minutes OT Frequency: 5 out of 7 days OT Duration/Estimated Length of Stay: 7-9 days OT Treatment/Interventions: Medical illustrator training;Community reintegration;Discharge planning;DME/adaptive equipment instruction;Functional mobility training;Patient/family education;Pain management;Self Care/advanced ADL retraining;Therapeutic Activities;Therapeutic Exercise;UE/LE Strength taining/ROM;Wheelchair propulsion/positioning OT Self Feeding Anticipated Outcome(s): independent OT Basic Self-Care Anticipated Outcome(s): mod I OT Toileting Anticipated Outcome(s): mod I OT Bathroom Transfers Anticipated Outcome(s): mod I OT Recommendation Patient destination: Home Follow Up Recommendations: Home health OT Equipment Recommended: Tub/shower bench Equipment Details: tub shower combo at home   Skilled Therapeutic Intervention 2nd session:  Addressed tub transfer using tub bench.  Pt. Ambulated with RW to bathroom with min assist.  Transferred to tub bench with minimal assist using grab bars.  Pt needs tub transfer bench and grab bars for home safety when taking a shower. Pt. Expressed goals of engaging in meal prep, simple house duties, and being able to get in/out of the motorized cart at the grocery store.    OT Evaluation Precautions/Restrictions  WBAT:  Anterior approach to THR Precautions Precautions: Fall Precaution Comments: R AKA - no prosthesis Restrictions Weight Bearing Restrictions: No Other Position/Activity Restrictions:  WBAT LLE       Pain:  3/10  1st and 2nd session   Home Living/Prior Functioning Home Living Available Help at Discharge:  (husband available for 2 wks after discharge) Type of Home: House Home Access: Stairs to  enter [employer] of Steps: 1 step - pt reports step is difficult and was requiring assist from husband last few years Entrance Stairs-Rails: None (carport to back door, 1 step to enter) Home Layout: One level  Lives With: Spouse IADL History Homemaking Responsibilities: Yes Meal Prep Responsibility: Secondary Laundry Responsibility: No Cleaning Responsibility: Secondary (cleans sinks; cleans counters,straightens house, dust) Bill Paying/Finance Responsibility: Secondary Shopping Responsibility: Secondary Child Care Responsibility: No Homemaking Comments:  (shops with husand, fixes simple meals; husband does set up) Current License: No Mode of Transportation: Musician Occupation: Unemployed Leisure and Hobbies:  (movies,tv,crochet. bible study 1x wk) Prior Function Level of Independence: Independent with basic ADLs;Independent with gait;Needs assistance with homemaking;Needs assistance with gait (used crutches) Laundry: Maximal  Able to Take Stairs?: Yes Driving: Yes Leisure: Hobbies-yes (Comment)    Vision/Perception  Vision- Assessment Eye Alignment: Within Functional Limits Perception Perception: Within Functional Limits  Cognition Overall Cognitive Status: Within Functional Limits for tasks assessed Sensation Sensation Light Touch: Appears Intact Stereognosis: Appears Intact Coordination Gross Motor Movements are Fluid and Coordinated: Yes Fine Motor Movements are Fluid and Coordinated: Yes Motor  Motor Motor: Within Functional Limits Mobility  Bed Mobility Bed Mobility: Supine to Sit Supine to Sit: 4: Min assist Supine to Sit Details: Tactile cues for placement Transfers Sit to Stand: 4: Min assist Sit to Stand Details: Verbal cues for technique;Verbal cues for precautions/safety Stand to Sit:  4: Min assist  Trunk/Postural Assessment  Cervical Assessment Cervical Assessment: Within Functional Limits Thoracic Assessment Thoracic Assessment: Within  Functional Limits Lumbar Assessment Lumbar Assessment: Within Functional Limits Postural Control Postural Control: Within Functional Limits  Balance Dynamic Sitting Balance Dynamic Sitting - Balance Support: Feet supported;During functional activity Dynamic Sitting - Level of Assistance: 6: Modified independent (Device/Increase time) Static Standing Balance Static Standing - Balance Support: Bilateral upper extremity supported Static Standing - Level of Assistance: 5: Stand by assistance Extremity/Trunk Assessment RUE Assessment RUE Assessment: Within Functional Limits LUE Assessment LUE Assessment: Within Functional Limits    Refer to Care Plan for Long Term Goals  Recommendations for other services: None  Discharge Criteria: Patient will be discharged from OT if patient refuses treatment 3 consecutive times without medical reason, if treatment goals not met, if there is a change in medical status, if patient makes no progress towards goals or if patient is discharged from hospital.  The above assessment, treatment plan, treatment alternatives and goals were discussed and mutually agreed upon: by patient and by family  Lisa Roca 07/31/2013, 6:46 PM

## 2013-08-01 ENCOUNTER — Inpatient Hospital Stay (HOSPITAL_COMMUNITY): Payer: BC Managed Care – PPO | Admitting: Occupational Therapy

## 2013-08-01 DIAGNOSIS — J45909 Unspecified asthma, uncomplicated: Secondary | ICD-10-CM

## 2013-08-01 DIAGNOSIS — F411 Generalized anxiety disorder: Secondary | ICD-10-CM

## 2013-08-01 LAB — URINE CULTURE

## 2013-08-01 NOTE — Progress Notes (Addendum)
Occupational Therapy Session Note  Patient Details  Name: [name] MRN: [id number] Date of Birth: September 01, 1950  Today's Date: 08/01/2013  OT Short Term Goal 1 (Week 1): STG=LTG  First Session: Time: 0930-1020 Time Calculation (min): 50 min ADL in w/c at sink with good balance for standing to peribathe and pull up pants (close S).  Pt was able to wash and dress left foot by bring L LE up closer to trunk.   Though eval notes indicate patient is simply L LE WBAT and no precautions.   Patient stated she was told by MD before coming to this unit not to internally rotate.   May need to clarify with rehab doc  Second Session 12:45-1:05 20 minutes :  Side step tub/shower transfer via tub transfer bench = supervision.  Patient stated she will probably have to side step from entry of bathroom to tub transfer bench due to decrease space in bathroom.  Husband present for session and is a Development worker, community and states he will install grab bars and that he will help wife as needed at home...   Therapy Documentation Precautions:  Precautions Precautions: Fall Precaution Comments: R AKA - no prosthesis Restrictions Weight Bearing Restrictions: No Other Position/Activity Restrictions:  WBAT LLE    Pain:denied  See FIM for current functional status  Therapy/Group: Individual Therapy  Alfredia Ferguson [facility] 08/01/2013, 1:58 PM

## 2013-08-01 NOTE — Progress Notes (Signed)
Pt refused CPAP. Pt knows she can call RT if she changes her mind. RT will continue to monitor.

## 2013-08-01 NOTE — Progress Notes (Signed)
[name] is a 63 y.o. female October 10, 1950 [id number]  Subjective: C/o R hip pain (oxycodone "makes walls move", hydrocodone desn't work, her psychiatrist did not want her to take Tramadol). Dilaudid at low dose is working well... No new problems. Slept well. Feeling OK.  Objective: Vital signs in last 24 hours: Temp:  [98.1 F (36.7 C)-99.5 F (37.5 C)] 98.1 F (36.7 C) (07/19 0623) Pulse Rate:  [72-100] 82 (07/19 0623) Resp:  [17-18] 17 (07/19 0623) BP: (99-124)/(62-74) 99/62 mmHg (07/19 0623) SpO2:  [94 %-97 %] 95 % (07/19 8338) Weight change:  Last BM Date: 07/31/13  Intake/Output from previous day: 07/18 0701 - 07/19 0700 In: 1080 [P.O.:1080] Out: -  Last cbgs: CBG (last 3)  No results found for this basename: GLUCAP,  in the last 72 hours   Physical Exam General: No apparent distress   HEENT: not dry Lungs: Normal effort. Lungs clear to auscultation, no crackles or wheezes. Cardiovascular: Regular rate and rhythm, no edema Abdomen: S/NT/ND; BS(+) Musculoskeletal:  unchanged Neurological: No new neurological deficits Wounds: dressed L hip RLE:AKA  Skin: clear  Aging changes Mental state: Alert, oriented, cooperative    Lab Results: BMET    Component Value Date/Time   NA 142 07/30/2013 0502   K 3.9 07/30/2013 0502   CL 106 07/30/2013 0502   CO2 25 07/30/2013 0502   GLUCOSE 135* 07/30/2013 0502   BUN 15 07/30/2013 0502   CREATININE 0.84 07/30/2013 0502   CALCIUM 9.1 07/30/2013 0502   GFRNONAA 73* 07/30/2013 0502   GFRAA 85* 07/30/2013 0502   CBC    Component Value Date/Time   WBC 14.4* 07/31/2013 0528   RBC 2.58* 07/31/2013 0528   HGB 8.3* 07/31/2013 0528   HCT 25.4* 07/31/2013 0528   PLT 252 07/31/2013 0528   MCV 98.4 07/31/2013 0528   MCH 32.2 07/31/2013 0528   MCHC 32.7 07/31/2013 0528   RDW 13.5 07/31/2013 0528   LYMPHSABS 5.1* 07/31/2013 0528   MONOABS 1.5* 07/31/2013 0528   EOSABS 0.2 07/31/2013 0528   BASOSABS 0.0 07/31/2013 0528     Studies/Results: No results found.  Medications: I have reviewed the patient's current medications.  Assessment/Plan:  1. 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 DVT Prophylaxis/Anticoagulation: Pharmaceutical: Xarelto  3. Pain Management: tylenol, robaxin, ice , and heat. She is unable to tolerate narcotics  -self reported hx of fibromyalgia. Discussed appropriate technique, posture.  - Dilaudid at low dose is working well...  (oxycodone "makes walls move", hydrocodone desn't work, her psychiatrist did not want her to take Tramadol) 4. Mood: patient is motivated and in good spirits  -maintain buspar and celexa, hs doxepin at current doses.  5. Neuropsych: This patient is capable of making decisions on her own behalf.  6. ABLA: iron supplementation  7. Leukocytosis: Low grade temp today. Wound is clean and lungs appear  -check ua and culture on admit  -consider cxr if symptoms dictate  Cont current Rx     Length of stay, days: 2  Walker Kehr , MD 08/01/2013, 9:11 AM

## 2013-08-02 ENCOUNTER — Inpatient Hospital Stay (HOSPITAL_COMMUNITY): Payer: BC Managed Care – PPO

## 2013-08-02 LAB — COMPREHENSIVE METABOLIC PANEL
ALBUMIN: 2.7 g/dL — AB (ref 3.5–5.2)
ALT: 20 U/L (ref 0–35)
AST: 22 U/L (ref 0–37)
Alkaline Phosphatase: 62 U/L (ref 39–117)
Anion gap: 10 (ref 5–15)
BILIRUBIN TOTAL: 0.3 mg/dL (ref 0.3–1.2)
BUN: 17 mg/dL (ref 6–23)
CHLORIDE: 106 meq/L (ref 96–112)
CO2: 28 mEq/L (ref 19–32)
CREATININE: 0.87 mg/dL (ref 0.50–1.10)
Calcium: 8.7 mg/dL (ref 8.4–10.5)
GFR calc Af Amer: 81 mL/min — ABNORMAL LOW (ref >90)
GFR calc non Af Amer: 70 mL/min — ABNORMAL LOW (ref >90)
Glucose, Bld: 112 mg/dL — ABNORMAL HIGH (ref 70–99)
Potassium: 4.4 mEq/L (ref 3.7–5.3)
Sodium: 144 mEq/L (ref 137–147)
TOTAL PROTEIN: 6.1 g/dL (ref 6.0–8.3)

## 2013-08-02 NOTE — Progress Notes (Signed)
Patient information reviewed and entered into eRehab system by Latoia Eyster, RN, CRRN, PPS Coordinator.  Information including medical coding and functional independence measure will be reviewed and updated through discharge.    

## 2013-08-02 NOTE — IPOC Note (Addendum)
Overall Plan of Care [facility]) Patient Details Name: [name] MRN: [id number] DOB: 1950/06/14  Admitting Diagnosis: R THR L AKA  Hospital Problems: Active Problems:   Osteoarthritis of hip     Functional Problem List: Nursing Bladder;Bowel;Medication Management;Pain;Safety;Skin Integrity  PT Balance;Edema;Endurance;Pain;Safety  OT Balance;Edema;Endurance;Pain;Safety  SLP    TR Activity tolerance, functional mobility, balance, safety, pain, anxiety/stress         Basic ADL's: OT Grooming;Bathing;Dressing;Toileting     Advanced  ADL's: OT Simple Meal Preparation     Transfers: PT Bed Mobility;Bed to Chair;Car;Furniture;Floor  OT Toilet;Tub/Shower     Locomotion: PT Ambulation;Wheelchair Mobility;Stairs     Additional Impairments: OT None  SLP        TR      Anticipated Outcomes Item Anticipated Outcome  Self Feeding independent  Swallowing      Basic self-care  mod I  Toileting  mod I   Bathroom Transfers mod I  Bowel/Bladder  To be continent to bladder and bowel  Transfers  mod I  Locomotion  mod I   Communication     Cognition     Pain  Pain level 2 on scale 1 to 10  Safety/Judgment  Pt. will be free from falls.   Therapy Plan: PT Intensity: Minimum of 1-2 x/day ,45 to 90 minutes PT Frequency: 5 out of 7 days PT Duration Estimated Length of Stay: 7-9 days  OT Intensity: Minimum of 1-2 x/day, 45 to 90 minutes OT Frequency: 5 out of 7 days OT Duration/Estimated Length of Stay: 7-9 days TR Duration/ELOS: 7 Days TR Frequency:  Min 1 time per week >20 minutes           Team Interventions: Nursing Interventions Patient/Family Education;Bladder Management;Bowel Management;Disease Management/Prevention;Pain Management;Medication Management;Skin Care/Wound Management  PT interventions Ambulation/gait training;Balance/vestibular training;Discharge planning;DME/adaptive equipment instruction;Functional mobility training;Neuromuscular  re-education;Pain management;Patient/family education;Stair training;Therapeutic Activities;Therapeutic Exercise;UE/LE Strength taining/ROM;UE/LE Coordination activities;Wheelchair propulsion/positioning  OT Interventions Balance/vestibular training;Community reintegration;Discharge planning;DME/adaptive equipment instruction;Functional mobility training;Patient/family education;Pain management;Self Care/advanced ADL retraining;Therapeutic Activities;Therapeutic Exercise;UE/LE Strength taining/ROM;Wheelchair propulsion/positioning  SLP Interventions    TR Interventions Recreation/leisure participation, Balance/Vestibular training, functional mobility, therapeutic activities, UE/LE strength/coordination, w/c mobility, community reintegration, pt/family education, adaptive equipment instruction/use, discharge planning, psychosocial support  SW/CM Interventions      Team Discharge Planning: Destination: PT-Home ,OT- Home , SLP-  Projected Follow-up: PT-Home health PT, OT-  Home health OT, SLP-  Projected Equipment Needs: PT-Wheelchair cushion (measurements);Wheelchair (measurements);Rolling walker with 5" wheels, OT- Tub/shower bench, SLP-  Equipment Details: PT- , OT-tub shower combo at home Patient/family involved in discharge planning: PT- Patient,  OT-Patient, SLP-   MD ELOS: 5-7 days  Medical Rehab Prognosis:  Good Assessment: 63 y.o. 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 She has failed conservative measures and elected to undergo L-THR by Dr. Wynelle Link on 07/28/13. Post op with leucocytosis as well as ABLA. She is WBAT and on Xarelto for DVT prophylaxis   Now requiring 24/7 Rehab RN,MD, as well as CIR level PT, OT.  Treatment team will focus on ADLs  and mobility, balance with goals set at Mod I   See Team Conference Notes for weekly updates to the plan of care

## 2013-08-02 NOTE — Progress Notes (Signed)
[facility] PHYSICAL MEDICINE & REHABILITATION     PROGRESS NOTE    Subjective/Complaints: Had a pretty good weekend. Enjoyed therapies.   Objective: Vital Signs: Blood pressure 94/53, pulse 86, temperature 98.2 F (36.8 C), temperature source Oral, resp. rate 18, height 5\' 2"  (1.575 m), weight 72.4 kg (159 lb 9.8 oz), SpO2 97.00%. No results found.  Recent Labs  07/31/13 0528  WBC 14.4*  HGB 8.3*  HCT 25.4*  PLT 252    Recent Labs  08/02/13 0620  NA 144  K 4.4  CL 106  GLUCOSE 112*  BUN 17  CREATININE 0.87  CALCIUM 8.7   CBG (last 3)  No results found for this basename: GLUCAP,  in the last 72 hours  Wt Readings from Last 3 Encounters:  07/30/13 72.4 kg (159 lb 9.8 oz)  07/28/13 69.854 kg (154 lb)  07/28/13 69.854 kg (154 lb)    Physical Exam:  Constitutional: She is oriented to person, place, and time. She appears well-developed and well-nourished.  HENT: oral mucosa pink and moist, dentition good  Head: Normocephalic and atraumatic.  Eyes: Conjunctivae are normal. Pupils are equal, round, and reactive to light.  Neck: Normal range of motion. Neck supple.  Cardiovascular: Normal rate and regular rhythm. No murmurs  Respiratory: Effort normal and breath sounds normal. No respiratory distress. She has no wheezes.  GI: Soft. Bowel sounds are normal. She exhibits no distension. There is no tenderness.  Musc: mild to moderate edema about the left thigh. Right AK stump with soft tissue/fat. Very short Neurological: She is alert and oriented to person, place, and time. No cranial nerve deficit. Motor 5/5 in BUE. 2 Left hip flex, Knee ext, 4/5 Left ankle DF/PF. Right HF 4+/5.  Skin: Skin is warm and dry. Anterior hip incision is clean and dry with steristrips. Psychiatric: Her speech is normal and behavior is normal. Thought content normal. Her mood appears normal. Cognition and memory are normal.      Assessment/Plan: 1. Functional deficits secondary to OA left  hip s/p right THA which require 3+ hours per day of interdisciplinary therapy in a comprehensive inpatient rehab setting. Physiatrist is providing close team supervision and 24 hour management of active medical problems listed below. Physiatrist and rehab team continue to assess barriers to discharge/monitor patient progress toward functional and medical goals. FIM: FIM - Bathing Bathing Steps Patient Completed: Chest;Right upper leg;Left upper leg;Right Arm;Left Arm;Abdomen;Front perineal area;Buttocks Bathing: 4: Min-Patient completes 8-9 2f 10 parts or 75+ percent  FIM - Upper Body Dressing/Undressing Upper body dressing/undressing steps patient completed: Thread/unthread right bra strap;Thread/unthread left bra strap;Hook/unhook bra;Thread/unthread left sleeve of pullover shirt/dress;Pull shirt over trunk;Thread/unthread right sleeve of pullover shirt/dresss;Put head through opening of pull over shirt/dress Upper body dressing/undressing: 5: Set-up assist to: Obtain clothing/put away FIM - Lower Body Dressing/Undressing Lower body dressing/undressing steps patient completed: Thread/unthread right underwear leg;Thread/unthread left underwear leg;Pull underwear up/down;Thread/unthread right pants leg;Thread/unthread left pants leg;Pull pants up/down;Fasten/unfasten pants Lower body dressing/undressing: 5: Set-up assist to: Obtain clothing  FIM - Toileting Toileting: 0: Activity did not occur  FIM - Air cabin crew Transfers: 0-Activity did not occur  FIM - Control and instrumentation engineer Devices: Bed rails Bed/Chair Transfer: 4: Bed > Chair or W/C: Min A (steadying Pt. > 75%);6: Supine > Sit: No assist  FIM - Locomotion: Wheelchair Distance: 150 ft Locomotion: Wheelchair: 5: Travels 150 ft or more: maneuvers on rugs and over door sills with supervision, cueing or coaxing FIM - Locomotion: Ambulation  Locomotion: Ambulation Assistive Devices: Astronomer Ambulation/Gait Assistance: 4: Min assist Locomotion: Ambulation: 1: Travels less than 50 ft with minimal assistance (Pt.>75%)  Comprehension Comprehension Mode: Auditory Comprehension: 7-Follows complex conversation/direction: With no assist  Expression Expression Mode: Verbal Expression: 7-Expresses complex ideas: With no assist  Social Interaction Social Interaction: 7-Interacts appropriately with others - No medications needed.  Problem Solving Problem Solving: 5-Solves complex 90% of the time/cues < 10% of the time  Memory Memory: 6-More than reasonable amt of time Medical Problem List and Plan:  1. 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 DVT Prophylaxis/Anticoagulation: Pharmaceutical: Xarelto  3. Pain Management: tylenol, robaxin, ice , and heat. She is unable to tolerate narcotics  -self reported hx of fibromyalgia.    4. Mood: patient is motivated and in good spirits  -maintain buspar and celexa, hs doxepin at current doses.  5. Neuropsych: This patient is capable of making decisions on her own behalf.  6. ABLA: iron supplementation, hgb recovering 7. Leukocytosis: temp resolved, wbc's actually decreased today -ua neg, culture neg, wound, remainder of exam benign    LOS (Days) 3 A FACE TO FACE EVALUATION WAS PERFORMED  SWARTZ,ZACHARY T 08/02/2013 8:08 AM

## 2013-08-02 NOTE — Progress Notes (Signed)
Social Work  Social Work Assessment and Plan  Patient Details  Name: [name] MRN: [id number] Date of Birth: 1950-08-15  Today's Date: 08/02/2013  Problem List:  Patient Active Problem List   Diagnosis Date Noted  . Osteoarthritis of hip 07/30/2013  . Postoperative anemia due to acute blood loss 07/29/2013  . Unspecified asthma(493.90) 07/29/2013  . Depression 07/29/2013  . Anxiety state, unspecified 07/29/2013  . History of Sarcoma, Bone 07/29/2013  . Phantom limb (syndrome) 07/29/2013  . Eczema 07/29/2013  . History of transfusion 07/29/2013  . Obstructive sleep apnea 07/29/2013  . OA (osteoarthritis) of hip 07/28/2013  . Chest pain 07/12/2013  . Chest pain of uncertain etiology 61/95/0932  . Fibromyalgia 07/12/2013  . GERD (gastroesophageal reflux disease) 07/12/2013  . Hiatal hernia 07/12/2013   Past Medical History:  Past Medical History  Diagnosis Date  . Complication of anesthesia     NAUSEA  . Chest pain     pt states due to esophageal problem -  primary care MD aware   . Hives     since pneumonia vaccine   . Asthma   . Depression   . Anxiety   . Fibromyalgia   . Spondylosis   . Bulging disc   . 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 Phantom limb pain     rt leg  . Leg pain, left   . Eczema     ears  . GERD (gastroesophageal reflux disease)     " alot of burping"  . Constipation, chronic   . Urgency incontinence   . History of transfusion   . Cancer     hx sarcoma rt knee with amputation  . Difficulty sleeping   . Sleep apnea     pt had recent sleep study done but does not know results - report called for and put on chart showing significant obstructive sleep apnea  . Hypertension    Past Surgical History:  Past Surgical History  Procedure Laterality Date  . Abdominal hysterectomy  1980'S  . Sarcoma  1973    RT KNEE (aka)  . External ear surgery  AGE 59  . Total hip arthroplasty Left 07/28/2013    Procedure: LEFT TOTAL HIP  ARTHROPLASTY ANTERIOR APPROACH;  Surgeon: [name], MD;  Location: [facility];  Service: Orthopedics;  Laterality: Left;   Social History:  reports that she quit smoking about 6 years ago. She does not have any smokeless tobacco history on file. She reports that she does not drink alcohol or use illicit drugs.  Family / Support Systems Marital Status: Married Patient Roles: Spouse;Parent Spouse/Significant Other: spouse, [name] @ [phone] or (W) [phone] or (C) [phone] Children: two adult daughters, [name]) and [name]) - both supportive Anticipated Caregiver: husband Ability/Limitations of Caregiver: Husband can provide supervision at home for 1-2 weeks as needed (Husband works 6;30 am to 5 pm.) Caregiver Availability: 24/7 Family Dynamics: pt describes very good support from husband and daughters, however, all do work f/t  Social History Preferred language: English Religion: Arboriculturist Cultural Background: NA Education: HS Read: Yes Write: Yes Employment Status: Unemployed Date Retired/Disabled/Unemployed: reports she has "never really worked" - 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  Actually requires some slight redirection to get full interview completed.  She is aware of herself being "talkative" and blames this on pain meds.  She reports long standing hx of depression and anxiety (since I was a young girls).  She notes that she has been tearful here because her circumstances are  reminding her of her mother who had many ortho surgeries herself.  Notes mother died this year and it has been a "difficult thing for me to work through"    May benefit from neuropsych consult for coping issues if on CIR long enough.   Recent Psychosocial Issues: Mother's death this year. Pyschiatric History: Pt reports long term h/o depression and anxiety.  Never any [facility] hospitalizations.  Has private psychiatrist in , Dr. Juanita Craver, who she sees q 3 mos for med management Substance Abuse History: None  Patient / Family Perceptions, Expectations & Goals Pt/Family understanding of illness & functional limitations: Pt and family with good understanding of surgery performed and current functional limitations/ need for CIR. Premorbid pt/family roles/activities: Pt was independent at home using crutches.  Describes herself as very sedentary for approx past two years due to increasing hip pain and limitations related to this. Anticipated changes in roles/activities/participation: Little change anticipated if pt able to reach mod i goals.  Husband does plan to stay and provide supervision/ caregiver assistance at home initially Pt/family expectations/goals: "I just want to be done with the pain and get out of my house more"  [employer]: Other (Comment) (private psychiatric office) Premorbid Home Care/DME Agencies: Other (Comment) [facility] Go referred PTA) Transportation available at discharge: yes Resource referrals recommended: Neuropsychology  Discharge Planning Living Arrangements: Spouse/significant other Support Systems: Spouse/significant other;Children Type of Residence: Private residence Insurance Resources: Multimedia programmer (specify) (BCBS PPO) Financial Resources: Family Support Financial Screen Referred: No Living Expenses: Own Money Management: Spouse Does the patient have any problems obtaining your medications?: No Home Management: husband  primarily Patient/Family Preliminary Plans: pt to return home with her husbadn who plans to take 1-2 weeks off work to assist. Social Work Anticipated Follow Up Needs: HH/OP Expected length of stay: 7 - 9 days  Clinical Impression Very pleasant, oriented and talkative woman here following hip surgery.  Very good family support and with mod i goals.  Admits some grief over loss of mother this year seems to have resurfaced with this hospitalization.  Will follow for support and d/c planning needs.  Fady Stamps 08/02/2013, 2:35 PM

## 2013-08-02 NOTE — Progress Notes (Signed)
Physical Therapy Session Note  Patient Details  Name: [name] MRN: [id number] Date of Birth: 28-Nov-1950  Today's Date: 08/02/2013 Session 1 Time: 0800-0900 Time Calculation (min): 60 min Session 2 Time: 1350-1430 Time Calculation (min): 40 min  Short Term Goals: Week 1:  PT Short Term Goal 1 (Week 1): = LTGs due to anticipated LOS  Skilled Therapeutic Interventions/Progress Updates:    Session 1: Pt received supine in bed, agreeable to participate in therapy. Session focused on functional mobility and LLE strengthening. Pt propelled w/c 150' to gym w/ supervision, then ambulated 30' w/ RW w/ MinA. Pt completed exercises for LLE strengthening in supine and R sidelying. While rolling to R, pt w/ increased pain in L hip until therapist supported 100% weight of leg in neutral hip position. Pt able to flex/extend hip in sidelying with gravity eliminated with therapist supporting leg with no pain. In supine, pt performed 2x10 hip abd/add, heel slides, quad sets, and SLR. Pt with 10x sit<>stands from arm chair w/ use of arm rests, mod VC's for sequencing, and MinGuard. Pt propelled w/c 150' back to room w/ supervision, left seated in w/c w/ all needs within reach.  Session 2: Pt received seated in w/c, agreeable to participate in therapy. Pt reported she had done 100 ankle pumps since AM session as therapist had instructed for LLE edema management. Pt propelled w/c 150' to rehab gym w/ supervision. Session focused on LLE strengthening and ROM and functional endurance. Pt w/ stand step turn transfer to Nustep w/ RW w/ MinGuard, then completed 10' on Nustep w/ L1-L3 for endurance and LLE strength. Addressed bed mobility w/ sit>supine w/ MinA for managing LLE. Pt able to achieve prone position with rolling to R, pt reported feeling good in prone with pillow under chest. Pt completed 2x10 prone HS curls and prone hip ext w/ knee bent for glute strengthening. Pt also completed hip IR/ER AROM w/ knee  bent at 90 degrees for ROM and muscle coordination. Pt moved prone to supine w/ MinA for managing LLE in R sidelying, then supine > sit w/ supervision. Pt propelled w/c 150' back to room w/ S, left seated in w/c w/ all needs within reach.  Therapy Documentation Precautions:  Precautions Precautions: Fall Precaution Comments: R AKA - no prosthesis Restrictions Weight Bearing Restrictions: No Other Position/Activity Restrictions:  WBAT LLE General:   Vital Signs: Therapy Vitals Temp: 98.2 F (36.8 C) Temp src: Oral Pulse Rate: 86 Resp: 18 BP: 94/53 mmHg Patient Position (if appropriate): Lying Oxygen Therapy SpO2: 97 % O2 Device: None (Room air) Pain: Pain Assessment Pain Assessment: 0-10 Pain Score: 5  Pain Type: Surgical pain Pain Location: Hip Pain Orientation: Left Pain Descriptors / Indicators: Aching Pain Onset: With Activity Patients Stated Pain Goal: 0 Pain Intervention(s):  (Pt pre-medicated prior to session) Mobility:   Locomotion : Ambulation Ambulation/Gait Assistance: 4: Min assist Wheelchair Mobility Distance: 150  Trunk/Postural Assessment :    Balance:   Exercises:   Other Treatments:    See FIM for current functional status  Therapy/Group: Individual Therapy  Rada Hay Rada Hay, PT, DPT  08/02/2013, 9:06 AM

## 2013-08-02 NOTE — Progress Notes (Signed)
Occupational Therapy Session Note  Patient Details  Name: [name] MRN: [id number] Date of Birth: [birth year]  Today's Date: 08/02/2013 Time: 0929-1029 Time Calculation (min): 60 min  Short Term Goals: Week 1:  OT Short Term Goal 1 (Week 1): STG=LTG  Skilled Therapeutic Interventions/Progress Updates: ADL-retraining with focus on endurance, dynamic standing balance, functional mobility using RW during ADL, adapted bathing/dressing skills, DME/AE instruction.  Pt received seated in her w/c awaiting therapist in anticipation of planned bathing.   Patient recovered her clothing using RW with instructional cues for side-stepping and min guard assist while reaching below her waist.   Patient reports residual discomfort at thigh throughout activity but progressed through bathing at shower level, seated on tub bench, and dressed at edge of bed.    Pt educated on lateral leans for lower body dressing d/t inability to tolerate standing to pull up her pants.   Patient demo'd good effort throughout session however became depleted of endurance at 45 min with increased pain.     Therapy Documentation Precautions:  Precautions Precautions: Fall Precaution Comments: R AKA - no prosthesis Restrictions Weight Bearing Restrictions: No Other Position/Activity Restrictions:  WBAT LLE  Pain: Pain Assessment Pain Assessment: 0-10 Pain Score: 4  Pain Type: Acute pain Pain Location: Leg Pain Orientation: Left;Upper Pain Descriptors / Indicators: Jabbing Pain Onset: With Activity Patients Stated Pain Goal: 0 Pain Intervention(s): Shower;Rest  See FIM for current functional status  Therapy/Group: Individual Therapy  Second session: Time: 1300-1330 Time Calculation (min):  30 min  Pain Assessment: No/denies pain  Skilled Therapeutic Interventions: (15 min) ADL-retraining with focus on standing tolerance, endurance while standing at sink, completing oral care.   Patient able to complete task,  ambulating using RW and standing at sink with standby assist.   Pt. demo'd mild bil UE tremors during task but reported no symptoms; question anxiety.  (15 min) Therex using Sci-Fit, at level 2 and random routine, maintaining rate of 40-50 rpm X 10 min.   Patient demo'd moderate exertion from activity (BORG scale 13) but reported task as being stimulating and consistent with goal of increasing her general fitness and endurance.   Patient completed 1.2 mile (equivalent), expending 25 calories during this task while seated in w/c with leg elevated.   TEDs provided and applied at LLE prior to treatment.   See FIM for current functional status  Therapy/Group: Individual Therapy  Desarai Barrack 08/02/2013, 10:30 AM

## 2013-08-02 NOTE — Progress Notes (Signed)
Recreational Therapy Assessment and Plan  Patient Details  Name: [name] MRN: [id number] Date of Birth: 04-12-1950 Today's Date: 08/02/2013  Rehab Potential: Good ELOS: 10 days   Assessment Clinical Impression: Problem List:  Patient Active Problem List    Diagnosis  Date Noted   .  Osteoarthritis of hip  07/30/2013   .  Postoperative anemia due to acute blood loss  07/29/2013   .  Unspecified asthma(493.90)  07/29/2013   .  Depression  07/29/2013   .  Anxiety state, unspecified  07/29/2013   .  History of Sarcoma, Bone  07/29/2013   .  Phantom limb (syndrome)  07/29/2013   .  Eczema  07/29/2013   .  History of transfusion  07/29/2013   .  Obstructive sleep apnea  07/29/2013   .  OA (osteoarthritis) of hip  07/28/2013   .  Chest pain  07/12/2013   .  Chest pain of uncertain etiology  41/66/0630   .  Fibromyalgia  07/12/2013   .  GERD (gastroesophageal reflux disease)  07/12/2013   .  Hiatal hernia  07/12/2013    Past Medical History:  Past Medical History   Diagnosis  Date   .  Complication of anesthesia      NAUSEA   .  Chest pain      pt states due to esophageal problem - primary care MD aware   .  Hives      since pneumonia vaccine   .  Asthma    .  Depression    .  Anxiety    .  Fibromyalgia    .  Spondylosis    .  Bulging disc    .  Hx of sarcoma of bone      rt knee with aka   .  Phantom limb pain      rt leg   .  Leg pain, left    .  Eczema      ears   .  GERD (gastroesophageal reflux disease)      " alot of burping"   .  Constipation, chronic    .  Urgency incontinence    .  History of transfusion    .  Cancer      hx sarcoma rt knee with amputation   .  Difficulty sleeping    .  Sleep apnea      pt had recent sleep study done but does not know results - report called for and put on chart showing significant obstructive sleep apnea   .  Hypertension     Past Surgical History:  Past Surgical History   Procedure  Laterality  Date   .   Abdominal hysterectomy   1980'S   .  Sarcoma   1973     RT KNEE (aka)   .  External ear surgery   AGE 62   .  Total hip arthroplasty  Left  07/28/2013     Procedure: LEFT TOTAL HIP ARTHROPLASTY ANTERIOR APPROACH; Surgeon: [name], MD; Location: [facility]; Service: Orthopedics; Laterality: Left;    Assessment & Plan  Clinical Impression: [name] is a 63 y.o. 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 She has failed  conservative measures and elected to undergo L-THR by Dr. Wynelle Link on 07/28/13. Post op with leucocytosis as well as ABLA. She is WBAT and on Xarelto for DVT prophylaxis. Patient transferred to CIR on 07/30/2013.   Pt presents with decreased activity tolerance, decreased functional mobility, decreased balance, increased pain, & anxiety Limiting pt's independence with leisure/community pursuits.   Leisure History/Participation Premorbid leisure interest/current participation: Medical laboratory scientific officer - Psychologist, forensic (helping with 4 grandkids, doing things with them) Other Leisure Interests: Reading Leisure Participation Style: With Family/Friends Awareness of Community Resources: Good-identify 3 post discharge leisure resources Psychosocial / Spiritual Spiritual Interests: Church;Womens'Men's Groups Stress Management: Poor Methods of Stress Management: pt reports use of various prescribed meds Social interaction - Mood/Behavior: Cooperative Academic librarian Appropriate for Education?: Yes Recreational Therapy Orientation Orientation -Reviewed with patient: Available activity resources Strengths/Weaknesses Patient Strengths/Abilities: Willingness to participate Patient weaknesses: Physical limitations;Minimal  Premorbid Leisure Activity TR Patient demonstrates impairments in the following area(s): Edema;Endurance;Motor;Pain;Safety TR Additional Impairment(s): None  Plan Rec Therapy Plan Is patient appropriate for Therapeutic Recreation?: Yes Rehab Potential: Good Treatment times per week: Min 1 time per week >20 minutes Estimated Length of Stay: 10 days TR Treatment/Interventions: Adaptive equipment instruction;1:1 session;Balance/vestibular training;Functional mobility training;Community reintegration;Patient/family education;Therapeutic activities;Recreation/leisure participation;Therapeutic exercise;UE/LE Coordination activities;Wheelchair propulsion/positioning Recommendations for other services: Neuropsych  Recommendations for other services: Neuropsych  Discharge Criteria: Patient will be discharged from TR if patient refuses treatment 3 consecutive times without medical reason.  If treatment goals not met, if there is a change in medical status, if patient makes no progress towards goals or if patient is discharged from hospital.  The above assessment, treatment plan, treatment alternatives and goals were discussed and mutually agreed upon: by patient  [place] 08/02/2013, 4:00 PM

## 2013-08-02 NOTE — Care Management Note (Signed)
Inpatient [facility] Individual Statement of Services  Patient Name:  [name]  Date:  08/02/2013  Welcome to the [facility].  Our goal is to provide you with an individualized program based on your diagnosis and situation, designed to meet your specific needs.  With this comprehensive rehabilitation program, you will be expected to participate in at least 3 hours of rehabilitation therapies Monday-Friday, with modified therapy programming on the weekends.  Your rehabilitation program will include the following services:  Physical Therapy (PT), Occupational Therapy (OT), 24 hour per day rehabilitation nursing, Therapeutic Recreaction (TR), Psychology, Case Management (Social Worker), Rehabilitation Medicine, Nutrition Services and Pharmacy Services  Weekly team conferences will be held on Tuesdays to discuss your progress.  Your Social Worker will talk with you frequently to get your input and to update you on team discussions.  Team conferences with you and your family in attendance may also be held.  Expected length of stay: 7-9 days  Overall anticipated outcome: modified independent  Depending on your progress and recovery, your program may change. Your Social Worker will coordinate services and will keep you informed of any changes. Your Social Worker's name and contact numbers are listed  below.  The following services may also be recommended but are not provided by the [facility] will be made to provide these services after discharge if needed.  Arrangements include referral to agencies that provide these services.  Your insurance has been verified to be:  BCBS PPO Your primary doctor is:  Aimee Lischke [facility])  Pertinent information will be shared with your doctor and your insurance company.  Social  Worker:  [place], [place] or ([phone]   Information discussed with and copy given to patient by: Lennart Pall, 08/02/2013, 2:42 PM

## 2013-08-03 ENCOUNTER — Inpatient Hospital Stay (HOSPITAL_COMMUNITY): Payer: BC Managed Care – PPO | Admitting: Physical Therapy

## 2013-08-03 ENCOUNTER — Encounter (HOSPITAL_COMMUNITY): Payer: BC Managed Care – PPO

## 2013-08-03 ENCOUNTER — Inpatient Hospital Stay (HOSPITAL_COMMUNITY): Payer: BC Managed Care – PPO

## 2013-08-03 DIAGNOSIS — M1610 Unilateral primary osteoarthritis, unspecified hip: Secondary | ICD-10-CM

## 2013-08-03 DIAGNOSIS — IMO0001 Reserved for inherently not codable concepts without codable children: Secondary | ICD-10-CM

## 2013-08-03 DIAGNOSIS — M169 Osteoarthritis of hip, unspecified: Secondary | ICD-10-CM

## 2013-08-03 DIAGNOSIS — Z96649 Presence of unspecified artificial hip joint: Secondary | ICD-10-CM

## 2013-08-03 DIAGNOSIS — D62 Acute posthemorrhagic anemia: Secondary | ICD-10-CM

## 2013-08-03 MED ORDER — TRAZODONE HCL 50 MG PO TABS
50.0000 mg | ORAL_TABLET | Freq: Every evening | ORAL | Status: DC | PRN
  Administered 2013-08-03: 50 mg via ORAL
  Filled 2013-08-03: qty 1

## 2013-08-03 NOTE — Progress Notes (Signed)
Occupational Therapy Session Note  Patient Details  Name: [name] MRN: [id number] Date of Birth: May 31, 1950  Today's Date: 08/03/2013 Time: 0930-1030 Time Calculation (min): 60 min  Short Term Goals: Week 1:  OT Short Term Goal 1 (Week 1): STG=LTG  Skilled Therapeutic Interventions/Progress Updates: ADL-retraining with focus on lower body dressing (supine and sitting at edge of bed, using lateral leans), endurance, dynamic standing balance, and effective use of DME.   Patient received seated in her w/c, anticipating bathing and dressing session.   Using RW pt recovered clothing from her dresser and placed them on the bed.   Patient ambulated to bathroom using RW and transferred to tub bench to complete bathing with supervision for safety and setup assist to provide supplies.  Patient completed bathing unassisted but reported increased pain at LE and requested RN contact for pain meds.   Pt returned to edge of bed to dress but fatigued after donning upper body clothing (bra and shirt) and was unable to rise to stand to put on her pants d/t weakness.   Pt was educated on use of lateral leans and/or bed rolling to don pants which she accomplished with min instructional cues to progess.  Pt left in bed with RN providing pain meds.   Pt used reacher 3 times during session to pick up items dropped on the floor.   She reports use of BSC at home with no other DME needs expressed or revealed during this session.      Therapy Documentation Precautions:  Precautions Precautions: Fall Precaution Comments: R AKA - no prosthesis Restrictions Weight Bearing Restrictions: No Other Position/Activity Restrictions:  WBAT LLE  Pain: Pain Assessment Pain Assessment: 0-10 Pain Score: 5  Pain Type: Surgical pain Pain Location: Hip Pain Orientation: Left Pain Descriptors / Indicators: Aching Pain Onset: With Activity Pain Intervention(s): Medication (See eMAR)  See FIM for current functional  status  Therapy/Group: Individual Therapy  Second session: Time: 5427-0623 Time Calculation (min):  30 min  Pain Assessment: No/denies pain  Skilled Therapeutic Interventions: ADL-retraining with focus on AE use to improve LB dressing skills.  Pt educated on use of reacher (alternate functions addressed), sock aid, and elastic shoe laces.   Patient demo'd ability to use reach as demonstrated and donned left sock using sock aid.   Pt provided elastic shoe laces and advised to use shoes which include heel strap for safety.    Pt states that she has AE at home and will further explore need for specialized footbrush as discussed this date.   See FIM for current functional status  Therapy/Group: Individual Therapy  [name] 08/03/2013, 10:31 AM

## 2013-08-03 NOTE — Progress Notes (Signed)
Patient refused CPAP at this time.  Patient has home CPAP unit in room but wishes not to use it tonight.  Patient encouraged to call for Respiratory if she wishes to wear CPAP.  [facility] CPAP unit removed from patient room.

## 2013-08-03 NOTE — Progress Notes (Signed)
Patient stated that she was not gonna wear her home CPAP tonight. RT asked pt to call RN if she changed her mind.

## 2013-08-03 NOTE — Progress Notes (Signed)
Physical Therapy Session Note  Patient Details  Name: [name] MRN: [id number] Date of Birth: 08/03/1950  Today's Date: 08/03/2013 Time: 7741-2878 Time Calculation (min): 50 min  Short Term Goals: Week 1:  PT Short Term Goal 1 (Week 1): = LTGs due to anticipated LOS  Skilled Therapeutic Interventions/Progress Updates:  Pt. Received seated in w/c with RN present and administering medication. Pt. Agreeable to therapy. Pt. Requested additional education on hip recovery/healing with ambulation goals and anticipated return home at end of week. Pt. Quested PTA on return to driving; pt instructed that doctor would determine return to driving.  1:1 Treatment focused on education, increased activity endurance and transfers. Pt. Assist level is min guard assist with RW. Car transfer with verbal instruction, demonstration; pt able to replicate transfer safely with one cue to correct technique. Teach-back was successful and pt performed safely and at (S) making the transfer into and out of car and back to w/c with RW for safety and additional support. Pt. Demonstrated greatly improved w/c mgmt with instruction on turning in tight spaces and counter-rotation technique for improved efficiency. Pt. Required additional time to cover hip replacement procedure, proper use (since patient is very cautious) and any precautions beyond pain governing actions/activities.  Pain: see below.  Pt. Condition post therapy session: pt. Semi-reclined in bed with all needs within reach.   Therapy Documentation Precautions:  Precautions Precautions: Fall Precaution Comments: R AKA - no prosthesis Restrictions Weight Bearing Restrictions: No Other Position/Activity Restrictions:  WBAT LLE General:   Pain: Pain Assessment Pain Assessment: 0-10 Pain Score: 2  Pain Type: Surgical pain Pain Location: Hip Pain Orientation: Left Pain Descriptors / Indicators: Burning;Aching Pain Onset: With Activity Patients  Stated Pain Goal: 0 Pain Intervention(s): RN made aware  Locomotion : Ambulation Ambulation/Gait Assistance: 5: Supervision Wheelchair Mobility Distance: 150   See FIM for current functional status  Therapy/Group: Individual Therapy  Juluis Mire 08/03/2013, 4:41 PM

## 2013-08-03 NOTE — Patient Care Conference (Signed)
Inpatient RehabilitationTeam Conference and Plan of Care Update Date: 08/03/2013   Time: 2:25 PM    Patient Name: [name]      Medical Record Number: [id number]  Date of Birth: October 30, 1950 Sex: Female         Room/Bed: 4M11C/4M11C-01 Payor Info: Payor: [place] / Plan: BCBS PPO OUT OF STATE / Product Type: *No Product type* /    Admitting Diagnosis: R THR L AKA  Admit Date/Time:  07/30/2013  5:30 PM Admission Comments: No comment available   Primary Diagnosis:  <principal problem not specified> Principal Problem: <principal problem not specified>  Patient Active Problem List   Diagnosis Date Noted  . Osteoarthritis of hip 07/30/2013  . Postoperative anemia due to acute blood loss 07/29/2013  . Unspecified asthma(493.90) 07/29/2013  . Depression 07/29/2013  . Anxiety state, unspecified 07/29/2013  . History of Sarcoma, Bone 07/29/2013  . Phantom limb (syndrome) 07/29/2013  . Eczema 07/29/2013  . History of transfusion 07/29/2013  . Obstructive sleep apnea 07/29/2013  . OA (osteoarthritis) of hip 07/28/2013  . Chest pain 07/12/2013  . Chest pain of uncertain etiology 78/29/5621  . Fibromyalgia 07/12/2013  . GERD (gastroesophageal reflux disease) 07/12/2013  . Hiatal hernia 07/12/2013    Expected Discharge Date: Expected Discharge Date: 08/06/13  Team Members Present: Physician leading conference: Dr. Alger Simons Social Worker Present: Lennart Pall, LCSW Nurse Present: Elliot Cousin, RN PT Present: [place] Shi, PT OT Present: Salome Spotted, OT Other (Discipline and Name): Danne Baxter, RN [facility]) PPS Coordinator present : Daiva Nakayama, RN, CRRN;Becky Alwyn Ren, PT     Current Status/Progress Goal Weekly Team Focus  Medical   left THA, right AKA---no prosthesis, pain better, still some edema  improve functional activity tolerance  pain mgt, wound care, edema   Bowel/Bladder   Continent of bowel and bladder. LBM 08/03/2013. Ambulatory with walker to  bathroom. Uses bedpan at times.  Remain continent of bowel and bladder.  Monitor for any s/s of diarrhea and constipation.   Swallow/Nutrition/ Hydration             ADL's   Supervision for BADL and transfers  Overall Mod I for BADL, Supervision for iADL  Pain managment during BADL, functional transfers, adapted bathing/dressing skills, dynamic standing balance, general endurance, UE strengthening.   Mobility   Mod I for bed mobility; SBA for transfers; CGA for a curb with RW; SBA ambulation up to 55'  Mod I ambulation x 150'; 1 stair min A goal  endurance; safety with gait and transfers; HEP with pain management focus   Communication             Safety/Cognition/ Behavioral Observations            Pain   c/o left hi pain. PRN's are the following: Tramadol 50 mg, Tylenol 325-650 mg q 4 hr, Dilaudid 1-2 mg q 6 hr, Robaxin 500mg  for muscle spasm.  Pain level 3 or less on a scale of 0-10,  Monitor for any onset of pain.   Skin   Incision with steri-strips to left hip, Dry and warm to touch. Mild bruising noted. OTA. No active bleeding noted.  No new skin breakdown/infection.  Assess skin q shift.    Rehab Goals Patient on target to meet rehab goals: Yes *See Care Plan and progress notes for long and short-term goals.  Barriers to Discharge: doesn't use prosthesis    Possible Resolutions to Barriers:  utilizing sliding board for transfers as well  as her strong UE's    Discharge Planning/Teaching Needs:  home with husband to assist initially stay and provide assistance      Team Discussion:  Making good gains but anxiety is a limiting factor.  Currently supervision/ min assist overall.  Feel she will be ready for d/c by end of week.  Need to schedule family ed on Thursday.    Revisions to Treatment Plan:  Add neuropsych for coping assistance   Continued Need for Acute Rehabilitation Level of Care: The patient requires daily medical management by a physician with specialized  training in physical medicine and rehabilitation for the following conditions: Daily direction of a multidisciplinary physical rehabilitation program to ensure safe treatment while eliciting the highest outcome that is of practical value to the patient.: Yes Daily medical management of patient stability for increased activity during participation in an intensive rehabilitation regime.: Yes Daily analysis of laboratory values and/or radiology reports with any subsequent need for medication adjustment of medical intervention for : Post surgical problems;Other  Hady Niemczyk 08/03/2013, 2:37 PM

## 2013-08-03 NOTE — Progress Notes (Signed)
Physical Therapy Session Note  Patient Details  Name: [name] MRN: [id number] Date of Birth: March 31, 1950  Today's Date: 08/03/2013 Time: 1100-1200 Time Calculation (min): 60 min  Short Term Goals: Week 1:  PT Short Term Goal 1 (Week 1): = LTGs due to anticipated LOS  Skilled Therapeutic Interventions/Progress Updates:    Therapeutic Exercise: PT instructs pt in ROM and strengthening exercises to L LE: heel slides, supine hip abduction/adduction, hip IR/ER in hook lie, single leg bridge, clam shell in R side lie with trunk rotation, LAQ: x 10 reps.   Therapeutic Activity: Pt demonstrates mod I supine to sit via long sit in bed without a rail. PT instructs pt in sit to stand with RW with verbal cues for hand placement req SBA for safety and hop transfer bed to w/c req SBA for safety.   Gait Training: PT instructs pt in ambulation x 7' + 45' with RW req SBA for safety and verbal cues to ensure the walker stops rolling prior to hopping up to it. PT instructs pt in ascending/descending 1 curb step with RW req CGA and verbal cues for safe walker use x 2 reps.   W/C Management: Pt demonstrates ability to self propel manual w/c mod I x 150'. Pt req verbal cues for legrest management and pt demonstrates understanding (SBA).   Pt is progressing well with functional mobility - demonstrates anxiety at times, evidenced by arms/L leg shaking. L hip pain is the primary limiting factor in functional mobility. Pt will benefit from continued work on gait endurance, curb step, and standing dynamic balance.   Therapy Documentation Precautions:  Precautions Precautions: Fall Precaution Comments: R AKA - no prosthesis Restrictions Weight Bearing Restrictions: No Other Position/Activity Restrictions:  WBAT LLE       Pain: Pain Assessment Pain Assessment: 0-10 Pain Score: 0-No pain Pain Type: Surgical pain Pain Location: Hip Pain Orientation: Left Pain Descriptors / Indicators:  Aching Pain Onset: With Activity Pain Intervention(s): Medication (See eMAR)      See FIM for current functional status  Therapy/Group: Individual Therapy  Palmina Clodfelter M 08/03/2013, 11:05 AM

## 2013-08-03 NOTE — Progress Notes (Signed)
[place] PHYSICAL MEDICINE & REHABILITATION     PROGRESS NOTE    Subjective/Complaints: Still having difficulties with bed mobility. Notes some left leg swelling. Pain is under control   Objective: Vital Signs: Blood pressure 116/65, pulse 81, temperature 98.2 F (36.8 C), temperature source Oral, resp. rate 19, height 5\' 2"  (1.575 m), weight 72.4 kg (159 lb 9.8 oz), SpO2 96.00%. No results found. No results found for this basename: WBC, HGB, HCT, PLT,  in the last 72 hours  Recent Labs  08/02/13 0620  NA 144  K 4.4  CL 106  GLUCOSE 112*  BUN 17  CREATININE 0.87  CALCIUM 8.7   CBG (last 3)  No results found for this basename: GLUCAP,  in the last 72 hours  Wt Readings from Last 3 Encounters:  07/30/13 72.4 kg (159 lb 9.8 oz)  07/28/13 69.854 kg (154 lb)  07/28/13 69.854 kg (154 lb)    Physical Exam:  Constitutional: She is oriented to person, place, and time. She appears well-developed and well-nourished.  HENT: oral mucosa pink and moist, dentition good  Head: Normocephalic and atraumatic.  Eyes: Conjunctivae are normal. Pupils are equal, round, and reactive to light.  Neck: Normal range of motion. Neck supple.  Cardiovascular: Normal rate and regular rhythm. No murmurs. Trace edema LLE Respiratory: Effort normal and breath sounds normal. No respiratory distress. She has no wheezes.  GI: Soft. Bowel sounds are normal. She exhibits no distension. There is no tenderness.  Musc: mild to moderate edema about the left thigh. Right AK stump with soft tissue/fat. Very short Neurological: She is alert and oriented to person, place, and time. No cranial nerve deficit. Motor 5/5 in BUE. 2 Left hip flex, Knee ext, 5/5 Left ankle DF/PF. Right HF 4+/5.  Skin: Skin is warm and dry. Anterior hip incision is clean and dry with steristrips. Psychiatric: Her speech is normal and behavior is normal. Thought content normal. Her mood appears normal. Cognition and memory are normal.       Assessment/Plan: 1. Functional deficits secondary to OA left hip s/p right THA which require 3+ hours per day of interdisciplinary therapy in a comprehensive inpatient rehab setting. Physiatrist is providing close team supervision and 24 hour management of active medical problems listed below. Physiatrist and rehab team continue to assess barriers to discharge/monitor patient progress toward functional and medical goals.  FIM: FIM - Bathing Bathing Steps Patient Completed: Chest;Right Arm;Left Arm;Abdomen;Front perineal area;Buttocks;Right upper leg;Left upper leg Bathing: 4: Min-Patient completes 8-9 74f 10 parts or 75+ percent  FIM - Upper Body Dressing/Undressing Upper body dressing/undressing steps patient completed: Thread/unthread left bra strap;Thread/unthread right bra strap;Hook/unhook bra;Thread/unthread right sleeve of pullover shirt/dresss;Thread/unthread left sleeve of pullover shirt/dress;Put head through opening of pull over shirt/dress;Pull shirt over trunk Upper body dressing/undressing: 7: Complete Independence: No helper FIM - Lower Body Dressing/Undressing Lower body dressing/undressing steps patient completed: Thread/unthread right underwear leg;Thread/unthread left underwear leg;Pull underwear up/down;Thread/unthread right pants leg;Thread/unthread left pants leg Lower body dressing/undressing: 4: Min-Patient completed 75 plus % of tasks  FIM - Toileting Toileting steps completed by patient: Adjust clothing prior to toileting;Performs perineal hygiene;Adjust clothing after toileting Toileting Assistive Devices: Grab bar or rail for support Toileting: 4: Steadying assist  FIM - Air cabin crew Transfers: 0-Activity did not occur  FIM - Control and instrumentation engineer Devices: Environmental consultant;Bed rails Bed/Chair Transfer: 5: Supine > Sit: Supervision (verbal cues/safety issues);4: Bed > Chair or W/C: Min A (steadying Pt. > 75%);4: Chair or W/C >  Bed: Min A (steadying Pt. > 75%)  FIM - Locomotion: Wheelchair Distance: 150 Locomotion: Wheelchair: 5: Travels 150 ft or more: maneuvers on rugs and over door sills with supervision, cueing or coaxing FIM - Locomotion: Ambulation Locomotion: Ambulation Assistive Devices: Administrator Ambulation/Gait Assistance: 4: Min assist Locomotion: Ambulation: 1: Travels less than 50 ft with minimal assistance (Pt.>75%)  Comprehension Comprehension Mode: Auditory Comprehension: 6-Follows complex conversation/direction: With extra time/assistive device  Expression Expression Mode: Verbal Expression: 5-Expresses complex 90% of the time/cues < 10% of the time  Social Interaction Social Interaction: 6-Interacts appropriately with others with medication or extra time (anti-anxiety, antidepressant).  Problem Solving Problem Solving: 5-Solves complex 90% of the time/cues < 10% of the time  Memory Memory: 6-More than reasonable amt of time Medical Problem List and Plan:  1. 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 DVT Prophylaxis/Anticoagulation: Pharmaceutical: Xarelto  3. Pain Management: tylenol, robaxin, ice , and heat. She is unable to tolerate narcotics  -self reported hx of fibromyalgia.    4. Mood: patient is motivated and in good spirits  -maintain buspar and celexa, hs doxepin at current doses.  5. Neuropsych: This patient is capable of making decisions on her own behalf.  6. ABLA: iron supplementation, hgb recovering 7. Leukocytosis: temp resolved, wbc's improving -ua neg, culture neg, wound, remainder of exam benign    LOS (Days) 4 A FACE TO FACE EVALUATION WAS PERFORMED  SWARTZ,ZACHARY T 08/03/2013 8:10 AM

## 2013-08-04 ENCOUNTER — Inpatient Hospital Stay (HOSPITAL_COMMUNITY): Payer: BC Managed Care – PPO

## 2013-08-04 ENCOUNTER — Encounter (HOSPITAL_COMMUNITY): Payer: BC Managed Care – PPO

## 2013-08-04 ENCOUNTER — Inpatient Hospital Stay (HOSPITAL_COMMUNITY): Payer: BC Managed Care – PPO | Admitting: *Deleted

## 2013-08-04 DIAGNOSIS — D62 Acute posthemorrhagic anemia: Secondary | ICD-10-CM

## 2013-08-04 DIAGNOSIS — M169 Osteoarthritis of hip, unspecified: Secondary | ICD-10-CM

## 2013-08-04 DIAGNOSIS — IMO0001 Reserved for inherently not codable concepts without codable children: Secondary | ICD-10-CM

## 2013-08-04 DIAGNOSIS — Z96649 Presence of unspecified artificial hip joint: Secondary | ICD-10-CM

## 2013-08-04 DIAGNOSIS — M1610 Unilateral primary osteoarthritis, unspecified hip: Secondary | ICD-10-CM

## 2013-08-04 MED ORDER — OXYBUTYNIN CHLORIDE 5 MG PO TABS
5.0000 mg | ORAL_TABLET | Freq: Two times a day (BID) | ORAL | Status: DC
  Administered 2013-08-04 – 2013-08-06 (×5): 5 mg via ORAL
  Filled 2013-08-04 (×7): qty 1

## 2013-08-04 NOTE — Progress Notes (Signed)
Pt states she does not want to wear CPAP tonight and plans to "continue wearing her CPAP machine when she goes home on Friday". No machine noted in room. RT will continue to monitor.

## 2013-08-04 NOTE — Progress Notes (Signed)
Physical Therapy Session Note  Patient Details  Name: [name] MRN: [id number] Date of Birth: 1950/07/23  Today's Date: 08/04/2013 Time: 8786-7672 &  0947-0962 Time Calculation (min): 55 min & 40 min  Short Term Goals: Week 1:  PT Short Term Goal 1 (Week 1): = LTGs due to anticipated LOS  Skilled Therapeutic Interventions/Progress Updates:  AM Session (55 min): Pt. Received supine in bed with HOB elevated. Pt. Agreeable to therapy session  1:1 Treatment focused on Bilateral LE strengthening, gait/balance, and functional transfers. Pt. Assist level is (S) with RW. Treatment: 1 step x 6 anterior and posterior approach min A for support and safety with initial instruction progressing to (S) for functional use of RW and step/curb. Car transfer x 3 with verbal cues and teach-back for safety and proper technique for safety. Pt. Ambulated >50' with RW; requires frequent rest breaks due to shoulder discomfort/soreness. Pt. Tolerated increased endurance and strengthening activities well; requests breaks and water to drink throughout session.  Pain: no c/o pain presently. Provided pain medication prior to session by RN.  Pt. Seated in w/c with all needs within reach.  PM Session (40 min): Pt. Received seated in w/c and agreeable to therapy.  1:1 Treatment focused on therapeutic activities: curb/ramp/stair, ambulation, dynamic balance activities. Pt. Assist level is (S) with RW. Treatment consisted of ramp/curb instruction, demonstration, and teach back. Pt. Displayed improved use/positioning of RW and was receptive to changes in use of AD for safety. Pt. Demonstrated improved sit<>stand activities with pt's preferred standing method; coached on, and able to perform, therapist preferred transfer method at (s). Pt. Response was good with decreasing performance with fatigue. Pt. States she's feeling tired overall and describes having a "brain fog."  Pain: Unrated, L hip soreness increasing  with activity. RN notified.  Pt. Condition post therapy session: positioned supine in bed with all needs within reach. Pt. Ice pack L Hip for pain management.   Therapy Documentation Precautions:  Precautions Precautions: Fall Precaution Comments: R AKA - no prosthesis Restrictions Weight Bearing Restrictions: No Other Position/Activity Restrictions:  WBAT LLE Locomotion : Ambulation Ambulation/Gait Assistance: 5: Supervision   See FIM for current functional status  Therapy/Group: Individual Therapy  Juluis Mire 08/04/2013, 4:05 PM

## 2013-08-04 NOTE — Progress Notes (Signed)
Recreational Therapy Session Note  Patient Details  Name: [name] MRN: [id number] Date of Birth: 1950/05/08 Today's Date: 08/04/2013  Pain: no c/o Skilled Therapeutic Interventions/Progress Updates: Pt set up assist for simple activities in room w/c level.  Pt discussing excitement about progress and planned discharge.   Pt able to identify >5 leisure activities for participation at discharge either alone or with family.  Pt identifies potential obstacles & ways to negotiate them or adapt activity for greater independence with mod I.  Therapy/Group: Individual Therapy   [name] 08/04/2013, 5:27 PM

## 2013-08-04 NOTE — Progress Notes (Signed)
Occupational Therapy Session Note  Patient Details  Name: [name] MRN: [id number] Date of Birth: Apr 10, 1950  Today's Date: 08/04/2013 Time: 1100-1158 Time Calculation (min): 58 min  Short Term Goals: Week 1:  OT Short Term Goal 1 (Week 1): STG=LTG  Skilled Therapeutic Interventions/Progress Updates:    Pt resting in bed upon arrival.  Pt initially sated she just wanted to take a "bird bath" but later decided to bathe at shower level.  Pt gathered clothing and placed on bed prior to amb with RW in to bathroom.  Pt completed toileting tasks prior to transfer to shower.  Pt completed dressing tasks with sit<>stand from EOB.  Pt returned to bed at end of session.  Focus on activity tolerance, functional amb with RW, toilet transfers, toileting, shower transfers, standing balance, sit<>stand, and safety awareness.    Therapy Documentation Precautions:  Precautions Precautions: Fall Precaution Comments: R AKA - no prosthesis Restrictions Weight Bearing Restrictions: No Other Position/Activity Restrictions:  WBAT LLE   Pain: Pain Assessment Pain Assessment: 0-10 Pain Score: 4  Pain Type: Acute pain Pain Location: Hip Pain Orientation: Left Pain Descriptors / Indicators: Aching Pain Onset: With Activity Patients Stated Pain Goal: 2 Pain Intervention(s): RN made aware;Repositioned  See FIM for current functional status  Therapy/Group: Individual Therapy  Leroy Libman 08/04/2013, 12:08 PM

## 2013-08-04 NOTE — Progress Notes (Signed)
PHYSICAL MEDICINE & REHABILITATION     PROGRESS NOTE    Subjective/Complaints: Complains of urinary frequency still.   Objective: Vital Signs: Blood pressure 90/48, pulse 77, temperature 98.2 F (36.8 C), temperature source Oral, resp. rate 18, height 5\' 2"  (1.575 m), weight 71.3 kg (157 lb 3 oz), SpO2 95.00%. No results found. No results found for this basename: WBC, HGB, HCT, PLT,  in the last 72 hours  Recent Labs  08/02/13 0620  NA 144  K 4.4  CL 106  GLUCOSE 112*  BUN 17  CREATININE 0.87  CALCIUM 8.7   CBG (last 3)  No results found for this basename: GLUCAP,  in the last 72 hours  Wt Readings from Last 3 Encounters:  08/04/13 71.3 kg (157 lb 3 oz)  07/28/13 69.854 kg (154 lb)  07/28/13 69.854 kg (154 lb)    Physical Exam:  Constitutional: She is oriented to person, place, and time. She appears well-developed and well-nourished.  HENT: oral mucosa pink and moist, dentition good  Head: Normocephalic and atraumatic.  Eyes: Conjunctivae are normal. Pupils are equal, round, and reactive to light.  Neck: Normal range of motion. Neck supple.  Cardiovascular: Normal rate and regular rhythm. No murmurs. Trace edema LLE Respiratory: Effort normal and breath sounds normal. No respiratory distress. She has no wheezes.  GI: Soft. Bowel sounds are normal. She exhibits no distension. There is no tenderness.  Musc: mild to moderate edema about the left thigh. Right AK stump with soft tissue/fat. Very short Neurological: She is alert and oriented to person, place, and time. No cranial nerve deficit. Motor 5/5 in BUE. 2 Left hip flex, Knee ext, 5/5 Left ankle DF/PF. Right HF 4+/5.  Skin: Skin is warm and dry. Anterior hip incision is clean and dry with steristrips. Psychiatric: Her speech is normal and behavior is normal. Thought content normal. Her mood appears normal. Cognition and memory are normal.      Assessment/Plan: 1. Functional deficits secondary to OA  left hip s/p right THA which require 3+ hours per day of interdisciplinary therapy in a comprehensive inpatient rehab setting. Physiatrist is providing close team supervision and 24 hour management of active medical problems listed below. Physiatrist and rehab team continue to assess barriers to discharge/monitor patient progress toward functional and medical goals.  FIM: FIM - Bathing Bathing Steps Patient Completed: Chest;Right Arm;Left Arm;Abdomen;Front perineal area;Buttocks;Right upper leg;Left upper leg;Left lower leg (including foot) Bathing: 6: More than reasonable amount of time  FIM - Upper Body Dressing/Undressing Upper body dressing/undressing steps patient completed: Thread/unthread right bra strap;Thread/unthread left bra strap;Hook/unhook bra;Thread/unthread right sleeve of pullover shirt/dresss;Thread/unthread left sleeve of pullover shirt/dress;Put head through opening of pull over shirt/dress;Pull shirt over trunk Upper body dressing/undressing: 6: More than reasonable amount of time FIM - Lower Body Dressing/Undressing Lower body dressing/undressing steps patient completed: Thread/unthread right underwear leg;Thread/unthread left underwear leg;Pull underwear up/down;Thread/unthread right pants leg;Thread/unthread left pants leg;Pull pants up/down;Fasten/unfasten pants;Don/Doff right shoe Lower body dressing/undressing: 6: More than reasonable amount of time (supine, lateral leans to pull up underwear and pants)  FIM - Toileting Toileting steps completed by patient: Adjust clothing prior to toileting;Performs perineal hygiene;Adjust clothing after toileting Toileting Assistive Devices: Grab bar or rail for support Toileting: 4: Steadying assist  FIM - Radio producer Devices: Nurse, learning disability Transfers: 6-To toilet/ BSC;6-From toilet/BSC  FIM - Control and instrumentation engineer Devices: Environmental consultant;Bed rails Bed/Chair  Transfer: 6: Sit > Supine: No assist;6: Chair or W/C >  Bed: No assist  FIM - Locomotion: Wheelchair Distance: 150 Locomotion: Wheelchair: 5: Travels 150 ft or more: maneuvers on rugs and over door sills with supervision, cueing or coaxing FIM - Locomotion: Ambulation Locomotion: Ambulation Assistive Devices: Administrator Ambulation/Gait Assistance: 5: Supervision Locomotion: Ambulation: 1: Travels less than 50 ft with supervision/safety issues  Comprehension Comprehension Mode: Auditory Comprehension: 6-Follows complex conversation/direction: With extra time/assistive device  Expression Expression Mode: Verbal Expression: 5-Expresses basic 90% of the time/requires cueing < 10% of the time.  Social Interaction Social Interaction: 6-Interacts appropriately with others with medication or extra time (anti-anxiety, antidepressant).  Problem Solving Problem Solving: 5-Solves basic 90% of the time/requires cueing < 10% of the time  Memory Memory: 6-More than reasonable amt of time Medical Problem List and Plan:  1. 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 DVT Prophylaxis/Anticoagulation: Pharmaceutical: Xarelto  3. Pain Management: tylenol, robaxin, ice , and heat. She is unable to tolerate narcotics  -self reported hx of fibromyalgia.    4. Mood: patient is motivated and in good spirits  -maintain buspar and celexa, hs doxepin at current doses. -neuropsych assessment for anxiety 5. Neuropsych: This patient is capable of making decisions on her own behalf.  6. ABLA: iron supplementation, hgb recovering 7. Leukocytosis: temp resolved, wbc's improving -ua neg, culture neg, wound, remainder of exam benign 8. Urinary frequency. Low volume  -begin low dose ditropan, 5mg  bid    LOS (Days) 5 A FACE TO FACE EVALUATION WAS PERFORMED  [name] T 08/04/2013 8:24 AM

## 2013-08-04 NOTE — Progress Notes (Signed)
Social Work Patient ID: [name], female   DOB: 12/02/1950, 63 y.o.   MRN: [id number]  Have reviewed team conference with pt who is aware and agreeable with targeted d/c date of 7/24 with mod i goals.  Pt feels very pleased with progress made this week and denies any concerns about d/c.  Will arrange f/u [facility] and DME.  Mande Auvil, LCSW

## 2013-08-04 NOTE — Progress Notes (Signed)
Physical Therapy Session Note  Patient Details  Name: [name] MRN: [id number] Date of Birth: October 02, 1950  Today's Date: 08/04/2013 Time: 4315-4008 Time Calculation (min): 25 min  Short Term Goals: Week 1:  PT Short Term Goal 1 (Week 1): = LTGs due to anticipated LOS  Skilled Therapeutic Interventions/Progress Updates:    Pt received supine in bed, agreeable to participate in therapy. Session focused on wheelchair management. Pt ambulated 10' w/ RW w/ supervision to wheelchair, managed brakes and leg rests w/ supervision. Pt has been using 20x16 chair in room. Brought 18x18 chair to room and had pt try it. Width was better, but 16" depth was more appropriate. Communicated to pt's discharge planner that pt will benefit from an 18x16 chair for discharge home. Discussed mobility at home and educated pt on using wheelchair for functional mobility while building endurance back up. Pt ambulated 10' back to bed w/ RW w/ supervision, left supine in bed w/ ice pack on L hip w/ all needs within reach.  Therapy Documentation Precautions:  Precautions Precautions: Fall Precaution Comments: R AKA - no prosthesis Restrictions Weight Bearing Restrictions: No Other Position/Activity Restrictions:  WBAT LLE General: Amount of Missed PT Time (min): 5 Minutes Missed Time Reason: Patient fatigue Vital Signs: Therapy Vitals Temp: 98.3 F (36.8 C) Temp src: Oral Pulse Rate: 76 Resp: 18 BP: 105/62 mmHg Patient Position (if appropriate): Lying Oxygen Therapy SpO2: 97 % O2 Device: None (Room air) Pain: Pain Assessment Pain Assessment: Faces Faces Pain Scale: Hurts a little bit Pain Type: Surgical pain Pain Location: Hip Pain Orientation: Left Pain Descriptors / Indicators: Aching Pain Frequency: Intermittent Pain Onset: On-going Patients Stated Pain Goal: 0 Pain Intervention(s): Cold applied Multiple Pain Sites: No Mobility:   Locomotion : Ambulation Ambulation/Gait Assistance: 5:  Supervision  Trunk/Postural Assessment :    Balance:   Exercises:   Other Treatments:    See FIM for current functional status  Therapy/Group: Individual Therapy  Rada Hay Rada Hay, PT, DPT  08/04/2013, 4:50 PM

## 2013-08-05 ENCOUNTER — Inpatient Hospital Stay (HOSPITAL_COMMUNITY): Payer: BC Managed Care – PPO

## 2013-08-05 ENCOUNTER — Inpatient Hospital Stay (HOSPITAL_COMMUNITY): Payer: BC Managed Care – PPO | Admitting: Physical Therapy

## 2013-08-05 NOTE — Progress Notes (Signed)
Recreational Therapy Discharge Summary Patient Details  Name: ARIANIE COUSE MRN: [id number] Date of Birth: 01/16/1950 Today's Date: 08/05/2013  Long term goals set: 1  Long term goals met: 1  Comments on progress toward goals: Pt is discharging home at overall Mod I w/c level. Therapy sessions focused on discharge planning, leisure education/adaptations, identification & negotiation of obstacles.  Pt is optimistic about lifestyle changes & becoming more involved in activities of choice. Reasons for discharge: discharge from hospital Patient/family agrees with progress made and goals achieved: Yes  Efe Fazzino 08/05/2013, 3:48 PM

## 2013-08-05 NOTE — Progress Notes (Signed)
Physical Therapy Discharge Summary  Patient Details  Name: [name] MRN: [id number] Date of Birth: 1950-11-29  Today's Date: 08/05/2013 Time: 1115-1200 Time Calculation (min): 45 min  Patient has met 8 of 11 long term goals due to improved activity tolerance, improved balance, increased strength, decreased pain, ability to compensate for deficits and improved coordination.  Patient to discharge at a wheelchair level Modified Independent.   Patient's care partner is independent to provide the necessary physical assistance at discharge.  Reasons goals not met: Pt's gait distance is limited to 75 feet and pt did not complete a floor recovery transfer due to high pain ratings.   Recommendation:  Patient will benefit from ongoing skilled PT services in home health setting to continue to advance safe functional mobility, address ongoing impairments in functional mobility with LRAD, and minimize fall risk.  Equipment: 18x16 w/c and RW  Reasons for discharge: treatment goals met  Patient/family agrees with progress made and goals achieved: Yes  Therapeutic Interventions: Therapeutic Activity: Pt demonstrates bed mobility mod I supine to/from sit. Pt demonstrates mod I transfers bed to w/c, w/c to furniture. PT instructs pt in safe car transfer req SBA and verbal cues for technique and hand placement.   Gait Training: Pt demonstrates mod I ambulation with RW x 75', then req a seated rest break due to fatigue. PT instructs pt in ascending single step with RW forward and again backward req CGA for safety.   W/C Management: Pt demonstrates mod I w/c propulsion x 200' with B UE and ability to manage all w/c parts independently.   Pt has progressed well in all functional mobility and is safe to d/c home. Pt reports husband was assisting her with single step to enter home premorbidly and feels comfortable continuing to do so.      PT  Discharge Precautions/Restrictions Precautions Precautions: Fall Precaution Comments: R AKA - no prosthesis Restrictions Weight Bearing Restrictions: No Other Position/Activity Restrictions:  WBAT LLE Vital Signs   Pain Pain Assessment Pain Assessment: 0-10 Pain Score: 6  Pain Type: Surgical pain Pain Location: Knee Pain Orientation: Left Pain Descriptors / Indicators: Sharp;Shooting Pain Onset: On-going Patients Stated Pain Goal: 0 Pain Intervention(s): Rest;Distraction Multiple Pain Sites: No Vision/Perception  Vision - Assessment Eye Alignment: Within Functional Limits  Cognition Orientation Level: Oriented X4 Attention: Focused;Sustained Focused Attention: Appears intact Sustained Attention: Appears intact Problem Solving: Appears intact Safety/Judgment: Appears intact Sensation Sensation Light Touch: Impaired by gross assessment (L inguinal crease diminished to LT) Stereognosis: Appears Intact Hot/Cold: Appears Intact Proprioception: Appears Intact Coordination Gross Motor Movements are Fluid and Coordinated: Yes Fine Motor Movements are Fluid and Coordinated: Not tested Finger Nose Finger Test: intact Motor  Motor Motor: Other (comment) Motor - Discharge Observations: slight intention tremor; no tremor at rest   Mobility Bed Mobility Bed Mobility: Rolling Right;Rolling Left;Supine to Sit;Sit to Supine Rolling Right: 7: Independent Rolling Left: 7: Independent Supine to Sit: 6: Modified independent (Device/Increase time) Sit to Supine: 6: Modified independent (Device/Increase time) Transfers Transfers: Yes Sit to Stand: 6: Modified independent (Device/Increase time) Stand to Sit: 6: Modified independent (Device/Increase time) Locomotion  Ambulation Ambulation: Yes Ambulation/Gait Assistance: 6: Modified independent (Device/Increase time) Ambulation Distance (Feet): 75 Feet Assistive device: Rolling walker Gait Gait: Yes Gait Pattern:  Impaired Gait velocity: step-hop due to R AKA Stairs / Additional Locomotion Stairs: Yes Stairs Assistance: 4: Min guard Stairs Assistance Details: Tactile cues for weight shifting Stair Management Technique: No rails;With walker Number of Stairs: 1 Ramp:  Not tested (comment) Curb: Not tested (comment) Wheelchair Mobility Wheelchair Mobility: Yes Wheelchair Parts Management: Independent Distance: 200  Trunk/Postural Assessment  Cervical Assessment Cervical Assessment: Within Functional Limits Thoracic Assessment Thoracic Assessment: Within Functional Limits Lumbar Assessment Lumbar Assessment: Within Functional Limits Postural Control Postural Control: Within Functional Limits  Balance Balance Balance Assessed: Yes Static Sitting Balance Static Sitting - Balance Support: No upper extremity supported;Feet supported Static Sitting - Level of Assistance: 7: Independent Dynamic Sitting Balance Dynamic Sitting - Balance Support: No upper extremity supported;Feet supported Dynamic Sitting - Level of Assistance: 7: Independent (moderate excursions) Static Standing Balance Static Standing - Balance Support: Bilateral upper extremity supported;During functional activity Static Standing - Level of Assistance: 6: Modified independent (Device/Increase time) Dynamic Standing Balance Dynamic Standing - Balance Support: During functional activity;Bilateral upper extremity supported Dynamic Standing - Level of Assistance: 6: Modified independent (Device/Increase time) Dynamic Standing - Balance Activities: Reaching for objects Extremity Assessment  RUE Assessment RUE Assessment: Within Functional Limits LUE Assessment LUE Assessment: Within Functional Limits RLE Assessment RLE Assessment: Exceptions to [facility] RLE Strength RLE Overall Strength: Deficits;Due to premorbid status RLE Overall Strength Comments: R hip grossly 5/5; knee and ankle have been amputated LLE Assessment LLE  Assessment: Within Functional Limits LLE Strength LLE Overall Strength: Within Functional Limits for tasks assessed LLE Overall Strength Comments: grossly 5/5 - tremor with effort noted  See FIM for current functional status  [name] M 08/05/2013, 12:00 PM

## 2013-08-05 NOTE — Progress Notes (Signed)
Physical Therapy Session Note  Patient Details  Name: [name] MRN: [id number] Date of Birth: [birth year]  Today's Date: 08/05/2013 Time: 1224-8250  Time Calculation (min): 57 min  Short Term Goals: Week 1:  PT Short Term Goal 1 (Week 1): = LTGs due to anticipated LOS  Skilled Therapeutic Interventions/Progress Updates:  Pt. Received semi-reclined in bed and agreeable to therapy. Pt. States L hip discomfort "more like soreness"  1:1 Treatment focused on functional transfers, car transfer, stairs, ambulation and balance. Pt. Assist level is Mod-I with RW for overall activities, and Min A for car and stairs. Pt. Demonstrated increased functional ability with increasing endurance for RW ambulation distance of 100 feet with multiple therapeutic rest/recovery breaks.  Pain: 4/10 with RN made aware  Pt. Handoff to OT for next session.  Therapy Documentation Precautions:  Precautions Precautions: Fall Precaution Comments: R AKA - no prosthesis Restrictions Weight Bearing Restrictions: No Other Position/Activity Restrictions:  WBAT LLE   Locomotion : Ambulation Ambulation/Gait Assistance: 6: Modified independent (Device/Increase time) Wheelchair Mobility Distance: 150   See FIM for current functional status  Therapy/Group: Individual Therapy  Juluis Mire 08/05/2013, 3:58 PM

## 2013-08-05 NOTE — Progress Notes (Signed)
[facility] RE[facility]ILITATION     PROGRESS NOTE    Subjective/Complaints: Up in bathroom this am. Pain under control  Objective: Vital Signs: Blood pressure 106/62, pulse 80, temperature 98.1 F (36.7 C), temperature source Oral, resp. rate 19, height 5\' 2"  (1.575 m), weight 71.3 kg (157 lb 3 oz), SpO2 97.00%. No results found. No results found for this basename: WBC, HGB, HCT, PLT,  in the last 72 hours No results found for this basename: NA, K, CL, CO, GLUCOSE, BUN, CREATININE, CALCIUM,  in the last 72 hours CBG (last 3)  No results found for this basename: GLUCAP,  in the last 72 hours  Wt Readings from Last 3 Encounters:  08/04/13 71.3 kg (157 lb 3 oz)  07/28/13 69.854 kg (154 lb)  07/28/13 69.854 kg (154 lb)    Physical Exam:  Constitutional: She is oriented to person, place, and time. She appears well-developed and well-nourished.  HENT: oral mucosa pink and moist, dentition good  Head: Normocephalic and atraumatic.  Eyes: Conjunctivae are normal. Pupils are equal, round, and reactive to light.  Neck: Normal range of motion. Neck supple.  Cardiovascular: Normal rate and regular rhythm. No murmurs. Trace edema LLE Respiratory: Effort normal and breath sounds normal. No respiratory distress. She has no wheezes.  GI: Soft. Bowel sounds are normal. She exhibits no distension. There is no tenderness.  Musc: mild to moderate edema about the left thigh. Right AK stump with soft tissue/fat. Very short Neurological: She is alert and oriented to person, place, and time. No cranial nerve deficit. Motor 5/5 in BUE. 3-/5 Left hip flex, Knee ext, 5/5 Left ankle DF/PF. Right HF 4+/5.  Skin: Skin is warm and dry. Anterior hip incision is clean and dry with steristrips. Psychiatric: Her speech is normal and behavior is normal. Thought content normal. Her mood appears normal. Cognition and memory are normal.      Assessment/Plan: 1. Functional deficits secondary to OA  left hip s/p right THA which require 3+ hours per day of interdisciplinary therapy in a comprehensive inpatient rehab setting. Physiatrist is providing close team supervision and 24 hour management of active medical problems listed below. Physiatrist and rehab team continue to assess barriers to discharge/monitor patient progress toward functional and medical goals.  FIM: FIM - Bathing Bathing Steps Patient Completed: Chest;Right Arm;Left Arm;Abdomen;Front perineal area;Buttocks;Right upper leg;Left upper leg;Left lower leg (including foot) Bathing: 6: Assistive device (Comment)  FIM - Upper Body Dressing/Undressing Upper body dressing/undressing steps patient completed: Thread/unthread right bra strap;Thread/unthread left bra strap;Hook/unhook bra;Thread/unthread right sleeve of pullover shirt/dresss;Thread/unthread left sleeve of pullover shirt/dress;Put head through opening of pull over shirt/dress;Pull shirt over trunk Upper body dressing/undressing: 6: More than reasonable amount of time FIM - Lower Body Dressing/Undressing Lower body dressing/undressing steps patient completed: Thread/unthread right underwear leg;Thread/unthread left underwear leg;Pull underwear up/down;Thread/unthread right pants leg;Thread/unthread left pants leg;Pull pants up/down;Fasten/unfasten pants;Don/Doff right shoe Lower body dressing/undressing: 6: Assistive device (Comment)  FIM - Toileting Toileting steps completed by patient: Adjust clothing prior to toileting;Performs perineal hygiene;Adjust clothing after toileting Toileting Assistive Devices: Grab bar or rail for support Toileting: 5: Supervision: Safety issues/verbal cues  FIM - Radio producer Devices: Nurse, learning disability Transfers: 6-Assistive device: No helper;6-To toilet/ BSC;6-From toilet/BSC  FIM - Control and instrumentation engineer Devices: Copy: 6: Assistive device: no  helper;6: Sit > Supine: No assist;6: Chair or W/C > Bed: No assist  FIM - Locomotion: Wheelchair Distance: 150 Locomotion: Wheelchair: 5: [employer]  150 ft or more: maneuvers on rugs and over door sills with supervision, cueing or coaxing FIM - Locomotion: Ambulation Locomotion: Ambulation Assistive Devices: Walker - Rolling Ambulation/Gait Assistance: 5: Supervision Locomotion: Ambulation: 2: Travels 50 - 149 ft with supervision/safety issues  Comprehension Comprehension Mode: Auditory Comprehension: 6-Follows complex conversation/direction: With extra time/assistive device  Expression Expression Mode: Verbal Expression: 5-Expresses complex 90% of the time/cues < 10% of the time  Social Interaction Social Interaction: 6-Interacts appropriately with others with medication or extra time (anti-anxiety, antidepressant).  Problem Solving Problem Solving: 5-Solves basic 90% of the time/requires cueing < 10% of the time  Memory Memory: 6-More than reasonable amt of time Medical Problem List and Plan:  1. 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 DVT Prophylaxis/Anticoagulation: Pharmaceutical: Xarelto  3. Pain Management: tylenol, robaxin, ice , and heat. She is unable to tolerate narcotics  -self reported hx of fibromyalgia.    4. Mood: patient is motivated and in good spirits  -maintain buspar and celexa, hs doxepin at current doses. -neuropsych assessment for anxiety--results pending 5. Neuropsych: This patient is capable of making decisions on her own behalf.  6. ABLA: iron supplementation, hgb recovering 7. Leukocytosis: temp resolved, wbc's improving -ua neg, culture neg, wound, remainder of exam benign 8. Urinary frequency. Low volume---?sl decreased yesterday  -began low dose ditropan, 5mg  bid    LOS (Days) 6 A FACE TO FACE EVALUATION WAS PERFORMED  SWARTZ,ZACHARY T 08/05/2013 8:14 AM

## 2013-08-05 NOTE — Progress Notes (Signed)
Pt refuses to wear CPAP tonight. Pt in no distress. Pt encouraged to call RT if pt changes mind.

## 2013-08-05 NOTE — Progress Notes (Signed)
Occupational Therapy Session Note  Patient Details  Name: [name] MRN: [id number] Date of Birth: 05-Nov-1950  Today's Date: 08/05/2013 Time: 0930-1030 Time Calculation (min): 60 min  Short Term Goals: Week 1:  OT Short Term Goal 1 (Week 1): STG=LTG  Skilled Therapeutic Interventions/Progress Updates: ADL-retraining at shower level with focus on discharge planning, use of AE/DME, and adapted lower body dressing skills.   Patient received seated in w/c and able to perform transfer and mobility to bathroom with distant supervision this session.   Pt retrieved and prepared clothing at edge of bed and proceeded to bathroom using RW to complete bathing sitting on tub bench without LOB or evidence of fatigue during mobility.   Pt completed bath and required only setup assist to dry water off floor after bathing to allow safe mobility back to edge of bed.   Pt dressed upper body unassisted but required one cue as reminder to dress lower body, supine if unable to stand due to fatigue after long shower.   Pt completed lower body dressing but requested assist to don TED d/t left hip/knee tightness.   OT reviewed need for DME and re-educated pt on use of tub bench using lateral leans for buttocks as pt is unable to perform squat-stand using grab bar on tub bench.      Therapy Documentation Precautions:  Precautions Precautions: Fall Precaution Comments: R AKA - no prosthesis Restrictions Weight Bearing Restrictions: No Other Position/Activity Restrictions:  WBAT LLE  Pain: Pain Assessment Pain Assessment: 0-10 Pain Score: 5  Pain Type: Surgical pain Pain Orientation: Left Pain Descriptors / Indicators: Sharp Pain Onset: With Activity Patients Stated Pain Goal: 0 Pain Intervention(s): Repositioned  See FIM for current functional status  Therapy/Group: Individual Therapy  Second session: Time: 6381-7711 Time Calculation (min):  30 min  Pain Assessment: No/denies pain  Skilled  Therapeutic Interventions: Therapeutic exercises with focus on upper body/shoulder girdle strengthening using theraband (orange, grade II).   With hand guidance to perform exercises correctly pt complete 10 reps each of  chest press, seated row, scapular pull downs, scapular chest pulls as directed and as demonstrated in written literature provided.   Pt expended all time available for therex due to need for correction to performance of exercise without completing bicep curls or tricep extension exercises but observed demonstration with reference to literature provided.   See FIM for current functional status  Therapy/Group: Individual Therapy  Gisela Lea 08/05/2013, 10:30 AM

## 2013-08-06 DIAGNOSIS — D72829 Elevated white blood cell count, unspecified: Secondary | ICD-10-CM | POA: Diagnosis present

## 2013-08-06 DIAGNOSIS — D62 Acute posthemorrhagic anemia: Secondary | ICD-10-CM

## 2013-08-06 DIAGNOSIS — M169 Osteoarthritis of hip, unspecified: Secondary | ICD-10-CM

## 2013-08-06 DIAGNOSIS — M1610 Unilateral primary osteoarthritis, unspecified hip: Secondary | ICD-10-CM

## 2013-08-06 DIAGNOSIS — IMO0001 Reserved for inherently not codable concepts without codable children: Secondary | ICD-10-CM

## 2013-08-06 DIAGNOSIS — Z96649 Presence of unspecified artificial hip joint: Secondary | ICD-10-CM

## 2013-08-06 MED ORDER — POLYSACCHARIDE IRON COMPLEX 150 MG PO CAPS: 150.0000 mg | ORAL_CAPSULE | Freq: Two times a day (BID) | ORAL | Status: DC

## 2013-08-06 MED ORDER — HYDROMORPHONE HCL 2 MG PO TABS: ORAL_TABLET | ORAL | Status: DC

## 2013-08-06 MED ORDER — POLYETHYLENE GLYCOL 3350 17 G PO PACK: 17.0000 g | PACK | Freq: Two times a day (BID) | ORAL | Status: AC

## 2013-08-06 MED ORDER — TRAMADOL HCL 50 MG PO TABS: 50.0000 mg | ORAL_TABLET | Freq: Four times a day (QID) | ORAL | Status: DC | PRN

## 2013-08-06 MED ORDER — OXYBUTYNIN CHLORIDE 5 MG PO TABS: 5.0000 mg | ORAL_TABLET | Freq: Two times a day (BID) | ORAL | Status: DC

## 2013-08-06 MED ORDER — RIVAROXABAN 10 MG PO TABS: ORAL_TABLET | ORAL | Status: DC

## 2013-08-06 NOTE — Progress Notes (Signed)
Patient discharged home.  Left floor via wheelchair, escorted by nursing staff and spouse.  All patient belongings taken with patient including DME.  Verbalized understanding of discharge instructions as given by Algis Liming, PA, no questions asked.  Appears to be in no immediate distress at this time.  Brita Romp, RN

## 2013-08-06 NOTE — Progress Notes (Signed)
Social Work Discharge Note Discharge Note  The overall goal for the admission was met for:   Discharge location: Yes-HOME WITH HUSBAND WHO CAN BE THERE 24 HR SHORT TIME-THEN RETURN TO WORK  Length of Stay: Yes-8 DAYS  Discharge activity level: Yes-MOD/I-SUPERVISION COMMUNITY  Home/community participation: Yes  Services provided included: MD, RD, PT, OT, RN, CM, TR, Pharmacy and SW  Financial Services: Private Insurance: [place]  Follow-up services arranged: Home Health: [facility], DME: [place], [facility] and Patient/Family has no preference for HH/DME agencies  Comments (or additional information):FAILY [place]  Patient/Family verbalized understanding of follow-up arrangements: Yes  Individual responsible for coordination of the follow-up plan: SELF & JERRY-HUSBAND  Confirmed correct DME delivered: Elease Hashimoto 08/06/2013    Elease Hashimoto

## 2013-08-06 NOTE — Discharge Summary (Signed)
Physician Discharge Summary  Patient ID: [name] MRN: [id number] DOB/AGE: 1950-09-06 63 y.o.  Admit date: 07/30/2013 Discharge date: 08/06/2013  Discharge Diagnoses:  Principal Problem:   Osteoarthritis of hip Active Problems:   Postoperative anemia due to acute blood loss   Depression   Obstructive sleep apnea   Leucocytosis--reactive   Discharged Condition: Stable.    Labs:  Basic Metabolic Panel:  Recent Labs Lab 08/02/13 0620  NA 144  K 4.4  CL 106  CO2 28  GLUCOSE 112*  BUN 17  CREATININE 0.87  CALCIUM 8.7    CBC: CBC Latest Ref Rng 07/31/2013 07/30/2013 07/29/2013  WBC 4.0 - 10.5 K/uL 14.4(H) 20.5(H) 16.4(H)  Hemoglobin 12.0 - 15.0 g/dL 8.3(L) 8.2(L) 8.9(L)  Hematocrit 36.0 - 46.0 % 25.4(L) 24.6(L) 27.0(L)  Platelets 150 - 400 K/uL 252 262 264     CBG: No results found for this basename: GLUCAP,  in the last 168 hours  Brief HPI:   [name] is a 63 y.o. 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 She has failed conservative measures and elected to undergo L-THR by Dr. Wynelle Link on 07/28/13. Post op with leucocytosis as well as ABLA. She is WBAT and on Xarelto for DVT prophylaxis. Therapy initiated and CIR was recommended by rehab team as well as MD.     Hospital Course: DESIRAI TRAXLER was admitted to rehab 07/30/2013 for inpatient therapies to consist of PT, ST and OT at least three hours five days a week. Past admission physiatrist, therapy team and rehab RN have worked together to provide customized collaborative inpatient rehab. Her hip incision has healed well without signs or symptoms of infection. Pain control is improving with use of dilaudid on prn basis as well as tramadol once daily. Narcotic induced  constipation was managed with use of miralax on bid basis.  Blood pressures have been well controlled and ABLA is stable on iron supplement. Reactive leucocytosis is resolving and no signs of infection noted. Urine culture was negative. She has had acute on chronic bladder urgency worsened by her immobility. She was started on low dose ditropan and was educated on scheduled toileting to help with symptom management. She has been non-compliant with CPAP use during this hospitalization but reports that she plans on resuming this at home. Mood has been stable and her outlook has improved with increase in activity level.  She has made good progress with therapy and is independent in household setting. She will continue to receive HHPT by [facility] past discharge. She was discharged to home on 08/06/13 in improved condition.   Rehab course: During patient's stay in rehab weekly team conferences were held to monitor patient's progress, set goals and discuss barriers to discharge. Patient has had improvement in activity tolerance, balance, postural control, as well as ability to compensate for deficits. She is able to complete bathing and dressing tasks with supervision and set up assist. She requires assistance to don TEDs. She is modified independent for transfers and is able to ambulate 100 feet with RW and multiple rest breaks. She requires supervision in community setting. She requires min assist with stair navigation as well as car transfers.    Disposition: 01-Home or Self Care  Diet: Regular  Special Instructions: 1. Wash incision with soap and water. Keep clean and dry. No ointments or lotions.  2. Limit tramadol to one daily to avoid serotonin syndrome. To wean off doxepin over next few days.      Medication List    STOP taking these medications       acetaminophen 325 MG tablet  Commonly known as:  TYLENOL     methocarbamol 500 MG tablet  Commonly known as:  ROBAXIN      metoCLOPramide 5 MG tablet  Commonly known as:  REGLAN     ondansetron 4 MG tablet  Commonly known as:  ZOFRAN     oxyCODONE 5 MG immediate release tablet  Commonly known as:  Oxy IR/ROXICODONE     predniSONE 50 MG tablet  Commonly known as:  DELTASONE      TAKE these medications       albuterol 108 (90 BASE) MCG/ACT inhaler  Commonly known as:  PROVENTIL HFA;VENTOLIN HFA  Inhale 2 puffs into the lungs every 6 (six) hours as needed for wheezing or shortness of breath.     bisacodyl 10 MG suppository  Commonly known as:  DULCOLAX  Place 1 suppository (10 mg total) rectally daily as needed for mild constipation or moderate constipation.     buPROPion 150 MG 12 hr tablet  Commonly known as:  WELLBUTRIN SR  Take 150 mg by mouth 2 (two) times daily.     busPIRone 10 MG tablet  Commonly known as:  BUSPAR  Take 10 mg by mouth 2 (two) times daily.     citalopram 10 MG tablet  Commonly known as:  CELEXA  Take 10 mg by mouth every morning.     cyclobenzaprine 10 MG tablet  Commonly known as:  FLEXERIL  Take 10 mg by mouth 2 (two) times daily.     diphenhydrAMINE 25 MG tablet  Commonly known as:  BENADRYL  Take 1 tablet (25 mg total) by mouth every 6 (six) hours as needed for itching.     doxepin 10 MG capsule  Commonly known as:  SINEQUAN  Take 10 mg by mouth at bedtime.     DSS 100 MG Caps  Take 100 mg by mouth 2 (two) times daily.     esomeprazole 20 MG capsule  Commonly known as:  NEXIUM  Take 1 capsule (20 mg total) by mouth 2 (two) times daily before a meal.     fexofenadine 180 MG tablet  Commonly known as:  ALLEGRA  Take 180 mg by mouth daily.     fluocinolone 0.01 % external solution  Commonly known as:  SYNALAR  Place 2 drops into both ears 2 (two) times daily as needed (itching).     HYDROmorphone 2 MG tablet--Rx # 30 pills  Commonly known as:  DILAUDID  Use one pill every 8 hours as needed for severe pain     irbesartan 150 MG tablet  Commonly known  as:  AVAPRO  Take 150 mg by mouth 2 (two) times daily.     iron polysaccharides 150 MG capsule  Commonly known as:  NIFEREX  Take 1 capsule (150 mg total) by mouth 2 (two) times daily.     oxybutynin 5 MG tablet  Commonly known as:  DITROPAN  Take 1 tablet (5 mg total) by mouth 2 (two) times daily. For hyperactive bladder     polyethylene glycol packet  Commonly known as:  MIRALAX / GLYCOLAX  Take 17 g by mouth 2 (  two) times daily.     rivaroxaban 10 MG Tabs tablet  Commonly known as:  XARELTO  - Take one pill daily. -Take Xarelto for one and a half more weeks, then discontinue Xarelto. - Once the patient has completed the blood thinner regimen, then take a Baby 81 mg Aspirin daily for three more weeks.     traMADol 50 MG tablet  Commonly known as:  ULTRAM  Take 1 tablet (50 mg total) by mouth every 6 (six) hours as needed for moderate pain.       Follow-up Information   Follow up with Shary Key, MD On 08/24/2013. (@ 1:30 pm)    Specialty:  Family Medicine      Follow up with [name], MD. Call today. (for appointment)    Specialty:  Orthopedic Surgery   Contact information:   [address] [place] [address] [phone]       Call Meredith Staggers, MD. (As needed)    Specialty:  Physical Medicine and Rehabilitation   Contact information:   [address], [place] [address] [phone]       Signed: Bary Leriche 08/06/2013, 3:11 PM

## 2013-08-06 NOTE — Progress Notes (Signed)
Occupational Therapy Discharge Summary  Patient Details  Name: [name] MRN: [id number] Date of Birth: [birth year]  Today's Date: 08/06/2013  Patient has met 9 of 9 long term goals due to improved balance, ability to compensate for deficits and improved awareness.  Patient to discharge at overall Modified Independent level.  Patient's care partner is independent to provide the necessary assistance at discharge.    Reasons goals not met:n/a  Recommendation:  Patient will not require ongoing skilled OT services.  Equipment: Tub bench  Reasons for discharge: treatment goals met  Patient/family agrees with progress made and goals achieved: Yes  OT Discharge Precautions/Restrictions  Precautions Precautions: Fall Precaution Comments: R AKA - no prosthesis Restrictions Weight Bearing Restrictions: No Other Position/Activity Restrictions:  WBAT LLE Pain Pain Assessment Pain Assessment: No/denies pain ADL ADL ADL Comments: see FIM Vision/Perception  Vision- History Baseline Vision/History: No visual deficits;Wears glasses Wears Glasses: At all times Vision- Assessment Eye Alignment: Within Functional Limits  Cognition Overall Cognitive Status: Within Functional Limits for tasks assessed Orientation Level: Oriented X4 Attention: Alternating Alternating Attention: Appears intact Memory: Appears intact Awareness: Appears intact Problem Solving: Appears intact Safety/Judgment: Appears intact Sensation Sensation Light Touch: Impaired by gross assessment (diminished sensation at LLE) Stereognosis: Appears Intact Hot/Cold: Appears Intact Proprioception: Appears Intact Coordination Gross Motor Movements are Fluid and Coordinated: Yes Fine Motor Movements are Fluid and Coordinated: Yes (mild intention tremors do not effect performance of ADL) Motor  Motor Motor: Other (comment) Motor - Discharge Observations: slight intention tremor; no tremor at rest  Mobility   Bed Mobility Bed Mobility: Rolling Right;Rolling Left;Supine to Sit;Sit to Supine Rolling Right: 7: Independent Rolling Left: 7: Independent Supine to Sit: 6: Modified independent (Device/Increase time) Sit to Supine: 6: Modified independent (Device/Increase time) Transfers Sit to Stand: 6: Modified independent (Device/Increase time) Stand to Sit: 6: Modified independent (Device/Increase time)  Trunk/Postural Assessment  Cervical Assessment Cervical Assessment: Within Functional Limits Thoracic Assessment Thoracic Assessment: Within Functional Limits Lumbar Assessment Lumbar Assessment: Within Functional Limits Postural Control Postural Control: Within Functional Limits  Balance Balance Balance Assessed: Yes Static Sitting Balance Static Sitting - Level of Assistance: 7: Independent Dynamic Sitting Balance Dynamic Sitting - Level of Assistance: 7: Independent Static Standing Balance Static Standing - Balance Support: Bilateral upper extremity supported;During functional activity Static Standing - Level of Assistance: 6: Modified independent (Device/Increase time) Dynamic Standing Balance Dynamic Standing - Balance Support: During functional activity;Bilateral upper extremity supported Dynamic Standing - Level of Assistance: 6: Modified independent (Device/Increase time) Dynamic Standing - Balance Activities: Reaching for objects Extremity/Trunk Assessment RUE Assessment RUE Assessment: Within Functional Limits LUE Assessment LUE Assessment: Within Functional Limits  See FIM for current functional status  [name] 08/06/2013, 4:03 PM

## 2013-08-06 NOTE — Discharge Instructions (Signed)
Inpatient Rehab Discharge Instructions  [name] Discharge date and time: 08/06/13   Activities/Precautions/ Functional Status: Activity: activity as tolerated Diet: regular diet Wound Care: keep wound clean and dry Functional status:  ___ No restrictions     ___ Walk up steps independently ___ 24/7 supervision/assistance   ___ Walk up steps with assistance _X__ Intermittent supervision/assistance  _X__ Bathe/dress independently _X__ Walk with walker    ___ Bathe/dress with assistance ___ Walk Independently    ___ Shower independently ___ Walk with assistance    ___ Shower with assistance _X__ No alcohol     ___ Return to work/school ________   COMMUNITY REFERRALS UPON DISCHARGE:    Home Health:   PT                      Agency: [facility] Phone: [phone]    Medical Equipment/Items Ordered: wheelchair, cushion, walker, tub bench                                                     Agency/Supplier: [facility] [phone]      Special Instructions:    My questions have been answered and I understand these instructions. I will adhere to these goals and the provided educational materials after my discharge from the hospital.  Patient/Caregiver Signature _______________________________ Date __________  Clinician Signature _______________________________________ Date __________  Please bring this form and your medication list with you to all your follow-up doctor's appointments.

## 2013-08-06 NOTE — Progress Notes (Signed)
[facility] PHYSICAL MEDICINE & REHABILITATION     PROGRESS NOTE    Subjective/Complaints: Having some spasms in thigh today. Otherwise excited to go home. A  review of systems has been performed and if not noted above is otherwise negative.   Objective: Vital Signs: Blood pressure 106/62, pulse 85, temperature 97.8 F (36.6 C), temperature source Oral, resp. rate 18, height '5\' 2"'  (1.575 m), weight 71.3 kg (157 lb 3 oz), SpO2 97.00%. No results found. No results found for this basename: WBC, HGB, HCT, PLT,  in the last 72 hours No results found for this basename: NA, K, CL, CO, GLUCOSE, BUN, CREATININE, CALCIUM,  in the last 72 hours CBG (last 3)  No results found for this basename: GLUCAP,  in the last 72 hours  Wt Readings from Last 3 Encounters:  08/04/13 71.3 kg (157 lb 3 oz)  07/28/13 69.854 kg (154 lb)  07/28/13 69.854 kg (154 lb)    Physical Exam:  Constitutional: She is oriented to person, place, and time. She appears well-developed and well-nourished.  HENT: oral mucosa pink and moist, dentition good  Head: Normocephalic and atraumatic.  Eyes: Conjunctivae are normal. Pupils are equal, round, and reactive to light.  Neck: Normal range of motion. Neck supple.  Cardiovascular: Normal rate and regular rhythm. No murmurs. Trace edema LLE Respiratory: Effort normal and breath sounds normal. No respiratory distress. She has no wheezes.  GI: Soft. Bowel sounds are normal. She exhibits no distension. There is no tenderness.  Musc: mild to moderate edema about the left thigh. Right AK stump with soft tissue/fat. Very short Neurological: She is alert and oriented to person, place, and time. No cranial nerve deficit. Motor 5/5 in BUE. 3/5 Left hip flex, Knee ext 4/5, 5/5 Left ankle DF/PF. Right HF 4+/5.  Skin: Skin is warm and dry. Anterior hip incision is clean and dry, intact Psychiatric: Her speech is normal and behavior is normal. Thought content normal. Her mood appears  normal. Cognition and memory are normal.      Assessment/Plan: 1. Functional deficits secondary to OA left hip s/p right THA which require 3+ hours per day of interdisciplinary therapy in a comprehensive inpatient rehab setting. Physiatrist is providing close team supervision and 24 hour management of active medical problems listed below. Physiatrist and rehab team continue to assess barriers to discharge/monitor patient progress toward functional and medical goals.  Dc home today. Goals met. Follow up arranged.  FIM: FIM - Bathing Bathing Steps Patient Completed: Chest;Right Arm;Left Arm;Abdomen;Front perineal area;Buttocks;Right upper leg;Left upper leg;Left lower leg (including foot) Bathing: 6: Assistive device (Comment)  FIM - Upper Body Dressing/Undressing Upper body dressing/undressing steps patient completed: Thread/unthread right bra strap;Thread/unthread left bra strap;Hook/unhook bra;Thread/unthread right sleeve of pullover shirt/dresss;Thread/unthread left sleeve of pullover shirt/dress;Put head through opening of pull over shirt/dress;Pull shirt over trunk Upper body dressing/undressing: 6: More than reasonable amount of time FIM - Lower Body Dressing/Undressing Lower body dressing/undressing steps patient completed: Thread/unthread right underwear leg;Thread/unthread left underwear leg;Pull underwear up/down;Thread/unthread right pants leg;Thread/unthread left pants leg;Pull pants up/down;Fasten/unfasten pants;Don/Doff right shoe Lower body dressing/undressing: 6: Assistive device (Comment)  FIM - Toileting Toileting steps completed by patient: Adjust clothing prior to toileting;Performs perineal hygiene;Adjust clothing after toileting Toileting Assistive Devices: Grab bar or rail for support Toileting: 5: Supervision: Safety issues/verbal cues  FIM - Radio producer Devices: Nurse, learning disability Transfers: 6-Assistive device: No  helper;6-To toilet/ BSC;6-From toilet/BSC  FIM - Control and instrumentation engineer Devices: Environmental consultant;Arm rests  Bed/Chair Transfer: 6: Supine > Sit: No assist;6: Sit > Supine: No assist;6: Bed > Chair or W/C: No assist;6: Chair or W/C > Bed: No assist  FIM - Locomotion: Wheelchair Distance: 150 Locomotion: Wheelchair: 5: Travels 150 ft or more: maneuvers on rugs and over door sills with supervision, cueing or coaxing FIM - Locomotion: Ambulation Locomotion: Ambulation Assistive Devices: Administrator Ambulation/Gait Assistance: 6: Modified independent (Device/Increase time) Locomotion: Ambulation: 2: Travels 50 - 149 ft with supervision/safety issues  Comprehension Comprehension Mode: Auditory Comprehension: 6-Follows complex conversation/direction: With extra time/assistive device  Expression Expression Mode: Verbal Expression: 6-Expresses complex ideas: With extra time/assistive device  Social Interaction Social Interaction: 6-Interacts appropriately with others with medication or extra time (anti-anxiety, antidepressant).  Problem Solving Problem Solving: 5-Solves basic problems: With no assist  Memory Memory: 6-More than reasonable amt of time Medical Problem List and Plan:  1. 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 DVT Prophylaxis/Anticoagulation: Pharmaceutical: Xarelto  3. Pain Management: tylenol, robaxin, ice , and heat. Dilaudid prn  -self reported hx of fibromyalgia.    4. Mood: patient is motivated and in good spirits  - buspar and celexa, hs doxepin at current doses will continue at home. . -  5. Neuropsych: This patient is capable of making decisions on her own behalf.  6. ABLA: iron supplementation, hgb recovering 7. Leukocytosis: temp resolved, wbc's improving -ua neg, culture neg, wound, remainder of exam benign 8. Urinary frequency. Low volume--  -began low  dose ditropan, 77m bid with some improvement    LOS (Days) 7 A FACE TO FACE EVALUATION WAS PERFORMED  Travarius Lange T 08/06/2013 7:49 AM

## 2013-08-12 ENCOUNTER — Telehealth (HOSPITAL_COMMUNITY): Payer: Self-pay

## 2013-08-12 NOTE — Progress Notes (Signed)
Received call yesterday from [facility] reporting that pt had suffered a fall over the weekend and the, per xray, they were informed that she had suffered a "hairline fracture" in her hip and now needs a hospital bed at home.  I have discussed with Dr. Naaman Plummer who has written an order for bed and I have called referral to [facility].  Hope to have bed delivered today to pt's home.  Have contacted pt's husband to inform - very appreciative and agreeable.  Adarryl Goldammer, LCSW

## 2013-08-16 ENCOUNTER — Telehealth: Payer: Self-pay

## 2013-08-16 NOTE — Telephone Encounter (Signed)
Santiago Glad @ [facility] is requesting a verbal order for a SW to help patient with community resources. Contacted Santiago Glad to give her a verbal okay for a SW.

## 2013-08-19 ENCOUNTER — Inpatient Hospital Stay (HOSPITAL_COMMUNITY)
Admission: AD | Admit: 2013-08-19 | Discharge: 2013-08-27 | DRG: 481 | Disposition: A | Discharge status: Skilled Nursing Facility | Payer: BC Managed Care – PPO | Source: Ambulatory Visit | Attending: Orthopedic Surgery | Admitting: Orthopedic Surgery

## 2013-08-19 ENCOUNTER — Other Ambulatory Visit: Payer: Self-pay | Admitting: Surgical

## 2013-08-19 ENCOUNTER — Encounter (HOSPITAL_COMMUNITY): Payer: Self-pay | Admitting: *Deleted

## 2013-08-19 DIAGNOSIS — J45909 Unspecified asthma, uncomplicated: Secondary | ICD-10-CM | POA: Diagnosis present

## 2013-08-19 DIAGNOSIS — F329 Major depressive disorder, single episode, unspecified: Secondary | ICD-10-CM | POA: Diagnosis present

## 2013-08-19 DIAGNOSIS — E871 Hypo-osmolality and hyponatremia: Secondary | ICD-10-CM

## 2013-08-19 DIAGNOSIS — Z8781 Personal history of (healed) traumatic fracture: Secondary | ICD-10-CM

## 2013-08-19 DIAGNOSIS — Z6828 Body mass index (BMI) 28.0-28.9, adult: Secondary | ICD-10-CM | POA: Diagnosis not present

## 2013-08-19 DIAGNOSIS — G609 Hereditary and idiopathic neuropathy, unspecified: Secondary | ICD-10-CM | POA: Diagnosis present

## 2013-08-19 DIAGNOSIS — S78119A Complete traumatic amputation at level between unspecified hip and knee, initial encounter: Secondary | ICD-10-CM

## 2013-08-19 DIAGNOSIS — M25559 Pain in unspecified hip: Secondary | ICD-10-CM | POA: Diagnosis present

## 2013-08-19 DIAGNOSIS — K219 Gastro-esophageal reflux disease without esophagitis: Secondary | ICD-10-CM | POA: Diagnosis present

## 2013-08-19 DIAGNOSIS — I10 Essential (primary) hypertension: Secondary | ICD-10-CM | POA: Diagnosis present

## 2013-08-19 DIAGNOSIS — K449 Diaphragmatic hernia without obstruction or gangrene: Secondary | ICD-10-CM | POA: Diagnosis present

## 2013-08-19 DIAGNOSIS — F3289 Other specified depressive episodes: Secondary | ICD-10-CM | POA: Diagnosis present

## 2013-08-19 DIAGNOSIS — K5900 Constipation, unspecified: Secondary | ICD-10-CM | POA: Diagnosis present

## 2013-08-19 DIAGNOSIS — G4730 Sleep apnea, unspecified: Secondary | ICD-10-CM | POA: Diagnosis present

## 2013-08-19 DIAGNOSIS — Y831 Surgical operation with implant of artificial internal device as the cause of abnormal reaction of the patient, or of later complication, without mention of misadventure at the time of the procedure: Secondary | ICD-10-CM | POA: Diagnosis present

## 2013-08-19 DIAGNOSIS — F411 Generalized anxiety disorder: Secondary | ICD-10-CM | POA: Diagnosis present

## 2013-08-19 DIAGNOSIS — D62 Acute posthemorrhagic anemia: Secondary | ICD-10-CM | POA: Diagnosis present

## 2013-08-19 DIAGNOSIS — Z96649 Presence of unspecified artificial hip joint: Secondary | ICD-10-CM

## 2013-08-19 DIAGNOSIS — Z9889 Other specified postprocedural states: Secondary | ICD-10-CM

## 2013-08-19 DIAGNOSIS — Z9660 Presence of unspecified orthopedic joint implant: Secondary | ICD-10-CM | POA: Diagnosis present

## 2013-08-19 DIAGNOSIS — Z9289 Personal history of other medical treatment: Secondary | ICD-10-CM

## 2013-08-19 DIAGNOSIS — T84049A Periprosthetic fracture around unspecified internal prosthetic joint, initial encounter: Principal | ICD-10-CM | POA: Diagnosis present

## 2013-08-19 DIAGNOSIS — M9702XA Periprosthetic fracture around internal prosthetic left hip joint, initial encounter: Secondary | ICD-10-CM

## 2013-08-19 DIAGNOSIS — Z87891 Personal history of nicotine dependence: Secondary | ICD-10-CM | POA: Diagnosis not present

## 2013-08-19 DIAGNOSIS — M549 Dorsalgia, unspecified: Secondary | ICD-10-CM

## 2013-08-19 DIAGNOSIS — D649 Anemia, unspecified: Secondary | ICD-10-CM

## 2013-08-19 HISTORY — DX: Dorsalgia, unspecified: M54.9

## 2013-08-19 HISTORY — DX: Asymptomatic menopausal state: Z78.0

## 2013-08-19 HISTORY — DX: Personal history of pneumonia (recurrent): Z87.01

## 2013-08-19 HISTORY — DX: Personal history of other infectious and parasitic diseases: Z86.19

## 2013-08-19 HISTORY — DX: Personal history of other diseases of the respiratory system: Z87.09

## 2013-08-19 HISTORY — DX: Polyneuropathy, unspecified: G62.9

## 2013-08-19 HISTORY — DX: Other specified postprocedural states: Z98.890

## 2013-08-19 HISTORY — DX: Diaphragmatic hernia without obstruction or gangrene: K44.9

## 2013-08-19 HISTORY — DX: Nausea with vomiting, unspecified: R11.2

## 2013-08-19 LAB — CBC
HCT: 26.8 % — ABNORMAL LOW (ref 36.0–46.0)
Hemoglobin: 8.5 g/dL — ABNORMAL LOW (ref 12.0–15.0)
MCH: 29.3 pg (ref 26.0–34.0)
MCHC: 31.7 g/dL (ref 30.0–36.0)
MCV: 92.4 fL (ref 78.0–100.0)
Platelets: 538 10*3/uL — ABNORMAL HIGH (ref 150–400)
RBC: 2.9 MIL/uL — ABNORMAL LOW (ref 3.87–5.11)
RDW: 13.3 % (ref 11.5–15.5)
WBC: 13.4 10*3/uL — ABNORMAL HIGH (ref 4.0–10.5)

## 2013-08-19 LAB — COMPREHENSIVE METABOLIC PANEL
ALT: 15 U/L (ref 0–35)
AST: 25 U/L (ref 0–37)
Albumin: 3 g/dL — ABNORMAL LOW (ref 3.5–5.2)
Alkaline Phosphatase: 118 U/L — ABNORMAL HIGH (ref 39–117)
Anion gap: 12 (ref 5–15)
BUN: 17 mg/dL (ref 6–23)
CO2: 24 mEq/L (ref 19–32)
Calcium: 9.3 mg/dL (ref 8.4–10.5)
Chloride: 100 mEq/L (ref 96–112)
Creatinine, Ser: 0.92 mg/dL (ref 0.50–1.10)
GFR calc Af Amer: 76 mL/min — ABNORMAL LOW (ref >90)
GFR calc non Af Amer: 65 mL/min — ABNORMAL LOW (ref >90)
Glucose, Bld: 112 mg/dL — ABNORMAL HIGH (ref 70–99)
Potassium: 4.5 mEq/L (ref 3.7–5.3)
Sodium: 136 mEq/L — ABNORMAL LOW (ref 137–147)
Total Bilirubin: <0.2 mg/dL — ABNORMAL LOW (ref 0.3–1.2)
Total Protein: 7 g/dL (ref 6.0–8.3)

## 2013-08-19 LAB — PROTIME-INR
INR: 1.11 (ref 0.00–1.49)
Prothrombin Time: 14.3 seconds (ref 11.6–15.2)

## 2013-08-19 LAB — APTT: aPTT: 39 seconds — ABNORMAL HIGH (ref 24–37)

## 2013-08-19 MED ORDER — ACETAMINOPHEN 650 MG RE SUPP: 650.0000 mg | Freq: Four times a day (QID) | RECTAL | Status: DC | PRN

## 2013-08-19 MED ORDER — CYCLOBENZAPRINE HCL 10 MG PO TABS
10.0000 mg | ORAL_TABLET | Freq: Three times a day (TID) | ORAL | Status: DC | PRN
  Administered 2013-08-20 – 2013-08-27 (×13): 10 mg via ORAL
  Filled 2013-08-19 (×13): qty 1

## 2013-08-19 MED ORDER — BISACODYL 10 MG RE SUPP: 10.0000 mg | Freq: Every day | RECTAL | Status: DC | PRN

## 2013-08-19 MED ORDER — POLYETHYLENE GLYCOL 3350 17 G PO PACK
17.0000 g | PACK | Freq: Two times a day (BID) | ORAL | Status: DC
  Administered 2013-08-20 – 2013-08-27 (×10): 17 g via ORAL
  Filled 2013-08-19: qty 1

## 2013-08-19 MED ORDER — ESOMEPRAZOLE MAGNESIUM 20 MG PO CPDR
20.0000 mg | DELAYED_RELEASE_CAPSULE | Freq: Two times a day (BID) | ORAL | Status: DC
  Administered 2013-08-20 – 2013-08-27 (×14): 20 mg via ORAL
  Filled 2013-08-19 (×17): qty 1

## 2013-08-19 MED ORDER — ALBUTEROL SULFATE (2.5 MG/3ML) 0.083% IN NEBU: 2.5000 mg | INHALATION_SOLUTION | Freq: Four times a day (QID) | RESPIRATORY_TRACT | Status: DC | PRN

## 2013-08-19 MED ORDER — ONDANSETRON HCL 4 MG PO TABS: 4.0000 mg | ORAL_TABLET | Freq: Four times a day (QID) | ORAL | Status: DC | PRN

## 2013-08-19 MED ORDER — NON FORMULARY: Freq: Two times a day (BID) | Status: DC

## 2013-08-19 MED ORDER — OXYBUTYNIN CHLORIDE 5 MG PO TABS
5.0000 mg | ORAL_TABLET | Freq: Two times a day (BID) | ORAL | Status: DC
  Administered 2013-08-19 – 2013-08-27 (×15): 5 mg via ORAL
  Filled 2013-08-19 (×18): qty 1

## 2013-08-19 MED ORDER — BUPROPION HCL ER (SR) 150 MG PO TB12
150.0000 mg | ORAL_TABLET | Freq: Two times a day (BID) | ORAL | Status: DC
  Administered 2013-08-20 – 2013-08-27 (×14): 150 mg via ORAL
  Filled 2013-08-19 (×18): qty 1

## 2013-08-19 MED ORDER — HYDROMORPHONE HCL PF 1 MG/ML IJ SOLN
0.5000 mg | INTRAMUSCULAR | Status: DC | PRN
  Administered 2013-08-19 – 2013-08-21 (×3): 1 mg via INTRAVENOUS
  Filled 2013-08-19 (×3): qty 1

## 2013-08-19 MED ORDER — DEXTROSE-NACL 5-0.9 % IV SOLN
INTRAVENOUS | Status: DC
  Administered 2013-08-19: 30 mL/h via INTRAVENOUS

## 2013-08-19 MED ORDER — DOCUSATE SODIUM 100 MG PO CAPS: 100.0000 mg | ORAL_CAPSULE | Freq: Two times a day (BID) | ORAL | Status: DC

## 2013-08-19 MED ORDER — CITALOPRAM HYDROBROMIDE 10 MG PO TABS
10.0000 mg | ORAL_TABLET | Freq: Every day | ORAL | Status: DC
  Administered 2013-08-20 – 2013-08-27 (×7): 10 mg via ORAL
  Filled 2013-08-19 (×9): qty 1

## 2013-08-19 MED ORDER — IRBESARTAN 150 MG PO TABS
150.0000 mg | ORAL_TABLET | Freq: Two times a day (BID) | ORAL | Status: DC
  Administered 2013-08-20 – 2013-08-26 (×12): 150 mg via ORAL
  Filled 2013-08-19 (×18): qty 1

## 2013-08-19 MED ORDER — DOCUSATE SODIUM 100 MG PO CAPS
100.0000 mg | ORAL_CAPSULE | Freq: Two times a day (BID) | ORAL | Status: DC
  Administered 2013-08-20 – 2013-08-27 (×14): 100 mg via ORAL

## 2013-08-19 MED ORDER — DIPHENHYDRAMINE HCL 50 MG/ML IJ SOLN
12.5000 mg | Freq: Four times a day (QID) | INTRAMUSCULAR | Status: DC | PRN
  Administered 2013-08-21: 25 mg via INTRAVENOUS
  Filled 2013-08-19: qty 1

## 2013-08-19 MED ORDER — DIPHENHYDRAMINE HCL 25 MG PO CAPS: 25.0000 mg | ORAL_CAPSULE | Freq: Four times a day (QID) | ORAL | Status: DC | PRN

## 2013-08-19 MED ORDER — ALBUTEROL SULFATE HFA 108 (90 BASE) MCG/ACT IN AERS: 2.0000 | INHALATION_SPRAY | Freq: Four times a day (QID) | RESPIRATORY_TRACT | Status: DC | PRN

## 2013-08-19 MED ORDER — HYDROMORPHONE HCL 2 MG PO TABS
2.0000 mg | ORAL_TABLET | ORAL | Status: DC | PRN
  Administered 2013-08-19: 2 mg via ORAL
  Administered 2013-08-20: 4 mg via ORAL
  Administered 2013-08-20 (×4): 2 mg via ORAL
  Administered 2013-08-20: 4 mg via ORAL
  Administered 2013-08-21 (×2): 2 mg via ORAL
  Administered 2013-08-22 – 2013-08-23 (×6): 4 mg via ORAL
  Administered 2013-08-23 – 2013-08-25 (×10): 2 mg via ORAL
  Filled 2013-08-19 (×6): qty 1
  Filled 2013-08-19: qty 2
  Filled 2013-08-19 (×4): qty 1
  Filled 2013-08-19 (×2): qty 2
  Filled 2013-08-19 (×6): qty 1
  Filled 2013-08-19 (×2): qty 2
  Filled 2013-08-19: qty 1
  Filled 2013-08-19: qty 2
  Filled 2013-08-19: qty 1
  Filled 2013-08-19 (×2): qty 2

## 2013-08-19 MED ORDER — ONDANSETRON HCL 4 MG/2ML IJ SOLN: 4.0000 mg | Freq: Four times a day (QID) | INTRAMUSCULAR | Status: DC | PRN

## 2013-08-19 MED ORDER — LORATADINE 10 MG PO TABS
10.0000 mg | ORAL_TABLET | Freq: Every day | ORAL | Status: DC
  Administered 2013-08-20 – 2013-08-27 (×8): 10 mg via ORAL
  Filled 2013-08-19 (×8): qty 1

## 2013-08-19 MED ORDER — ACETAMINOPHEN 325 MG PO TABS: 650.0000 mg | ORAL_TABLET | Freq: Four times a day (QID) | ORAL | Status: DC | PRN

## 2013-08-19 MED ORDER — BUSPIRONE HCL 10 MG PO TABS
10.0000 mg | ORAL_TABLET | Freq: Two times a day (BID) | ORAL | Status: DC
  Administered 2013-08-20 – 2013-08-27 (×15): 10 mg via ORAL
  Filled 2013-08-19 (×17): qty 1

## 2013-08-19 NOTE — Progress Notes (Signed)
RT placed pt on auto titrate CPAP 5-20cmH2O via ffm. Sterile water was added for humidification. Pt looks comfortable and is tolerating CPAP well at this time. RT will continue to monitor as needed.

## 2013-08-19 NOTE — H&P (Signed)
[name] is an 63 y.o. female.   Chief Complaint: Left hip pain   HPI: [name] is well known to Dr. Anne Fu practice having undergone a recent left total hip anterior approach replacement back on Wed 07/28/2013.  She did very well postop and then was transferred over to CIR where she continued to improve with therapy.  She went home the following Friday 08/06/2013.  Unfortunately, she was turning in the bathroom to flush the commode and felt a pop in the hip region.  She has had increasing pain ever since.  She was seen the following Tuesday in the office where x-rays were found to show a nondisplaced crack in the greater trochanter region.  She has been essentially at bedrest since the injury and continues to have pain.  She was seen again in office today where follow up x-rays show some subsidence of the femoral stem which would indicate extension of the fracture down along the proximal femoral shaft.  Due to the changes on xray and the continued pain, it is felt that the patient will require revision surgery to the left total hip.  Surgery will be performed by Dr. Wynelle Link. Risks and benefits have been discussed with the patient and she is subsequently admitted to the hospital.  Past Medical History  Diagnosis Date  . Complication of anesthesia     NAUSEA  . Chest pain     pt states due to esophageal problem -  primary care MD aware   . Hives     since pneumonia vaccine   . Asthma   . Depression   . Anxiety   . Fibromyalgia   . Spondylosis   . Bulging disc   . 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 Phantom limb pain     rt leg  . Leg pain, left   . Eczema     ears  . GERD (gastroesophageal reflux disease)     " alot of burping"  . Constipation, chronic   . Urgency incontinence   . History of transfusion   . Cancer     hx sarcoma rt knee with amputation  . Difficulty sleeping   . Sleep apnea     pt had recent sleep study done but does not know results - report called  for and put on chart showing significant obstructive sleep apnea  . Hypertension     Past Surgical History  Procedure Laterality Date  . Abdominal hysterectomy  1980'S  . Sarcoma  1973    RT KNEE (aka)  . External ear surgery  AGE 3  . Total hip arthroplasty Left 07/28/2013    Procedure: LEFT TOTAL HIP ARTHROPLASTY ANTERIOR APPROACH;  Surgeon: [name], MD;  Location: [facility];  Service: Orthopedics;  Laterality: Left;    Family History  Problem Relation Age of Onset  . CAD Sister     CABG, stents  . CAD Father    Social History:  reports that she quit smoking about 6 years ago. She does not have any smokeless tobacco history on file. She reports that she does not drink alcohol or use illicit drugs.  Allergies:  Allergies  Allergen Reactions  . Bactrim [Sulfamethoxazole-Tmp Ds] Shortness Of Breath  . Cefuroxime Shortness Of Breath  . Omnipaque [Iohexol] Shortness Of Breath    Patient stated that she had a reaction a few years ago to the IV dye and had trouble breathing--so for her  CT Angio Chest today (07/12/13), ER MD gave her solumedrol and Benadryl before she had test and did very well with that.  ak 07/12/13  . Pneumococcal Vaccines Hives    Hives on arm after shot  . Penicillins Diarrhea  . Sulfa Antibiotics Other (See Comments)    unknown   Medications At Home: Buspirone Irbesartan Bupropion Celexa Baby ASA - on hold Dilaudid tabs Flexeril Oxybutrin Nuiron Albuterol nexium Allegra  No results found for this or any previous visit (from the past 48 hour(s)). No results found.  Review of Systems  Constitutional: Negative.   HENT: Negative.   Respiratory: Negative.   Cardiovascular: Negative.   Gastrointestinal: Negative.   Genitourinary: Negative.   Musculoskeletal: Positive for joint pain (pain in the left hip).    There were no vitals taken for this visit. Physical Exam  Constitutional: She appears well-developed and well-nourished. She appears  distressed (mild distress secondary to hip pain).  HENT:  Head: Normocephalic and atraumatic.  Noted to wear glasses  Eyes: Pupils are equal, round, and reactive to light.  Neck: Neck supple.  Cardiovascular: Normal rate, regular rhythm and normal heart sounds.   No murmur heard. Heart Rate approx 96-100  Respiratory: Breath sounds normal.  GI: Soft. Bowel sounds are normal.  Musculoskeletal:       Left hip: She exhibits decreased range of motion (Pain noted on flexion or any attempted IR or ER).       Legs:    Assessment/Plan Left Periprosthetic Proximal Femur Fracture with Loosening of Femoral Stem  Plan - Admit the patient and place at bedrest.  Preop and plan for revision surgery for the periprosthetic fracture. Surgery to be performed by Dr. Payton Emerald, Tymeka Privette Ander Purpura 08/19/2013, 3:29 PM

## 2013-08-20 ENCOUNTER — Encounter (HOSPITAL_COMMUNITY): Payer: Self-pay | Admitting: Orthopedic Surgery

## 2013-08-20 LAB — BASIC METABOLIC PANEL
Anion gap: 10 (ref 5–15)
BUN: 15 mg/dL (ref 6–23)
CO2: 26 mEq/L (ref 19–32)
Calcium: 9.1 mg/dL (ref 8.4–10.5)
Chloride: 100 mEq/L (ref 96–112)
Creatinine, Ser: 0.87 mg/dL (ref 0.50–1.10)
GFR calc Af Amer: 81 mL/min — ABNORMAL LOW (ref >90)
GFR calc non Af Amer: 70 mL/min — ABNORMAL LOW (ref >90)
Glucose, Bld: 119 mg/dL — ABNORMAL HIGH (ref 70–99)
Potassium: 4.2 mEq/L (ref 3.7–5.3)
Sodium: 136 mEq/L — ABNORMAL LOW (ref 137–147)

## 2013-08-20 LAB — URINE CULTURE
Colony Count: NO GROWTH
Culture: NO GROWTH
Special Requests: NORMAL

## 2013-08-20 LAB — CBC
HCT: 25.2 % — ABNORMAL LOW (ref 36.0–46.0)
Hemoglobin: 8 g/dL — ABNORMAL LOW (ref 12.0–15.0)
MCH: 29.7 pg (ref 26.0–34.0)
MCHC: 31.7 g/dL (ref 30.0–36.0)
MCV: 93.7 fL (ref 78.0–100.0)
Platelets: 501 10*3/uL — ABNORMAL HIGH (ref 150–400)
RBC: 2.69 MIL/uL — ABNORMAL LOW (ref 3.87–5.11)
RDW: 13.5 % (ref 11.5–15.5)
WBC: 9.9 10*3/uL (ref 4.0–10.5)

## 2013-08-20 LAB — PREPARE RBC (CROSSMATCH)

## 2013-08-20 MED ORDER — ACETAMINOPHEN 325 MG PO TABS
650.0000 mg | ORAL_TABLET | Freq: Once | ORAL | Status: AC
  Administered 2013-08-20: 650 mg via ORAL
  Filled 2013-08-20: qty 2

## 2013-08-20 MED ORDER — SODIUM CHLORIDE 0.9 % IV SOLN: Freq: Once | INTRAVENOUS | Status: DC

## 2013-08-20 NOTE — Progress Notes (Addendum)
Subjective: Hospital day - 2 Patient reports pain as mild and moderate.   Patient seen in rounds for Dr. Wynelle Link. Patient is having problems with pain in the left hip on motion, requiring pain medications Plan will be determined based on her progress where she will need to go after hospital stay.  Objective: Vital signs in last 24 hours: Temp:  [98.2 F (36.8 C)-98.5 F (36.9 C)] 98.2 F (36.8 C) (08/07 0550) Pulse Rate:  [85-99] 89 (08/07 0550) Resp:  [16-18] 16 (08/07 0550) BP: (100-127)/(54-61) 112/54 mmHg (08/07 0550) SpO2:  [91 %-97 %] 97 % (08/07 0550) Weight:  [70.308 kg (155 lb)] 70.308 kg (155 lb) (08/06 1704)  Intake/Output from previous day:  Intake/Output Summary (Last 24 hours) at 08/20/13 1122 Last data filed at 08/20/13 0930  Gross per 24 hour  Intake  970.5 ml  Output    925 ml  Net   45.5 ml    Intake/Output this shift: Total I/O In: 350 [P.O.:240; I.V.:110] Out: -   Labs:  Recent Labs  08/19/13 1732 08/20/13 0450  HGB 8.5* 8.0*    Recent Labs  08/19/13 1732 08/20/13 0450  WBC 13.4* 9.9  RBC 2.90* 2.69*  HCT 26.8* 25.2*  PLT 538* 501*    Recent Labs  08/19/13 1732 08/20/13 0450  NA 136* 136*  K 4.5 4.2  CL 100 100  CO2 24 26  BUN 17 15  CREATININE 0.92 0.87  GLUCOSE 112* 119*  CALCIUM 9.3 9.1    Recent Labs  08/19/13 1732  INR 1.11    EXAM General - Patient is Alert, Appropriate and Oriented Extremity - Neurovascular intact Sensation intact distally Dorsiflexion/Plantar flexion intact left leg Motor Function - intact, moving foot and toes well on exam.   Past Medical History  Diagnosis Date  . Complication of anesthesia     NAUSEA  . Chest pain     pt states due to esophageal problem -  primary care MD aware   . Hives     since pneumonia vaccine   . Asthma   . Depression   . Anxiety   . Fibromyalgia   . Spondylosis   . Bulging disc   . 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 Phantom limb pain      rt leg  . Leg pain, left   . Eczema     ears  . GERD (gastroesophageal reflux disease)     " alot of burping"  . Constipation, chronic   . Urgency incontinence   . History of transfusion   . Cancer     hx sarcoma rt knee with amputation  . Difficulty sleeping   . Sleep apnea     pt had recent sleep study done but does not know results - report called for and put on chart showing significant obstructive sleep apnea  . Hypertension   . Back pain 08/19/13    low back and  left leg pain due to nerve problems per pt  . 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   Weight as of this encounter: 70.308 kg (155 lb). Bedrest today NPO after midnight HGB is 8.0 today.  Will give  two units of blood in preparation for surgery tomorrow morning. Recheck morning labs. Regular medicines today.  Addendum - Patient is scheduled for major surgery tomorrow with fair amount of expected blood loss therefore anticoagulation is on hold at this time.  She will have anticoagulation initiated tomorrow following surgery.  SCD is in place.  Arlee Muslim, PA-C Orthopaedic Surgery 08/20/2013, 11:22 AM

## 2013-08-20 NOTE — Progress Notes (Signed)
Pt has refused CPAP for the night, RT to monitor and assess as needed.  

## 2013-08-20 NOTE — Progress Notes (Signed)
CARE MANAGEMENT NOTE 08/20/2013  Patient:  SPARROW, SIRACUSA   Account Number:  [id number]  Date Initiated:  08/20/2013  Documentation initiated by:  Amedee Cerrone  Subjective/Objective Assessment:   Recent left total hip anterior approach replacement back on Wed 07/28/2013. Did very well postop and then was transferred over to CIR where she continued to improve with therapy. Discharge to home on Friday 08/06/2013. Patient was turning in     Action/Plan:   home versus snf versus CIR   Anticipated DC Date:  08/23/2013   Anticipated DC Plan:  SKILLED NURSING FACILITY  In-house referral  Clinical Social Worker      DC Planning Services  NA      [facility] Choice  NA   Choice offered to / List presented to:  NA   DME arranged  NA      DME agency  NA     [facility] arranged  NA      [place] agency  NA   Status of service:  In process, will continue to follow Medicare Important Message given?  NA - LOS <3 / Initial given by admissions (If response is "NO", the following Medicare IM given date fields will be blank) Date Medicare IM given:   Medicare IM given by:   Date Additional Medicare IM given:   Additional Medicare IM given by:    Discharge Disposition:    Per UR Regulation:  Reviewed for med. necessity/level of care/duration of stay  If discussed at [place] of Stay Meetings, dates discussed:    Comments:  [id number]/Myiesha Edgar Eldridge Dace, [place], [place] [phone] Chart Reviewed for discharge and hospital needs. Discharge needs at time of review: None present will follow for needs. Review of patient progress due on [id number]

## 2013-08-20 NOTE — Progress Notes (Signed)
Clinical Social Work Department BRIEF PSYCHOSOCIAL ASSESSMENT 08/20/2013  Patient:  [name], [name]     Account Number:  [id number]     Admit date:  08/19/2013  Clinical Social Worker:  Lacie Scotts  Date/Time:  08/20/2013 08:31 AM  Referred by:  Physician  Date Referred:  08/20/2013 Referred for  SNF Placement   Other Referral:   Interview type:  Patient Other interview type:    PSYCHOSOCIAL DATA Living Status:  HUSBAND Admitted from facility:   Level of care:   Primary support name:  Sonia Side Primary support relationship to patient:  SPOUSE Degree of support available:   unclear    CURRENT CONCERNS Current Concerns  Other - See comment   Other Concerns:   hip fx protocol    SOCIAL WORK ASSESSMENT / PLAN Pt is a 63 yr old female recently d/c from [facility] ( 7/ 24 ) where she was having rehab for a left total hip arthroplasty. Pt now admitted due to a femur fx. She is scheduled for surgery on 8/8. CSW met with pt to assist with d/c planning. " I'm not sure what I will do when I leave the hospital this time." CSW will meet with pt again following surgery and PT recommendations. Pt has BCBS out of [employer] which requires prior authorization for rehab.   Assessment/plan status:  Psychosocial Support/Ongoing Assessment of Needs Other assessment/ plan:   Information/referral to community resources:   None needed at this time.    PATIENT'S/FAMILY'S RESPONSE TO PLAN OF CARE: Surgery is pending and d/c planning is ongoing.    Werner Lean LCSW [phone]

## 2013-08-21 ENCOUNTER — Inpatient Hospital Stay (HOSPITAL_COMMUNITY): Payer: BC Managed Care – PPO

## 2013-08-21 ENCOUNTER — Encounter (HOSPITAL_COMMUNITY): Payer: BC Managed Care – PPO | Admitting: Certified Registered Nurse Anesthetist

## 2013-08-21 ENCOUNTER — Inpatient Hospital Stay (HOSPITAL_COMMUNITY): Payer: BC Managed Care – PPO | Admitting: Certified Registered Nurse Anesthetist

## 2013-08-21 ENCOUNTER — Encounter (HOSPITAL_COMMUNITY)
Admission: AD | Disposition: A | Discharge status: Skilled Nursing Facility | Payer: Self-pay | Source: Ambulatory Visit | Attending: Orthopedic Surgery

## 2013-08-21 HISTORY — PX: ORIF PERIPROSTHETIC FRACTURE: SHX5034

## 2013-08-21 LAB — CBC
HCT: 32.7 % — ABNORMAL LOW (ref 36.0–46.0)
HEMOGLOBIN: 10.5 g/dL — AB (ref 12.0–15.0)
MCH: 29.2 pg (ref 26.0–34.0)
MCHC: 32.1 g/dL (ref 30.0–36.0)
MCV: 91.1 fL (ref 78.0–100.0)
PLATELETS: 464 10*3/uL — AB (ref 150–400)
RBC: 3.59 MIL/uL — AB (ref 3.87–5.11)
RDW: 14.9 % (ref 11.5–15.5)
WBC: 9.8 10*3/uL (ref 4.0–10.5)

## 2013-08-21 LAB — BASIC METABOLIC PANEL
ANION GAP: 10 (ref 5–15)
BUN: 12 mg/dL (ref 6–23)
CHLORIDE: 101 meq/L (ref 96–112)
CO2: 27 mEq/L (ref 19–32)
Calcium: 9 mg/dL (ref 8.4–10.5)
Creatinine, Ser: 0.95 mg/dL (ref 0.50–1.10)
GFR, EST AFRICAN AMERICAN: 73 mL/min — AB (ref >90)
GFR, EST NON AFRICAN AMERICAN: 63 mL/min — AB (ref >90)
Glucose, Bld: 123 mg/dL — ABNORMAL HIGH (ref 70–99)
POTASSIUM: 4.4 meq/L (ref 3.7–5.3)
SODIUM: 138 meq/L (ref 137–147)

## 2013-08-21 LAB — SURGICAL PCR SCREEN
MRSA, PCR: NEGATIVE
STAPHYLOCOCCUS AUREUS: POSITIVE — AB

## 2013-08-21 SURGERY — OPEN REDUCTION INTERNAL FIXATION (ORIF) PERIPROSTHETIC FRACTURE
Anesthesia: General | Site: Hip | Laterality: Left

## 2013-08-21 MED ORDER — LIDOCAINE HCL (CARDIAC) 20 MG/ML IV SOLN
INTRAVENOUS | Status: DC | PRN
  Administered 2013-08-21: 100 mg via INTRAVENOUS

## 2013-08-21 MED ORDER — METHOCARBAMOL 500 MG PO TABS
500.0000 mg | ORAL_TABLET | Freq: Four times a day (QID) | ORAL | Status: DC | PRN
  Administered 2013-08-22 – 2013-08-27 (×3): 500 mg via ORAL
  Filled 2013-08-21 (×3): qty 1

## 2013-08-21 MED ORDER — LACTATED RINGERS IV SOLN
INTRAVENOUS | Status: DC | PRN
  Administered 2013-08-21: 12:00:00 via INTRAVENOUS

## 2013-08-21 MED ORDER — SUCCINYLCHOLINE CHLORIDE 20 MG/ML IJ SOLN
INTRAMUSCULAR | Status: DC | PRN
  Administered 2013-08-21: 100 mg via INTRAVENOUS

## 2013-08-21 MED ORDER — DIPHENHYDRAMINE HCL 12.5 MG/5ML PO ELIX: 12.5000 mg | ORAL_SOLUTION | ORAL | Status: DC | PRN

## 2013-08-21 MED ORDER — ACETAMINOPHEN 325 MG PO TABS
650.0000 mg | ORAL_TABLET | Freq: Four times a day (QID) | ORAL | Status: DC | PRN
  Administered 2013-08-23 – 2013-08-27 (×3): 650 mg via ORAL
  Filled 2013-08-21 (×3): qty 2

## 2013-08-21 MED ORDER — GLYCOPYRROLATE 0.2 MG/ML IJ SOLN
INTRAMUSCULAR | Status: DC | PRN
  Administered 2013-08-21: 0.6 mg via INTRAVENOUS

## 2013-08-21 MED ORDER — ONDANSETRON HCL 4 MG/2ML IJ SOLN
INTRAMUSCULAR | Status: DC | PRN
  Administered 2013-08-21: 4 mg via INTRAVENOUS

## 2013-08-21 MED ORDER — ALBUTEROL SULFATE (2.5 MG/3ML) 0.083% IN NEBU
3.0000 mL | INHALATION_SOLUTION | Freq: Four times a day (QID) | RESPIRATORY_TRACT | Status: DC | PRN
  Administered 2013-08-21: 3 mL via RESPIRATORY_TRACT
  Filled 2013-08-21: qty 3

## 2013-08-21 MED ORDER — SODIUM CHLORIDE 0.9 % IV SOLN
INTRAVENOUS | Status: DC | PRN
  Administered 2013-08-21: 12:00:00 via INTRAVENOUS

## 2013-08-21 MED ORDER — METHOCARBAMOL 1000 MG/10ML IJ SOLN
500.0000 mg | Freq: Four times a day (QID) | INTRAMUSCULAR | Status: DC | PRN
  Filled 2013-08-21: qty 5

## 2013-08-21 MED ORDER — 0.9 % SODIUM CHLORIDE (POUR BTL) OPTIME
TOPICAL | Status: DC | PRN
  Administered 2013-08-21: 1000 mL

## 2013-08-21 MED ORDER — ONDANSETRON HCL 4 MG/2ML IJ SOLN: 4.0000 mg | Freq: Four times a day (QID) | INTRAMUSCULAR | Status: DC | PRN

## 2013-08-21 MED ORDER — BUPIVACAINE HCL (PF) 0.25 % IJ SOLN
INTRAMUSCULAR | Status: AC
  Filled 2013-08-21: qty 30

## 2013-08-21 MED ORDER — ONDANSETRON HCL 4 MG/2ML IJ SOLN
INTRAMUSCULAR | Status: AC
  Filled 2013-08-21: qty 2

## 2013-08-21 MED ORDER — PROPOFOL 10 MG/ML IV BOLUS
INTRAVENOUS | Status: DC | PRN
  Administered 2013-08-21: 150 mg via INTRAVENOUS

## 2013-08-21 MED ORDER — FENTANYL CITRATE 0.05 MG/ML IJ SOLN
INTRAMUSCULAR | Status: AC
  Filled 2013-08-21: qty 5

## 2013-08-21 MED ORDER — MIDAZOLAM HCL 5 MG/5ML IJ SOLN
INTRAMUSCULAR | Status: DC | PRN
  Administered 2013-08-21: 2 mg via INTRAVENOUS

## 2013-08-21 MED ORDER — DOCUSATE SODIUM 100 MG PO CAPS: 100.0000 mg | ORAL_CAPSULE | Freq: Two times a day (BID) | ORAL | Status: DC

## 2013-08-21 MED ORDER — PANTOPRAZOLE SODIUM 40 MG PO TBEC: 40.0000 mg | DELAYED_RELEASE_TABLET | Freq: Two times a day (BID) | ORAL | Status: DC

## 2013-08-21 MED ORDER — NEOSTIGMINE METHYLSULFATE 10 MG/10ML IV SOLN
INTRAVENOUS | Status: AC
  Filled 2013-08-21: qty 1

## 2013-08-21 MED ORDER — METOCLOPRAMIDE HCL 10 MG PO TABS: 5.0000 mg | ORAL_TABLET | Freq: Three times a day (TID) | ORAL | Status: DC | PRN

## 2013-08-21 MED ORDER — LORATADINE 10 MG PO TABS: 10.0000 mg | ORAL_TABLET | Freq: Every day | ORAL | Status: DC

## 2013-08-21 MED ORDER — POLYSACCHARIDE IRON COMPLEX 150 MG PO CAPS
150.0000 mg | ORAL_CAPSULE | Freq: Two times a day (BID) | ORAL | Status: DC
  Administered 2013-08-21 – 2013-08-27 (×12): 150 mg via ORAL
  Filled 2013-08-21 (×13): qty 1

## 2013-08-21 MED ORDER — ACETAMINOPHEN 500 MG PO TABS
1000.0000 mg | ORAL_TABLET | Freq: Four times a day (QID) | ORAL | Status: AC
  Administered 2013-08-21 – 2013-08-22 (×3): 1000 mg via ORAL
  Filled 2013-08-21 (×5): qty 2

## 2013-08-21 MED ORDER — FENTANYL CITRATE 0.05 MG/ML IJ SOLN
INTRAMUSCULAR | Status: DC | PRN
Start: 2013-08-21 — End: 2013-08-21
  Administered 2013-08-21 (×5): 50 ug via INTRAVENOUS

## 2013-08-21 MED ORDER — VANCOMYCIN HCL 1000 MG IV SOLR
1000.0000 mg | INTRAVENOUS | Status: DC | PRN
  Administered 2013-08-21: 1000 mg via INTRAVENOUS

## 2013-08-21 MED ORDER — BUPIVACAINE HCL 0.25 % IJ SOLN
INTRAMUSCULAR | Status: DC | PRN
  Administered 2013-08-21: 30 mL

## 2013-08-21 MED ORDER — NEOSTIGMINE METHYLSULFATE 10 MG/10ML IV SOLN
INTRAVENOUS | Status: DC | PRN
  Administered 2013-08-21: 5 mg via INTRAVENOUS

## 2013-08-21 MED ORDER — DEXAMETHASONE SODIUM PHOSPHATE 10 MG/ML IJ SOLN
INTRAMUSCULAR | Status: AC
  Filled 2013-08-21: qty 1

## 2013-08-21 MED ORDER — RIVAROXABAN 10 MG PO TABS
10.0000 mg | ORAL_TABLET | Freq: Every day | ORAL | Status: DC
  Administered 2013-08-22 – 2013-08-27 (×6): 10 mg via ORAL
  Filled 2013-08-21 (×7): qty 1

## 2013-08-21 MED ORDER — BISACODYL 10 MG RE SUPP: 10.0000 mg | Freq: Every day | RECTAL | Status: DC | PRN

## 2013-08-21 MED ORDER — LACTATED RINGERS IV SOLN
INTRAVENOUS | Status: DC | PRN
  Administered 2013-08-21 (×2): via INTRAVENOUS

## 2013-08-21 MED ORDER — GLYCOPYRROLATE 0.2 MG/ML IJ SOLN
INTRAMUSCULAR | Status: AC
  Filled 2013-08-21: qty 3

## 2013-08-21 MED ORDER — ROCURONIUM BROMIDE 100 MG/10ML IV SOLN
INTRAVENOUS | Status: DC | PRN
  Administered 2013-08-21: 30 mg via INTRAVENOUS

## 2013-08-21 MED ORDER — BUPIVACAINE LIPOSOME 1.3 % IJ SUSP
20.0000 mL | Freq: Once | INTRAMUSCULAR | Status: DC
  Filled 2013-08-21: qty 20

## 2013-08-21 MED ORDER — PHENOL 1.4 % MT LIQD: 1.0000 | OROMUCOSAL | Status: DC | PRN

## 2013-08-21 MED ORDER — POLYETHYLENE GLYCOL 3350 17 G PO PACK
17.0000 g | PACK | Freq: Two times a day (BID) | ORAL | Status: DC
Start: 2013-08-21 — End: 2013-08-21

## 2013-08-21 MED ORDER — MENTHOL 3 MG MT LOZG
1.0000 | LOZENGE | OROMUCOSAL | Status: DC | PRN
  Filled 2013-08-21: qty 9

## 2013-08-21 MED ORDER — SODIUM CHLORIDE 0.9 % IJ SOLN
INTRAMUSCULAR | Status: DC | PRN
  Administered 2013-08-21: 10 mL

## 2013-08-21 MED ORDER — BUPIVACAINE LIPOSOME 1.3 % IJ SUSP
INTRAMUSCULAR | Status: DC | PRN
  Administered 2013-08-21: 20 mL

## 2013-08-21 MED ORDER — LACTATED RINGERS IV SOLN
INTRAVENOUS | Status: DC
Start: 2013-08-21 — End: 2013-08-21

## 2013-08-21 MED ORDER — VANCOMYCIN HCL IN DEXTROSE 1-5 GM/200ML-% IV SOLN
1000.0000 mg | Freq: Two times a day (BID) | INTRAVENOUS | Status: AC
  Administered 2013-08-21: 1000 mg via INTRAVENOUS
  Filled 2013-08-21: qty 200

## 2013-08-21 MED ORDER — DOXEPIN HCL 10 MG PO CAPS
10.0000 mg | ORAL_CAPSULE | Freq: Every day | ORAL | Status: DC
  Administered 2013-08-21 – 2013-08-26 (×5): 10 mg via ORAL
  Filled 2013-08-21 (×7): qty 1

## 2013-08-21 MED ORDER — STERILE WATER FOR IRRIGATION IR SOLN
Status: DC | PRN
  Administered 2013-08-21: 1500 mL

## 2013-08-21 MED ORDER — FLEET ENEMA 7-19 GM/118ML RE ENEM: 1.0000 | ENEMA | Freq: Once | RECTAL | Status: AC | PRN

## 2013-08-21 MED ORDER — LIDOCAINE HCL (CARDIAC) 20 MG/ML IV SOLN
INTRAVENOUS | Status: AC
  Filled 2013-08-21: qty 5

## 2013-08-21 MED ORDER — ACETAMINOPHEN 650 MG RE SUPP: 650.0000 mg | Freq: Four times a day (QID) | RECTAL | Status: DC | PRN

## 2013-08-21 MED ORDER — PROPOFOL 10 MG/ML IV BOLUS
INTRAVENOUS | Status: AC
  Filled 2013-08-21: qty 20

## 2013-08-21 MED ORDER — VANCOMYCIN HCL IN DEXTROSE 1-5 GM/200ML-% IV SOLN
INTRAVENOUS | Status: AC
  Filled 2013-08-21: qty 200

## 2013-08-21 MED ORDER — SODIUM CHLORIDE 0.9 % IJ SOLN
INTRAMUSCULAR | Status: AC
  Filled 2013-08-21: qty 50

## 2013-08-21 MED ORDER — METOCLOPRAMIDE HCL 5 MG/ML IJ SOLN: 5.0000 mg | Freq: Three times a day (TID) | INTRAMUSCULAR | Status: DC | PRN

## 2013-08-21 MED ORDER — HYDROMORPHONE HCL PF 1 MG/ML IJ SOLN: 0.2500 mg | INTRAMUSCULAR | Status: DC | PRN

## 2013-08-21 MED ORDER — ACETAMINOPHEN 10 MG/ML IV SOLN
1000.0000 mg | INTRAVENOUS | Status: AC
  Administered 2013-08-21: 1000 mg via INTRAVENOUS
  Filled 2013-08-21: qty 100

## 2013-08-21 MED ORDER — SODIUM CHLORIDE 0.9 % IV SOLN
INTRAVENOUS | Status: DC
  Administered 2013-08-21 – 2013-08-22 (×3): via INTRAVENOUS

## 2013-08-21 MED ORDER — MIDAZOLAM HCL 2 MG/2ML IJ SOLN
INTRAMUSCULAR | Status: AC
  Filled 2013-08-21: qty 2

## 2013-08-21 MED ORDER — DEXAMETHASONE SODIUM PHOSPHATE 10 MG/ML IJ SOLN
INTRAMUSCULAR | Status: DC | PRN
  Administered 2013-08-21: 10 mg via INTRAVENOUS

## 2013-08-21 MED ORDER — ONDANSETRON HCL 4 MG PO TABS: 4.0000 mg | ORAL_TABLET | Freq: Four times a day (QID) | ORAL | Status: DC | PRN

## 2013-08-21 SURGICAL SUPPLY — 59 items
BAG ZIPLOCK 12X15 (MISCELLANEOUS) ×3 IMPLANT
CABLE GTR COCR 1.8MMX635MM (Orthopedic Implant) ×3 IMPLANT
CABLE GTR COCR 1.8X635 (Orthopedic Implant) ×6 IMPLANT
CLAW TROCHANTERIC GREATER (Orthopedic Implant) ×6 IMPLANT
CLOSURE WOUND 1/2 X4 (GAUZE/BANDAGES/DRESSINGS) ×1
CLOSURE WOUND 1/4X4 (GAUZE/BANDAGES/DRESSINGS) ×1
DRAPE INCISE IOBAN 66X45 STRL (DRAPES) ×3 IMPLANT
DRAPE ORTHO SPLIT 77X108 STRL (DRAPES) ×4
DRAPE POUCH INSTRU U-SHP 10X18 (DRAPES) ×3 IMPLANT
DRAPE SURG 17X11 SM STRL (DRAPES) ×3 IMPLANT
DRAPE SURG ORHT 6 SPLT 77X108 (DRAPES) ×2 IMPLANT
DRAPE U-SHAPE 47X51 STRL (DRAPES) ×3 IMPLANT
DRSG ADAPTIC 3X8 NADH LF (GAUZE/BANDAGES/DRESSINGS) ×3 IMPLANT
DRSG EMULSION OIL 3X16 NADH (GAUZE/BANDAGES/DRESSINGS) ×3 IMPLANT
DRSG MEPILEX BORDER 4X12 (GAUZE/BANDAGES/DRESSINGS) ×3 IMPLANT
DRSG MEPILEX BORDER 4X4 (GAUZE/BANDAGES/DRESSINGS) ×3 IMPLANT
DRSG PAD ABDOMINAL 8X10 ST (GAUZE/BANDAGES/DRESSINGS) ×6 IMPLANT
DURAPREP 26ML APPLICATOR (WOUND CARE) ×3 IMPLANT
ELECT BLADE TIP CTD 4 INCH (ELECTRODE) ×3 IMPLANT
ELECT REM PT RETURN 9FT ADLT (ELECTROSURGICAL) ×3
ELECTRODE REM PT RTRN 9FT ADLT (ELECTROSURGICAL) ×1 IMPLANT
EVACUATOR 1/8 PVC DRAIN (DRAIN) ×3 IMPLANT
FACESHIELD WRAPAROUND (MASK) ×12 IMPLANT
GAUZE SPONGE 4X4 12PLY STRL (GAUZE/BANDAGES/DRESSINGS) ×6 IMPLANT
GLOVE BIO SURGEON STRL SZ7.5 (GLOVE) ×3 IMPLANT
GLOVE BIO SURGEON STRL SZ8 (GLOVE) ×3 IMPLANT
GLOVE BIOGEL PI IND STRL 8 (GLOVE) ×1 IMPLANT
GLOVE BIOGEL PI INDICATOR 8 (GLOVE) ×2
GLOVE ORTHO TXT STRL SZ7.5 (GLOVE) ×3 IMPLANT
GOWN STRL REUS W/TWL LRG LVL3 (GOWN DISPOSABLE) ×3 IMPLANT
GOWN STRL REUS W/TWL XL LVL3 (GOWN DISPOSABLE) ×3 IMPLANT
HEAD CERAMIC DELTA 28 P1.5 HIP (Head) ×3 IMPLANT
IMMOBILIZER KNEE 20 (SOFTGOODS) ×3
IMMOBILIZER KNEE 20 THIGH 36 (SOFTGOODS) ×1 IMPLANT
KIT BASIN OR (CUSTOM PROCEDURE TRAY) ×3 IMPLANT
MANIFOLD NEPTUNE II (INSTRUMENTS) ×3 IMPLANT
NS IRRIG 1000ML POUR BTL (IV SOLUTION) ×6 IMPLANT
PACK TOTAL JOINT (CUSTOM PROCEDURE TRAY) ×3 IMPLANT
PADDING CAST COTTON 6X4 STRL (CAST SUPPLIES) ×6 IMPLANT
PASSER SUT SWANSON 36MM LOOP (INSTRUMENTS) ×3 IMPLANT
POSITIONER SURGICAL ARM (MISCELLANEOUS) ×3 IMPLANT
SCOTCHCAST PLUS 4X4 WHITE (CAST SUPPLIES) ×3 IMPLANT
SPONGE LAP 18X18 X RAY DECT (DISPOSABLE) ×3 IMPLANT
SPONGE LAP 4X18 X RAY DECT (DISPOSABLE) ×3 IMPLANT
STAPLER VISISTAT 35W (STAPLE) ×3 IMPLANT
STEM CORAIL KA11 (Stem) ×3 IMPLANT
STRIP CLOSURE SKIN 1/2X4 (GAUZE/BANDAGES/DRESSINGS) ×2 IMPLANT
STRIP CLOSURE SKIN 1/4X4 (GAUZE/BANDAGES/DRESSINGS) ×2 IMPLANT
SUCTION FRAZIER TIP 10 FR DISP (SUCTIONS) ×3 IMPLANT
SUT ETHIBOND NAB CT1 #1 30IN (SUTURE) ×3 IMPLANT
SUT MNCRL AB 4-0 PS2 18 (SUTURE) ×3 IMPLANT
SUT VIC AB 1 CT1 27 (SUTURE) ×6
SUT VIC AB 1 CT1 27XBRD ANTBC (SUTURE) ×3 IMPLANT
SUT VIC AB 2-0 CT1 27 (SUTURE) ×6
SUT VIC AB 2-0 CT1 TAPERPNT 27 (SUTURE) ×3 IMPLANT
SUT VLOC 180 0 24IN GS25 (SUTURE) ×6 IMPLANT
TOWEL OR 17X26 10 PK STRL BLUE (TOWEL DISPOSABLE) ×6 IMPLANT
TRAY FOLEY CATH 14FRSI W/METER (CATHETERS) ×3 IMPLANT
WATER STERILE IRR 1500ML POUR (IV SOLUTION) ×3 IMPLANT

## 2013-08-21 NOTE — Progress Notes (Signed)
Subjective: Hospital day - 3 Patient reports pain as mild.   Patient seen in rounds with Dr. Wynelle Link. Husband in room. Patient is well, but has had some minor complaints of pain in the left hip, requiring pain medications Plan will be determined based on her progress where she will need to go after hospital stay.  Objective: Vital signs in last 24 hours: Temp:  [97.6 F (36.4 C)-98.3 F (36.8 C)] 97.8 F (36.6 C) (08/08 3295) Pulse Rate:  [83-91] 91 (08/08 0613) Resp:  [15-16] 15 (08/08 0613) BP: (95-110)/(47-92) 110/58 mmHg (08/08 0613) SpO2:  [93 %-99 %] 93 % (08/08 0613)  Intake/Output from previous day:  Intake/Output Summary (Last 24 hours) at 08/21/13 0743 Last data filed at 08/21/13 1884  Gross per 24 hour  Intake 1893.59 ml  Output   1825 ml  Net  68.59 ml    Intake/Output this shift:    Labs:  Recent Labs  08/19/13 1732 08/20/13 0450 08/21/13 0550  HGB 8.5* 8.0* 10.5*    Recent Labs  08/20/13 0450 08/21/13 0550  WBC 9.9 9.8  RBC 2.69* 3.59*  HCT 25.2* 32.7*  PLT 501* 464*    Recent Labs  08/20/13 0450 08/21/13 0550  NA 136* 138  K 4.2 4.4  CL 100 101  CO2 26 27  BUN 15 12  CREATININE 0.87 0.95  GLUCOSE 119* 123*  CALCIUM 9.1 9.0    Recent Labs  08/19/13 1732  INR 1.11    EXAM General - Patient is Alert, Appropriate and Oriented Extremity - Neurovascular intact Sensation intact distally Dressing - dressing C/D/I Motor Function - intact, moving foot and toes well on exam.   Past Medical History  Diagnosis Date  . Complication of anesthesia     NAUSEA  . Chest pain     pt states due to esophageal problem -  primary care MD aware   . Hives     since pneumonia vaccine   . Asthma   . Depression   . Anxiety   . Fibromyalgia   . Spondylosis   . Bulging disc   . 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 Phantom limb pain     rt leg  . Leg pain, left   . Eczema     ears  . GERD (gastroesophageal reflux  disease)     " alot of burping"  . Constipation, chronic   . Urgency incontinence   . History of transfusion   . Cancer     hx sarcoma rt knee with amputation  . Difficulty sleeping   . Sleep apnea     pt had recent sleep study done but does not know results - report called for and put on chart showing significant obstructive sleep apnea  . Hypertension   . Back pain 08/19/13    low back and  left leg pain due to nerve problems per pt  . PONV (postoperative nausea and vomiting)   . Peripheral neuropathy   . History of bronchitis   . History of pneumonia   . Hiatal hernia   . History of scarlet fever   . Postmenopausal   . History of measles   . History of mumps   . 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   Weight as of this encounter: 70.308 kg (155 lb).  NPO Surgery later today Patient is scheduled for surgery later today with fair amount of expected blood loss therefore anticoagulation is on hold at this time. She will have anticoagulation initiated following surgery. SCD is in place.  Arlee Muslim, PA-C Orthopaedic Surgery 08/21/2013, 7:43 AM

## 2013-08-21 NOTE — Brief Op Note (Signed)
08/19/2013 - 08/21/2013  1:36 PM  PATIENT:  [name]  63 y.o. female  PRE-OPERATIVE DIAGNOSIS:  left periprosthetic femur fracture  POST-OPERATIVE DIAGNOSIS:  left periprosthetic femur fracture  PROCEDURE:  Procedure(s): OPEN REDUCTION INTERNAL FIXATION (ORIF) PERIPROSTHETIC FRACTURE WITH FEMORAL REVISION, LEFT FEMUR (Left)  SURGEON:  Surgeon(s) and Role:    [name], MD - Primary  PHYSICIAN ASSISTANT:   ASSISTANTS: Arlee Muslim, PA-C   ANESTHESIA:   general  EBL:  Total I/O In: 2393 [I.V.:1750; Blood:643] Out: 2150 [Urine:1150; Blood:1000]  DRAINS: (Medium) Hemovact drain(s) in the left hip with  Suction Open   LOCAL MEDICATIONS USED:  OTHER Exparel  COUNTS:  YES  TOURNIQUET:  * No tourniquets in log *  DICTATION: .Other Dictation: Dictation Number [id number]  PLAN OF CARE: Admit to inpatient   PATIENT DISPOSITION:  PACU - hemodynamically stable.

## 2013-08-21 NOTE — Anesthesia Preprocedure Evaluation (Addendum)
Anesthesia Evaluation  Patient identified by MRN, date of birth, ID band Patient awake    Reviewed: Allergy & Precautions, H&P , NPO status , Patient's Chart, lab work & pertinent test results  History of Anesthesia Complications (+) PONV  Airway Mallampati: III TM Distance: >3 FB Neck ROM: full    Dental no notable dental hx.    Pulmonary asthma , sleep apnea , former smoker,  breath sounds clear to auscultation  Pulmonary exam normal       Cardiovascular hypertension, Pt. on medications Rhythm:regular Rate:Normal     Neuro/Psych negative neurological ROS  negative psych ROS   GI/Hepatic negative GI ROS, Neg liver ROS, GERD-  Medicated and Controlled,  Endo/Other  negative endocrine ROS  Renal/GU negative Renal ROS  negative genitourinary   Musculoskeletal   Abdominal   Peds  Hematology negative hematology ROS (+) anemia , hgb 10.5   Anesthesia Other Findings   Reproductive/Obstetrics negative OB ROS                          Anesthesia Physical Anesthesia Plan  ASA: III  Anesthesia Plan: General   Post-op Pain Management:    Induction: Intravenous  Airway Management Planned: Oral ETT  Additional Equipment:   Intra-op Plan:   Post-operative Plan: Extubation in OR  Informed Consent: I have reviewed the patients History and Physical, chart, labs and discussed the procedure including the risks, benefits and alternatives for the proposed anesthesia with the patient or authorized representative who has indicated his/her understanding and acceptance.   Dental Advisory Given  Plan Discussed with: CRNA and Surgeon  Anesthesia Plan Comments:         Anesthesia Quick Evaluation                                  Anesthesia Evaluation  Patient identified by MRN, date of birth, ID band Patient awake    Reviewed: Allergy & Precautions, H&P , NPO status , Patient's Chart, lab  work & pertinent test results  History of Anesthesia Complications (+) PONV  Airway Mallampati: II TM Distance: >3 FB Neck ROM: Full    Dental  (+) Caps, Dental Advisory Given   Pulmonary neg pulmonary ROS, asthma , sleep apnea , former smoker,  breath sounds clear to auscultation  Pulmonary exam normal       Cardiovascular negative cardio ROS  Rhythm:Regular Rate:Normal     Neuro/Psych Anxiety Depression negative neurological ROS  negative psych ROS   GI/Hepatic negative GI ROS, Neg liver ROS, hiatal hernia, GERD-  ,  Endo/Other  negative endocrine ROS  Renal/GU negative Renal ROS  negative genitourinary   Musculoskeletal negative musculoskeletal ROS (+)   Abdominal   Peds negative pediatric ROS (+)  Hematology negative hematology ROS (+)   Anesthesia Other Findings   Reproductive/Obstetrics                          Anesthesia Physical Anesthesia Plan  ASA: II  Anesthesia Plan: General   Post-op Pain Management:    Induction: Intravenous  Airway Management Planned: Oral ETT  Additional Equipment:   Intra-op Plan:   Post-operative Plan: Extubation in OR  Informed Consent: I have reviewed the patients History and Physical, chart, labs and discussed the procedure including the risks, benefits and alternatives for the proposed anesthesia with the patient or authorized  representative who has indicated his/her understanding and acceptance.   Dental advisory given  Plan Discussed with: CRNA  Anesthesia Plan Comments:         Anesthesia Quick Evaluation

## 2013-08-21 NOTE — Transfer of Care (Signed)
Immediate Anesthesia Transfer of Care Note  Patient: [name]  Procedure(s) Performed: Procedure(s) (LRB): OPEN REDUCTION INTERNAL FIXATION (ORIF) PERIPROSTHETIC FRACTURE WITH FEMORAL REVISION, LEFT FEMUR (Left)  Patient Location: PACU  Anesthesia Type: General  Level of Consciousness: sedated, patient cooperative and responds to stimulation  Airway & Oxygen Therapy: Patient Spontanous Breathing and Patient connected to face mask oxgen  Post-op Assessment: Report given to PACU RN and Post -op Vital signs reviewed and stable  Post vital signs: Reviewed and stable  Complications: No apparent anesthesia complicationsImmediate Anesthesia Transfer of Care Note  Patient: [name]  Procedure(s) Performed: Procedure(s): OPEN REDUCTION INTERNAL FIXATION (ORIF) PERIPROSTHETIC FRACTURE WITH FEMORAL REVISION, LEFT FEMUR (Left)  Patient Location: PACU  Anesthesia Type:General  Level of Consciousness: awake, alert  and oriented  Airway & Oxygen Therapy: Patient Spontanous Breathing  Post-op Assessment: Report given to PACU RN and Post -op Vital signs reviewed and stable  Post vital signs: Reviewed and stable  Complications: No apparent anesthesia complications

## 2013-08-21 NOTE — Op Note (Signed)
NAMEALACIA, [name] NO.:  [id number]  MEDICAL RECORD NO.:  [id number]  LOCATION:  8938                         FACILITY:  [facility]  PHYSICIAN:  Gaynelle Arabian, M.D.    DATE OF BIRTH:  Jun 15, 1950  DATE OF PROCEDURE:  08/21/2013 DATE OF DISCHARGE:                              OPERATIVE REPORT   PREOPERATIVE DIAGNOSIS:  Left periprosthetic femur fracture.  POSTOPERATIVE DIAGNOSIS:  Left periprosthetic femur fracture.  PROCEDURE:  Open reduction and internal fixation of left periprosthetic femur fracture with femoral revision.  SURGEON:  Gaynelle Arabian, MD  ASSISTANT:  Alexzandrew L. Dara Lords, PA-C  ANESTHESIA:  General.  ESTIMATED BLOOD LOSS:  1000.  DRAIN:  Hemovac x1.  COMPLICATIONS:  None.  CONDITION:  Stable to recovery.  BRIEF CLINICAL NOTE:  [name] is a 63 year old female, had a left total hip arthroplasty done little over 2 weeks ago.  She was doing really well initially, but then had an episode where she twisted and felt a pop.  Initial x-ray showed appeared to be a greater trochanteric nondisplaced periprosthetic femur fracture.  We decided to observe this, but unfortunately pain got worse.  She returned to the office 2 days ago and the stem had subsided approximately half an inch.  We decided this is unstable fracture and that she was admitted to the hospital for pain control.  She was taken to the operating room, today for open reduction and internal fixation with femoral revision.  PROCEDURE IN DETAIL:  After successful administration of general anesthetic, the patient was placed in the right lateral decubitus position with the left side up and held with the hip positioner.  The left lower extremity was isolated from her perineum with plastic drapes and prepped and draped in the usual sterile fashion.  A long posterolateral incision was made with a 10 blade through subcutaneous tissue to the level of fascia lata which was incised in line  with the skin incision.  The sciatic nerve was palpated and protected.  The fracture was obviously at the greater trochanter, also goes down to the level of the lesser trochanter.  It all appears to be posterior.  I extended the incision distally and palpated the femoral shaft for about 6 cm below the lesser trochanter and everything was stable there.  We then elevated the tissue off the posterior femur at the level of the greater trochanter and then dislocated the hip.  It was noted that the femoral component had subsided at least an inch.  I was easily able to remove the femoral component.  Fortunately, the anterior aspect of the femoral neck as well as the entire shaft was intact.  The posterior aspect of the femoral neck was intact, also just at the level of the lesser trochanter.  We removed that.  It was a size 9 Corail stem that was in place.  We started broaching at the size 9, we went to size 11 which had a fantastic fit, both excellent torsional and rotational stability.  It was placed at the level of that the anterior femoral neck because that was consistent with a level that the stem was initially placed before the fracture.  This  had great stability rotation.  We placed a trial neck and a trial 28+ 1.5 head and reduced the hip.  She had an outstanding stability with full extension, full external rotation, 70 degrees flexion, 40 degrees adduction, 90 degrees internal rotation, 90 degrees of flexion and 70 degrees of internal rotation. The broach and trial were left intact.  I then reduced the fracture. The Zimmer greater trochanteric reattachment plate which is 287 mm in length was then placed and 4 cables were passed through the plate.  We reduced the greater trochanteric fracture, passed the cables and tightened it.  It was felt that it was a good reduction.  We then rotated the femur in order to remove the broach.  There was some movement at that fracture site.  We then  removed the broach and placed the size 11 Corail stem and approximately 20 degrees of anteversion with excellent purchase and excellent stability.  We then placed a 28+ 1.5 mm head.  The hip was reduced with the same stability parameters.  I was not satisfied with stability of the reduction of the fracture.  Thus I cut the cables and removed them.  We could not get neutral cables to thread through that plate, thus we had to get a new plate.  We did that, passed 4 cables, reduced the fracture and tightened the cables sufficiently that the greater trochanter, lesser trochanter, and the shaft, all moved as a solitary unit.  I was very happy with our reduction.  The wound was then copiously irrigated with saline solution. Exparel 20 mL mixed with 40 mL of saline was injected into the gluteal muscles, fascia lata, and subcu tissues.  Additional 20 mL of 0.25% Marcaine was injected into the same tissues.  Fascia lata was then closed over Hemovac drain with running #1 V-Loc suture.  A second V-Loc was used for the deep subcu, then 2-0 Vicryl interrupted was used for the superficial subcu.  Subcuticular was closed with running 4-0 Monocryl.  The drains hooked to suction.  Incision cleaned and dried. Steri-Strips and a bulky sterile dressing applied.  She was placed into a knee immobilizer, awakened, and transported to recovery in stable condition.     Gaynelle Arabian, M.D.     FA/MEDQ  D:  08/21/2013  T:  08/21/2013  Job:  [id number]

## 2013-08-21 NOTE — Anesthesia Postprocedure Evaluation (Signed)
  Anesthesia Post-op Note  Patient: [name]  Procedure(s) Performed: Procedure(s) (LRB): OPEN REDUCTION INTERNAL FIXATION (ORIF) PERIPROSTHETIC FRACTURE WITH FEMORAL REVISION, LEFT FEMUR (Left)  Patient Location: PACU  Anesthesia Type: General  Level of Consciousness: awake and alert   Airway and Oxygen Therapy: Patient Spontanous Breathing  Post-op Pain: mild  Post-op Assessment: Post-op Vital signs reviewed, Patient's Cardiovascular Status Stable, Respiratory Function Stable, Patent Airway and No signs of Nausea or vomiting  Last Vitals:  Filed Vitals:   08/21/13 1448  BP: 127/62  Pulse: 98  Temp: 36.8 C  Resp: 14    Post-op Vital Signs: stable   Complications: No apparent anesthesia complications

## 2013-08-21 NOTE — Progress Notes (Signed)
Patient continues to refuse CPAP therapy.  Patient states that she will resume usage of her CPAP when she goes home. CPAP machine removed from patient room.

## 2013-08-22 LAB — BASIC METABOLIC PANEL
ANION GAP: 9 (ref 5–15)
BUN: 9 mg/dL (ref 6–23)
CHLORIDE: 104 meq/L (ref 96–112)
CO2: 26 mEq/L (ref 19–32)
Calcium: 8.5 mg/dL (ref 8.4–10.5)
Creatinine, Ser: 0.74 mg/dL (ref 0.50–1.10)
GFR calc Af Amer: >90 mL/min (ref >90)
GFR calc non Af Amer: 89 mL/min — ABNORMAL LOW (ref >90)
GLUCOSE: 140 mg/dL — AB (ref 70–99)
POTASSIUM: 4.2 meq/L (ref 3.7–5.3)
Sodium: 139 mEq/L (ref 137–147)

## 2013-08-22 LAB — CBC
HCT: 26.9 % — ABNORMAL LOW (ref 36.0–46.0)
HEMOGLOBIN: 9 g/dL — AB (ref 12.0–15.0)
MCH: 29.9 pg (ref 26.0–34.0)
MCHC: 33.5 g/dL (ref 30.0–36.0)
MCV: 89.4 fL (ref 78.0–100.0)
PLATELETS: 377 10*3/uL (ref 150–400)
RBC: 3.01 MIL/uL — AB (ref 3.87–5.11)
RDW: 14.3 % (ref 11.5–15.5)
WBC: 14.6 10*3/uL — AB (ref 4.0–10.5)

## 2013-08-22 MED ORDER — LIP MEDEX EX OINT
TOPICAL_OINTMENT | CUTANEOUS | Status: AC
  Administered 2013-08-22: 15:00:00
  Filled 2013-08-22: qty 7

## 2013-08-22 NOTE — Clinical Social Work Note (Signed)
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 CSW discussed SNF option as a backup plan in case she is unable to go to CIR and they are agreeable to this- their preference would be in the area of [facility] as they are living there. Both patient and her husband are appreciative of CSW visit and assistance- the patient is very optimistic that this surgery and upcoming rehab will give her relief and a higher quality of life. Support and encouragement provided. Will proceed with SNF search for backup to CIR.  Eduard Clos, MSW, [place] Weekend coverage

## 2013-08-22 NOTE — Evaluation (Addendum)
Physical Therapy Evaluation Patient Details Name: SHANNEN FLANSBURG MRN: [id number] DOB: 04-20-1950 Today's Date: 08/22/2013   History of Present Illness  63 yo female admitted with periprosthetic fracture L. s/p ORIF L hip with femoral revision 8/8. 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 Recent d/c from [facility] 08/06/13.   Clinical Impression  On eval, pt required Max assist +2 for mobility-able to stand for ~10 seconds at EOB with walker. Limited by pain, fatigue, weakness. Feel pt could benefit from continued rehab at CIR to regain strength and prior functional mobility level. Pt is very motivated to return to [facility]. If CIR is not an option, will need to pursue SNF.     Follow Up Recommendations [facility]    Equipment Recommendations  None recommended by PT    Recommendations for Other Services OT consult;Rehab consult     Precautions / Restrictions Precautions Precautions: Fall;Posterior Hip Precaution Comments: R AKA - no prosthesis. Reviewed/demonstrated hip precautions Restrictions Weight Bearing Restrictions: Yes LLE Weight Bearing: Weight bearing as tolerated      Mobility  Bed Mobility Overal bed mobility: Needs Assistance Bed Mobility: Supine to Sit;Sit to Supine     Supine to sit: Min assist Sit to supine: Min assist   General bed mobility comments: assist for L LE. Increased time. Utilized bedpad for scooting, positioning.   Transfers Overall transfer level: Needs assistance Equipment used: Rolling walker (2 wheeled) Transfers: Sit to/from Stand Sit to Stand: Max assist;+2 physical assistance;+2 safety/equipment;From elevated surface         General transfer comment: Assist to rise, stabilize, control descent. Multimodal cues for safety, technique, hand/L foot placement. x2. Unable to reach full upright position on 1st attempt due to pain in L LE. Sat EOB for ~3-4 minutes before 2nd attempt. On 2nd attempt, pt was able to stand for ~10 seconds-limited by pain, fatigue.    Ambulation/Gait             General Gait Details: Unable to attempt this session.   Stairs            Wheelchair Mobility    Modified Rankin (Stroke Patients Only)       Balance Overall balance assessment: Needs assistance;History of Falls Sitting-balance support: Bilateral upper extremity supported;Feet supported Sitting balance-Leahy Scale: Fair     Standing balance support: Bilateral upper extremity supported;During functional activity Standing balance-Leahy Scale: Poor                               Pertinent Vitals/Pain Pain Assessment: 0-10 Pain Score: 8  Pain Location: 8 with activity; 4 at rest L hip/thigh area Pain Intervention(s): Repositioned;Ice applied    Home Living Family/patient expects to be discharged to:: Unsure Living Arrangements: Spouse/significant other   Type of Home: House Home Access: Stairs to enter Entrance Stairs-Rails: None Entrance Stairs-Number of Steps: 1 step - pt reports step is difficult and was requiring assist from husband last few years Home Layout: One level   Additional Comments: has tub, high commodes--one higher than comfort height; doesn't have 24/7    Prior Function Level of Independence: Needs assistance               Hand Dominance        Extremity/Trunk Assessment   Upper Extremity Assessment: Overall WFL for tasks assessed           Lower Extremity Assessment: LLE deficits/detail RLE Deficits / Details: AKA LLE Deficits /  Details: hip flex 2/5, hip abd/add 2/5, knee ext 3/5, moves ankle well.   Cervical / Trunk Assessment: Normal  Communication   Communication: No difficulties  Cognition Arousal/Alertness: Awake/alert Behavior During Therapy: WFL for tasks assessed/performed Overall Cognitive Status: Within Functional Limits for tasks assessed                      General Comments      Exercises  Supine LE exercises Ankle pumps, 10 reps, L, active Quad  sets, 10 reps, L, active Heel Slides, 10 reps, L. Active-assist Hip Abd/add, 10 reps, L, Active-assist      Assessment/Plan    PT Assessment Patient needs continued PT services  PT Diagnosis Difficulty walking;Generalized weakness;Acute pain   PT Problem List Decreased strength;Decreased range of motion;Decreased activity tolerance;Decreased balance;Decreased mobility;Decreased knowledge of precautions;Decreased knowledge of use of DME;Pain  PT Treatment Interventions DME instruction;Gait training;Functional mobility training;Therapeutic activities;Patient/family education;Balance training;Therapeutic exercise   PT Goals (Current goals can be found in the Care Plan section) Acute Rehab PT Goals Patient Stated Goal: Rehab, walk, less pain.  PT Goal Formulation: With patient/family Time For Goal Achievement: 09/05/13 Potential to Achieve Goals: Good    Frequency Min 4X/week   Barriers to discharge Decreased caregiver support      Co-evaluation               End of Session Equipment Utilized During Treatment: Gait belt Activity Tolerance: Patient limited by fatigue;Patient limited by pain Patient left: in bed;with call bell/phone within reach;with family/visitor present           Time: 1003-1031 PT Time Calculation (min): 28 min   Charges:   PT Evaluation $Initial PT Evaluation Tier I: 1 Procedure PT Treatments $Therapeutic Activity: 23-37 mins   PT G Codes:          Weston Anna, MPT Pager: [phone]

## 2013-08-22 NOTE — Progress Notes (Addendum)
Subjective: 1 Day Post-Op Procedure(s) (LRB): OPEN REDUCTION INTERNAL FIXATION (ORIF) PERIPROSTHETIC FRACTURE WITH FEMORAL REVISION, LEFT FEMUR (Left) Patient reports pain as mild and moderate.   Patient seen in rounds with Dr. Wynelle Link.  Husband in room. Patient is having problems with pain in the hip, requiring pain medications We will start therapy today.  Plan is to look into CIR.  Objective: Vital signs in last 24 hours: Temp:  [97.5 F (36.4 C)-98.5 F (36.9 C)] 98 F (36.7 C) (08/09 0452) Pulse Rate:  [89-121] 89 (08/09 0452) Resp:  [10-18] 16 (08/09 0452) BP: (103-147)/(53-87) 123/58 mmHg (08/09 0452) SpO2:  [94 %-100 %] 99 % (08/09 0452)  Intake/Output from previous day:  Intake/Output Summary (Last 24 hours) at 08/22/13 0810 Last data filed at 08/22/13 7048  Gross per 24 hour  Intake   5103 ml  Output   4719 ml  Net    384 ml    Intake/Output this shift: Total I/O In: 120 [P.O.:120] Out: 230 [Urine:200; Drains:30]  Labs:  Recent Labs  08/19/13 1732 08/20/13 0450 08/21/13 0550 08/22/13 0430  HGB 8.5* 8.0* 10.5* 9.0*    Recent Labs  08/21/13 0550 08/22/13 0430  WBC 9.8 14.6*  RBC 3.59* 3.01*  HCT 32.7* 26.9*  PLT 464* 377    Recent Labs  08/21/13 0550 08/22/13 0430  NA 138 139  K 4.4 4.2  CL 101 104  CO2 27 26  BUN 12 9  CREATININE 0.95 0.74  GLUCOSE 123* 140*  CALCIUM 9.0 8.5    Recent Labs  08/19/13 1732  INR 1.11    EXAM General - Patient is Alert, Appropriate and Oriented Extremity - Neurovascular intact Sensation intact distally Dorsiflexion/Plantar flexion intact Dressing - dressing C/D/I Motor Function - intact, moving foot and toes well on exam.  Hemovac pulled without difficulty.  Past Medical History  Diagnosis Date  . Complication of anesthesia     NAUSEA  . Chest pain     pt states due to esophageal problem -  primary care MD aware   . Hives     since pneumonia vaccine   . Asthma   . Depression   .  Anxiety   . Fibromyalgia   . Spondylosis   . Bulging disc   . 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 Phantom limb pain     rt leg  . Leg pain, left   . Eczema     ears  . GERD (gastroesophageal reflux disease)     " alot of burping"  . Constipation, chronic   . Urgency incontinence   . History of transfusion   . Cancer     hx sarcoma rt knee with amputation  . Difficulty sleeping   . Sleep apnea     pt had recent sleep study done but does not know results - report called for and put on chart showing significant obstructive sleep apnea  . Hypertension   . Back pain 08/19/13    low back and  left leg pain due to nerve problems per pt  . PONV (postoperative nausea and vomiting)   . Peripheral neuropathy   . History of bronchitis   . History of pneumonia   . Hiatal hernia   . History of scarlet fever   . Postmenopausal   . History of measles   . History of mumps   . History of rubella     Assessment/Plan: 1  Day Post-Op Procedure(s) (LRB): OPEN REDUCTION INTERNAL FIXATION (ORIF) PERIPROSTHETIC FRACTURE WITH FEMORAL REVISION, LEFT FEMUR (Left) Active Problems:   Periprosthetic fracture around internal prosthetic left hip joint  Estimated body mass index is 28.34 kg/(m^2) as calculated from the following:   Height as of this encounter: 5\' 2"  (1.575 m).   Weight as of this encounter: 70.308 kg (155 lb). Advance diet Up with therapy  DVT Prophylaxis - Xarelto Weight Bearing As Tolerated left Leg D/C Knee Immobilizer Hemovac Pulled Begin Therapy Hip Preacutions  Arlee Muslim, PA-C Orthopaedic Surgery 08/22/2013, 8:10 AM

## 2013-08-22 NOTE — Clinical Social Work Placement (Signed)
Clinical Social Work Department CLINICAL SOCIAL WORK PLACEMENT NOTE 08/22/2013  Patient:  [name], [name]  Account Number:  [id number] Admit date:  08/19/2013  Clinical Social Worker:  Daiva Huge  Date/time:  08/22/2013 11:35 AM  Clinical Social Work is seeking post-discharge placement for this patient at the following level of care:   SKILLED NURSING   (*CSW will update this form in Epic as items are completed)   08/22/2013  Patient/family provided with [facility] Department of Clinical Social Work's list of facilities offering this level of care within the geographic area requested by the patient (or if unable, by the patient's family).  08/22/2013  Patient/family informed of their freedom to choose among providers that offer the needed level of care, that participate in Medicare, Medicaid or managed care program needed by the patient, have an available bed and are willing to accept the patient.  08/22/2013  Patient/family informed of [facility]' ownership interest in [facility], as well as of the fact that they are under no obligation to receive care at this facility.  PASARR submitted to EDS on 08/22/2013 PASARR number received on 08/22/2013  FL2 transmitted to all facilities in geographic area requested by pt/family on  08/22/2013 FL2 transmitted to all facilities within larger geographic area on   Patient informed that his/her managed care company has contracts with or will negotiate with  certain facilities, including the following:   Discussed SNF's in [facility] as an option as well as Sports administrator for [employer] and [employer].     Patient/family informed of bed offers received:   Patient chooses bed at  Physician recommends and patient chooses bed at    Patient to be transferred to  on   Patient to be transferred to facility by  Patient and family notified of transfer on  Name of family member notified:    The following physician  request were entered in Epic:   Additional Comments: Eduard Clos, MSW, [place] Weekend coverage

## 2013-08-22 NOTE — Progress Notes (Signed)
Sats 98% on RA.Pt refuses Cpap,states was also not using one at home,d/t it increased her anxiety level too much,and she denies need for a cpap. [name] D

## 2013-08-22 NOTE — Progress Notes (Signed)
Night nurse reported that pt stated that she "felt claustrophobic" with her knee immobilizer on. Pt also reported the same feeling to me. Pt's left leg was supported with blankets, and she was instructed not to bend her left hip or cross her legs. Will continue to monitor.

## 2013-08-22 NOTE — Discharge Instructions (Addendum)
Dr. Gaynelle Arabian Total Joint Specialist [facility] [address], [place], [place] [address] [phone]   TOTAL HIP REPLACEMENT POSTOPERATIVE DIRECTIONS    Hip Rehabilitation, Guidelines Following Surgery  The results of a hip operation are greatly improved after range of motion and muscle strengthening exercises. Follow all safety measures which are given to protect your hip. If any of these exercises cause increased pain or swelling in your joint, decrease the amount until you are comfortable again. Then slowly increase the exercises. Call your caregiver if you have problems or questions.  HOME CARE INSTRUCTIONS  Most of the following instructions are designed to prevent the dislocation of your new hip.  Remove items at home which could result in a fall. This includes throw rugs or furniture in walking pathways.  Continue medications as instructed at time of discharge.  You may have some home medications which will be placed on hold until you complete the course of blood thinner medication.  You may start showering once you are discharged home but do not submerge the incision under water. Just pat the incision dry and apply a dry gauze dressing on daily. Do not put on socks or shoes without following the instructions of your caregivers.  Sit on high chairs so your hips are not bent more than 90 degrees.  Sit on chairs with arms. Use the chair arms to help push yourself up when arising.  Keep your leg on the side of the operation out in front of you when standing up.  Arrange for the use of a toilet seat elevator so you are not sitting low.  Do not do any exercises or get in any positions that cause your toes to point in (pigeon toed).  Always sleep with a pillow between your legs. Do not lie on your side in sleep with both knees touching the bed.   Walk with walker as instructed.  You may resume a sexual relationship in one month or when given the OK by  your caregiver.  Use walker as long as suggested by your caregivers.  You may put full weight on your legs and walk as much as is comfortable. Avoid periods of inactivity such as sitting longer than an hour when not asleep. This helps prevent blood clots.  You may return to work once you are cleared by Engineer, production.  Do not drive a car for 6 weeks or until released by your surgeon.  Do not drive while taking narcotics.  Wear elastic stockings for three weeks following surgery during the day but you may remove then at night.  Make sure you keep all of your appointments after your operation with all of your doctors and caregivers. You should call the office at the above phone number and make an appointment for approximately two weeks after the date of your surgery. Change the dressing daily and reapply a dry dressing each time. Please pick up a stool softener and laxative for home use as long as you are requiring pain medications.  Continue to use ice on the hip for pain and swelling from surgery. You may notice swelling that will progress down to the foot and ankle.  This is normal after  surgery.  Elevate the leg when you are not up walking on it.   It is important for you to complete the blood thinner medication as prescribed by your doctor.  Continue to use the breathing machine which will help keep your temperature down.  It  is common for your temperature to cycle up and down following surgery, especially at night when you are not up moving around and exerting yourself.  The breathing machine keeps your lungs expanded and your temperature down.  RANGE OF MOTION AND STRENGTHENING EXERCISES  These exercises are designed to help you keep full movement of your hip joint. Follow your caregiver's or physical therapist's instructions. Perform all exercises about fifteen times, three times per day or as directed. Exercise both hips, even if you have had only one joint replacement. These exercises can be  done on a training (exercise) mat, on the floor, on a table or on a bed. Use whatever works the best and is most comfortable for you. Use music or television while you are exercising so that the exercises are a pleasant break in your day. This will make your life better with the exercises acting as a break in routine you can look forward to.  Lying on your back, slowly slide your foot toward your buttocks, raising your knee up off the floor. Then slowly slide your foot back down until your leg is straight again.  Lying on your back spread your legs as far apart as you can without causing discomfort.  Lying on your side, raise your upper leg and foot straight up from the floor as far as is comfortable. Slowly lower the leg and repeat.  Lying on your back, tighten up the muscle in the front of your thigh (quadriceps muscles). You can do this by keeping your leg straight and trying to raise your heel off the floor. This helps strengthen the largest muscle supporting your knee.  Lying on your back, tighten up the muscles of your buttocks both with the legs straight and with the knee bent at a comfortable angle while keeping your heel on the floor.   SKILLED REHAB INSTRUCTIONS: If the patient is transferred to a skilled rehab facility following release from the hospital, a list of the current medications will be sent to the facility for the patient to continue.  When discharged from the skilled rehab facility, please have the facility set up the patient's [place] prior to being released. Also, the skilled facility will be responsible for providing the patient with their medications at time of release from the facility to include their pain medication, the muscle relaxants, and their blood thinner medication. If the patient is still at the rehab facility at time of the two week follow up appointment, the skilled rehab facility will also need to assist the patient in arranging follow up  appointment in our office and any transportation needs.  MAKE SURE YOU:  Understand these instructions.  Will watch your condition.  Will get help right away if you are not doing well or get worse.  Pick up stool softner and laxative for home. Do not submerge incision under water. May shower. Continue to use ice for pain and swelling from surgery. Hip precautions.  Total Hip Protocol.  Take Xarelto for two and a half more weeks, then discontinue Xarelto. Once the patient has completed the Xarelto, they may resume the 81 mg Aspirin.  When discharged from the skilled rehab facility, please have the facility set up the patient's [place] prior to being released.  Also provide the patient with their medications at time of release from the facility to include their pain medication, the muscle relaxants, and their blood thinner medication.  If the patient is still at the  rehab facility at time of follow up appointment, please also assist the patient in arranging follow up appointment in our office and any transportation needs.    Information on my medicine - XARELTO (Rivaroxaban)  This medication education was reviewed with me or my healthcare representative as part of my discharge preparation.  The pharmacist that spoke with me during my hospital stay was:  Minda Ditto, [facility]  Why was Xarelto prescribed for you? Xarelto was prescribed for you to reduce the risk of blood clots forming after orthopedic surgery. The medical term for these abnormal blood clots is venous thromboembolism (VTE).  What do you need to know about xarelto ? Take your Xarelto ONCE DAILY at the same time every day. You may take it either with or without food.  If you have difficulty swallowing the tablet whole, you may crush it and mix in applesauce just prior to taking your dose.  Take Xarelto exactly as prescribed by your doctor and DO NOT stop taking Xarelto without talking to the  doctor who prescribed the medication.  Stopping without other VTE prevention medication to take the place of Xarelto may increase your risk of developing a clot.  After discharge, you should have regular check-up appointments with your healthcare provider that is prescribing your Xarelto.    What do you do if you miss a dose? If you miss a dose, take it as soon as you remember on the same day then continue your regularly scheduled once daily regimen the next day. Do not take two doses of Xarelto on the same day.   Important Safety Information A possible side effect of Xarelto is bleeding. You should call your healthcare provider right away if you experience any of the following:   Bleeding from an injury or your nose that does not stop.   Unusual colored urine (red or dark brown) or unusual colored stools (red or black).   Unusual bruising for unknown reasons.   A serious fall or if you hit your head (even if there is no bleeding).  Some medicines may interact with Xarelto and might increase your risk of bleeding while on Xarelto. To help avoid this, consult your healthcare provider or pharmacist prior to using any new prescription or non-prescription medications, including herbals, vitamins, non-steroidal anti-inflammatory drugs (NSAIDs) and supplements.  This website has more information on Xarelto: [email or web address]

## 2013-08-22 NOTE — Progress Notes (Signed)
08/22/2013 1700 NCM spoke to pt and states she was active with [place]. States plan is to go back to IP rehab. Waiting final dc recommendations. Jonnie Finner RN CCM Case Mgmt phone [phone]

## 2013-08-23 DIAGNOSIS — S72143A Displaced intertrochanteric fracture of unspecified femur, initial encounter for closed fracture: Secondary | ICD-10-CM

## 2013-08-23 LAB — BASIC METABOLIC PANEL
ANION GAP: 10 (ref 5–15)
BUN: 10 mg/dL (ref 6–23)
CHLORIDE: 101 meq/L (ref 96–112)
CO2: 25 mEq/L (ref 19–32)
Calcium: 8.4 mg/dL (ref 8.4–10.5)
Creatinine, Ser: 0.83 mg/dL (ref 0.50–1.10)
GFR calc non Af Amer: 74 mL/min — ABNORMAL LOW (ref >90)
GFR, EST AFRICAN AMERICAN: 86 mL/min — AB (ref >90)
Glucose, Bld: 109 mg/dL — ABNORMAL HIGH (ref 70–99)
Potassium: 4.2 mEq/L (ref 3.7–5.3)
SODIUM: 136 meq/L — AB (ref 137–147)

## 2013-08-23 LAB — CBC
HCT: 24.1 % — ABNORMAL LOW (ref 36.0–46.0)
Hemoglobin: 8 g/dL — ABNORMAL LOW (ref 12.0–15.0)
MCH: 30.2 pg (ref 26.0–34.0)
MCHC: 33.2 g/dL (ref 30.0–36.0)
MCV: 90.9 fL (ref 78.0–100.0)
PLATELETS: 331 10*3/uL (ref 150–400)
RBC: 2.65 MIL/uL — AB (ref 3.87–5.11)
RDW: 14.4 % (ref 11.5–15.5)
WBC: 16.3 10*3/uL — AB (ref 4.0–10.5)

## 2013-08-23 LAB — PREPARE RBC (CROSSMATCH)

## 2013-08-23 MED ORDER — ACETAMINOPHEN 325 MG PO TABS
650.0000 mg | ORAL_TABLET | Freq: Once | ORAL | Status: AC
  Administered 2013-08-23: 650 mg via ORAL
  Filled 2013-08-23: qty 2

## 2013-08-23 MED ORDER — SODIUM CHLORIDE 0.9 % IV SOLN: Freq: Once | INTRAVENOUS | Status: DC

## 2013-08-23 MED ORDER — FUROSEMIDE 10 MG/ML IJ SOLN
10.0000 mg | Freq: Once | INTRAMUSCULAR | Status: AC
  Administered 2013-08-23: 10 mg via INTRAVENOUS
  Filled 2013-08-23: qty 1

## 2013-08-23 NOTE — Progress Notes (Signed)
CARE MANAGE MENT UTILIZATION REVIEW NOTE 08/23/2013     Patient:  [name], [name]   Account Number:  [id number]  Documented by:  Suanne Marker Melanie Openshaw   Per Ur Regulation Reviewed for med. necessity/level of care/duration of stay

## 2013-08-23 NOTE — Progress Notes (Signed)
Subjective: 2 Days Post-Op Procedure(s) (LRB): OPEN REDUCTION INTERNAL FIXATION (ORIF) PERIPROSTHETIC FRACTURE WITH FEMORAL REVISION, LEFT FEMUR (Left) Patient reports pain as mild and moderate.   Patient seen in rounds by Dr. Wynelle Barnes. Patient is well, but has had some minor complaints of pain in the hip, requiring pain medications Plan is to look into CIR again following the hospital stay. CIR consult  Objective: Vital signs in last 24 hours: Temp:  [98 F (36.7 C)-100.9 F (38.3 C)] 100.9 F (38.3 C) (08/10 0611) Pulse Rate:  [93-113] 113 (08/10 0611) Resp:  [14-16] 16 (08/10 0611) BP: (93-139)/(42-60) 134/49 mmHg (08/10 0611) SpO2:  [97 %-99 %] 97 % (08/10 0611)  Intake/Output from previous day:  Intake/Output Summary (Last 24 hours) at 08/23/13 0740 Last data filed at 08/23/13 3664  Gross per 24 hour  Intake 3227.5 ml  Output   1480 ml  Net 1747.5 ml    Intake/Output this shift:    Labs:  Recent Labs  08/21/13 0550 08/22/13 0430 08/23/13 0509  HGB 10.5* 9.0* 8.0*    Recent Labs  08/22/13 0430 08/23/13 0509  WBC 14.6* 16.3*  RBC 3.01* 2.65*  HCT 26.9* 24.1*  PLT 377 331    Recent Labs  08/22/13 0430 08/23/13 0509  NA 139 136*  K 4.2 4.2  CL 104 101  CO2 26 25  BUN 9 10  CREATININE 0.74 0.83  GLUCOSE 140* 109*  CALCIUM 8.5 8.4   No results found for this basename: LABPT, INR,  in the last 72 hours  EXAM General - Patient is Alert and Appropriate Extremity - Neurovascular intact Sensation intact distally Dorsiflexion/Plantar flexion intact Dressing/Incision - clean, dry Motor Function - intact, moving foot and toes well on exam.   Past Medical History  Diagnosis Date  . Complication of anesthesia     NAUSEA  . Chest pain     pt states due to esophageal problem -  primary care MD aware   . Hives     since pneumonia vaccine   . Asthma   . Depression   . Anxiety   . Fibromyalgia   . Spondylosis   . Bulging disc   . 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 Phantom limb pain     rt leg  . Leg pain, left   . Eczema     ears  . GERD (gastroesophageal reflux disease)     " alot of burping"  . Constipation, chronic   . Urgency incontinence   . History of transfusion   . Cancer     hx sarcoma rt knee with amputation  . Difficulty sleeping   . Sleep apnea     pt had recent sleep study done but does not know results - report called for and put on chart showing significant obstructive sleep apnea  . Hypertension   . Back pain 08/19/13    low back and  left leg pain due to nerve problems per pt  . PONV (postoperative nausea and vomiting)   . Peripheral neuropathy   . History of bronchitis   . History of pneumonia   . Hiatal hernia   . History of scarlet fever   . Postmenopausal   . History of measles   . History of mumps   . History of rubella     Assessment/Plan: 2 Days Post-Op Procedure(s) (LRB): OPEN REDUCTION INTERNAL FIXATION (ORIF) PERIPROSTHETIC FRACTURE WITH FEMORAL REVISION, LEFT  FEMUR (Left) Active Problems:   Periprosthetic fracture around internal prosthetic left hip joint  Estimated body mass index is 28.34 kg/(m^2) as calculated from the following:   Height as of this encounter: 5\' 2"  (1.575 m).   Weight as of this encounter: 70.308 kg (155 lb). Up with therapy Discharge to CIR if candidate  DVT Prophylaxis - Xarelto Weight Bearing As Tolerated left Leg  Arlee Muslim, PA-C Orthopaedic Surgery 08/23/2013, 7:40 AM

## 2013-08-23 NOTE — Consult Note (Signed)
Physical Medicine and Rehabilitation Consult Reason for Consult: Left periprosthetic femur fracture Referring Physician: Dr.Alusio   HPI: [name] is a 63 y.o. 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 Patient well known to rehabilitation services from left total hip or placement 07/28/2013 and was discharged home with home therapies/weightbearing as tolerated. Presented 08/19/2013 as patient had been doing well at home after recent hip replacement when she was in the bathroom and felt a pop in her left hip with increasing pain. X-rays and imaging showed left greater trochanteric nondisplaced periprosthetic femur fracture. Underwent ORIF with femoral revision 08/21/2013 per Dr.Alusio. Hospital course pain management. Maintained on Xarelto for DVT prophylaxis. Hospital course acute blood loss anemia of 8.0 and monitored. Physical therapy evaluation completed 08/22/2013 with recommendations for physical medicine rehabilitation consult.  Patient has had a lot of pain today has not been out of bed yet. Patient states that she was in bed for about one week prior to seeking medical attention after her hip popped a couple days after discharge from [facility]. IR Review of Systems  Gastrointestinal: Positive for constipation.       GERD  Genitourinary:       Occasional urinary incontinence  Musculoskeletal: Positive for back pain.  Psychiatric/Behavioral: Positive for depression. The patient has insomnia.        Anxiety  All other systems reviewed and are negative.  Past Medical History  Diagnosis Date  . Complication of anesthesia     NAUSEA  . Chest pain     pt states due to esophageal problem -  primary care MD aware   . Hives     since pneumonia vaccine   . Asthma   . Depression   . Anxiety   . Fibromyalgia   . Spondylosis   . Bulging disc   . 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 Phantom  limb pain     rt leg  . Leg pain, left   . Eczema     ears  . GERD (gastroesophageal reflux disease)     " alot of burping"  . Constipation, chronic   . Urgency incontinence   . History of transfusion   . Cancer     hx sarcoma rt knee with amputation  . Difficulty sleeping   . Sleep apnea     pt had recent sleep study done but does not know results - report called for and put on chart showing significant obstructive sleep apnea  . Hypertension   . Back pain 08/19/13    low back and  left leg pain due to nerve problems per pt  . PONV (postoperative nausea and vomiting)   . Peripheral neuropathy   . History of bronchitis   . History of pneumonia   . Hiatal hernia   . History of scarlet fever   . Postmenopausal   . History of measles   . History of mumps   . History of rubella    Past Surgical History  Procedure Laterality Date  . Abdominal hysterectomy  1980'S  . Sarcoma  1973    RT KNEE (aka)  . External ear surgery  AGE 28  . Total hip arthroplasty Left 07/28/2013    Procedure: LEFT TOTAL HIP ARTHROPLASTY ANTERIOR APPROACH;  Surgeon: [name], MD;  Location: [facility];  Service: Orthopedics;  Laterality: Left;  Family History  Problem Relation Age of Onset  . CAD Sister     CABG, stents  . CAD Father    Social History:  reports that she quit smoking about 6 years ago. Her smoking use included Cigarettes. She smoked 0.00 packs per day. She has never used smokeless tobacco. She reports that she does not drink alcohol or use illicit drugs. Allergies:  Allergies  Allergen Reactions  . Bactrim [Sulfamethoxazole-Tmp Ds] Shortness Of Breath  . Cefuroxime Shortness Of Breath  . Omnipaque [Iohexol] Shortness Of Breath    Patient stated that she had a reaction a few years ago to the IV dye and had trouble breathing--so for her CT Angio Chest today (07/12/13), ER MD gave her solumedrol and Benadryl before she had test and did very well with that.  ak 07/12/13  . Pneumococcal  Vaccines Hives    Hives on arm after shot  . Penicillins Diarrhea  . Sulfa Antibiotics Other (See Comments)    unknown   Medications Prior to Admission  Medication Sig Dispense Refill  . albuterol (PROVENTIL HFA;VENTOLIN HFA) 108 (90 BASE) MCG/ACT inhaler Inhale 2 puffs into the lungs every 6 (six) hours as needed for wheezing or shortness of breath.      Marland Kitchen aspirin 81 MG tablet Take 81 mg by mouth daily.      Marland Kitchen buPROPion (WELLBUTRIN SR) 150 MG 12 hr tablet Take 150 mg by mouth 2 (two) times daily.      . busPIRone (BUSPAR) 10 MG tablet Take 10 mg by mouth 2 (two) times daily.      . citalopram (CELEXA) 10 MG tablet Take 10 mg by mouth every morning.      . cyclobenzaprine (FLEXERIL) 10 MG tablet Take 10 mg by mouth 2 (two) times daily.       Marland Kitchen doxepin (SINEQUAN) 10 MG capsule Take 10 mg by mouth at bedtime.      Marland Kitchen esomeprazole (NEXIUM) 20 MG capsule Take 1 capsule (20 mg total) by mouth 2 (two) times daily before a meal.  60 capsule  0  . fexofenadine (ALLEGRA) 180 MG tablet Take 180 mg by mouth daily.      . fluocinolone (SYNALAR) 0.01 % external solution Place 2 drops into both ears 2 (two) times daily as needed (itching).      Marland Kitchen HYDROmorphone (DILAUDID) 2 MG tablet Use one pill every 8 hours as needed for severe pain  30 tablet  0  . irbesartan (AVAPRO) 150 MG tablet Take 150 mg by mouth 2 (two) times daily.      . iron polysaccharides (NIFEREX) 150 MG capsule Take 1 capsule (150 mg total) by mouth 2 (two) times daily.  60 capsule  1  . oxybutynin (DITROPAN) 5 MG tablet Take 1 tablet (5 mg total) by mouth 2 (two) times daily. For hyperactive bladder  60 tablet  1  . polyethylene glycol (MIRALAX / GLYCOLAX) packet Take 17 g by mouth 2 (two) times daily.  14 each  0  . diphenhydrAMINE (BENADRYL) 25 MG tablet Take 1 tablet (25 mg total) by mouth every 6 (six) hours as needed for itching.  30 tablet  0    Home: Home Living Family/patient expects to be discharged to:: Unsure Living  Arrangements: Spouse/significant other Type of Home: House Home Access: Stairs to enter [employer] of Steps: 1 step - pt reports step is difficult and was requiring assist from husband last few years Entrance Stairs-Rails: None Home Layout: One level  Additional Comments: has tub, high commodes--one higher than comfort height; doesn't have 24/7  Lives With: Spouse  Functional History: Prior Function Level of Independence: Needs assistance Functional Status:  Mobility: Bed Mobility Overal bed mobility: Needs Assistance Bed Mobility: Supine to Sit;Sit to Supine Supine to sit: Min assist Sit to supine: Min assist General bed mobility comments: assist for L LE. Increased time. Utilized bedpad for scooting, positioning.  Transfers Overall transfer level: Needs assistance Equipment used: Rolling walker (2 wheeled) Transfers: Sit to/from Stand Sit to Stand: Max assist;+2 physical assistance;+2 safety/equipment;From elevated surface General transfer comment: Assist to rise, stabilize, control descent. Multimodal cues for safety, technique, hand/L foot placement. x2. Unable to reach full upright position on 1st attempt due to pain in L LE. Sat EOB for ~3-4 minutes before 2nd attempt. On 2nd attempt, pt was able to stand for ~10 seconds-limited by pain, fatigue.  Ambulation/Gait General Gait Details: Unable to attempt this session.     ADL:    Cognition: Cognition Overall Cognitive Status: Within Functional Limits for tasks assessed Orientation Level: Oriented X4 Cognition Arousal/Alertness: Awake/alert Behavior During Therapy: WFL for tasks assessed/performed Overall Cognitive Status: Within Functional Limits for tasks assessed  Blood pressure 134/49, pulse 113, temperature 100.9 F (38.3 C), temperature source Oral, resp. rate 16, height 5\' 2"  (1.575 m), weight 70.308 kg (155 lb), SpO2 97.00%. Physical Exam  Constitutional: She is oriented to person, place, and time.  She appears well-developed.  HENT:  Head: Normocephalic.  Eyes: EOM are normal.  Neck: Normal range of motion. Neck supple. No thyromegaly present.  Cardiovascular: Normal rate and regular rhythm.   Respiratory: Effort normal and breath sounds normal. No respiratory distress.  GI: Bowel sounds are normal. She exhibits no distension.  Neurological: She is alert and oriented to person, place, and time.  Skin:  AKA site is well-healed. Surgical site left hip clean and dry and appropriately tender   motor strength is 5/5 bilateral deltoid, bicep, tricep, grip Right AKA 4/5 hip flexion Left hip flexion trace knee extension trace ankle dorsiflexion plantar flexion 3 minus inhibitedby pain  Results for orders placed during the hospital encounter of 08/19/13 (from the past 24 hour(s))  CBC     Status: Abnormal   Collection Time    08/23/13  5:09 AM      Result Value Ref Range   WBC 16.3 (*) 4.0 - 10.5 K/uL   RBC 2.65 (*) 3.87 - 5.11 MIL/uL   Hemoglobin 8.0 (*) 12.0 - 15.0 g/dL   HCT 24.1 (*) 36.0 - 46.0 %   MCV 90.9  78.0 - 100.0 fL   MCH 30.2  26.0 - 34.0 pg   MCHC 33.2  30.0 - 36.0 g/dL   RDW 14.4  11.5 - 15.5 %   Platelets 331  150 - 400 K/uL  BASIC METABOLIC PANEL     Status: Abnormal   Collection Time    08/23/13  5:09 AM      Result Value Ref Range   Sodium 136 (*) 137 - 147 mEq/L   Potassium 4.2  3.7 - 5.3 mEq/L   Chloride 101  96 - 112 mEq/L   CO2 25  19 - 32 mEq/L   Glucose, Bld 109 (*) 70 - 99 mg/dL   BUN 10  6 - 23 mg/dL   Creatinine, Ser 0.83  0.50 - 1.10 mg/dL   Calcium 8.4  8.4 - 10.5 mg/dL   GFR calc non Af Amer 74 (*) >90 mL/min  GFR calc Af Amer 86 (*) >90 mL/min   Anion gap 10  5 - 15   Dg Pelvis Portable  08/21/2013   CLINICAL DATA:  Postop ORIF left hip fracture  EXAM: PORTABLE PELVIS 1-2 VIEWS  COMPARISON:  07/28/2013  FINDINGS: Cerclage wire fixation of the left greater trochanter.  Left total hip arthroplasty.  Status post amputation at the level of the   right proximal femur.  Visualized bony pelvis appears intact.  IMPRESSION: Cerclage wire fixation of the left greater trochanter.  Left total hip arthroplasty.   Electronically Signed   By: Julian Hy M.D.   On: 08/21/2013 14:32    Assessment/Plan: Diagnosis: Periprosthetic left greater trochanter fracture status post femoral component revision plus ORIF 1. Does the need for close, 24 hr/day medical supervision in concert with the patient's rehab needs make it unreasonable for this patient to be served in a less intensive setting? Yes 2. Co-Morbidities requiring supervision/potential complications: Right above knee amputation  3. Due to bladder management, bowel management, safety, skin/wound care, disease management, medication administration and pain management, does the patient require 24 hr/day rehab nursing? Yes 4. Does the patient require coordinated care of a physician, rehab nurse, PT (1-2 hrs/day, 5 days/week) and OT (1-2 hrs/day, 5 days/week) to address physical and functional deficits in the context of the above medical diagnosis(es)? Yes Addressing deficits in the following areas: balance, endurance, locomotion, strength, transferring, bowel/bladder control, bathing, dressing, feeding, grooming and toileting 5. Can the patient actively participate in an intensive therapy program of at least 3 hrs of therapy per day at least 5 days per week? Yes 6. The potential for patient to make measurable gains while on inpatient rehab is good 7. Anticipated functional outcomes upon discharge from inpatient rehab are supervision  with PT, supervision with OT, n/a with SLP. 8. Estimated rehab length of stay to reach the above functional goals is: 10-12 days 9. Does the patient have adequate social supports to accommodate these discharge functional goals? Potentially 10. Anticipated D/C setting: Home 11. Anticipated post D/C treatments: [place] therapy 12. Overall Rehab/Functional Prognosis:  good  RECOMMENDATIONS: This patient's condition is appropriate for continued rehabilitative care in the following setting: CIR Patient has agreed to participate in recommended program. Yes Note that insurance prior authorization may be required for reimbursement for recommended care.  Comment: Pain control continues to be an issue    08/23/2013

## 2013-08-23 NOTE — Progress Notes (Signed)
Physical Therapy Treatment Patient Details Name: [name] MRN: [id number] DOB: 01-26-1950 Today's Date: 08/23/2013    History of Present Illness 63 yo female admitted with periprosthetic fracture L. s/p ORIF L hip with femoral revision 8/8. 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 Recent d/c from CIR 08/06/13.     PT Comments    Progressing slowly with mobility. Pt able to stand for longer periods with walker x 2 this session. Not quite able to hop, pivot yet. Continue to recommend CIR. Noted edema L LE-elevated and applied ice.   Follow Up Recommendations  CIR     Equipment Recommendations  None recommended by PT    Recommendations for Other Services OT consult     Precautions / Restrictions Precautions Precautions: Posterior Hip;Fall Precaution Comments: R AKA - no prosthesis. Reviewed/demonstrated hip precautions Restrictions Weight Bearing Restrictions: Yes LLE Weight Bearing: Weight bearing as tolerated    Mobility  Bed Mobility Overal bed mobility: Needs Assistance Bed Mobility: Supine to Sit;Sit to Supine     Supine to sit: Min assist Sit to supine: Min assist   General bed mobility comments: assist for L LE. Increased time. Utilized bedpad for scooting, positioning.   Transfers Overall transfer level: Needs assistance Equipment used: Rolling walker (2 wheeled) Transfers: Sit to/from Stand Sit to Stand: Mod assist;+2 physical assistance;+2 safety/equipment;From elevated surface         General transfer comment: Assist to rise, stabilize, control descent. Sit to stand x2 at EOB with RW-pt able to stand for ~20-30 seconds each attempt. Pt unable to pivot or hop just yet.   Ambulation/Gait                 Stairs            Wheelchair Mobility    Modified Rankin (Stroke Patients Only)       Balance                                    Cognition Arousal/Alertness: Awake/alert Behavior During Therapy: WFL for tasks  assessed/performed Overall Cognitive Status: Within Functional Limits for tasks assessed                      Exercises Total Joint Exercises Ankle Circles/Pumps: AROM;Left;15 reps;Supine Quad Sets: AROM;Left;Supine;15 reps Heel Slides: AAROM;Left;15 reps;Supine Hip ABduction/ADduction: AAROM;Left;15 reps;Supine    General Comments        Pertinent Vitals/Pain Pain Assessment: 0-10 Pain Score: 7  Pain Location: L hip, thigh area Pain Intervention(s): Repositioned;Limited activity within patient's tolerance    Home Living                      Prior Function            PT Goals (current goals can now be found in the care plan section) Progress towards PT goals: Progressing toward goals (slowly)    Frequency  Min 4X/week    PT Plan Current plan remains appropriate    Co-evaluation             End of Session Equipment Utilized During Treatment: Gait belt Activity Tolerance: Patient limited by fatigue;Patient limited by pain Patient left: in bed;with call bell/phone within reach     Time: 1535-1602 PT Time Calculation (min): 27 min  Charges:  $Therapeutic Exercise: 8-22 mins $Therapeutic Activity: 8-22 mins  G Codes:      Weston Anna, MPT Pager: [phone]

## 2013-08-23 NOTE — Evaluation (Signed)
Occupational Therapy Evaluation Patient Details Name: [name] MRN: [id number] DOB: May 29, 1950 Today's Date: 08/23/2013    History of Present Illness 63 yo female admitted with periprosthetic fracture L. s/p ORIF L hip with femoral revision 8/8. 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 Recent d/c from CIR 08/06/13.    Clinical Impression   Pt presents to OT with decreased I with ADL activity s/p hip surgery and presents to OT with decreased I with ADL activity due to problems listed below. Pt will benefit from skilled OT to increase I with ADL activity and return to PLOF     Follow Up Recommendations  CIR    Equipment Recommendations  None recommended by OT       Precautions / Restrictions Precautions Precautions: Fall;Posterior Hip Precaution Comments: R AKA - no prosthesis. Reviewed/demonstrated hip precautions Restrictions Weight Bearing Restrictions: Yes LLE Weight Bearing: Non weight bearing      Mobility Bed Mobility Overal bed mobility: Needs Assistance Bed Mobility: Supine to Sit     Supine to sit: Min assist Sit to supine: Min assist      Transfers                 General transfer comment: did not perform transfer this OT visit         ADL Overall ADL's : Needs assistance/impaired     Grooming: Set up;Sitting   Upper Body Bathing: Set up;Sitting               Toilet Transfer: Adhering to hip precautions Toilet Transfer Details (indicate cue type and reason): bed pan - max A Toileting- Clothing Manipulation and Hygiene: Bed level;Adhering to hip precautions         General ADL Comments: pt sat EOB and then felt as if she needed to use the rest room.  Pt did not feel she could get to [facility] so opted for bed pan which pt did very well with .  Edcuated pt in hip precautions using bed pan .               Pertinent Vitals/Pain Pain Score: 7  Pain Location: L hip thigh area Pain Descriptors / Indicators: Aching Pain Intervention(s):  Repositioned;Ice applied     Hand Dominance     Extremity/Trunk Assessment Upper Extremity Assessment Upper Extremity Assessment: Overall WFL for tasks assessed           Communication Communication Communication: No difficulties   Cognition Arousal/Alertness: Awake/alert Behavior During Therapy: WFL for tasks assessed/performed Overall Cognitive Status: Within Functional Limits for tasks assessed                     General Comments   pts BP 125/52 sitting EOB            Home Living Family/patient expects to be discharged to:: Unsure Living Arrangements: Spouse/significant other   Type of Home: House Home Access: Stairs to enter [employer] of Steps: 1 step - pt reports step is difficult and was requiring assist from husband last few years Entrance Stairs-Rails: None Home Layout: One level     Bathroom Shower/Tub: Teacher, early years/pre: Handicapped height   How Accessible: Accessible via walker     Additional Comments: has tub, high commodes--one higher than comfort height; doesn't have 24/7  Lives With: Spouse    Prior Functioning/Environment Level of Independence: Needs assistance        Comments: used crutches but has been  very sedentary x ~2 yrs 2* hip pain    OT Diagnosis: Generalized weakness   OT Problem List: Decreased activity tolerance;Decreased cognition;Decreased knowledge of use of DME or AE;Pain;Impaired balance (sitting and/or standing)   OT Treatment/Interventions: Self-care/ADL training;DME and/or AE instruction;Balance training;Patient/family education    OT Goals(Current goals can be found in the care plan section) Acute Rehab OT Goals Patient Stated Goal: Rehab, walk, less pain.   OT Frequency: Min 2X/week              End of Session Nurse Communication: Mobility status  Activity Tolerance: Patient limited by fatigue Patient left: in bed;with call bell/phone within reach;with family/visitor  present   Time: 1040-1110 OT Time Calculation (min): 30 min Charges:  OT General Charges $OT Visit: 1 Procedure OT Evaluation $Initial OT Evaluation Tier I: 1 Procedure OT Treatments $Self Care/Home Management : 23-37 mins G-Codes:    Payton Mccallum D 2013-09-08, 11:20 AM

## 2013-08-23 NOTE — Progress Notes (Signed)
Rehab Admissions Coordinator Note:  Patient was screened by Retta Diones for appropriateness for an Inpatient Acute Rehab Consult.  At this time, an inpatient rehab consult has been ordered and is pending completion.  Once consult is completed, an admissions coordinator will follow up.   Retta Diones 08/23/2013, 8:57 AM  I can be reached at [phone].

## 2013-08-23 NOTE — Progress Notes (Addendum)
Inpatient Rehabilitation  I met with Mrs. [name] and her daughter Amanda at the bedside to discuss her post acute rehab options.  I explained to them that Dr. Kirsteins believes pt. will need 24 hour supervision at time of DC home from rehab if she were to come.  Daughter and patient state that husband cannot take any more time from work and she will not have 24 hour assist.  Daughter also mentions that she believes her mom will be best served in a SNF due to lower level of intensity of therapies as well as longer length of stay.  I will plan to sign off as pt. and daughter want SW to pursue SNF for rehab.  Please call if questions.  I discussed with pt's RN Amy.    Susan Blankenship PT Inpatient Rehab Admissions Coordinator Cell [phone] Office [phone]  

## 2013-08-24 ENCOUNTER — Encounter (HOSPITAL_COMMUNITY): Payer: Self-pay | Admitting: Orthopedic Surgery

## 2013-08-24 DIAGNOSIS — Z9289 Personal history of other medical treatment: Secondary | ICD-10-CM

## 2013-08-24 DIAGNOSIS — D649 Anemia, unspecified: Secondary | ICD-10-CM | POA: Diagnosis not present

## 2013-08-24 DIAGNOSIS — E871 Hypo-osmolality and hyponatremia: Secondary | ICD-10-CM

## 2013-08-24 LAB — TYPE AND SCREEN
ABO/RH(D): O POS
ANTIBODY SCREEN: NEGATIVE
UNIT DIVISION: 0
UNIT DIVISION: 0
UNIT DIVISION: 0
Unit division: 0
Unit division: 0
Unit division: 0

## 2013-08-24 LAB — BASIC METABOLIC PANEL
ANION GAP: 10 (ref 5–15)
BUN: 9 mg/dL (ref 6–23)
CO2: 28 meq/L (ref 19–32)
Calcium: 8.6 mg/dL (ref 8.4–10.5)
Chloride: 101 mEq/L (ref 96–112)
Creatinine, Ser: 0.83 mg/dL (ref 0.50–1.10)
GFR calc Af Amer: 86 mL/min — ABNORMAL LOW (ref >90)
GFR calc non Af Amer: 74 mL/min — ABNORMAL LOW (ref >90)
GLUCOSE: 107 mg/dL — AB (ref 70–99)
Potassium: 4.2 mEq/L (ref 3.7–5.3)
SODIUM: 139 meq/L (ref 137–147)

## 2013-08-24 LAB — CBC
HCT: 30.2 % — ABNORMAL LOW (ref 36.0–46.0)
Hemoglobin: 10 g/dL — ABNORMAL LOW (ref 12.0–15.0)
MCH: 29.4 pg (ref 26.0–34.0)
MCHC: 33.1 g/dL (ref 30.0–36.0)
MCV: 88.8 fL (ref 78.0–100.0)
PLATELETS: 343 10*3/uL (ref 150–400)
RBC: 3.4 MIL/uL — AB (ref 3.87–5.11)
RDW: 14.8 % (ref 11.5–15.5)
WBC: 14.3 10*3/uL — ABNORMAL HIGH (ref 4.0–10.5)

## 2013-08-24 NOTE — Progress Notes (Signed)
CSW assisting with d/c planning. Family has contacted the [facility] in [place] requesting rehab placement. CSW has sent clinicals to SNF and expects to hear if they are able to offer placement shortly.  Werner Lean LCSW [phone]

## 2013-08-24 NOTE — Progress Notes (Addendum)
Physical Therapy Treatment Patient Details Name: [name] MRN: [id number] DOB: March 02, 1950 Today's Date: 08/24/2013    History of Present Illness 63 yo female admitted with periprosthetic fracture L. s/p ORIF L hip with femoral revision 8/8. 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 Recent d/c from CIR 08/06/13.     PT Comments    Assisted pt OOB to [facility] without RW having pt perform squat pivot + 2 Mod assist.  Assisted with sit to stand from [facility] + 2 Max assist using RW.  Assisted with hygiene.  Pt was able to take 3 "hops" staps backward to recliner.  Very anxious/nervous.  Required increased time.  Positioned in recliner.   Follow Up Recommendations        Equipment Recommendations       Recommendations for Other Services       Precautions / Restrictions      Mobility  Bed Mobility    Max assist + 2              Transfers    Max assit + 2  (see above)                Ambulation/Gait     3 "hops" steps backward from [facility] to recliner. Very unsteady Very nervous              Stairs            Wheelchair Mobility    Modified Rankin (Stroke Patients Only)       Balance                                    Cognition                            Exercises   Total Hip Replacement TE's 10 reps ankle pumps 10 reps knee presses 10 reps heel slides 10 reps SAQ's 10 reps ABD Followed by ICE     General Comments        Pertinent Vitals/Pain      Home Living                      Prior Function            PT Goals (current goals can now be found in the care plan section)      Frequency       PT Plan      Co-evaluation             End of Session           Time: 6226-3335 PT Time Calculation (min): 28 min  Charges:  $Therapeutic Exercise: 8-22 mins $Therapeutic Activity: 8-22 mins                    G Codes:      Rica Koyanagi  PTA [facility]  Acute  Rehab Pager      [phone]

## 2013-08-24 NOTE — Progress Notes (Signed)
Pt refused CPAP for tonight. RN notified. 

## 2013-08-24 NOTE — Progress Notes (Signed)
CSW assisting with d/c planning. PN reviewed. SNF search has been initiated and [employer] bed offers provided. No bed offers received from [facility]. CSW will meet with pt again today after she reviews bed offers with family to continue assisting with d/c planning.  Werner Lean LCSW [phone]

## 2013-08-24 NOTE — Progress Notes (Signed)
Physical Therapy Treatment Patient Details Name: [name] MRN: [id number] DOB: 1950-07-14 Today's Date: 08/24/2013    History of Present Illness 63 yo female admitted with periprosthetic fracture L. s/p ORIF L hip with femoral revision 8/8. 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 Recent d/c from CIR 08/06/13.     PT Comments    PM session.  Assisted pt from recliner to [facility] + 2 Max assist squat pivot.  Assisted from sit to stand + 2 Max assist with RW and assisted with hygiene.  !/4 turn from [facility] back to bed + 2 Max assist with uncontrolled decsend.   Follow Up Recommendations        Equipment Recommendations       Recommendations for Other Services       Precautions / Restrictions      Mobility  Bed Mobility    max assit              Transfers  max assist + 2                  Ambulation/Gait    transferred only this afternoon.  Too fatigue to attempt.             Stairs            Wheelchair Mobility    Modified Rankin (Stroke Patients Only)       Balance                                    Cognition                            Exercises      General Comments        Pertinent Vitals/Pain      Home Living                      Prior Function            PT Goals (current goals can now be found in the care plan section)      Frequency       PT Plan      Co-evaluation             End of Session           Time: 9244-6286 PT Time Calculation (min): 28 min  Charges:  $Therapeutic Activity: 23-37 mins                    G Codes:      Rica Koyanagi  PTA [facility]  Acute  Rehab Pager      [phone]

## 2013-08-24 NOTE — Progress Notes (Signed)
Subjective: 3 Days Post-Op Procedure(s) (LRB): OPEN REDUCTION INTERNAL FIXATION (ORIF) PERIPROSTHETIC FRACTURE WITH FEMORAL REVISION, LEFT FEMUR (Left) Patient reports pain as mild.   Patient seen in rounds with Dr. Wynelle Link. Feeling a little better today after the blood and did get up OOB yesterday. Patient is well, but has had some minor complaints of pain in the hip, requiring pain medications Plan is to go Skilled nursing facility after hospital stay. Not a candidate for CIR.  Felt that she would still need 24 hour supervision at time of discharge. Social worker looking into other options: CSW assisting with d/c planning. PN reviewed. SNF search has been initiated and [employer] bed offers provided. No bed offers received from [facility]. CSW will meet with pt again today after she reviews bed offers with family to continue assisting with d/c planning.  Werner Lean LCSW [phone]  Objective: Vital signs in last 24 hours: Temp:  [97.7 F (36.5 C)-99.8 F (37.7 C)] 99.8 F (37.7 C) (08/11 0449) Pulse Rate:  [93-102] 98 (08/11 0449) Resp:  [15-16] 16 (08/11 0449) BP: (96-127)/(45-73) 127/53 mmHg (08/11 0449) SpO2:  [96 %-98 %] 96 % (08/11 0449)  Intake/Output from previous day:  Intake/Output Summary (Last 24 hours) at 08/24/13 0916 Last data filed at 08/24/13 0542  Gross per 24 hour  Intake 1001.67 ml  Output   3700 ml  Net -2698.33 ml    Intake/Output this shift:    Labs:  Recent Labs  08/22/13 0430 08/23/13 0509 08/24/13 0438  HGB 9.0* 8.0* 10.0*    Recent Labs  08/23/13 0509 08/24/13 0438  WBC 16.3* 14.3*  RBC 2.65* 3.40*  HCT 24.1* 30.2*  PLT 331 343    Recent Labs  08/23/13 0509 08/24/13 0438  NA 136* 139  K 4.2 4.2  CL 101 101  CO2 25 28  BUN 10 9  CREATININE 0.83 0.83  GLUCOSE 109* 107*  CALCIUM 8.4 8.6   No results found for this basename: LABPT, INR,  in the last 72 hours  EXAM General - Patient is Alert,  Appropriate and Oriented Extremity - Neurovascular intact Sensation intact distally Dorsiflexion/Plantar flexion intact Dressing/Incision - clean, dry, no drainage, healing Motor Function - intact, moving foot and toes well on exam.   Past Medical History  Diagnosis Date  . Complication of anesthesia     NAUSEA  . Chest pain     pt states due to esophageal problem -  primary care MD aware   . Hives     since pneumonia vaccine   . Asthma   . Depression   . Anxiety   . Fibromyalgia   . Spondylosis   . Bulging disc   . 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 Phantom limb pain     rt leg  . Leg pain, left   . Eczema     ears  . GERD (gastroesophageal reflux disease)     " alot of burping"  . Constipation, chronic   . Urgency incontinence   . History of transfusion   . Cancer     hx sarcoma rt knee with amputation  . Difficulty sleeping   . Sleep apnea     pt had recent sleep study done but does not know results - report called for and put on chart showing significant obstructive sleep apnea  . Hypertension   . Back pain 08/19/13    low back and  left leg pain due to nerve problems per pt  . PONV (postoperative nausea and vomiting)   . Peripheral neuropathy   . History of bronchitis   . History of pneumonia   . Hiatal hernia   . History of scarlet fever   . Postmenopausal   . History of measles   . History of mumps   . 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   Weight as of this encounter: 70.308 kg (155  lb). Up with therapy Discharge to SNF once bed is located.  DVT Prophylaxis - Xarelto Weight Bearing As Tolerated left Leg HGB back up to 10 following blood yesterday.  Arlee Muslim, PA-C Orthopaedic Surgery 08/24/2013, 9:16 AM

## 2013-08-25 MED ORDER — MAGIC MOUTHWASH
5.0000 mL | Freq: Three times a day (TID) | ORAL | Status: DC
  Administered 2013-08-25 – 2013-08-27 (×6): 5 mL via ORAL
  Filled 2013-08-25 (×10): qty 5

## 2013-08-25 MED ORDER — HYDROMORPHONE HCL 2 MG PO TABS
1.0000 mg | ORAL_TABLET | ORAL | Status: DC | PRN
  Administered 2013-08-25: 2 mg via ORAL
  Administered 2013-08-25: 1 mg via ORAL
  Administered 2013-08-25 – 2013-08-26 (×3): 2 mg via ORAL
  Administered 2013-08-26: 1 mg via ORAL
  Administered 2013-08-26 – 2013-08-27 (×3): 2 mg via ORAL
  Filled 2013-08-25 (×9): qty 1

## 2013-08-25 NOTE — Progress Notes (Addendum)
Physical Therapy Treatment Patient Details Name: [name] MRN: [id number] DOB: 14-Nov-1950 Today's Date: 08/25/2013    History of Present Illness 63 yo female admitted with periprosthetic fracture L. s/p ORIF L hip with femoral revision 8/8. 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 Recent d/c from CIR 08/06/13.     PT Comments    POD # 4 L ORIF.  Assisted pt OOB to w/c for transfer training and wc mobility.  Pt progressing slowly and still requires + 2 assist.  Pt will need ST Rehab at SNF prior to D/C to home.  Follow Up Recommendations   SNF     Equipment Recommendations       Recommendations for Other Services       Precautions / Restrictions Precautions Precautions: Posterior Hip;Fall Precaution Booklet Issued: Yes (comment) Precaution Comments: post precautions, R AKA without prosthesis Restrictions Weight Bearing Restrictions: Yes LLE Weight Bearing: Weight bearing as tolerated    Mobility  Bed Mobility Overal bed mobility: Needs Assistance Bed Mobility: Supine to Sit     Supine to sit: Mod assist;Max assist Sit to supine: Min assist   General bed mobility comments: assist for L LE and use of pad to position hips, required extra time and heavy reliance on rail.  Sat EOB x 6 min at [employer] assist before c/o fatigue.  Transfers Overall transfer level: Needs assistance Equipment used: None Transfers: Set designer Transfers;Lateral/Scoot Transfers (to pt's L)     Squat pivot transfers: Mod assist;Max assist    Lateral/Scoot Transfers: Mod assist;Max assist General transfer comment: Total assist for set up to place wc on pt's L at 1/4 turn.  Locked brakes and removed arm rests.  Pt was able to partially squat pivot/lateral scoot from bed to w/c. Required asssit tyo guide hips and to ensure THP.  Ambulation/Gait                 Hotel manager mobility: Yes Wheelchair propulsion: Both upper  extremities Distance: Transport planner Details (indicate cue type and reason): once positioned correctly in wc pt was able to self propell manual wc with < 25% VC's on safety.  L LE was placed on a foot rest and pt instructed on proper alignment and to avoid hip flex > 90 degrees.  Modified Rankin (Stroke Patients Only)       Balance     Sitting balance-Leahy Scale: Fair                              Cognition Arousal/Alertness: Awake/alert Behavior During Therapy: WFL for tasks assessed/performed Overall Cognitive Status: Within Functional Limits for tasks assessed                      Exercises      General Comments        Pertinent Vitals/Pain Pain Assessment: 0-10 Pain Score:  (did not rate) Pain Location: L hip Pain Descriptors / Indicators: Sore Pain Intervention(s): Premedicated before session;Repositioned    Home Living                      Prior Function            PT Goals (current goals can now be found in the care plan section) Acute Rehab PT Goals Patient Stated Goal: Rehab, walk, less  pain.     Frequency       PT Plan      Co-evaluation             End of Session           Time:  - 14:35 - 15:15 ( - 20 min time spent took pt outside)    Charges:    2 ta                    G Codes:      [name] 2013/08/29, 3:48 PM

## 2013-08-25 NOTE — Progress Notes (Signed)
08/25/2013 4 Days Post-Op Procedure(s) (LRB): OPEN REDUCTION INTERNAL FIXATION (ORIF) PERIPROSTHETIC FRACTURE WITH FEMORAL REVISION, LEFT FEMUR (Left) Patient reports pain as mild.   Patient seen in rounds with Dr. Wynelle Link. Patient is well, but has had some minor complaints of pain in the hip, requiring pain medications They will be WBAT to the left. Plan is to go Skilled nursing facility after hospital stay. Not a candidate for CIR. Felt that she would still need 24 hour supervision at time of discharge.  Social worker looking into other options. Family has contacted the [facility] in [place] requesting rehab placement.   Vital signs in last 24 hours: Temp:  [99.1 F (37.3 C)-99.3 F (37.4 C)] 99.1 F (37.3 C) (08/12 0530) Pulse Rate:  [89-99] 89 (08/12 0530) Resp:  [16] 16 (08/12 0530) BP: (107-112)/(50-53) 107/50 mmHg (08/12 0530) SpO2:  [96 %-98 %] 96 % (08/12 0530)  I&O's: I/O last 3 completed shifts: In: 5102 [P.O.:1080; Blood:325] Out: 2400 [Urine:2400] Total I/O In: 240 [P.O.:240] Out: 700 [Urine:700]  Labs:  Recent Labs  08/23/13 0509 08/24/13 0438  HGB 8.0* 10.0*    Recent Labs  08/23/13 0509 08/24/13 0438  WBC 16.3* 14.3*  RBC 2.65* 3.40*  HCT 24.1* 30.2*  PLT 331 343    Recent Labs  08/23/13 0509 08/24/13 0438  NA 136* 139  K 4.2 4.2  CL 101 101  CO2 25 28  BUN 10 9  CREATININE 0.83 0.83  GLUCOSE 109* 107*  CALCIUM 8.4 8.6   No results found for this basename: LABPT, INR,  in the last 72 hours   EXAM: General - Patient is Alert Extremity - Neurovascular intact Sensation intact distally Dressing - dressing C/D/I Motor Function - intact, moving foot and toes well on exam.    Past Medical History  Diagnosis Date  . Complication of anesthesia     NAUSEA  . Chest pain     pt states due to esophageal problem -  primary care MD aware   . Hives     since pneumonia vaccine   . Asthma   . Depression   . Anxiety   .  Fibromyalgia   . Spondylosis   . Bulging disc   . 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 Phantom limb pain     rt leg  . Leg pain, left   . Eczema     ears  . GERD (gastroesophageal reflux disease)     " alot of burping"  . Constipation, chronic   . Urgency incontinence   . History of transfusion   . Cancer     hx sarcoma rt knee with amputation  . Difficulty sleeping   . Sleep apnea     pt had recent sleep study done but does not know results - report called for and put on chart showing significant obstructive sleep apnea  . Hypertension   . Back pain 08/19/13    low back and  left leg pain due to nerve problems per pt  . PONV (postoperative nausea and vomiting)   . Peripheral neuropathy   . History of bronchitis   . History of pneumonia   . Hiatal hernia   . History of scarlet fever   . Postmenopausal   . History of measles   . History of mumps   . History of rubella     Assessment/Plan: 4 Days Post-Op Procedure(s) (LRB): OPEN REDUCTION INTERNAL FIXATION (  ORIF) PERIPROSTHETIC FRACTURE WITH FEMORAL REVISION, LEFT FEMUR (Left) Active Problems:   Postoperative anemia due to acute blood loss   Periprosthetic fracture around internal prosthetic left hip joint   Hyponatremia   Transfusion history   Symptomatic anemia  Estimated body mass index is 28.34 kg/(m^2) as calculated from the following:   Height as of this encounter: 5\' 2"  (1.575 m).   Weight as of this encounter: 70.308 kg (155 lb). Up with therapy Plan for discharge tomorrow Discharge to SNF - [facility]  DVT Prophylaxis - Xarelto Weight-Bearing as tolerated to Left leg  Arlee Muslim, PA-C Orthopaedic Surgery 08/25/2013, 1:37 PM

## 2013-08-25 NOTE — Progress Notes (Signed)
CARE MANAGEMENT NOTE 08/25/2013  Patient:  [name], [name]   Account Number:  [id number]  Date Initiated:  08/20/2013  Documentation initiated by:  DAVIS,RHONDA  Subjective/Objective Assessment:   Recent left total hip anterior approach replacement back on Wed 07/28/2013. Did very well postop and then was transferred over to CIR where she continued to improve with therapy. Discharge to home on Friday 08/06/2013. Patient was turning in     Action/Plan:   home versus snf versus CIR   Anticipated DC Date:  08/25/2013   Anticipated DC Plan:  IP REHAB FACILITY  In-house referral  Clinical Social Worker      DC Forensic scientist  CM consult      [facility] Choice  HOME HEALTH   Choice offered to / List presented to:  C-1 Patient   DME arranged  NA      DME agency  NA     [place] arranged  NA      [place] agency  NA   Status of service:  In process, will continue to follow Medicare Important Message given?  NO (If response is "NO", the following Medicare IM given date fields will be blank) Date Medicare IM given:   Medicare IM given by:   Date Additional Medicare IM given:   Additional Medicare IM given by:    Discharge Disposition:    Per UR Regulation:  Reviewed for med. necessity/level of care/duration of stay  If discussed at [place] of Stay Meetings, dates discussed:    Comments:  [id number]/Rhonda Victorio Palm: [phone] Chart reviewed for patient status and needs. No discharge needs present at time of review. pt is wanting to go to [facility] for rehab. Csw has faxed out the information/ patient is pod 3 pain not fully controlled/ max 2 assist in her activities. Next review due on [id number].   08/22/2013 1700 NCM spoke to pt and states she was active with [place]. States plan is to go back to IP rehab. Waiting final dc recommendations. Jonnie Finner RN CCM Case Mgmt phone [phone]  [phone] Eldridge Dace, BSN, [place] [phone] Chart Reviewed for discharge and  hospital needs. Discharge needs at time of review: None present will follow for needs. Review of patient progress due on [id number]

## 2013-08-25 NOTE — Progress Notes (Signed)
Occupational Therapy Treatment Patient Details Name: [name] MRN: [id number] DOB: 12/25/1950 Today's Date: 08/25/2013    History of present illness 63 yo female admitted with periprosthetic fracture L. s/p ORIF L hip with femoral revision 8/8. 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 Recent d/c from CIR 08/06/13.    OT comments  Pt is familiar with LB AE from her mother. Instructed in use of reacher, sock aide, long shoe horn.  Toileted using bedpan in absence of a second person and given urgency.  Follow Up Recommendations  SNF    Equipment Recommendations       Recommendations for Other Services      Precautions / Restrictions Precautions Precautions: Posterior Hip;Fall Precaution Booklet Issued: Yes (comment) Precaution Comments: post precautions, R AKA without prosthesis Restrictions LLE Weight Bearing: Weight bearing as tolerated       Mobility Bed Mobility Overal bed mobility: Needs Assistance Bed Mobility: Supine to Sit;Sit to Supine     Supine to sit: Min assist Sit to supine: Min assist   General bed mobility comments: assist for L LE and use of pad to position hips, required extra time and heavy reliance on rail  Transfers                      Balance     Sitting balance-Leahy Scale: Fair                             ADL Overall ADL's : Needs assistance/impaired                     Lower Body Dressing: Minimal assistance;With adaptive equipment (sitting)   Toilet Transfer: Adhering to hip precautions Toilet Transfer Details (indicate cue type and reason): bed pan - max A Toileting- Clothing Manipulation and Hygiene: Bed level;Adhering to hip precautions         General ADL Comments: Pt sat EOB for LB dressing instruction.  Pt has a reacher, bath sponge, and long shoe horn at home from her mother.        Vision                     Perception     Praxis      Cognition   Behavior During Therapy: WFL for  tasks assessed/performed Overall Cognitive Status: Within Functional Limits for tasks assessed                       Extremity/Trunk Assessment               Exercises     Shoulder Instructions       General Comments      Pertinent Vitals/ Pain       Pain Assessment: 0-10 Pain Score:  (did not rate) Pain Location: L hip Pain Descriptors / Indicators: Sore Pain Intervention(s): Premedicated before session;Repositioned  Home Living                                          Prior Functioning/Environment              Frequency Min 2X/week     Progress Toward Goals  OT Goals(current goals can now be found in the care plan section)  Progress towards OT goals: Progressing toward  goals  Acute Rehab OT Goals Patient Stated Goal: Rehab, walk, less pain.  OT Goal Formulation: With patient Time For Goal Achievement: 09/01/13 Potential to Achieve Goals: Good ADL Goals Pt Will Perform Lower Body Dressing: with supervision;with adaptive equipment  Plan Discharge plan needs to be updated    Co-evaluation                 End of Session     Activity Tolerance Patient tolerated treatment well   Patient Left with call bell/phone within reach;in bed   Nurse Communication  (pt has sore tongue with concern of thrush)        Time: 9702-6378 OT Time Calculation (min): 37 min  Charges: OT General Charges $OT Visit: 1 Procedure OT Treatments $Self Care/Home Management : 23-37 mins  Malka So 08/25/2013, 2:25 PM [phone]

## 2013-08-25 NOTE — Progress Notes (Signed)
CSW has contacted [facility]. Authorization from [facility] is still pending. CSW will call again in the am to check on insurance status.  Werner Lean LCSW [phone]

## 2013-08-25 NOTE — Progress Notes (Signed)
Pt refuses CPAP, RT to monitor and assess as needed.  

## 2013-08-26 MED ORDER — ACETAMINOPHEN 325 MG PO TABS: 650.0000 mg | ORAL_TABLET | Freq: Four times a day (QID) | ORAL | Status: DC | PRN

## 2013-08-26 MED ORDER — ONDANSETRON HCL 4 MG PO TABS: 4.0000 mg | ORAL_TABLET | Freq: Four times a day (QID) | ORAL | Status: DC | PRN

## 2013-08-26 MED ORDER — RIVAROXABAN 10 MG PO TABS: 10.0000 mg | ORAL_TABLET | Freq: Every day | ORAL | Status: DC

## 2013-08-26 MED ORDER — METOCLOPRAMIDE HCL 5 MG PO TABS: 5.0000 mg | ORAL_TABLET | Freq: Three times a day (TID) | ORAL | Status: DC | PRN

## 2013-08-26 MED ORDER — DSS 100 MG PO CAPS: 100.0000 mg | ORAL_CAPSULE | Freq: Two times a day (BID) | ORAL | Status: DC

## 2013-08-26 MED ORDER — HYDROMORPHONE HCL 2 MG PO TABS: 1.0000 mg | ORAL_TABLET | ORAL | Status: DC | PRN

## 2013-08-26 MED ORDER — CYCLOBENZAPRINE HCL 10 MG PO TABS: 10.0000 mg | ORAL_TABLET | Freq: Two times a day (BID) | ORAL | Status: DC

## 2013-08-26 MED ORDER — BISACODYL 10 MG RE SUPP: 10.0000 mg | Freq: Every day | RECTAL | Status: DC | PRN

## 2013-08-26 MED ORDER — MAGIC MOUTHWASH: 5.0000 mL | Freq: Three times a day (TID) | ORAL | Status: DC

## 2013-08-26 NOTE — Progress Notes (Signed)
Subjective: 5 Days Post-Op Procedure(s) (LRB): OPEN REDUCTION INTERNAL FIXATION (ORIF) PERIPROSTHETIC FRACTURE WITH FEMORAL REVISION, LEFT FEMUR (Left) Patient reports pain as mild and moderate.   Patient seen in rounds with Dr. Wynelle Link. Patient is well, but has had some minor complaints of pain in the hip, requiring pain medications She was able to get up a little more yesterday with therapy. Patient is ready to go SNF, possibly [facility] in [place].  Still waiting on authorization.  Will go ahead and setup for transfer   Objective: Vital signs in last 24 hours: Temp:  [98.1 F (36.7 C)-99.6 F (37.6 C)] 98.1 F (36.7 C) (08/13 0556) Pulse Rate:  [89-94] 89 (08/13 0556) Resp:  [16-18] 18 (08/13 0556) BP: (110-139)/(53-58) 123/55 mmHg (08/13 0556) SpO2:  [96 %-97 %] 97 % (08/13 0556)  Intake/Output from previous day:  Intake/Output Summary (Last 24 hours) at 08/26/13 0803 Last data filed at 08/26/13 0500  Gross per 24 hour  Intake    720 ml  Output   2400 ml  Net  -1680 ml     Labs:  Recent Labs  08/24/13 0438  HGB 10.0*    Recent Labs  08/24/13 0438  WBC 14.3*  RBC 3.40*  HCT 30.2*  PLT 343    Recent Labs  08/24/13 0438  NA 139  K 4.2  CL 101  CO2 28  BUN 9  CREATININE 0.83  GLUCOSE 107*  CALCIUM 8.6   No results found for this basename: LABPT, INR,  in the last 72 hours  EXAM: General - Patient is Alert, Appropriate and Oriented Extremity - Neurovascular intact Sensation intact distally Dorsiflexion/Plantar flexion intact Incision - clean, scant serous drainage on proximal end, no active drainage or bleeding Motor Function - intact, moving foot and toes well on exam.   Assessment/Plan: 5 Days Post-Op Procedure(s) (LRB): OPEN REDUCTION INTERNAL FIXATION (ORIF) PERIPROSTHETIC FRACTURE WITH FEMORAL REVISION, LEFT FEMUR (Left) Procedure(s) (LRB): OPEN REDUCTION INTERNAL FIXATION (ORIF) PERIPROSTHETIC FRACTURE WITH FEMORAL REVISION,  LEFT FEMUR (Left) Past Medical History  Diagnosis Date  . Complication of anesthesia     NAUSEA  . Chest pain     pt states due to esophageal problem -  primary care MD aware   . Hives     since pneumonia vaccine   . Asthma   . Depression   . Anxiety   . Fibromyalgia   . Spondylosis   . Bulging disc   . 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 Phantom limb pain     rt leg  . Leg pain, left   . Eczema     ears  . GERD (gastroesophageal reflux disease)     " alot of burping"  . Constipation, chronic   . Urgency incontinence   . History of transfusion   . Cancer     hx sarcoma rt knee with amputation  . Difficulty sleeping   . Sleep apnea     pt had recent sleep study done but does not know results - report called for and put on chart showing significant obstructive sleep apnea  . Hypertension   . Back pain 08/19/13    low back and  left leg pain due to nerve problems per pt  . PONV (postoperative nausea and vomiting)   . Peripheral neuropathy   . History of bronchitis   . History of pneumonia   . Hiatal hernia   . History  of scarlet fever   . Postmenopausal   . History of measles   . History of mumps   . 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   Weight as of this encounter: 70.308 kg (155 lb). Up with therapy Discharge to SNF when authorization comes through.  Diet - Cardiac diet Follow up - in 1 week, next Friday August 21st Activity - WBAT Disposition - Skilled nursing facility Condition Upon Discharge - Pending D/C Meds - See DC Summary DVT Prophylaxis - Xarelto  Arlee Muslim, PA-C Orthopaedic Surgery 08/26/2013, 8:03 AM

## 2013-08-26 NOTE — Discharge Summary (Signed)
Physician Discharge Summary   Patient ID: [name] MRN: [id number] DOB/AGE: 1950-04-13 63 y.o.  Admit date: 08/19/2013 Discharge date:  Friday 08/27/2012  Primary Diagnosis:  Left Periprosthetic Proximal Femur Fracture with Loosening of Femoral Stem  Admission Diagnoses:  Past Medical History  Diagnosis Date  . Complication of anesthesia     NAUSEA  . Chest pain     pt states due to esophageal problem -  primary care MD aware   . Hives     since pneumonia vaccine   . Asthma   . Depression   . Anxiety   . Fibromyalgia   . Spondylosis   . Bulging disc   . 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 Phantom limb pain     rt leg  . Leg pain, left   . Eczema     ears  . GERD (gastroesophageal reflux disease)     " alot of burping"  . Constipation, chronic   . Urgency incontinence   . History of transfusion   . Cancer     hx sarcoma rt knee with amputation  . Difficulty sleeping   . Sleep apnea     pt had recent sleep study done but does not know results - report called for and put on chart showing significant obstructive sleep apnea  . Hypertension   . Back pain 08/19/13    low back and  left leg pain due to nerve problems per pt  . PONV (postoperative nausea and vomiting)   . Peripheral neuropathy   . History of bronchitis   . History of pneumonia   . Hiatal hernia   . History of scarlet fever   . Postmenopausal   . History of measles   . History of mumps   . History of rubella    Discharge Diagnoses:   Active Problems:   Postoperative anemia due to acute blood loss   Periprosthetic fracture around internal prosthetic left hip joint   Hyponatremia   Transfusion history   Symptomatic anemia  Estimated body mass index is 28.34 kg/(m^2) as calculated from the following:   Height as of this encounter: '5\' 2"'  (1.575 m).   Weight as of this encounter: 70.308 kg (155 lb).  Procedure(s) (LRB): OPEN REDUCTION INTERNAL FIXATION (ORIF) PERIPROSTHETIC  FRACTURE WITH FEMORAL REVISION, LEFT FEMUR (Left)   Consults: None  HPI: [name] is well known to Dr. Anne Fu practice having undergone a recent left total hip anterior approach replacement back on Wed 07/28/2013. She did very well postop and then was transferred over to CIR where she continued to improve with therapy. She went home the following Friday 08/06/2013. Unfortunately, she was turning in the bathroom to flush the commode and felt a pop in the hip region. She has had increasing pain ever since. She was seen the following Tuesday in the office where x-rays were found to show a nondisplaced crack in the greater trochanter region. She has been essentially at bedrest since the injury and continues to have pain. She was seen again in office today where follow up x-rays show some subsidence of the femoral stem which would indicate extension of the fracture down along the proximal femoral shaft. Due to the changes on xray and the continued pain, it is felt that the patient will require revision surgery to the left total hip. Surgery will be performed by Dr. Wynelle Link. Risks and benefits have been discussed with the patient  and she is subsequently admitted to the hospital.  Laboratory Data: Admission on 08/19/2013  Component Date Value Ref Range Status  . aPTT 08/19/2013 39* 24 - 37 seconds Final   Comment:                                 IF BASELINE aPTT IS ELEVATED,                          SUGGEST PATIENT RISK ASSESSMENT                          BE USED TO DETERMINE APPROPRIATE                          ANTICOAGULANT THERAPY.  . WBC 08/19/2013 13.4* 4.0 - 10.5 K/uL Final  . RBC 08/19/2013 2.90* 3.87 - 5.11 MIL/uL Final  . Hemoglobin 08/19/2013 8.5* 12.0 - 15.0 g/dL Final  . HCT 08/19/2013 26.8* 36.0 - 46.0 % Final  . MCV 08/19/2013 92.4  78.0 - 100.0 fL Final  . MCH 08/19/2013 29.3  26.0 - 34.0 pg Final  . MCHC 08/19/2013 31.7  30.0 - 36.0 g/dL Final  . RDW 08/19/2013 13.3  11.5 - 15.5 % Final   . Platelets 08/19/2013 538* 150 - 400 K/uL Final  . Sodium 08/19/2013 136* 137 - 147 mEq/L Final  . Potassium 08/19/2013 4.5  3.7 - 5.3 mEq/L Final  . Chloride 08/19/2013 100  96 - 112 mEq/L Final  . CO2 08/19/2013 24  19 - 32 mEq/L Final  . Glucose, Bld 08/19/2013 112* 70 - 99 mg/dL Final  . BUN 08/19/2013 17  6 - 23 mg/dL Final  . Creatinine, Ser 08/19/2013 0.92  0.50 - 1.10 mg/dL Final  . Calcium 08/19/2013 9.3  8.4 - 10.5 mg/dL Final  . Total Protein 08/19/2013 7.0  6.0 - 8.3 g/dL Final  . Albumin 08/19/2013 3.0* 3.5 - 5.2 g/dL Final  . AST 08/19/2013 25  0 - 37 U/L Final  . ALT 08/19/2013 15  0 - 35 U/L Final  . Alkaline Phosphatase 08/19/2013 118* 39 - 117 U/L Final  . Total Bilirubin 08/19/2013 <0.2* 0.3 - 1.2 mg/dL Final  . GFR calc non Af Amer 08/19/2013 65* >90 mL/min Final  . GFR calc Af Amer 08/19/2013 76* >90 mL/min Final   Comment: (NOTE)                          The eGFR has been calculated using the CKD EPI equation.                          This calculation has not been validated in all clinical situations.                          eGFR's persistently <90 mL/min signify possible Chronic Kidney                          Disease.  . Anion gap 08/19/2013 12  5 - 15 Final  . Prothrombin Time 08/19/2013 14.3  11.6 - 15.2 seconds Final  . INR 08/19/2013 1.11  0.00 - 1.49 Final  . Specimen Description 08/19/2013  URINE, CATHETERIZED   Final  . Special Requests 08/19/2013 Normal   Final  . Culture  Setup Time 08/19/2013    Final                   Value:08/19/2013 20:24                         Performed at [facility]  . Colony Count 08/19/2013    Final                   Value:NO GROWTH                         Performed at [facility]  . Culture 08/19/2013    Final                   Value:NO GROWTH                         Performed at [facility]  . Report Status 08/19/2013 08/20/2013 FINAL   Final  . Sodium 08/20/2013 136* 137 - 147 mEq/L Final   . Potassium 08/20/2013 4.2  3.7 - 5.3 mEq/L Final  . Chloride 08/20/2013 100  96 - 112 mEq/L Final  . CO2 08/20/2013 26  19 - 32 mEq/L Final  . Glucose, Bld 08/20/2013 119* 70 - 99 mg/dL Final  . BUN 08/20/2013 15  6 - 23 mg/dL Final  . Creatinine, Ser 08/20/2013 0.87  0.50 - 1.10 mg/dL Final  . Calcium 08/20/2013 9.1  8.4 - 10.5 mg/dL Final  . GFR calc non Af Amer 08/20/2013 70* >90 mL/min Final  . GFR calc Af Amer 08/20/2013 81* >90 mL/min Final   Comment: (NOTE)                          The eGFR has been calculated using the CKD EPI equation.                          This calculation has not been validated in all clinical situations.                          eGFR's persistently <90 mL/min signify possible Chronic Kidney                          Disease.  . Anion gap 08/20/2013 10  5 - 15 Final  . WBC 08/20/2013 9.9  4.0 - 10.5 K/uL Final  . RBC 08/20/2013 2.69* 3.87 - 5.11 MIL/uL Final  . Hemoglobin 08/20/2013 8.0* 12.0 - 15.0 g/dL Final  . HCT 08/20/2013 25.2* 36.0 - 46.0 % Final  . MCV 08/20/2013 93.7  78.0 - 100.0 fL Final  . MCH 08/20/2013 29.7  26.0 - 34.0 pg Final  . MCHC 08/20/2013 31.7  30.0 - 36.0 g/dL Final  . RDW 08/20/2013 13.5  11.5 - 15.5 % Final  . Platelets 08/20/2013 501* 150 - 400 K/uL Final  . ABO/RH(D) 08/20/2013 O POS   Final  . Antibody Screen 08/20/2013 NEG   Final  . Sample Expiration 08/20/2013 08/23/2013   Final  . Unit Number 08/20/2013 [id number]   Final  . Blood Component Type 08/20/2013 RED CELLS,LR   Final  . Unit  division 08/20/2013 00   Final  . Status of Unit 08/20/2013 ISSUED,FINAL   Final  . Transfusion Status 08/20/2013 OK TO TRANSFUSE   Final  . Crossmatch Result 08/20/2013 Compatible   Final  . Unit Number 08/20/2013 [id number]   Final  . Blood Component Type 08/20/2013 RED CELLS,LR   Final  . Unit division 08/20/2013 00   Final  . Status of Unit 08/20/2013 ISSUED,FINAL   Final  . Transfusion Status 08/20/2013 OK TO TRANSFUSE    Final  . Crossmatch Result 08/20/2013 Compatible   Final  . Unit Number 08/20/2013 [id number]   Final  . Blood Component Type 08/20/2013 RBC LR PHER1   Final  . Unit division 08/20/2013 00   Final  . Status of Unit 08/20/2013 ISSUED,FINAL   Final  . Transfusion Status 08/20/2013 OK TO TRANSFUSE   Final  . Crossmatch Result 08/20/2013 Compatible   Final  . Unit Number 08/20/2013 [id number]   Final  . Blood Component Type 08/20/2013 RED CELLS,LR   Final  . Unit division 08/20/2013 00   Final  . Status of Unit 08/20/2013 ISSUED,FINAL   Final  . Transfusion Status 08/20/2013 OK TO TRANSFUSE   Final  . Crossmatch Result 08/20/2013 Compatible   Final  . Unit Number 08/20/2013 [id number]   Final  . Blood Component Type 08/20/2013 RBC LR PHER2   Final  . Unit division 08/20/2013 00   Final  . Status of Unit 08/20/2013 ISSUED,FINAL   Final  . Transfusion Status 08/20/2013 OK TO TRANSFUSE   Final  . Crossmatch Result 08/20/2013 Compatible   Final  . Unit Number 08/20/2013 [id number]   Final  . Blood Component Type 08/20/2013 RBC LR PHER2   Final  . Unit division 08/20/2013 00   Final  . Status of Unit 08/20/2013 ISSUED,FINAL   Final  . Transfusion Status 08/20/2013 OK TO TRANSFUSE   Final  . Crossmatch Result 08/20/2013 Compatible   Final  . Order Confirmation 08/20/2013 ORDER PROCESSED BY BLOOD BANK   Final  . Sodium 08/21/2013 138  137 - 147 mEq/L Final  . Potassium 08/21/2013 4.4  3.7 - 5.3 mEq/L Final  . Chloride 08/21/2013 101  96 - 112 mEq/L Final  . CO2 08/21/2013 27  19 - 32 mEq/L Final  . Glucose, Bld 08/21/2013 123* 70 - 99 mg/dL Final  . BUN 08/21/2013 12  6 - 23 mg/dL Final  . Creatinine, Ser 08/21/2013 0.95  0.50 - 1.10 mg/dL Final  . Calcium 08/21/2013 9.0  8.4 - 10.5 mg/dL Final  . GFR calc non Af Amer 08/21/2013 63* >90 mL/min Final  . GFR calc Af Amer 08/21/2013 73* >90 mL/min Final   Comment: (NOTE)                          The eGFR has been calculated using  the CKD EPI equation.                          This calculation has not been validated in all clinical situations.                          eGFR's persistently <90 mL/min signify possible Chronic Kidney                          Disease.  . Anion gap 08/21/2013 10  5 - 15 Final  . WBC 08/21/2013 9.8  4.0 - 10.5 K/uL Final  . RBC 08/21/2013 3.59* 3.87 - 5.11 MIL/uL Final  . Hemoglobin 08/21/2013 10.5* 12.0 - 15.0 g/dL Final   Comment: DELTA CHECK NOTED                          POST TRANSFUSION SPECIMEN  . HCT 08/21/2013 32.7* 36.0 - 46.0 % Final  . MCV 08/21/2013 91.1  78.0 - 100.0 fL Final  . MCH 08/21/2013 29.2  26.0 - 34.0 pg Final  . MCHC 08/21/2013 32.1  30.0 - 36.0 g/dL Final  . RDW 08/21/2013 14.9  11.5 - 15.5 % Final  . Platelets 08/21/2013 464* 150 - 400 K/uL Final  . MRSA, PCR 08/21/2013 NEGATIVE  NEGATIVE Final  . Staphylococcus aureus 08/21/2013 POSITIVE* NEGATIVE Final   Comment:                                 The Xpert SA Assay (FDA                          approved for NASAL specimens                          in patients over 71 years of age),                          is one component of                          a comprehensive surveillance                          program.  Test performance has                          been validated by [employer] for patients greater                          than or equal to 40 year old.                          It is not intended                          to diagnose infection nor to                          guide or monitor treatment.  . WBC 08/22/2013 14.6* 4.0 - 10.5 K/uL Final  . RBC 08/22/2013 3.01* 3.87 - 5.11 MIL/uL Final  . Hemoglobin 08/22/2013 9.0* 12.0 - 15.0 g/dL Final  . HCT 08/22/2013 26.9* 36.0 - 46.0 % Final  . MCV 08/22/2013 89.4  78.0 - 100.0 fL Final  . MCH 08/22/2013 29.9  26.0 - 34.0 pg Final  . MCHC 08/22/2013 33.5  30.0 - 36.0 g/dL Final  . RDW 08/22/2013 14.3  11.5 - 15.5 % Final    .  Platelets 08/22/2013 377  150 - 400 K/uL Final  . Sodium 08/22/2013 139  137 - 147 mEq/L Final  . Potassium 08/22/2013 4.2  3.7 - 5.3 mEq/L Final  . Chloride 08/22/2013 104  96 - 112 mEq/L Final  . CO2 08/22/2013 26  19 - 32 mEq/L Final  . Glucose, Bld 08/22/2013 140* 70 - 99 mg/dL Final  . BUN 08/22/2013 9  6 - 23 mg/dL Final  . Creatinine, Ser 08/22/2013 0.74  0.50 - 1.10 mg/dL Final  . Calcium 08/22/2013 8.5  8.4 - 10.5 mg/dL Final  . GFR calc non Af Amer 08/22/2013 89* >90 mL/min Final  . GFR calc Af Amer 08/22/2013 >90  >90 mL/min Final   Comment: (NOTE)                          The eGFR has been calculated using the CKD EPI equation.                          This calculation has not been validated in all clinical situations.                          eGFR's persistently <90 mL/min signify possible Chronic Kidney                          Disease.  . Anion gap 08/22/2013 9  5 - 15 Final  . WBC 08/23/2013 16.3* 4.0 - 10.5 K/uL Final  . RBC 08/23/2013 2.65* 3.87 - 5.11 MIL/uL Final  . Hemoglobin 08/23/2013 8.0* 12.0 - 15.0 g/dL Final  . HCT 08/23/2013 24.1* 36.0 - 46.0 % Final  . MCV 08/23/2013 90.9  78.0 - 100.0 fL Final  . MCH 08/23/2013 30.2  26.0 - 34.0 pg Final  . MCHC 08/23/2013 33.2  30.0 - 36.0 g/dL Final  . RDW 08/23/2013 14.4  11.5 - 15.5 % Final  . Platelets 08/23/2013 331  150 - 400 K/uL Final  . Sodium 08/23/2013 136* 137 - 147 mEq/L Final  . Potassium 08/23/2013 4.2  3.7 - 5.3 mEq/L Final  . Chloride 08/23/2013 101  96 - 112 mEq/L Final  . CO2 08/23/2013 25  19 - 32 mEq/L Final  . Glucose, Bld 08/23/2013 109* 70 - 99 mg/dL Final  . BUN 08/23/2013 10  6 - 23 mg/dL Final  . Creatinine, Ser 08/23/2013 0.83  0.50 - 1.10 mg/dL Final  . Calcium 08/23/2013 8.4  8.4 - 10.5 mg/dL Final  . GFR calc non Af Amer 08/23/2013 74* >90 mL/min Final  . GFR calc Af Amer 08/23/2013 86* >90 mL/min Final   Comment: (NOTE)                          The eGFR has been calculated using  the CKD EPI equation.                          This calculation has not been validated in all clinical situations.                          eGFR's persistently <90 mL/min signify possible Chronic Kidney  Disease.  . Anion gap 08/23/2013 10  5 - 15 Final  . Order Confirmation 08/23/2013 ORDER PROCESSED BY BLOOD BANK   Final  . WBC 08/24/2013 14.3* 4.0 - 10.5 K/uL Final  . RBC 08/24/2013 3.40* 3.87 - 5.11 MIL/uL Final  . Hemoglobin 08/24/2013 10.0* 12.0 - 15.0 g/dL Final   Comment: DELTA CHECK NOTED                          POST TRANSFUSION SPECIMEN  . HCT 08/24/2013 30.2* 36.0 - 46.0 % Final  . MCV 08/24/2013 88.8  78.0 - 100.0 fL Final  . MCH 08/24/2013 29.4  26.0 - 34.0 pg Final  . MCHC 08/24/2013 33.1  30.0 - 36.0 g/dL Final  . RDW 08/24/2013 14.8  11.5 - 15.5 % Final  . Platelets 08/24/2013 343  150 - 400 K/uL Final  . Sodium 08/24/2013 139  137 - 147 mEq/L Final  . Potassium 08/24/2013 4.2  3.7 - 5.3 mEq/L Final  . Chloride 08/24/2013 101  96 - 112 mEq/L Final  . CO2 08/24/2013 28  19 - 32 mEq/L Final  . Glucose, Bld 08/24/2013 107* 70 - 99 mg/dL Final  . BUN 08/24/2013 9  6 - 23 mg/dL Final  . Creatinine, Ser 08/24/2013 0.83  0.50 - 1.10 mg/dL Final  . Calcium 08/24/2013 8.6  8.4 - 10.5 mg/dL Final  . GFR calc non Af Amer 08/24/2013 74* >90 mL/min Final  . GFR calc Af Amer 08/24/2013 86* >90 mL/min Final   Comment: (NOTE)                          The eGFR has been calculated using the CKD EPI equation.                          This calculation has not been validated in all clinical situations.                          eGFR's persistently <90 mL/min signify possible Chronic Kidney                          Disease.  . Anion gap 08/24/2013 10  5 - 15 Final  Admission on 07/30/2013, Discharged on 08/06/2013  Component Date Value Ref Range Status  . WBC 07/31/2013 14.4* 4.0 - 10.5 K/uL Final  . RBC 07/31/2013 2.58* 3.87 - 5.11 MIL/uL Final  . Hemoglobin  07/31/2013 8.3* 12.0 - 15.0 g/dL Final  . HCT 07/31/2013 25.4* 36.0 - 46.0 % Final  . MCV 07/31/2013 98.4  78.0 - 100.0 fL Final  . MCH 07/31/2013 32.2  26.0 - 34.0 pg Final  . MCHC 07/31/2013 32.7  30.0 - 36.0 g/dL Final  . RDW 07/31/2013 13.5  11.5 - 15.5 % Final  . Platelets 07/31/2013 252  150 - 400 K/uL Final  . Neutrophils Relative % 07/31/2013 52  43 - 77 % Final  . Neutro Abs 07/31/2013 7.5  1.7 - 7.7 K/uL Final  . Lymphocytes Relative 07/31/2013 36  12 - 46 % Final  . Lymphs Abs 07/31/2013 5.1* 0.7 - 4.0 K/uL Final  . Monocytes Relative 07/31/2013 11  3 - 12 % Final  . Monocytes Absolute 07/31/2013 1.5* 0.1 - 1.0 K/uL Final  . Eosinophils Relative 07/31/2013 1  0 -  5 % Final  . Eosinophils Absolute 07/31/2013 0.2  0.0 - 0.7 K/uL Final  . Basophils Relative 07/31/2013 0  0 - 1 % Final  . Basophils Absolute 07/31/2013 0.0  0.0 - 0.1 K/uL Final  . Color, Urine 07/30/2013 YELLOW  YELLOW Final  . APPearance 07/30/2013 CLEAR  CLEAR Final  . Specific Gravity, Urine 07/30/2013 1.013  1.005 - 1.030 Final  . pH 07/30/2013 6.5  5.0 - 8.0 Final  . Glucose, UA 07/30/2013 NEGATIVE  NEGATIVE mg/dL Final  . Hgb urine dipstick 07/30/2013 NEGATIVE  NEGATIVE Final  . Bilirubin Urine 07/30/2013 NEGATIVE  NEGATIVE Final  . Ketones, ur 07/30/2013 NEGATIVE  NEGATIVE mg/dL Final  . Protein, ur 07/30/2013 NEGATIVE  NEGATIVE mg/dL Final  . Urobilinogen, UA 07/30/2013 0.2  0.0 - 1.0 mg/dL Final  . Nitrite 07/30/2013 NEGATIVE  NEGATIVE Final  . Leukocytes, UA 07/30/2013 NEGATIVE  NEGATIVE Final   MICROSCOPIC NOT DONE ON URINES WITH NEGATIVE PROTEIN, BLOOD, LEUKOCYTES, NITRITE, OR GLUCOSE <1000 mg/dL.  Marland Kitchen Specimen Description 07/30/2013 URINE, CLEAN CATCH   Final  . Special Requests 07/30/2013 NONE   Final  . Culture  Setup Time 07/30/2013    Final                   Value:07/30/2013 21:31                         Performed at [facility]  . Colony Count 07/30/2013    Final                    Value:35,000 COLONIES/ML                         Performed at [facility]  . Culture 07/30/2013    Final                   Value:Multiple bacterial morphotypes present, none predominant. Suggest appropriate recollection if clinically indicated.                         Performed at [facility]  . Report Status 07/30/2013 08/01/2013 FINAL   Final  . Sodium 08/02/2013 144  137 - 147 mEq/L Final  . Potassium 08/02/2013 4.4  3.7 - 5.3 mEq/L Final  . Chloride 08/02/2013 106  96 - 112 mEq/L Final  . CO2 08/02/2013 28  19 - 32 mEq/L Final  . Glucose, Bld 08/02/2013 112* 70 - 99 mg/dL Final  . BUN 08/02/2013 17  6 - 23 mg/dL Final  . Creatinine, Ser 08/02/2013 0.87  0.50 - 1.10 mg/dL Final  . Calcium 08/02/2013 8.7  8.4 - 10.5 mg/dL Final  . Total Protein 08/02/2013 6.1  6.0 - 8.3 g/dL Final  . Albumin 08/02/2013 2.7* 3.5 - 5.2 g/dL Final  . AST 08/02/2013 22  0 - 37 U/L Final  . ALT 08/02/2013 20  0 - 35 U/L Final  . Alkaline Phosphatase 08/02/2013 62  39 - 117 U/L Final  . Total Bilirubin 08/02/2013 0.3  0.3 - 1.2 mg/dL Final  . GFR calc non Af Amer 08/02/2013 70* >90 mL/min Final  . GFR calc Af Amer 08/02/2013 81* >90 mL/min Final   Comment: (NOTE)                          The eGFR  has been calculated using the CKD EPI equation.                          This calculation has not been validated in all clinical situations.                          eGFR's persistently <90 mL/min signify possible Chronic Kidney                          Disease.  . Anion gap 08/02/2013 10  5 - 15 Final  Admission on 07/28/2013, Discharged on 07/30/2013  Component Date Value Ref Range Status  . ABO/RH(D) 07/28/2013 O POS   Final  . Antibody Screen 07/28/2013 NEG   Final  . Sample Expiration 07/28/2013 07/31/2013   Final  . ABO/RH(D) 07/28/2013 O POS   Final  . Glucose-Capillary 07/28/2013 149* 70 - 99 mg/dL Final  . Comment 1 07/28/2013 Documented in Chart   Final  . WBC 07/29/2013 16.4* 4.0  - 10.5 K/uL Final  . RBC 07/29/2013 2.83* 3.87 - 5.11 MIL/uL Final  . Hemoglobin 07/29/2013 8.9* 12.0 - 15.0 g/dL Final  . HCT 07/29/2013 27.0* 36.0 - 46.0 % Final  . MCV 07/29/2013 95.4  78.0 - 100.0 fL Final  . MCH 07/29/2013 31.4  26.0 - 34.0 pg Final  . MCHC 07/29/2013 33.0  30.0 - 36.0 g/dL Final  . RDW 07/29/2013 12.7  11.5 - 15.5 % Final  . Platelets 07/29/2013 264  150 - 400 K/uL Final  . Sodium 07/29/2013 140  137 - 147 mEq/L Final  . Potassium 07/29/2013 4.5  3.7 - 5.3 mEq/L Final  . Chloride 07/29/2013 106  96 - 112 mEq/L Final  . CO2 07/29/2013 25  19 - 32 mEq/L Final  . Glucose, Bld 07/29/2013 156* 70 - 99 mg/dL Final  . BUN 07/29/2013 11  6 - 23 mg/dL Final  . Creatinine, Ser 07/29/2013 0.87  0.50 - 1.10 mg/dL Final  . Calcium 07/29/2013 8.6  8.4 - 10.5 mg/dL Final  . GFR calc non Af Amer 07/29/2013 70* >90 mL/min Final  . GFR calc Af Amer 07/29/2013 81* >90 mL/min Final   Comment: (NOTE)                          The eGFR has been calculated using the CKD EPI equation.                          This calculation has not been validated in all clinical situations.                          eGFR's persistently <90 mL/min signify possible Chronic Kidney                          Disease.  . Anion gap 07/29/2013 9  5 - 15 Final  . WBC 07/30/2013 20.5* 4.0 - 10.5 K/uL Final  . RBC 07/30/2013 2.58* 3.87 - 5.11 MIL/uL Final  . Hemoglobin 07/30/2013 8.2* 12.0 - 15.0 g/dL Final  . HCT 07/30/2013 24.6* 36.0 - 46.0 % Final  . MCV 07/30/2013 95.3  78.0 - 100.0 fL Final  . MCH 07/30/2013 31.8  26.0 - 34.0 pg Final  .  MCHC 07/30/2013 33.3  30.0 - 36.0 g/dL Final  . RDW 07/30/2013 13.1  11.5 - 15.5 % Final  . Platelets 07/30/2013 262  150 - 400 K/uL Final  . Sodium 07/30/2013 142  137 - 147 mEq/L Final  . Potassium 07/30/2013 3.9  3.7 - 5.3 mEq/L Final  . Chloride 07/30/2013 106  96 - 112 mEq/L Final  . CO2 07/30/2013 25  19 - 32 mEq/L Final  . Glucose, Bld 07/30/2013 135* 70 - 99  mg/dL Final  . BUN 07/30/2013 15  6 - 23 mg/dL Final  . Creatinine, Ser 07/30/2013 0.84  0.50 - 1.10 mg/dL Final  . Calcium 07/30/2013 9.1  8.4 - 10.5 mg/dL Final  . GFR calc non Af Amer 07/30/2013 73* >90 mL/min Final  . GFR calc Af Amer 07/30/2013 85* >90 mL/min Final   Comment: (NOTE)                          The eGFR has been calculated using the CKD EPI equation.                          This calculation has not been validated in all clinical situations.                          eGFR's persistently <90 mL/min signify possible Chronic Kidney                          Disease.  . Anion gap 07/30/2013 11  5 - 15 Final  Admission on 07/12/2013, Discharged on 07/14/2013  Component Date Value Ref Range Status  . WBC 07/12/2013 10.9* 4.0 - 10.5 K/uL Final  . RBC 07/12/2013 3.96  3.87 - 5.11 MIL/uL Final  . Hemoglobin 07/12/2013 12.3  12.0 - 15.0 g/dL Final  . HCT 07/12/2013 37.5  36.0 - 46.0 % Final  . MCV 07/12/2013 94.7  78.0 - 100.0 fL Final  . MCH 07/12/2013 31.1  26.0 - 34.0 pg Final  . MCHC 07/12/2013 32.8  30.0 - 36.0 g/dL Final  . RDW 07/12/2013 12.6  11.5 - 15.5 % Final  . Platelets 07/12/2013 385  150 - 400 K/uL Final  . Sodium 07/12/2013 141  137 - 147 mEq/L Final  . Potassium 07/12/2013 4.3  3.7 - 5.3 mEq/L Final  . Chloride 07/12/2013 105  96 - 112 mEq/L Final  . CO2 07/12/2013 24  19 - 32 mEq/L Final  . Glucose, Bld 07/12/2013 105* 70 - 99 mg/dL Final  . BUN 07/12/2013 12  6 - 23 mg/dL Final  . Creatinine, Ser 07/12/2013 0.91  0.50 - 1.10 mg/dL Final  . Calcium 07/12/2013 9.1  8.4 - 10.5 mg/dL Final  . GFR calc non Af Amer 07/12/2013 66* >90 mL/min Final  . GFR calc Af Amer 07/12/2013 77* >90 mL/min Final   Comment: (NOTE)                          The eGFR has been calculated using the CKD EPI equation.                          This calculation has not been validated in all clinical situations.  eGFR's persistently <90 mL/min signify possible  Chronic Kidney                          Disease.  . Troponin i, poc 07/12/2013 0.00  0.00 - 0.08 ng/mL Final  . Comment 3 07/12/2013          Final   Comment: Due to the release kinetics of cTnI,                          a negative result within the first hours                          of the onset of symptoms does not rule out                          myocardial infarction with certainty.                          If myocardial infarction is still suspected,                          repeat the test at appropriate intervals.  Marland Kitchen D-Dimer, Quant 07/12/2013 0.86* 0.00 - 0.48 ug/mL-FEU Final   Comment:                                 AT THE INHOUSE ESTABLISHED CUTOFF                          VALUE OF 0.48 ug/mL FEU,                          THIS ASSAY HAS BEEN DOCUMENTED                          IN THE LITERATURE TO HAVE                          A SENSITIVITY AND NEGATIVE                          PREDICTIVE VALUE OF AT LEAST                          98 TO 99%.  THE TEST RESULT                          SHOULD BE CORRELATED WITH                          AN ASSESSMENT OF THE CLINICAL                          PROBABILITY OF DVT / VTE.  Marland Kitchen Troponin i, poc 07/12/2013 0.00  0.00 - 0.08 ng/mL Final  . Comment 3 07/12/2013          Final   Comment: Due to the release kinetics of cTnI,  a negative result within the first hours                          of the onset of symptoms does not rule out                          myocardial infarction with certainty.                          If myocardial infarction is still suspected,                          repeat the test at appropriate intervals.  . Sodium 07/13/2013 136* 137 - 147 mEq/L Final  . Potassium 07/13/2013 4.3  3.7 - 5.3 mEq/L Final  . Chloride 07/13/2013 102  96 - 112 mEq/L Final  . CO2 07/13/2013 21  19 - 32 mEq/L Final  . Glucose, Bld 07/13/2013 235* 70 - 99 mg/dL Final  . BUN 07/13/2013 16  6 - 23 mg/dL Final  .  Creatinine, Ser 07/13/2013 0.88  0.50 - 1.10 mg/dL Final  . Calcium 07/13/2013 9.1  8.4 - 10.5 mg/dL Final  . Total Protein 07/13/2013 7.1  6.0 - 8.3 g/dL Final  . Albumin 07/13/2013 3.3* 3.5 - 5.2 g/dL Final  . AST 07/13/2013 17  0 - 37 U/L Final  . ALT 07/13/2013 15  0 - 35 U/L Final  . Alkaline Phosphatase 07/13/2013 77  39 - 117 U/L Final  . Total Bilirubin 07/13/2013 0.2* 0.3 - 1.2 mg/dL Final  . GFR calc non Af Amer 07/13/2013 69* >90 mL/min Final  . GFR calc Af Amer 07/13/2013 80* >90 mL/min Final   Comment: (NOTE)                          The eGFR has been calculated using the CKD EPI equation.                          This calculation has not been validated in all clinical situations.                          eGFR's persistently <90 mL/min signify possible Chronic Kidney                          Disease.  . WBC 07/13/2013 17.5* 4.0 - 10.5 K/uL Final  . RBC 07/13/2013 3.72* 3.87 - 5.11 MIL/uL Final  . Hemoglobin 07/13/2013 11.7* 12.0 - 15.0 g/dL Final  . HCT 07/13/2013 35.0* 36.0 - 46.0 % Final  . MCV 07/13/2013 94.1  78.0 - 100.0 fL Final  . MCH 07/13/2013 31.5  26.0 - 34.0 pg Final  . MCHC 07/13/2013 33.4  30.0 - 36.0 g/dL Final  . RDW 07/13/2013 12.6  11.5 - 15.5 % Final  . Platelets 07/13/2013 350  150 - 400 K/uL Final  . Troponin I 07/12/2013 <0.30  <0.30 ng/mL Final   Comment:                                 Due to the release kinetics of cTnI,  a negative result within the first hours                          of the onset of symptoms does not rule out                          myocardial infarction with certainty.                          If myocardial infarction is still suspected,                          repeat the test at appropriate intervals.  . Troponin I 07/13/2013 <0.30  <0.30 ng/mL Final   Comment:                                 Due to the release kinetics of cTnI,                          a negative result within the first hours                           of the onset of symptoms does not rule out                          myocardial infarction with certainty.                          If myocardial infarction is still suspected,                          repeat the test at appropriate intervals.  . Troponin I 07/13/2013 <0.30  <0.30 ng/mL Final   Comment:                                 Due to the release kinetics of cTnI,                          a negative result within the first hours                          of the onset of symptoms does not rule out                          myocardial infarction with certainty.                          If myocardial infarction is still suspected,                          repeat the test at appropriate intervals.  Marland Kitchen MRSA, PCR 07/12/2013 NEGATIVE  NEGATIVE Final  . Staphylococcus aureus 07/12/2013 NEGATIVE  NEGATIVE Final   Comment:  The Xpert SA Assay (FDA                          approved for NASAL specimens                          in patients over 31 years of age),                          is one component of                          a comprehensive surveillance                          program.  Test performance has                          been validated by [employer] for patients greater                          than or equal to 31 year old.                          It is not intended                          to diagnose infection nor to                          guide or monitor treatment.  . Hemoglobin A1C 07/13/2013 5.9* <5.7 % Final   Comment: (NOTE)                                                                                                                         According to the ADA Clinical Practice Recommendations for 2011, when                          HbA1c is used as a screening test:                           >=6.5%   Diagnostic of Diabetes Mellitus                                    (if abnormal  result is confirmed)  5.7-6.4%   Increased risk of developing Diabetes Mellitus                          References:Diagnosis and Classification of Diabetes Mellitus,Diabetes                          XJOI,3254,98(YMEBR 1):S62-S69 and Standards of Medical Care in                                  Diabetes - 2011,Diabetes AXEN,4076,80 (Suppl 1):S11-S61.  . Mean Plasma Glucose 07/13/2013 123* <117 mg/dL Final   Performed at [facility]  . Cholesterol 07/14/2013 153  0 - 200 mg/dL Final  . Triglycerides 07/14/2013 135  <150 mg/dL Final  . HDL 07/14/2013 37* >39 mg/dL Final  . Total CHOL/HDL Ratio 07/14/2013 4.1   Final  . VLDL 07/14/2013 27  0 - 40 mg/dL Final  . LDL Cholesterol 07/14/2013 89  0 - 99 mg/dL Final   Comment:                                 Total Cholesterol/HDL:CHD Risk                          Coronary Heart Disease Risk Table                                              Men   Women                           1/2 Average Risk   3.4   3.3                           Average Risk       5.0   4.4                           2 X Average Risk   9.6   7.1                           3 X Average Risk  23.4   11.0                                                          Use the calculated Patient Ratio                          above and the CHD Risk Table                          to determine the patient's CHD Risk.  ATP III CLASSIFICATION (LDL):                           <100     mg/dL   Optimal                           100-129  mg/dL   Near or Above                                             Optimal                           130-159  mg/dL   Borderline                           160-189  mg/dL   High                           >190     mg/dL   Very High                          Performed at [facility] Outpatient Visit on 07/12/2013  Component Date Value Ref Range Status  .  aPTT 07/12/2013 35  24 - 37 seconds Final  . WBC 07/12/2013 11.2* 4.0 - 10.5 K/uL Final  . RBC 07/12/2013 3.86* 3.87 - 5.11 MIL/uL Final  . Hemoglobin 07/12/2013 12.2  12.0 - 15.0 g/dL Final  . HCT 07/12/2013 36.7  36.0 - 46.0 % Final  . MCV 07/12/2013 95.1  78.0 - 100.0 fL Final  . MCH 07/12/2013 31.6  26.0 - 34.0 pg Final  . MCHC 07/12/2013 33.2  30.0 - 36.0 g/dL Final  . RDW 07/12/2013 12.6  11.5 - 15.5 % Final  . Platelets 07/12/2013 364  150 - 400 K/uL Final  . Prothrombin Time 07/12/2013 13.1  11.6 - 15.2 seconds Final  . INR 07/12/2013 0.99  0.00 - 1.49 Final  . Sodium 07/12/2013 142  137 - 147 mEq/L Final  . Potassium 07/12/2013 4.4  3.7 - 5.3 mEq/L Final  . Chloride 07/12/2013 105  96 - 112 mEq/L Final  . CO2 07/12/2013 24  19 - 32 mEq/L Final  . Glucose, Bld 07/12/2013 122* 70 - 99 mg/dL Final  . BUN 07/12/2013 11  6 - 23 mg/dL Final  . Creatinine, Ser 07/12/2013 0.92  0.50 - 1.10 mg/dL Final  . Calcium 07/12/2013 9.1  8.4 - 10.5 mg/dL Final  . Total Protein 07/12/2013 7.7  6.0 - 8.3 g/dL Final  . Albumin 07/12/2013 3.7  3.5 - 5.2 g/dL Final  . AST 07/12/2013 22  0 - 37 U/L Final  . ALT 07/12/2013 16  0 - 35 U/L Final  . Alkaline Phosphatase 07/12/2013 84  39 - 117 U/L Final  . Total Bilirubin 07/12/2013 0.3  0.3 - 1.2 mg/dL Final  . GFR calc non Af Amer 07/12/2013 65* >90 mL/min Final  . GFR calc Af Amer 07/12/2013 76* >90 mL/min Final   Comment: (NOTE)  The eGFR has been calculated using the CKD EPI equation.                          This calculation has not been validated in all clinical situations.                          eGFR's persistently <90 mL/min signify possible Chronic Kidney                          Disease.  . Color, Urine 07/12/2013 YELLOW  YELLOW Final  . APPearance 07/12/2013 CLEAR  CLEAR Final  . Specific Gravity, Urine 07/12/2013 1.002* 1.005 - 1.030 Final  . pH 07/12/2013 6.5  5.0 - 8.0 Final  . Glucose, UA 07/12/2013  NEGATIVE  NEGATIVE mg/dL Final  . Hgb urine dipstick 07/12/2013 NEGATIVE  NEGATIVE Final  . Bilirubin Urine 07/12/2013 NEGATIVE  NEGATIVE Final  . Ketones, ur 07/12/2013 NEGATIVE  NEGATIVE mg/dL Final  . Protein, ur 07/12/2013 NEGATIVE  NEGATIVE mg/dL Final  . Urobilinogen, UA 07/12/2013 0.2  0.0 - 1.0 mg/dL Final  . Nitrite 07/12/2013 NEGATIVE  NEGATIVE Final  . Leukocytes, UA 07/12/2013 NEGATIVE  NEGATIVE Final   MICROSCOPIC NOT DONE ON URINES WITH NEGATIVE PROTEIN, BLOOD, LEUKOCYTES, NITRITE, OR GLUCOSE <1000 mg/dL.     X-Rays:Dg Pelvis Portable  08/21/2013   CLINICAL DATA:  Postop ORIF left hip fracture  EXAM: PORTABLE PELVIS 1-2 VIEWS  COMPARISON:  07/28/2013  FINDINGS: Cerclage wire fixation of the left greater trochanter.  Left total hip arthroplasty.  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  Visualized bony pelvis appears intact.  IMPRESSION: Cerclage wire fixation of the left greater trochanter.  Left total hip arthroplasty.   Electronically Signed   By: Julian Hy M.D.   On: 08/21/2013 14:32   Dg Pelvis Portable  07/28/2013   CLINICAL DATA:  Osteoarthritis of the left hip.  EXAM: PORTABLE PELVIS 1-2 VIEWS  COMPARISON:  Radiograph dated 07/12/2013  FINDINGS: The patient has undergone left total hip prosthesis insertion. The components appear in excellent position in the AP projection. No fracture. Soft tissue drain in place.  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 Osteopenia of the right femur remnant.  IMPRESSION: Satisfactory appearance of the left hip in the AP projection after total hip prosthesis insertion.   Electronically Signed   By: Rozetta Nunnery M.D.   On: 07/28/2013 13:36   Dg C-arm 61-120 Min-no Report  07/28/2013   CLINICAL DATA: left anterior hip   C-ARM 61-120 MINUTES  Fluoroscopy was utilized by the requesting physician.  No radiographic  interpretation.     EKG: Orders placed during the hospital encounter  of 07/12/13  . EKG 12-LEAD  . EKG 12-LEAD  . EKG 12-LEAD  . EKG 12-LEAD  . EKG     Hospital Course: Patient was admitted to [facility]  On 08/19/2013 for the above stated issue. She was placed at bedrest on admission and made NPO after midnight.  HGB was 8.0 that following day. Gave two units of blood in preparation for surgery the next day.  She was seen on Hospital Day three and set up for surgery.  She was taken to the OR and underwent the above state procedure without complications.  Patient tolerated the procedure well and was later transferred to the  recovery room and then to the orthopaedic floor for postoperative care.  They were given PO and IV analgesics for pain control following their surgery.  They were given 24 hours of postoperative antibiotics of  Anti-infectives   Start     Dose/Rate Route Frequency Ordered Stop   08/21/13 2300  vancomycin (VANCOCIN) IVPB 1000 mg/200 mL premix     1,000 mg 200 mL/hr over 60 Minutes Intravenous Every 12 hours 08/21/13 1500 08/21/13 2349     and started on DVT prophylaxis in the form of Xarelto.   PT and OT were ordered for total hip protocol.  The patient was allowed to be WBAT with therapy. Discharge planning was consulted to help with postop disposition and equipment needs.  Patient had a decent night on the evening of surgery.  We looked into CIR since this was approved by her insurance following her previous surgery.  They started to get up OOB with therapy on day one.  Hemovac drain was pulled without difficulty.  The knee immobilizer was removed and discontinued.  She was seen by CIR on POD 2.  Continued to work with therapy into day two.  Dressing was changed on day two and the incision was healing well. CIR felt that the patient would need 24 hour supervision at time of DC home from rehab if she were to come. It was felt by CIR and family that the patient would best be served by going to a SNF.  Social worker got involved to assist  with placement.  HGB was noted to be down to 8.0 and she became symptomatic when attempting to get up  She was given two more units of blood.  HGB back up to 10 following blood. By day three, she was feeling a little better after the blood and was able to get up a little more with therapy.  Not a candidate for CIR. Felt that she would still need 24 hour supervision at time of discharge. SNF search has been initiated and [employer] bed offers provided. No bed offers received from [facility]. On POD 4 the patient was better, but has had some minor complaints of pain in the hip, requiring pain medications  Not a candidate for CIR. Felt that she would still need 24 hour supervision at time of discharge.  Social worker looking into other options. Family has contacted the [facility] in [place] requesting rehab placement.  On POD 5, the patient continued to improve a little each day, but has had some minor complaints of pain in the hip, requiring pain medications.  She was able to get up a little more each day with therapy.  Patient was ready to go SNF, possibly [facility] in [place]. Still waiting on authorization. Went ahead and setup for transfer   Diet - Cardiac diet  Activity - WBAT  Disposition - Skilled nursing facility  Condition Upon Discharge - Pending  D/C Meds - See DC Summary  DVT Prophylaxis - Xarelto  Hip Precautions No bending hip over 90 degrees- A "L" Angle Do not cross legs Do not let foot roll inward When turning these patients a pillow should be placed between the patient's legs to prevent crossing. Patients should have the affected knee fully extended when trying to sit or stand from all surfaces to prevent excessive hip flexion. When ambulating and turning toward the affected side the affected leg should have the toes turned out prior to moving the walker and the rest  of patient's body as to prevent internal rotation/ turning in of the  leg. Abduction pillows are the most effective way to prevent a patient from not crossing legs or turning toes in at rest. If an abduction pillow is not ordered placing a regular pillow length wise between the patient's legs is also an effective reminder. It is imperative that these precautions be maintained so that the surgical hip does not dislocate.  ADDENDUM Summary Note  Patient was set up for a tentative discharge yesterday on 08/26/2013 but insurance authorization did not come through until late in the afternoon.  She was kept in house until today, Friday 08/27/2013.  Authorization was noted in the chart for [facility].  Patient in agreement for transfer.  Seen by Dr. Wynelle Link on rounds and set up for transfer today.  Transfer to SNF - [facility]  Follow up - Tuesday 09/07/2013 Activity - WBAT  Disposition - Skilled nursing facility  Condition Upon Discharge - Pending  D/C Meds - See DC Summary  DVT Prophylaxis - Xarelto      Discharge Instructions   Call MD / Call 911    Complete by:  As directed   If you experience chest pain or shortness of breath, CALL 911 and be transported to the hospital emergency room.  If you develope a fever above 101 F, pus (white drainage) or increased drainage or redness at the wound, or calf pain, call your surgeon's office.     Change dressing    Complete by:  As directed   You may change your dressing dressing daily with sterile 4 x 4 inch gauze dressing and paper tape.  Do not submerge the incision under water.     Constipation Prevention    Complete by:  As directed   Drink plenty of fluids.  Prune juice may be helpful.  You may use a stool softener, such as Colace (over the counter) 100 mg twice a day.  Use MiraLax (over the counter) for constipation as needed.     Diet - low sodium heart healthy    Complete by:  As directed      Discharge instructions    Complete by:  As directed   Pick up  stool softner and laxative for home. Do not submerge incision under water. May shower. Continue to use ice for pain and swelling from surgery. Hip precautions.  Total Hip Protocol.  Take Xarelto for two and a half more weeks, then discontinue Xarelto. Once the patient has completed the Xarelto, they may resume the 81 mg Aspirin.  When discharged from the skilled rehab facility, please have the facility set up the patient's [place] prior to being released.  Also provide the patient with their medications at time of release from the facility to include their pain medication, the muscle relaxants, and their blood thinner medication.  If the patient is still at the rehab facility at time of follow up appointment, please also assist the patient in arranging follow up appointment in our office and any transportation needs.     Do not sit on low chairs, stoools or toilet seats, as it may be difficult to get up from low surfaces    Complete by:  As directed      Driving restrictions    Complete by:  As directed   No driving until released by the physician.     Follow the hip precautions as  taught in Physical Therapy    Complete by:  As directed      Increase activity slowly as tolerated    Complete by:  As directed      Lifting restrictions    Complete by:  As directed   No lifting until released by the physician.     Patient may shower    Complete by:  As directed   You may shower without a dressing once there is no drainage.  Do not wash over the wound.  If drainage remains, do not shower until drainage stops.     TED hose    Complete by:  As directed   Use stockings (TED hose) for 3 weeks on both leg(s).  You may remove them at night for sleeping.     Weight bearing as tolerated    Complete by:  As directed             Medication List    STOP taking these medications       aspirin 81 MG tablet      TAKE these medications       acetaminophen 325 MG tablet   Commonly known as:  TYLENOL  Take 2 tablets (650 mg total) by mouth every 6 (six) hours as needed for mild pain (or Fever >/= 101).     albuterol 108 (90 BASE) MCG/ACT inhaler  Commonly known as:  PROVENTIL HFA;VENTOLIN HFA  Inhale 2 puffs into the lungs every 6 (six) hours as needed for wheezing or shortness of breath.     bisacodyl 10 MG suppository  Commonly known as:  DULCOLAX  Place 1 suppository (10 mg total) rectally daily as needed for moderate constipation.     buPROPion 150 MG 12 hr tablet  Commonly known as:  WELLBUTRIN SR  Take 150 mg by mouth 2 (two) times daily.     busPIRone 10 MG tablet  Commonly known as:  BUSPAR  Take 10 mg by mouth 2 (two) times daily.     citalopram 10 MG tablet  Commonly known as:  CELEXA  Take 10 mg by mouth every morning.     cyclobenzaprine 10 MG tablet  Commonly known as:  FLEXERIL  Take 1 tablet (10 mg total) by mouth 2 (two) times daily.     diphenhydrAMINE 25 MG tablet  Commonly known as:  BENADRYL  Take 1 tablet (25 mg total) by mouth every 6 (six) hours as needed for itching.     doxepin 10 MG capsule  Commonly known as:  SINEQUAN  Take 10 mg by mouth at bedtime.     DSS 100 MG Caps  Take 100 mg by mouth 2 (two) times daily.     esomeprazole 20 MG capsule  Commonly known as:  NEXIUM  Take 1 capsule (20 mg total) by mouth 2 (two) times daily before a meal.     fexofenadine 180 MG tablet  Commonly known as:  ALLEGRA  Take 180 mg by mouth daily.     fluocinolone 0.01 % external solution  Commonly known as:  SYNALAR  Place 2 drops into both ears 2 (two) times daily as needed (itching).     HYDROmorphone 2 MG tablet  Commonly known as:  DILAUDID  Take 0.5-1 tablets (1-2 mg total) by mouth every 3 (three) hours as needed for moderate pain or severe pain.     irbesartan 150 MG tablet  Commonly known as:  AVAPRO  Take 150 mg by mouth 2 (  two) times daily.     iron polysaccharides 150 MG capsule  Commonly known as:   NIFEREX  Take 1 capsule (150 mg total) by mouth 2 (two) times daily.     LORazepam 0.5 MG tablet  Commonly known as:  ATIVAN  Take 1 tablet (0.5 mg total) by mouth 2 (two) times daily as needed for anxiety.     magic mouthwash Soln  Take 5 mLs by mouth 3 (three) times daily. Swish and spit TID for five days     metoCLOPramide 5 MG tablet  Commonly known as:  REGLAN  Take 1-2 tablets (5-10 mg total) by mouth every 8 (eight) hours as needed for nausea (if ondansetron (ZOFRAN) ineffective.).     ondansetron 4 MG tablet  Commonly known as:  ZOFRAN  Take 1 tablet (4 mg total) by mouth every 6 (six) hours as needed for nausea.     oxybutynin 5 MG tablet  Commonly known as:  DITROPAN  Take 1 tablet (5 mg total) by mouth 2 (two) times daily. For hyperactive bladder     polyethylene glycol packet  Commonly known as:  MIRALAX / GLYCOLAX  Take 17 g by mouth 2 (two) times daily.     rivaroxaban 10 MG Tabs tablet  Commonly known as:  XARELTO  - Take 1 tablet (10 mg total) by mouth daily with breakfast. Take Xarelto for two and a half more weeks, then discontinue Xarelto.  - Once the patient has completed the Xarelto, they may resume the 81 mg Aspirin.       Follow-up Information   Follow up with [name], MD. Schedule an appointment as soon as possible for a visit on 09/07/2013. (Call office for appointment at [phone] to setup appointment time on Tuesday 09/07/2013 for the patient.)    Specialty:  Orthopedic Surgery   Contact information:   [address] [place] [address] [phone]       Signed: Arlee Muslim, PA-C Orthopaedic Surgery 08/27/2013, 8:41 AM

## 2013-08-26 NOTE — Progress Notes (Signed)
CSW received call from [facility] that they have obtained the insurance authorization. Patient & RN, Lancy aware. CSW will setup in the morning for non-emergency ambulance to pickup @ [place], [place] Worker cell #: [phone]

## 2013-08-26 NOTE — Progress Notes (Signed)
Physical Therapy Treatment Patient Details Name: GEORGEANNA RADZIEWICZ MRN: [id number] DOB: 1950/10/03 Today's Date: 08/26/2013    History of Present Illness 63 yo female admitted with periprosthetic fracture L. s/p ORIF L hip with femoral revision 8/8. 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 Recent d/c from CIR 08/06/13.     PT Comments    POD # 5 pt in bed c/o increased BM events stated "miralax has kicked in".  Freq use of bed pan today and decrease in her pain meds has resulted in increase pain/discomfirt L hip.  In fear of having more bowel events pt requested to stay in bed so performed her THR TE's.  Follow Up Recommendations   SNF     Equipment Recommendations       Recommendations for Other Services       Precautions / Restrictions               Exercises   Total Hip Replacement TE's 10 reps ankle pumps 10 reps knee presses 10 reps heel slides 10 reps SAQ's 10 reps ABD Followed by [facility]     General Comments        Pertinent Vitals/Pain      Home Living                      Prior Function            PT Goals (current goals can now be found in the care plan section)      Frequency       PT Plan      Co-evaluation             End of Session           Time:  - 14:05 - 14:25    Charges:    1 te                   G Codes:      Rica Koyanagi  PTA [facility]  Acute  Rehab Pager      [phone]

## 2013-08-26 NOTE — Clinical Social Work Note (Signed)
Plan is for dc to [facility] at d/c-  Message left for SNF rep- also contacting family and [employer] for updates on plans/options. CSW will update and advise as word is rec'd.   Eduard Clos, MSW, [place]

## 2013-08-27 MED ORDER — LORAZEPAM 0.5 MG PO TABS
0.5000 mg | ORAL_TABLET | Freq: Two times a day (BID) | ORAL | Status: DC | PRN
  Administered 2013-08-27: 0.5 mg via ORAL
  Filled 2013-08-27: qty 1

## 2013-08-27 MED ORDER — LORAZEPAM 0.5 MG PO TABS: 0.5000 mg | ORAL_TABLET | Freq: Two times a day (BID) | ORAL | Status: DC | PRN

## 2013-08-27 NOTE — Progress Notes (Signed)
Patient is set to discharge to [facility] today. Patient & husband at bedside aware. Discharge packet at [place], [place] aware. [facility] scheduled for 11am pickup.   Clinical Social Work Department CLINICAL SOCIAL WORK PLACEMENT NOTE 08/27/2013  Patient:  [name], [name]  Account Number:  [id number] Admit date:  08/19/2013  Clinical Social Worker:  Daiva Huge  Date/time:  08/22/2013 11:35 AM  Clinical Social Work is seeking post-discharge placement for this patient at the following level of care:   SKILLED NURSING   (*CSW will update this form in Epic as items are completed)   08/22/2013  Patient/family provided with [place] Department of Clinical Social Work's list of facilities offering this level of care within the geographic area requested by the patient (or if unable, by the patient's family).  08/22/2013  Patient/family informed of their freedom to choose among providers that offer the needed level of care, that participate in Medicare, Medicaid or managed care program needed by the patient, have an available bed and are willing to accept the patient.  08/22/2013  Patient/family informed of [facility]' ownership interest in [facility], as well as of the fact that they are under no obligation to receive care at this facility.  PASARR submitted to EDS on 08/22/2013 PASARR number received on 08/22/2013  FL2 transmitted to all facilities in geographic area requested by pt/family on  08/22/2013 FL2 transmitted to all facilities within larger geographic area on   Patient informed that his/her managed care company has contracts with or will negotiate with  certain facilities, including the following:   Discussed SNF's in [facility] as an option as well as Sports administrator for [employer] and [employer].     Patient/family informed of bed offers received:  08/25/2013 Patient chooses bed at [facility] Physician recommends and patient chooses bed at    Patient to be transferred to  [facility] on  08/27/2013 Patient to be transferred to facility by [facility] Patient and family notified of transfer on 08/27/2013 Name of family member notified:  patient's husband at bedside  The following physician request were entered in Epic:   Additional Comments:   Raynaldo Opitz, [place] Social Worker cell #: [phone]

## 2013-08-27 NOTE — Progress Notes (Signed)
Subjective: 6 Days Post-Op Procedure(s) (LRB): OPEN REDUCTION INTERNAL FIXATION (ORIF) PERIPROSTHETIC FRACTURE WITH FEMORAL REVISION, LEFT FEMUR (Left) Patient reports pain as mild.   Patient seen in rounds with Dr. Wynelle Link.  Swelling continues to improve on her hip area. Patient is well, but has had some minor complaints of pain in the hip, requiring pain medications Patient is ready to go to the [facility].  Objective: Vital signs in last 24 hours: Temp:  [98 F (36.7 C)-98.7 F (37.1 C)] 98 F (36.7 C) (08/14 0526) Pulse Rate:  [90-93] 91 (08/14 0526) Resp:  [16-18] 18 (08/14 0526) BP: (117-139)/(53-72) 139/64 mmHg (08/14 0526) SpO2:  [97 %-99 %] 99 % (08/14 0526)  Intake/Output from previous day:  Intake/Output Summary (Last 24 hours) at 08/27/13 0834 Last data filed at 08/27/13 0616  Gross per 24 hour  Intake   1320 ml  Output   2000 ml  Net   -680 ml    Intake/Output this shift:    Labs: No results found for this basename: HGB,  in the last 72 hours No results found for this basename: WBC, RBC, HCT, PLT,  in the last 72 hours No results found for this basename: NA, K, CL, CO2, BUN, CREATININE, GLUCOSE, CALCIUM,  in the last 72 hours No results found for this basename: LABPT, INR,  in the last 72 hours  EXAM: General - Patient is Alert, Appropriate and Oriented Extremity - Neurovascular intact Sensation intact distally Dorsiflexion/Plantar flexion intact Incision - clean, dry, healing, scant serous drainage. Motor Function - intact, moving foot and toes well on exam.   Assessment/Plan: 6 Days Post-Op Procedure(s) (LRB): OPEN REDUCTION INTERNAL FIXATION (ORIF) PERIPROSTHETIC FRACTURE WITH FEMORAL REVISION, LEFT FEMUR (Left) Procedure(s) (LRB): OPEN REDUCTION INTERNAL FIXATION (ORIF) PERIPROSTHETIC FRACTURE WITH FEMORAL REVISION, LEFT FEMUR (Left) Past Medical History  Diagnosis Date  . Complication of anesthesia     NAUSEA  .  Chest pain     pt states due to esophageal problem -  primary care MD aware   . Hives     since pneumonia vaccine   . Asthma   . Depression   . Anxiety   . Fibromyalgia   . Spondylosis   . Bulging disc   . 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 Phantom limb pain     rt leg  . Leg pain, left   . Eczema     ears  . GERD (gastroesophageal reflux disease)     " alot of burping"  . Constipation, chronic   . Urgency incontinence   . History of transfusion   . Cancer     hx sarcoma rt knee with amputation  . Difficulty sleeping   . Sleep apnea     pt had recent sleep study done but does not know results - report called for and put on chart showing significant obstructive sleep apnea  . Hypertension   . Back pain 08/19/13    low back and  left leg pain due to nerve problems per pt  . PONV (postoperative nausea and vomiting)   . Peripheral neuropathy   . History of bronchitis   . History of pneumonia   . Hiatal hernia   . History of scarlet fever   . Postmenopausal   . History of measles   . History of mumps   . 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   Weight as of this encounter: 70.308 kg (155 lb).  Transfer to [facility]  Follow up - in 1 week, next Friday August 21st  Activity - WBAT  Disposition - Skilled nursing facility  Condition Upon Discharge - Pending  D/C Meds - See DC Summary  DVT Prophylaxis - [place], PA-C Orthopaedic Surgery 08/27/2013, 8:34 AM

## 2013-09-06 ENCOUNTER — Encounter (HOSPITAL_COMMUNITY): Payer: Self-pay | Admitting: Emergency Medicine

## 2013-09-06 ENCOUNTER — Emergency Department (HOSPITAL_COMMUNITY): Payer: BC Managed Care – PPO

## 2013-09-06 ENCOUNTER — Inpatient Hospital Stay (HOSPITAL_COMMUNITY)
Admission: EM | Admit: 2013-09-06 | Discharge: 2013-09-21 | DRG: 466 | Disposition: A | Discharge status: Skilled Nursing Facility | Payer: BC Managed Care – PPO | Attending: Internal Medicine | Admitting: Internal Medicine

## 2013-09-06 DIAGNOSIS — Z881 Allergy status to other antibiotic agents status: Secondary | ICD-10-CM

## 2013-09-06 DIAGNOSIS — J9819 Other pulmonary collapse: Secondary | ICD-10-CM | POA: Diagnosis not present

## 2013-09-06 DIAGNOSIS — Z8249 Family history of ischemic heart disease and other diseases of the circulatory system: Secondary | ICD-10-CM

## 2013-09-06 DIAGNOSIS — L299 Pruritus, unspecified: Secondary | ICD-10-CM | POA: Diagnosis present

## 2013-09-06 DIAGNOSIS — F329 Major depressive disorder, single episode, unspecified: Secondary | ICD-10-CM | POA: Diagnosis present

## 2013-09-06 DIAGNOSIS — T8140XA Infection following a procedure, unspecified, initial encounter: Secondary | ICD-10-CM

## 2013-09-06 DIAGNOSIS — G4733 Obstructive sleep apnea (adult) (pediatric): Secondary | ICD-10-CM

## 2013-09-06 DIAGNOSIS — I10 Essential (primary) hypertension: Secondary | ICD-10-CM

## 2013-09-06 DIAGNOSIS — Z79899 Other long term (current) drug therapy: Secondary | ICD-10-CM

## 2013-09-06 DIAGNOSIS — J45909 Unspecified asthma, uncomplicated: Secondary | ICD-10-CM

## 2013-09-06 DIAGNOSIS — Y831 Surgical operation with implant of artificial internal device as the cause of abnormal reaction of the patient, or of later complication, without mention of misadventure at the time of the procedure: Secondary | ICD-10-CM | POA: Diagnosis present

## 2013-09-06 DIAGNOSIS — E871 Hypo-osmolality and hyponatremia: Secondary | ICD-10-CM

## 2013-09-06 DIAGNOSIS — T84039A Mechanical loosening of unspecified internal prosthetic joint, initial encounter: Principal | ICD-10-CM | POA: Diagnosis present

## 2013-09-06 DIAGNOSIS — Z91041 Radiographic dye allergy status: Secondary | ICD-10-CM | POA: Diagnosis not present

## 2013-09-06 DIAGNOSIS — K219 Gastro-esophageal reflux disease without esophagitis: Secondary | ICD-10-CM | POA: Diagnosis present

## 2013-09-06 DIAGNOSIS — Z96649 Presence of unspecified artificial hip joint: Secondary | ICD-10-CM | POA: Diagnosis not present

## 2013-09-06 DIAGNOSIS — M869 Osteomyelitis, unspecified: Secondary | ICD-10-CM | POA: Diagnosis present

## 2013-09-06 DIAGNOSIS — T8149XA Infection following a procedure, other surgical site, initial encounter: Secondary | ICD-10-CM

## 2013-09-06 DIAGNOSIS — Z887 Allergy status to serum and vaccine status: Secondary | ICD-10-CM

## 2013-09-06 DIAGNOSIS — D473 Essential (hemorrhagic) thrombocythemia: Secondary | ICD-10-CM | POA: Diagnosis present

## 2013-09-06 DIAGNOSIS — R3129 Other microscopic hematuria: Secondary | ICD-10-CM | POA: Diagnosis not present

## 2013-09-06 DIAGNOSIS — D72829 Elevated white blood cell count, unspecified: Secondary | ICD-10-CM | POA: Diagnosis not present

## 2013-09-06 DIAGNOSIS — D62 Acute posthemorrhagic anemia: Secondary | ICD-10-CM | POA: Diagnosis not present

## 2013-09-06 DIAGNOSIS — Z9289 Personal history of other medical treatment: Secondary | ICD-10-CM

## 2013-09-06 DIAGNOSIS — R0789 Other chest pain: Secondary | ICD-10-CM

## 2013-09-06 DIAGNOSIS — T84031A Mechanical loosening of internal left hip prosthetic joint, initial encounter: Secondary | ICD-10-CM

## 2013-09-06 DIAGNOSIS — T84029A Dislocation of unspecified internal joint prosthesis, initial encounter: Secondary | ICD-10-CM | POA: Diagnosis present

## 2013-09-06 DIAGNOSIS — F411 Generalized anxiety disorder: Secondary | ICD-10-CM

## 2013-09-06 DIAGNOSIS — Z8701 Personal history of pneumonia (recurrent): Secondary | ICD-10-CM | POA: Diagnosis not present

## 2013-09-06 DIAGNOSIS — M25559 Pain in unspecified hip: Secondary | ICD-10-CM | POA: Diagnosis present

## 2013-09-06 DIAGNOSIS — R6510 Systemic inflammatory response syndrome (SIRS) of non-infectious origin without acute organ dysfunction: Secondary | ICD-10-CM

## 2013-09-06 DIAGNOSIS — J9600 Acute respiratory failure, unspecified whether with hypoxia or hypercapnia: Secondary | ICD-10-CM | POA: Diagnosis not present

## 2013-09-06 DIAGNOSIS — G547 Phantom limb syndrome without pain: Secondary | ICD-10-CM

## 2013-09-06 DIAGNOSIS — T84031D Mechanical loosening of internal left hip prosthetic joint, subsequent encounter: Secondary | ICD-10-CM

## 2013-09-06 DIAGNOSIS — Z880 Allergy status to penicillin: Secondary | ICD-10-CM | POA: Diagnosis not present

## 2013-09-06 DIAGNOSIS — M25552 Pain in left hip: Secondary | ICD-10-CM

## 2013-09-06 DIAGNOSIS — Z8619 Personal history of other infectious and parasitic diseases: Secondary | ICD-10-CM

## 2013-09-06 DIAGNOSIS — T84029D Dislocation of unspecified internal joint prosthesis, subsequent encounter: Secondary | ICD-10-CM

## 2013-09-06 DIAGNOSIS — Z87891 Personal history of nicotine dependence: Secondary | ICD-10-CM | POA: Diagnosis not present

## 2013-09-06 DIAGNOSIS — R079 Chest pain, unspecified: Secondary | ICD-10-CM

## 2013-09-06 DIAGNOSIS — I4949 Other premature depolarization: Secondary | ICD-10-CM | POA: Diagnosis present

## 2013-09-06 DIAGNOSIS — Z888 Allergy status to other drugs, medicaments and biological substances status: Secondary | ICD-10-CM | POA: Diagnosis not present

## 2013-09-06 DIAGNOSIS — IMO0002 Reserved for concepts with insufficient information to code with codable children: Secondary | ICD-10-CM | POA: Diagnosis not present

## 2013-09-06 DIAGNOSIS — L259 Unspecified contact dermatitis, unspecified cause: Secondary | ICD-10-CM

## 2013-09-06 DIAGNOSIS — IMO0001 Reserved for inherently not codable concepts without codable children: Secondary | ICD-10-CM

## 2013-09-06 DIAGNOSIS — D649 Anemia, unspecified: Secondary | ICD-10-CM

## 2013-09-06 DIAGNOSIS — T814XXD Infection following a procedure, subsequent encounter: Secondary | ICD-10-CM

## 2013-09-06 DIAGNOSIS — F3289 Other specified depressive episodes: Secondary | ICD-10-CM | POA: Diagnosis present

## 2013-09-06 DIAGNOSIS — M797 Fibromyalgia: Secondary | ICD-10-CM

## 2013-09-06 DIAGNOSIS — K449 Diaphragmatic hernia without obstruction or gangrene: Secondary | ICD-10-CM

## 2013-09-06 DIAGNOSIS — S78119A Complete traumatic amputation at level between unspecified hip and knee, initial encounter: Secondary | ICD-10-CM

## 2013-09-06 DIAGNOSIS — B999 Unspecified infectious disease: Secondary | ICD-10-CM

## 2013-09-06 DIAGNOSIS — M9702XA Periprosthetic fracture around internal prosthetic left hip joint, initial encounter: Secondary | ICD-10-CM

## 2013-09-06 DIAGNOSIS — F32A Depression, unspecified: Secondary | ICD-10-CM | POA: Diagnosis present

## 2013-09-06 DIAGNOSIS — Z8589 Personal history of malignant neoplasm of other organs and systems: Secondary | ICD-10-CM

## 2013-09-06 LAB — CBC WITH DIFFERENTIAL/PLATELET
BASOS ABS: 0 10*3/uL (ref 0.0–0.1)
Basophils Relative: 0 % (ref 0–1)
EOS ABS: 0.4 10*3/uL (ref 0.0–0.7)
EOS PCT: 4 % (ref 0–5)
HCT: 33 % — ABNORMAL LOW (ref 36.0–46.0)
Hemoglobin: 10.6 g/dL — ABNORMAL LOW (ref 12.0–15.0)
LYMPHS PCT: 29 % (ref 12–46)
Lymphs Abs: 2.8 10*3/uL (ref 0.7–4.0)
MCH: 29.4 pg (ref 26.0–34.0)
MCHC: 32.1 g/dL (ref 30.0–36.0)
MCV: 91.7 fL (ref 78.0–100.0)
MONO ABS: 1 10*3/uL (ref 0.1–1.0)
Monocytes Relative: 11 % (ref 3–12)
Neutro Abs: 5.6 10*3/uL (ref 1.7–7.7)
Neutrophils Relative %: 56 % (ref 43–77)
PLATELETS: 565 10*3/uL — AB (ref 150–400)
RBC: 3.6 MIL/uL — ABNORMAL LOW (ref 3.87–5.11)
RDW: 14 % (ref 11.5–15.5)
WBC: 9.8 10*3/uL (ref 4.0–10.5)

## 2013-09-06 LAB — BASIC METABOLIC PANEL
ANION GAP: 12 (ref 5–15)
BUN: 15 mg/dL (ref 6–23)
CALCIUM: 8.9 mg/dL (ref 8.4–10.5)
CO2: 25 mEq/L (ref 19–32)
Chloride: 103 mEq/L (ref 96–112)
Creatinine, Ser: 0.7 mg/dL (ref 0.50–1.10)
GFR calc Af Amer: >90 mL/min (ref >90)
Glucose, Bld: 136 mg/dL — ABNORMAL HIGH (ref 70–99)
Potassium: 4.1 mEq/L (ref 3.7–5.3)
Sodium: 140 mEq/L (ref 137–147)

## 2013-09-06 LAB — PROTIME-INR
INR: 1.28 (ref 0.00–1.49)
Prothrombin Time: 16 seconds — ABNORMAL HIGH (ref 11.6–15.2)

## 2013-09-06 MED ORDER — SODIUM CHLORIDE 0.9 % IV SOLN
20.0000 mL | INTRAVENOUS | Status: DC
  Administered 2013-09-06: 20 mL via INTRAVENOUS

## 2013-09-06 MED ORDER — DOXEPIN HCL 10 MG PO CAPS
10.0000 mg | ORAL_CAPSULE | Freq: Every day | ORAL | Status: DC
  Administered 2013-09-06 – 2013-09-20 (×15): 10 mg via ORAL
  Filled 2013-09-06 (×16): qty 1

## 2013-09-06 MED ORDER — PANTOPRAZOLE SODIUM 40 MG PO TBEC
40.0000 mg | DELAYED_RELEASE_TABLET | Freq: Every day | ORAL | Status: DC
  Filled 2013-09-06: qty 1

## 2013-09-06 MED ORDER — METOCLOPRAMIDE HCL 10 MG PO TABS: 5.0000 mg | ORAL_TABLET | Freq: Three times a day (TID) | ORAL | Status: DC | PRN

## 2013-09-06 MED ORDER — OXYBUTYNIN CHLORIDE ER 5 MG PO TB24
5.0000 mg | ORAL_TABLET | Freq: Every day | ORAL | Status: DC
  Administered 2013-09-07 – 2013-09-21 (×14): 5 mg via ORAL
  Filled 2013-09-06 (×15): qty 1

## 2013-09-06 MED ORDER — BUSPIRONE HCL 10 MG PO TABS
10.0000 mg | ORAL_TABLET | Freq: Two times a day (BID) | ORAL | Status: DC
  Administered 2013-09-06 – 2013-09-21 (×28): 10 mg via ORAL
  Filled 2013-09-06 (×31): qty 1

## 2013-09-06 MED ORDER — POLYETHYLENE GLYCOL 3350 17 G PO PACK
17.0000 g | PACK | Freq: Two times a day (BID) | ORAL | Status: DC
  Administered 2013-09-06 – 2013-09-13 (×9): 17 g via ORAL

## 2013-09-06 MED ORDER — FLUOCINOLONE ACETONIDE 0.01 % EX SOLN: 2.0000 [drp] | Freq: Two times a day (BID) | CUTANEOUS | Status: DC | PRN

## 2013-09-06 MED ORDER — CYCLOBENZAPRINE HCL 10 MG PO TABS
10.0000 mg | ORAL_TABLET | Freq: Two times a day (BID) | ORAL | Status: DC
  Administered 2013-09-06 – 2013-09-21 (×29): 10 mg via ORAL
  Filled 2013-09-06 (×37): qty 1

## 2013-09-06 MED ORDER — ACETAMINOPHEN 325 MG PO TABS: 650.0000 mg | ORAL_TABLET | Freq: Four times a day (QID) | ORAL | Status: DC | PRN

## 2013-09-06 MED ORDER — HYDROMORPHONE HCL 2 MG PO TABS
1.0000 mg | ORAL_TABLET | ORAL | Status: DC | PRN
  Administered 2013-09-07 – 2013-09-21 (×50): 2 mg via ORAL
  Filled 2013-09-06 (×53): qty 1

## 2013-09-06 MED ORDER — POLYSACCHARIDE IRON COMPLEX 150 MG PO CAPS
150.0000 mg | ORAL_CAPSULE | Freq: Two times a day (BID) | ORAL | Status: DC
  Administered 2013-09-06 – 2013-09-21 (×28): 150 mg via ORAL
  Filled 2013-09-06 (×31): qty 1

## 2013-09-06 MED ORDER — DIPHENHYDRAMINE HCL 25 MG PO CAPS
25.0000 mg | ORAL_CAPSULE | Freq: Four times a day (QID) | ORAL | Status: DC | PRN
  Administered 2013-09-07: 25 mg via ORAL
  Filled 2013-09-06 (×2): qty 1

## 2013-09-06 MED ORDER — LORAZEPAM 0.5 MG PO TABS
0.5000 mg | ORAL_TABLET | Freq: Two times a day (BID) | ORAL | Status: DC | PRN
Start: 2013-09-06 — End: 2013-09-08
  Administered 2013-09-08: 0.5 mg via ORAL
  Filled 2013-09-06: qty 1

## 2013-09-06 MED ORDER — BUPROPION HCL ER (SR) 150 MG PO TB12
150.0000 mg | ORAL_TABLET | Freq: Two times a day (BID) | ORAL | Status: DC
  Administered 2013-09-06 – 2013-09-21 (×28): 150 mg via ORAL
  Filled 2013-09-06 (×31): qty 1

## 2013-09-06 MED ORDER — SENNA 8.6 MG PO TABS
1.0000 | ORAL_TABLET | Freq: Two times a day (BID) | ORAL | Status: DC
  Administered 2013-09-06 – 2013-09-14 (×11): 8.6 mg via ORAL

## 2013-09-06 MED ORDER — IRBESARTAN 150 MG PO TABS
150.0000 mg | ORAL_TABLET | Freq: Two times a day (BID) | ORAL | Status: DC
  Administered 2013-09-06 – 2013-09-07 (×3): 150 mg via ORAL
  Filled 2013-09-06 (×5): qty 1

## 2013-09-06 MED ORDER — ONDANSETRON HCL 4 MG PO TABS: 4.0000 mg | ORAL_TABLET | Freq: Four times a day (QID) | ORAL | Status: DC | PRN

## 2013-09-06 MED ORDER — ALBUTEROL SULFATE (2.5 MG/3ML) 0.083% IN NEBU: 3.0000 mL | INHALATION_SOLUTION | Freq: Four times a day (QID) | RESPIRATORY_TRACT | Status: DC | PRN

## 2013-09-06 MED ORDER — HYDROMORPHONE HCL PF 1 MG/ML IJ SOLN
0.5000 mg | INTRAMUSCULAR | Status: DC | PRN
  Administered 2013-09-06 – 2013-09-08 (×2): 1 mg via INTRAVENOUS
  Filled 2013-09-06 (×2): qty 1

## 2013-09-06 MED ORDER — CITALOPRAM HYDROBROMIDE 10 MG PO TABS
10.0000 mg | ORAL_TABLET | Freq: Every morning | ORAL | Status: DC
  Administered 2013-09-07 – 2013-09-21 (×13): 10 mg via ORAL
  Filled 2013-09-06 (×15): qty 1

## 2013-09-06 NOTE — ED Notes (Signed)
Bed: LF81 Expected date:  Expected time:  Means of arrival:  Comments: EMS- dislocated hip, femur fracture

## 2013-09-06 NOTE — ED Notes (Signed)
Pt had L hip replacement in July. Last week had back surgery. Pt began having increased leg pain since Thursday. Pt at time said she "felt leg drop." Nursing facility [facility] did x ray which showed dislocation. Pt also noticed redness at incision site. Pt R leg amputee.

## 2013-09-06 NOTE — Consult Note (Signed)
Reason for Consult: Left hip pain with arthroplasty dislocation Referring Physician: ADANELY REYNOSO is an 63 y.o. female.  HPI: Patient presents with Left hip pain. She is a Patient of Dr. Wynelle Link who did a arthroplasty on July 8th and most recently due to a periprosthetic fracture required a revision 2 weeks ago. She has been at the [facility] for rehab since. This past Thursday, she rolled on her side after using the bedpan when she felt a pop and her leg collapsed. Minimal pain at the time , but increased over the weekend. Today due to her pain and edema a x ray was ordered and found her hip was dislocated. She was then brought to the [facility] via EMS for further evaluation. LAbs and imaging obtained which confirmed the diagnosis. Denies SOB, CP, or numbness/ tingling in LLE. 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  Past Medical History  Diagnosis Date  . Complication of anesthesia     NAUSEA  . Chest pain     pt states due to esophageal problem -  primary care MD aware   . Hives     since pneumonia vaccine   . Asthma   . Depression   . Anxiety   . Fibromyalgia   . Spondylosis   . Bulging disc   . 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 Phantom limb pain     rt leg  . Leg pain, left   . Eczema     ears  . GERD (gastroesophageal reflux disease)     " alot of burping"  . Constipation, chronic   . Urgency incontinence   . History of transfusion   . Cancer     hx sarcoma rt knee with amputation  . Difficulty sleeping   . Sleep apnea     pt had recent sleep study done but does not know results - report called for and put on chart showing significant obstructive sleep apnea  . Hypertension   . Back pain 08/19/13    low back and  left leg pain due to nerve problems per pt  . PONV (postoperative nausea and vomiting)   . Peripheral neuropathy   . History of bronchitis   . History of pneumonia   . Hiatal hernia   . History of scarlet fever   . Postmenopausal    . History of measles   . History of mumps   . History of rubella     Past Surgical History  Procedure Laterality Date  . Abdominal hysterectomy  1980'S  . Sarcoma  1973    RT KNEE (aka)  . External ear surgery  AGE 20  . 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 Orif periprosthetic fracture Left 08/21/2013    Procedure: OPEN REDUCTION INTERNAL FIXATION (ORIF) PERIPROSTHETIC FRACTURE WITH FEMORAL REVISION, LEFT FEMUR;  Surgeon: [name], MD;  Location: [facility];  Service: Orthopedics;  Laterality: Left;  . Back surgery      Family History  Problem Relation Age of Onset  . CAD Sister     CABG, stents  . CAD Father     Social History:  reports that she quit smoking about 6 years ago. Her smoking use included Cigarettes. She smoked 0.00 packs per day. She  has never used smokeless tobacco. She reports that she does not drink alcohol or use illicit drugs.  Allergies:  Allergies  Allergen Reactions  . Bactrim [Sulfamethoxazole-Tmp Ds] Shortness Of Breath  . Cefuroxime Shortness Of Breath  . Omnipaque [Iohexol] Shortness Of Breath    Patient stated that she had a reaction a few years ago to the IV dye and had trouble breathing--so for her CT Angio Chest today (07/12/13), ER MD gave her solumedrol and Benadryl before she had test and did very well with that.  ak 07/12/13  . Pneumococcal Vaccines Hives    Hives on arm after shot  . Penicillins Diarrhea  . Sulfa Antibiotics Other (See Comments)    unknown    Medications: I have reviewed the patient's current medications.  Results for orders placed during the hospital encounter of 09/06/13 (from the past 48 hour(s))  CBC WITH DIFFERENTIAL     Status: Abnormal   Collection Time    09/06/13  6:44 PM      Result Value Ref Range   WBC 9.8  4.0 - 10.5 K/uL   RBC 3.60 (*) 3.87 - 5.11  MIL/uL   Hemoglobin 10.6 (*) 12.0 - 15.0 g/dL   HCT 33.0 (*) 36.0 - 46.0 %   MCV 91.7  78.0 - 100.0 fL   MCH 29.4  26.0 - 34.0 pg   MCHC 32.1  30.0 - 36.0 g/dL   RDW 14.0  11.5 - 15.5 %   Platelets 565 (*) 150 - 400 K/uL   Neutrophils Relative % 56  43 - 77 %   Neutro Abs 5.6  1.7 - 7.7 K/uL   Lymphocytes Relative 29  12 - 46 %   Lymphs Abs 2.8  0.7 - 4.0 K/uL   Monocytes Relative 11  3 - 12 %   Monocytes Absolute 1.0  0.1 - 1.0 K/uL   Eosinophils Relative 4  0 - 5 %   Eosinophils Absolute 0.4  0.0 - 0.7 K/uL   Basophils Relative 0  0 - 1 %   Basophils Absolute 0.0  0.0 - 0.1 K/uL    Dg Hip Complete Left  09/06/2013   CLINICAL DATA:  Left leg pain  EXAM: LEFT HIP - COMPLETE 2+ VIEW  COMPARISON:  None.  FINDINGS: There is a left total hip arthroplasty. There is superior dislocation of the left femoral head component relative to the acetabular component. There is no acute fracture.  There is generalized osteopenia.  IMPRESSION: Left total hip arthroplasty with superior dislocation of the left femoral head component relative to the acetabulum.   Electronically Signed   By: Kathreen Devoid   On: 09/06/2013 18:25   Dg Femur Left  09/06/2013   CLINICAL DATA:  Increasing LEFT leg pain on Thursday, post hip replacement in July, hip injury  EXAM: LEFT FEMUR - 2 VIEW  COMPARISON:  Preoperative exam 07/12/2013  FINDINGS: Osseous demineralization.  LEFT hip prosthesis identified with superior dislocation of the femoral component.  Cerclage wires and lateral plate at the proximal LEFT femur appear intact.  No periprosthetic lucency or fracture identified.  Visualized pelvis intact.  Knee joint spaces preserved.  IMPRESSION: Dislocated LEFT hip prosthesis.  Osseous demineralization.   Electronically Signed   By: Lavonia Dana M.D.   On: 09/06/2013 18:24    Review of Systems  Constitutional: Negative.   HENT: Negative.   Eyes: Negative.   Respiratory: Negative.   Cardiovascular: Negative.    Gastrointestinal: Negative.   Genitourinary:  Negative.   Musculoskeletal: Positive for joint pain.  Skin: Negative.   Neurological: Negative.   Endo/Heme/Allergies: Negative.   Psychiatric/Behavioral: Positive for depression. The patient is nervous/anxious.    Blood pressure 121/61, pulse 85, temperature 98.6 F (37 C), temperature source Oral, resp. rate 16, SpO2 97.00%. Physical Exam  Constitutional: She is oriented to person, place, and time. She appears well-developed.  HENT:  Head: Normocephalic and atraumatic.  Eyes: EOM are normal.  Neck: Normal range of motion.  Cardiovascular: Normal rate and intact distal pulses.   Respiratory: Effort normal.  GI: Soft.  Genitourinary:  Deferred  Musculoskeletal: She exhibits edema and tenderness (Left hip).  Right AKA. Left hip edema and tenderness. Anterior incision and posterior incisions. Dressing C/D/I on posterior.erthyema noted and warm to the touch. Anterior WNL. RLE N/V intact. Sciatic intact. Foot is rotated slightly. Patient can plantar and dorsi flex well. Knee is benign. No Compartment syndrome indications.   Neurological: She is alert and oriented to person, place, and time.  Skin: Skin is warm and dry.  Psychiatric: Her behavior is normal.    Assessment/Plan: Left hip arthroplasty dislocation: Bedrest Regular diet Hold Xeralto Dr. Wynelle Link to see in am Plan for left hip revision Wednesday. Family updated and all questions were encouraged and answered in detail.  Elie Leppo L 09/06/2013, 7:09 PM

## 2013-09-06 NOTE — ED Notes (Signed)
Patient transported to X-ray 

## 2013-09-06 NOTE — ED Provider Notes (Signed)
CSN: [id number]     Arrival date & time 09/06/13  1710 History   First MD Initiated Contact with Patient 09/06/13 1721     Chief Complaint  Patient presents with  . Hip Injury   HPI  Patient is a 63 y.o. Female With a PMH of a left total hip replacement with recent revision  who presents to the ED with left hip pain x 4 days.  Patient states that she was in her rehab facility on Thursday and was trying to get on her bed pain with her hip flexed when her leg fell and her hip internally rotated and felt a pop.  Patient states that she has had pain since then.  Patient has been non-weight bearing on this leg since then.  She denies tingling, numbness, swelling, fever, chills, redness around her incision, shortness of breath, chest pain.  All other ROS are negative.   Past Medical History  Diagnosis Date  . Complication of anesthesia     NAUSEA  . Chest pain     pt states due to esophageal problem -  primary care MD aware   . Hives     since pneumonia vaccine   . Asthma   . Depression   . Anxiety   . Fibromyalgia   . Spondylosis   . Bulging disc   . 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 Phantom limb pain     rt leg  . Leg pain, left   . Eczema     ears  . GERD (gastroesophageal reflux disease)     " alot of burping"  . Constipation, chronic   . Urgency incontinence   . History of transfusion   . Cancer     hx sarcoma rt knee with amputation  . Difficulty sleeping   . Sleep apnea     pt had recent sleep study done but does not know results - report called for and put on chart showing significant obstructive sleep apnea  . Hypertension   . Back pain 08/19/13    low back and  left leg pain due to nerve problems per pt  . PONV (postoperative nausea and vomiting)   . Peripheral neuropathy   . History of bronchitis   . History of pneumonia   . Hiatal hernia   . History of scarlet fever   . Postmenopausal   . History of measles   . History of mumps   . History of  rubella    Past Surgical History  Procedure Laterality Date  . Abdominal hysterectomy  1980'S  . Sarcoma  1973    RT KNEE (aka)  . External ear surgery  AGE 45  . 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 Orif periprosthetic fracture Left 08/21/2013    Procedure: OPEN REDUCTION INTERNAL FIXATION (ORIF) PERIPROSTHETIC FRACTURE WITH FEMORAL REVISION, LEFT FEMUR;  Surgeon: [name], MD;  Location: [facility];  Service: Orthopedics;  Laterality: Left;  . Back surgery     Family History  Problem Relation Age of Onset  . CAD Sister     CABG, stents  . CAD Father    History  Substance Use Topics  . Smoking status: Former Smoker    Types: Cigarettes    Quit date: 07/13/2007  . Smokeless  tobacco: Never Used  . Alcohol Use: No   OB History   Grav Para Term Preterm Abortions TAB SAB Ect Mult Living                 Review of Systems  See HPI  Allergies  Bactrim; Cefuroxime; Omnipaque; Pneumococcal vaccines; Penicillins; and Sulfa antibiotics  Home Medications   Prior to Admission medications   Medication Sig Start Date End Date Taking? Authorizing Provider  acetaminophen (TYLENOL) 325 MG tablet Take 2 tablets (650 mg total) by mouth every 6 (six) hours as needed for mild pain (or Fever >/= 101). 08/26/13  Yes Arlee Muslim, PA-C  albuterol (PROVENTIL HFA;VENTOLIN HFA) 108 (90 BASE) MCG/ACT inhaler Inhale 2 puffs into the lungs every 6 (six) hours as needed for wheezing or shortness of breath.   Yes Historical Provider, MD  buPROPion (WELLBUTRIN SR) 150 MG 12 hr tablet Take 150 mg by mouth 2 (two) times daily.   Yes Historical Provider, MD  busPIRone (BUSPAR) 10 MG tablet Take 10 mg by mouth 2 (two) times daily.   Yes Historical Provider, MD  Calcium-Vitamin D (CALTRATE 600 PLUS-VIT D PO) Take 1 tablet by mouth 2 (two) times daily.    Yes Historical Provider, MD  citalopram (CELEXA) 10 MG tablet Take 10 mg by mouth every morning.   Yes Historical Provider, MD  cyclobenzaprine (FLEXERIL) 10 MG tablet Take 1 tablet (10 mg total) by mouth 2 (two) times daily. 08/26/13  Yes Arlee Muslim, PA-C  diphenhydrAMINE (BENADRYL) 25 MG tablet Take 1 tablet (25 mg total) by mouth every 6 (six) hours as needed for itching. 07/14/13  Yes Nishant Dhungel, MD  doxepin (SINEQUAN) 10 MG capsule Take 10 mg by mouth at bedtime.   Yes Historical Provider, MD  esomeprazole (NEXIUM) 20 MG capsule Take 1 capsule (20 mg total) by mouth 2 (two) times daily before a meal. 07/14/13  Yes Nishant Dhungel, MD  fexofenadine (ALLEGRA) 180 MG tablet Take 180 mg by mouth daily.   Yes Historical Provider, MD  fluocinolone (SYNALAR) 0.01 % external solution Place 2 drops into both ears 2 (two) times daily as needed (itching).   Yes Historical Provider, MD  HYDROmorphone (DILAUDID) 2 MG tablet Take 0.5-1 tablets (1-2 mg total) by mouth every 3 (three) hours as needed for moderate pain or severe pain. 08/26/13  Yes Arlee Muslim, PA-C  irbesartan (AVAPRO) 150 MG tablet Take 150 mg by mouth 2 (two) times daily.   Yes Historical Provider, MD  iron polysaccharides (NIFEREX) 150 MG capsule Take 1 capsule (150 mg total) by mouth 2 (two) times daily. 08/06/13  Yes Ivan Anchors Love, PA-C  LORazepam (ATIVAN) 0.5 MG tablet Take 1 tablet (0.5 mg total) by mouth 2 (two) times daily as needed for anxiety. 08/27/13  Yes Arlee Muslim, PA-C  metoCLOPramide (REGLAN) 5 MG tablet Take 1-2 tablets (5-10 mg total) by mouth every 8 (eight) hours as needed for nausea (if ondansetron (ZOFRAN) ineffective.). 08/26/13  Yes Arlee Muslim, PA-C  Multiple Vitamins-Iron (MULTIVITAMINS WITH IRON) TABS tablet Take 1 tablet by mouth daily.   Yes Historical Provider, MD  ondansetron (ZOFRAN) 4 MG tablet Take 1 tablet (4 mg total) by mouth every 6 (six) hours as needed for nausea. 08/26/13  Yes Arlee Muslim, PA-C   oxybutynin (DITROPAN-XL) 5 MG 24 hr tablet Take 5 mg by mouth daily.   Yes Historical Provider, MD  polyethylene glycol (MIRALAX / GLYCOLAX) packet Take 17 g by mouth 2 (two) times daily. 08/06/13  Yes Ivan Anchors Love, PA-C  rivaroxaban (XARELTO) 10 MG TABS tablet Take 1 tablet (10 mg total) by mouth daily with breakfast. Take Xarelto for two and a half more weeks, then discontinue Xarelto. Once the patient has completed the Xarelto, they may resume the 81 mg Aspirin. 08/26/13  Yes Arlee Muslim, PA-C  senna (SENOKOT) 8.6 MG TABS tablet Take 1 tablet by mouth 2 (two) times daily.   Yes Historical Provider, MD   BP 121/61  Pulse 85  Temp(Src) 98.6 F (37 C) (Oral)  Resp 16  SpO2 97% Physical Exam  Nursing note and vitals reviewed. Constitutional: She is oriented to person, place, and time. She appears well-developed and well-nourished. No distress.  HENT:  Head: Normocephalic and atraumatic.  Mouth/Throat: Oropharynx is clear and moist. No oropharyngeal exudate.  Eyes: Conjunctivae and EOM are normal. Pupils are equal, round, and reactive to light. No scleral icterus.  Neck: Normal range of motion. Neck supple. No JVD present. No thyromegaly present.  Cardiovascular: Normal rate, regular rhythm, normal heart sounds and intact distal pulses.  Exam reveals no gallop and no friction rub.   No murmur heard. Pulses:      Dorsalis pedis pulses are 1+ on the right side, and 1+ on the left side.       Posterior tibial pulses are 1+ on the right side, and 1+ on the left side.  Pulmonary/Chest: Effort normal and breath sounds normal. No respiratory distress. She has no wheezes. She has no rales. She exhibits no tenderness.  Abdominal: Soft. Bowel sounds are normal. She exhibits no distension and no mass. There is no tenderness. There is no rebound and no guarding.  Musculoskeletal:       Left hip: She exhibits decreased range of motion, bony tenderness, crepitus and deformity. She exhibits normal  strength, no swelling and no laceration.  There is tenderness to palpation over the lateral trochanter and the groin.  There is crepitus with PROM.  Patient is unable to flex the hip actively.  There is no obvious shortening.  Normal sensation in the leg to light touch.  2+ Cap refill in the left foot.  Right BTK amputation  Lymphadenopathy:    She has no cervical adenopathy.  Neurological: She is alert and oriented to person, place, and time. No cranial nerve deficit. Coordination normal.  Skin: Skin is warm and dry. She is not diaphoretic.  Psychiatric: She has a normal mood and affect. Her behavior is normal. Judgment and thought content normal.    ED Course  Procedures (including critical care time) Labs Review Labs Reviewed  CBC WITH DIFFERENTIAL  BASIC METABOLIC PANEL  PROTIME-INR  TYPE AND SCREEN    Imaging Review Dg Hip Complete Left  09/06/2013   CLINICAL DATA:  Left leg pain  EXAM: LEFT HIP - COMPLETE 2+ VIEW  COMPARISON:  None.  FINDINGS: There is a left total hip arthroplasty. There is superior dislocation of the left femoral head component relative to the acetabular component. There is no acute fracture.  There is generalized osteopenia.  IMPRESSION: Left total hip arthroplasty with superior dislocation of the left femoral head component relative to the acetabulum.   Electronically Signed   By: Kathreen Devoid   On: 09/06/2013 18:25   Dg Femur Left  09/06/2013   CLINICAL DATA:  Increasing LEFT leg pain on Thursday, post hip replacement in July, hip injury  EXAM: LEFT FEMUR - 2 VIEW  COMPARISON:  Preoperative exam 07/12/2013  FINDINGS: Osseous demineralization.  LEFT hip prosthesis identified with superior dislocation of the femoral component.  Cerclage wires and lateral plate at the proximal LEFT femur appear intact.  No periprosthetic lucency or fracture identified.  Visualized pelvis intact.  Knee joint spaces preserved.  IMPRESSION: Dislocated LEFT hip prosthesis.  Osseous  demineralization.   Electronically Signed   By: Lavonia Dana M.D.   On: 09/06/2013 18:24     EKG Interpretation None      MDM   Final diagnoses:  Dislocation of hip prosthesis, initial encounter   Patient is a 63 y.o. Female who presents with left hip pain.  Patient is found to have left total hip dislocation and damage to repair.  Basic labs were drawn here in the ED and are currently pending.  Ortho PA Jerilynn Mages saw the patient here in the ED and I also spoke with Dr. Theda Sers who would like to have the patient admitted to the hospital by [facility].  Patient will likely have surgery by Dr. Wynelle Link on Wednesday.  I have spoken with Dr. Hal Hope from [facility] who will admit the patient to med-surg at this time with Team 8.  8:08 pm Dr. Hal Hope called me back and declined to admit the patient and stated he would consult on the patient but ortho needed to admit.  I called back and spoke with Dr. Theda Sers who acknowledged this and said it would be taken care of.   Cherylann Parr, PA-C 09/06/13 [place]hambra Forcucci, PA-C 09/06/13 2009

## 2013-09-06 NOTE — H&P (Signed)
[name] is an 63 y.o. female.   Chief Complaint: Left hip pain for 4 days HPI: HPI: Patient presents with Left hip pain. She is a Patient of Dr. Wynelle Link how did a arthroplasty on July 8th and most recently due to a periprosthetic fracture required a revision 2 weeks ago. She has been at the [place] [facility] for rehab since.This past Thursday, she rolled on her side internally rotaing her leg after using the bedpan when she felt a pop and her leg collapsed. Minimal pain at the time , but increased over the weekend. Today due to her pain and edema a x ray was ordered and found her hip was dislocated. She was then brought to the [facility] via EMS for further evaluation. LAbs and imaging obtained which confirmed the diagnosis. Denies SOB, CP, or numbness/ tingling in LLE. 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  Past Medical History  Diagnosis Date  . Complication of anesthesia     NAUSEA  . Chest pain     pt states due to esophageal problem -  primary care MD aware   . Hives     since pneumonia vaccine   . Asthma   . Depression   . Anxiety   . Fibromyalgia   . Spondylosis   . Bulging disc   . 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 Phantom limb pain     rt leg  . Leg pain, left   . Eczema     ears  . GERD (gastroesophageal reflux disease)     " alot of burping"  . Constipation, chronic   . Urgency incontinence   . History of transfusion   . Cancer     hx sarcoma rt knee with amputation  . Difficulty sleeping   . Sleep apnea     pt had recent sleep study done but does not know results - report called for and put on chart showing significant obstructive sleep apnea  . Hypertension   . Back pain 08/19/13    low back and  left leg pain due to nerve problems per pt  . PONV (postoperative nausea and vomiting)   . Peripheral neuropathy   . History of bronchitis   . History of pneumonia   . Hiatal hernia   . History of scarlet fever   . Postmenopausal   . History of  measles   . History of mumps   . History of rubella     Past Surgical History  Procedure Laterality Date  . Abdominal hysterectomy  1980'S  . Sarcoma  1973    RT KNEE (aka)  . External ear surgery  AGE 79  . 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 Orif periprosthetic fracture Left 08/21/2013    Procedure: OPEN REDUCTION INTERNAL FIXATION (ORIF) PERIPROSTHETIC FRACTURE WITH FEMORAL REVISION, LEFT FEMUR;  Surgeon: [name], MD;  Location: [facility];  Service: Orthopedics;  Laterality: Left;  . Back surgery      Family History  Problem Relation Age of Onset  . CAD Sister     CABG, stents  . CAD Father    Social History:  reports that she quit smoking about 6 years ago. Her smoking use included Cigarettes. She smoked 0.00 packs per day. She has  never used smokeless tobacco. She reports that she does not drink alcohol or use illicit drugs.  Allergies:  Allergies  Allergen Reactions  . Bactrim [Sulfamethoxazole-Tmp Ds] Shortness Of Breath  . Cefuroxime Shortness Of Breath  . Omnipaque [Iohexol] Shortness Of Breath    Patient stated that she had a reaction a few years ago to the IV dye and had trouble breathing--so for her CT Angio Chest today (07/12/13), ER MD gave her solumedrol and Benadryl before she had test and did very well with that.  ak 07/12/13  . Pneumococcal Vaccines Hives    Hives over entire body after vaccine  . Penicillins Diarrhea  . Sulfa Antibiotics Other (See Comments)    unknown    Medications Prior to Admission  Medication Sig Dispense Refill  . acetaminophen (TYLENOL) 325 MG tablet Take 2 tablets (650 mg total) by mouth every 6 (six) hours as needed for mild pain (or Fever >/= 101).  40 tablet  0  . albuterol (PROVENTIL HFA;VENTOLIN HFA) 108 (90 BASE) MCG/ACT inhaler Inhale 2 puffs into the lungs every 6  (six) hours as needed for wheezing or shortness of breath.      Marland Kitchen buPROPion (WELLBUTRIN SR) 150 MG 12 hr tablet Take 150 mg by mouth 2 (two) times daily.      . busPIRone (BUSPAR) 10 MG tablet Take 10 mg by mouth 2 (two) times daily.      . Calcium-Vitamin D (CALTRATE 600 PLUS-VIT D PO) Take 1 tablet by mouth 2 (two) times daily.      . citalopram (CELEXA) 10 MG tablet Take 10 mg by mouth every morning.      . cyclobenzaprine (FLEXERIL) 10 MG tablet Take 1 tablet (10 mg total) by mouth 2 (two) times daily.  90 tablet  0  . diphenhydrAMINE (BENADRYL) 25 MG tablet Take 1 tablet (25 mg total) by mouth every 6 (six) hours as needed for itching.  30 tablet  0  . doxepin (SINEQUAN) 10 MG capsule Take 10 mg by mouth at bedtime.      Marland Kitchen esomeprazole (NEXIUM) 20 MG capsule Take 1 capsule (20 mg total) by mouth 2 (two) times daily before a meal.  60 capsule  0  . fexofenadine (ALLEGRA) 180 MG tablet Take 180 mg by mouth daily.      . fluocinolone (SYNALAR) 0.01 % external solution Place 2 drops into both ears 2 (two) times daily as needed (itching).      Marland Kitchen HYDROmorphone (DILAUDID) 2 MG tablet Take 0.5-1 tablets (1-2 mg total) by mouth every 3 (three) hours as needed for moderate pain or severe pain.  90 tablet  0  . irbesartan (AVAPRO) 150 MG tablet Take 150 mg by mouth 2 (two) times daily.      . iron polysaccharides (NIFEREX) 150 MG capsule Take 1 capsule (150 mg total) by mouth 2 (two) times daily.  60 capsule  1  . LORazepam (ATIVAN) 0.5 MG tablet Take 1 tablet (0.5 mg total) by mouth 2 (two) times daily as needed for anxiety.  30 tablet  0  . metoCLOPramide (REGLAN) 5 MG tablet Take 1-2 tablets (5-10 mg total) by mouth every 8 (eight) hours as needed for nausea (if ondansetron (ZOFRAN) ineffective.).  40 tablet  0  . Multiple Vitamins-Iron (MULTIVITAMINS WITH IRON) TABS tablet Take 1 tablet by mouth daily.      . ondansetron (ZOFRAN) 4 MG tablet Take 1 tablet (4 mg total) by mouth every 6 (six) hours as  needed for nausea.  40 tablet  0  . oxybutynin (DITROPAN-XL) 5 MG 24 hr tablet Take 5 mg by mouth daily.      . polyethylene glycol (MIRALAX / GLYCOLAX) packet Take 17 g by mouth 2 (two) times daily.  14 each  0  . rivaroxaban (XARELTO) 10 MG TABS tablet Take 1 tablet (10 mg total) by mouth daily with breakfast. Take Xarelto for two and a half more weeks, then discontinue Xarelto. Once the patient has completed the Xarelto, they may resume the 81 mg Aspirin.  16 tablet  0  . senna (SENOKOT) 8.6 MG TABS tablet Take 1 tablet by mouth 2 (two) times daily.        Results for orders placed during the hospital encounter of 09/06/13 (from the past 48 hour(s))  CBC WITH DIFFERENTIAL     Status: Abnormal   Collection Time    09/06/13  6:44 PM      Result Value Ref Range   WBC 9.8  4.0 - 10.5 K/uL   RBC 3.60 (*) 3.87 - 5.11 MIL/uL   Hemoglobin 10.6 (*) 12.0 - 15.0 g/dL   HCT 33.0 (*) 36.0 - 46.0 %   MCV 91.7  78.0 - 100.0 fL   MCH 29.4  26.0 - 34.0 pg   MCHC 32.1  30.0 - 36.0 g/dL   RDW 14.0  11.5 - 15.5 %   Platelets 565 (*) 150 - 400 K/uL   Neutrophils Relative % 56  43 - 77 %   Neutro Abs 5.6  1.7 - 7.7 K/uL   Lymphocytes Relative 29  12 - 46 %   Lymphs Abs 2.8  0.7 - 4.0 K/uL   Monocytes Relative 11  3 - 12 %   Monocytes Absolute 1.0  0.1 - 1.0 K/uL   Eosinophils Relative 4  0 - 5 %   Eosinophils Absolute 0.4  0.0 - 0.7 K/uL   Basophils Relative 0  0 - 1 %   Basophils Absolute 0.0  0.0 - 0.1 K/uL  BASIC METABOLIC PANEL     Status: Abnormal   Collection Time    09/06/13  6:44 PM      Result Value Ref Range   Sodium 140  137 - 147 mEq/L   Potassium 4.1  3.7 - 5.3 mEq/L   Chloride 103  96 - 112 mEq/L   CO2 25  19 - 32 mEq/L   Glucose, Bld 136 (*) 70 - 99 mg/dL   BUN 15  6 - 23 mg/dL   Creatinine, Ser 0.70  0.50 - 1.10 mg/dL   Calcium 8.9  8.4 - 10.5 mg/dL   GFR calc non Af Amer >90  >90 mL/min   GFR calc Af Amer >90  >90 mL/min   Comment: (NOTE)     The eGFR has been calculated  using the CKD EPI equation.     This calculation has not been validated in all clinical situations.     eGFR's persistently <90 mL/min signify possible Chronic Kidney     Disease.   Anion gap 12  5 - 15  PROTIME-INR     Status: Abnormal   Collection Time    09/06/13  6:44 PM      Result Value Ref Range   Prothrombin Time 16.0 (*) 11.6 - 15.2 seconds   INR 1.28  0.00 - 1.49  TYPE AND SCREEN     Status: None   Collection Time    09/06/13  6:44  PM      Result Value Ref Range   ABO/RH(D) O POS     Antibody Screen NEG     Sample Expiration 09/09/2013     Dg Hip Complete Left  09/06/2013   CLINICAL DATA:  Left leg pain  EXAM: LEFT HIP - COMPLETE 2+ VIEW  COMPARISON:  None.  FINDINGS: There is a left total hip arthroplasty. There is superior dislocation of the left femoral head component relative to the acetabular component. There is no acute fracture.  There is generalized osteopenia.  IMPRESSION: Left total hip arthroplasty with superior dislocation of the left femoral head component relative to the acetabulum.   Electronically Signed   By: Kathreen Devoid   On: 09/06/2013 18:25   Dg Femur Left  09/06/2013   CLINICAL DATA:  Increasing LEFT leg pain on Thursday, post hip replacement in July, hip injury  EXAM: LEFT FEMUR - 2 VIEW  COMPARISON:  Preoperative exam 07/12/2013  FINDINGS: Osseous demineralization.  LEFT hip prosthesis identified with superior dislocation of the femoral component.  Cerclage wires and lateral plate at the proximal LEFT femur appear intact.  No periprosthetic lucency or fracture identified.  Visualized pelvis intact.  Knee joint spaces preserved.  IMPRESSION: Dislocated LEFT hip prosthesis.  Osseous demineralization.   Electronically Signed   By: Lavonia Dana M.D.   On: 09/06/2013 18:24    Review of Systems  Constitutional: Negative.   HENT: Negative.   Eyes: Negative.   Respiratory: Negative.   Cardiovascular: Negative.   Gastrointestinal: Negative.   Genitourinary:  Negative.   Musculoskeletal: Positive for joint pain.  Skin: Negative.   Neurological: Negative.   Endo/Heme/Allergies: Negative.   Psychiatric/Behavioral: Positive for depression. The patient is nervous/anxious.     Blood pressure 111/54, pulse 77, temperature 98.3 F (36.8 C), temperature source Oral, resp. rate 14, SpO2 99.00%. Physical Exam  Constitutional: She is oriented to person, place, and time. She appears well-developed.  HENT:  Head: Normocephalic.  Eyes: EOM are normal.  Neck: Normal range of motion.  Cardiovascular: Normal rate.   Respiratory: Effort normal.  GI: Soft.  Genitourinary:  Deferred  Musculoskeletal: She exhibits edema and tenderness.  Right AKA. Left hip edema and tenderness. Anterior incision and posterior incisions. Dressing C/D/I on posterior.erthyema noted and warm to the touch. Anterior WNL. RLE N/V intact. Sciatic intact. Foot is rotated slightly. Patient can plantar and dorsi flex well. Knee is benign. No Compartment syndrome indications.   Neurological: She is alert and oriented to person, place, and time.  Skin: Skin is warm and dry.  Psychiatric: Her behavior is normal.     Assessment/Plan: Left hip arthroplasty dislocation with history of periprosthetic fracture revision Admitted for bedrest and pre-op Dr. Wynelle Link to see in am Plan for revision Wednesday Hold Xarelto Regular diet SCD to LLE    STILWELL, BRYSON L 09/06/2013, 8:50 PM

## 2013-09-06 NOTE — Consult Note (Signed)
Reason for Consult: Medical management. Referring Physician: Dr. Wynelle Link.  [name] is an 63 y.o. female.  HPI: 63 year-old female with history of hypertension GERD depression and anxiety who was recently admitted for left periprosthetic hip fracture and is in rehabilitation on Thursday 4 days ago was on a bedpan when she tried to get out she felt her left hip area had a muscle pull-like feeling and since then she has been having difficulty walking. Since her walking has not improved she was brought to the ER and x-rays show left hip dislocation. Patient has been limited for further management including possible surgery. Patient denies any chest pain or shortness of breath nausea vomiting abdominal pain diarrhea fever chills or any focal deficits. Patient has had recent admission for chest pain in July and had stress tests which was negative for ischemia. Today's EKG shows normal sinus rhythm with PVCs.  Past Medical History  Diagnosis Date  . Complication of anesthesia     NAUSEA  . Chest pain     pt states due to esophageal problem -  primary care MD aware   . Hives     since pneumonia vaccine   . Asthma   . Depression   . Anxiety   . Fibromyalgia   . Spondylosis   . Bulging disc   . 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 Phantom limb pain     rt leg  . Leg pain, left   . Eczema     ears  . GERD (gastroesophageal reflux disease)     " alot of burping"  . Constipation, chronic   . Urgency incontinence   . History of transfusion   . Cancer     hx sarcoma rt knee with amputation  . Difficulty sleeping   . Sleep apnea     pt had recent sleep study done but does not know results - report called for and put on chart showing significant obstructive sleep apnea  . Hypertension   . Back pain 08/19/13    low back and  left leg pain due to nerve problems per pt  . PONV (postoperative nausea and vomiting)   . Peripheral neuropathy   . History of bronchitis   . History of  pneumonia   . Hiatal hernia   . History of scarlet fever   . Postmenopausal   . History of measles   . History of mumps   . History of rubella     Past Surgical History  Procedure Laterality Date  . Abdominal hysterectomy  1980'S  . Sarcoma  1973    RT KNEE (aka)  . External ear surgery  AGE 32  . 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 Orif periprosthetic fracture Left 08/21/2013    Procedure: OPEN REDUCTION INTERNAL FIXATION (ORIF) PERIPROSTHETIC FRACTURE WITH FEMORAL REVISION, LEFT FEMUR;  Surgeon: [name], MD;  Location: [facility];  Service: Orthopedics;  Laterality: Left;  . Back surgery      Family History  Problem Relation Age of Onset  . CAD Sister     CABG, stents  . CAD Father     Social History:  reports that she quit smoking about 6 years ago. Her smoking use included Cigarettes. She smoked 0.00 packs per day. She  has never used smokeless tobacco. She reports that she does not drink alcohol or use illicit drugs.  Allergies:  Allergies  Allergen Reactions  . Bactrim [Sulfamethoxazole-Tmp Ds] Shortness Of Breath  . Cefuroxime Shortness Of Breath  . Omnipaque [Iohexol] Shortness Of Breath    Patient stated that she had a reaction a few years ago to the IV dye and had trouble breathing--so for her CT Angio Chest today (07/12/13), ER MD gave her solumedrol and Benadryl before she had test and did very well with that.  ak 07/12/13  . Pneumococcal Vaccines Hives    Hives over entire body after vaccine  . Penicillins Diarrhea  . Sulfa Antibiotics Other (See Comments)    unknown    Medications: I have reviewed the patient's current medications.  Results for orders placed during the hospital encounter of 09/06/13 (from the past 48 hour(s))  CBC WITH DIFFERENTIAL     Status: Abnormal   Collection Time    09/06/13   6:44 PM      Result Value Ref Range   WBC 9.8  4.0 - 10.5 K/uL   RBC 3.60 (*) 3.87 - 5.11 MIL/uL   Hemoglobin 10.6 (*) 12.0 - 15.0 g/dL   HCT 33.0 (*) 36.0 - 46.0 %   MCV 91.7  78.0 - 100.0 fL   MCH 29.4  26.0 - 34.0 pg   MCHC 32.1  30.0 - 36.0 g/dL   RDW 14.0  11.5 - 15.5 %   Platelets 565 (*) 150 - 400 K/uL   Neutrophils Relative % 56  43 - 77 %   Neutro Abs 5.6  1.7 - 7.7 K/uL   Lymphocytes Relative 29  12 - 46 %   Lymphs Abs 2.8  0.7 - 4.0 K/uL   Monocytes Relative 11  3 - 12 %   Monocytes Absolute 1.0  0.1 - 1.0 K/uL   Eosinophils Relative 4  0 - 5 %   Eosinophils Absolute 0.4  0.0 - 0.7 K/uL   Basophils Relative 0  0 - 1 %   Basophils Absolute 0.0  0.0 - 0.1 K/uL  BASIC METABOLIC PANEL     Status: Abnormal   Collection Time    09/06/13  6:44 PM      Result Value Ref Range   Sodium 140  137 - 147 mEq/L   Potassium 4.1  3.7 - 5.3 mEq/L   Chloride 103  96 - 112 mEq/L   CO2 25  19 - 32 mEq/L   Glucose, Bld 136 (*) 70 - 99 mg/dL   BUN 15  6 - 23 mg/dL   Creatinine, Ser 0.70  0.50 - 1.10 mg/dL   Calcium 8.9  8.4 - 10.5 mg/dL   GFR calc non Af Amer >90  >90 mL/min   GFR calc Af Amer >90  >90 mL/min   Comment: (NOTE)     The eGFR has been calculated using the CKD EPI equation.     This calculation has not been validated in all clinical situations.     eGFR's persistently <90 mL/min signify possible Chronic Kidney     Disease.   Anion gap 12  5 - 15  PROTIME-INR     Status: Abnormal   Collection Time    09/06/13  6:44 PM      Result Value Ref Range   Prothrombin Time 16.0 (*) 11.6 - 15.2 seconds   INR 1.28  0.00 - 1.49  TYPE AND SCREEN  Status: None   Collection Time    09/06/13  6:44 PM      Result Value Ref Range   ABO/RH(D) O POS     Antibody Screen NEG     Sample Expiration 09/09/2013      Dg Hip Complete Left  09/06/2013   CLINICAL DATA:  Left leg pain  EXAM: LEFT HIP - COMPLETE 2+ VIEW  COMPARISON:  None.  FINDINGS: There is a left total hip  arthroplasty. There is superior dislocation of the left femoral head component relative to the acetabular component. There is no acute fracture.  There is generalized osteopenia.  IMPRESSION: Left total hip arthroplasty with superior dislocation of the left femoral head component relative to the acetabulum.   Electronically Signed   By: Kathreen Devoid   On: 09/06/2013 18:25   Dg Femur Left  09/06/2013   CLINICAL DATA:  Increasing LEFT leg pain on Thursday, post hip replacement in July, hip injury  EXAM: LEFT FEMUR - 2 VIEW  COMPARISON:  Preoperative exam 07/12/2013  FINDINGS: Osseous demineralization.  LEFT hip prosthesis identified with superior dislocation of the femoral component.  Cerclage wires and lateral plate at the proximal LEFT femur appear intact.  No periprosthetic lucency or fracture identified.  Visualized pelvis intact.  Knee joint spaces preserved.  IMPRESSION: Dislocated LEFT hip prosthesis.  Osseous demineralization.   Electronically Signed   By: Lavonia Dana M.D.   On: 09/06/2013 18:24    Review of Systems  Constitutional: Negative.   HENT: Negative.   Eyes: Negative.   Respiratory: Negative.   Cardiovascular: Negative.   Gastrointestinal: Negative.   Genitourinary: Negative.   Musculoskeletal:       Difficult to move left hip.  Skin: Negative.   Neurological: Negative.   Endo/Heme/Allergies: Negative.   Psychiatric/Behavioral: Negative.    Blood pressure 117/54, pulse 95, temperature 98.6 F (37 C), temperature source Oral, resp. rate 16, SpO2 97.00%. Physical Exam  Constitutional: She is oriented to person, place, and time. She appears well-developed and well-nourished. No distress.  HENT:  Head: Normocephalic and atraumatic.  Right Ear: External ear normal.  Left Ear: External ear normal.  Nose: Nose normal.  Mouth/Throat: Oropharynx is clear and moist. No oropharyngeal exudate.  Eyes: Conjunctivae are normal. Pupils are equal, round, and reactive to light. Right eye  exhibits no discharge. Left eye exhibits no discharge. No scleral icterus.  Neck: Normal range of motion. Neck supple.  Cardiovascular: Normal rate and regular rhythm.   Respiratory: Effort normal and breath sounds normal. No respiratory distress. She has no wheezes.  GI: Soft. Bowel sounds are normal. She exhibits no distension. There is no tenderness. There is no rebound.  Musculoskeletal:  Pain on moving left hip.  Neurological: She is alert and oriented to person, place, and time. No cranial nerve deficit. Coordination normal.  Skin: Skin is warm and dry. She is not diaphoretic.  Psychiatric: Her behavior is normal.    Assessment/Plan: #1. Left hip dislocation - from medical standpoint of view patient is medically stable for surgery. Further recommendations per orthopedic surgery. #2. Hypertension - continue Avapro and closely follow blood pressure trends. #3. Anemia - probably postoperative blood loss from recent surgery. Closely follow CBC. #4. Anxiety and depression - continue present medications. #5. History of asthma - presently not wheezing. Continue when necessary inhalers.  Thanks for involving [place] in patient's care. We will follow along with you.  Rise Patience 09/06/2013, 8:22 PM

## 2013-09-07 DIAGNOSIS — T84031A Mechanical loosening of internal left hip prosthetic joint, initial encounter: Secondary | ICD-10-CM

## 2013-09-07 LAB — SURGICAL PCR SCREEN
MRSA, PCR: NEGATIVE
STAPHYLOCOCCUS AUREUS: POSITIVE — AB

## 2013-09-07 MED ORDER — MUPIROCIN 2 % EX OINT
TOPICAL_OINTMENT | Freq: Two times a day (BID) | CUTANEOUS | Status: DC
  Administered 2013-09-07: 1 via NASAL
  Administered 2013-09-07 – 2013-09-08 (×3): via NASAL
  Administered 2013-09-09: 1 via NASAL
  Administered 2013-09-10 – 2013-09-12 (×5): via NASAL
  Administered 2013-09-12 – 2013-09-13 (×2): 1 via NASAL
  Administered 2013-09-13 – 2013-09-17 (×6): via NASAL
  Administered 2013-09-18: 1 via NASAL
  Filled 2013-09-07 (×2): qty 22

## 2013-09-07 MED ORDER — ESOMEPRAZOLE MAGNESIUM 20 MG PO CPDR
20.0000 mg | DELAYED_RELEASE_CAPSULE | Freq: Two times a day (BID) | ORAL | Status: DC
  Administered 2013-09-07 – 2013-09-21 (×28): 20 mg via ORAL
  Filled 2013-09-07 (×32): qty 1

## 2013-09-07 MED ORDER — VANCOMYCIN HCL IN DEXTROSE 1-5 GM/200ML-% IV SOLN
1000.0000 mg | Freq: Once | INTRAVENOUS | Status: AC
  Administered 2013-09-08: 1000 mg via INTRAVENOUS

## 2013-09-07 MED ORDER — NON FORMULARY: 20.0000 mg | Freq: Two times a day (BID) | Status: DC

## 2013-09-07 NOTE — Progress Notes (Signed)
Clinical Social Work Department BRIEF PSYCHOSOCIAL ASSESSMENT 09/07/2013  Patient:  [name], [name]     Account Number:  [id number]     [place] date:  09/06/2013  Clinical Social Worker:  Lacie Scotts  Date/Time:  09/07/2013 01:44 PM  Referred by:  Physician  Date Referred:  09/07/2013 Referred for  SNF Placement   Other Referral:   Interview type:  Patient Other interview type:    PSYCHOSOCIAL DATA Living Status:  FACILITY Admitted from facility:   Level of care:  [place] Primary support name:  [name] Primary support relationship to patient:  CHILD, ADULT Degree of support available:   supportive    CURRENT CONCERNS Current Concerns  Post-Acute Placement   Other Concerns:    SOCIAL WORK ASSESSMENT / PLAN Pt is a 63 yr old female recently d/c from [facility] for [place]. Pt was readmitted on 8/24 due to a dislocation of her hip. She is scheduled for surgery 8/26. CSW met with pt and spoke to daughter to assist with d/c planning. Pt/ family have not been pleased with the care pt has received at [facility]. A new placement will be needed and family has begun touring facilities in [place] [facility]. Family will contact CSW with their bed choice. Pt has BCBS out of [employer]. Once a facility has been chosen csw will assist with insurance authorization.   Assessment/plan status:  Psychosocial Support/Ongoing Assessment of Needs Other assessment/ plan:   Information/referral to community resources:   Insurance coverage for SNF and ambulance transport reviewed.    PATIENT'S/FAMILY'S RESPONSE TO PLAN OF CARE: Pt is upset and discouraged relating to medical issues and poor experience at SNF. Family is very support and is looking for new placement. CSW has contacted SNF  and shared pt/ family's concerns and notified them that pt would not be returning. CSW will continue to offer support / reassurance to pt / family.    Werner Lean LCSW [phone]

## 2013-09-07 NOTE — Progress Notes (Signed)
Subjective: Hospital day - 2 Patient reports pain as mild.   Patient seen in rounds with Dr. Wynelle Link. Patient is well, but has had some minor complaints of pain in the hip area, requiring pain medications Briefly discussed the events leading up to her admission yesterday concerning the hip. She has been at the [facility] for rehab following her most recent surgery.  She rolled over onto her side internally rotaing her leg after using the bedpan when her leg "flopped" over across her body. She only had mild pain at the time , but it continued to get worse over the weekend. X-rays taken on admission show the prosthesis to be dislocated and the stem to have settled down into the femur.  She was admitted for the above history.  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  Objective: Vital signs in last 24 hours: Temp:  [98 F (36.7 C)-98.6 F (37 C)] 98.2 F (36.8 C) (08/25 0653) Pulse Rate:  [76-95] 80 (08/25 0653) Resp:  [12-16] 14 (08/25 0653) BP: (99-121)/(51-61) 103/51 mmHg (08/25 0653) SpO2:  [94 %-99 %] 97 % (08/25 0653) Weight:  [66.679 kg (147 lb)] 66.679 kg (147 lb) (08/24 2053)  Intake/Output from previous day:  Intake/Output Summary (Last 24 hours) at 09/07/13 0802 Last data filed at 09/07/13 0654  Gross per 24 hour  Intake      0 ml  Output    350 ml  Net   -350 ml     Labs:  Recent Labs  09/06/13 1844  HGB 10.6*    Recent Labs  09/06/13 1844  WBC 9.8  RBC 3.60*  HCT 33.0*  PLT 565*    Recent Labs  09/06/13 1844  NA 140  K 4.1  CL 103  CO2 25  BUN 15  CREATININE 0.70  GLUCOSE 136*  CALCIUM 8.9    Recent Labs  09/06/13 1844  INR 1.28    EXAM General - Patient is Alert, Appropriate and Oriented Extremity - Neurovascular intact Sensation intact distally Dorsiflexion/Plantar flexion intact Dressing - dressing C/D/I Motor Function - intact, moving foot and toes well on exam to the left leg  Past  Medical History  Diagnosis Date  . Complication of anesthesia     NAUSEA  . Chest pain     pt states due to esophageal problem -  primary care MD aware   . Hives     since pneumonia vaccine   . Asthma   . Depression   . Anxiety   . Fibromyalgia   . Spondylosis   . Bulging disc   . 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 Phantom limb pain     rt leg  . Leg pain, left   . Eczema     ears  . GERD (gastroesophageal reflux disease)     " alot of burping"  . Constipation, chronic   . Urgency incontinence   . History of transfusion   . Cancer     hx sarcoma rt knee with amputation  . Difficulty sleeping   . Sleep apnea     pt had recent sleep study done but does not know results - report called for and put on chart showing significant obstructive sleep apnea  . Hypertension   . Back pain 08/19/13    low back and  left leg pain due to nerve problems per pt  .  PONV (postoperative nausea and vomiting)   . Peripheral neuropathy   . History of bronchitis   . History of pneumonia   . Hiatal hernia   . History of scarlet fever   . Postmenopausal   . History of measles   . History of mumps   . History of rubella     Assessment/Plan: Hospital day - 2 Active Problems:   Fibromyalgia   GERD (gastroesophageal reflux disease)   Depression   Anxiety state, unspecified   Phantom limb (syndrome)   Obstructive sleep apnea   Dislocation of hip joint prosthesis   HTN (hypertension)   Left hip pain   Femoral loosening of prosthetic left hip  Estimated body mass index is 26.88 kg/(m^2) as calculated from the following:   Height as of this encounter: 5\' 2"  (1.575 m).   Weight as of this encounter: 66.679 kg (147 lb).  Due to the loosened femoral component and dislocation of the left total hip, it was discussed with the patient to undergo revision surgery for the left hip.  Will plan of tomorrow afternoon. She may eat today and then NPO after liquid breakfast tomorrow morning.   Will type and cross blood since her admission HGB is only 10.6.  She is able to use a bedpan at this time so will wait until time of surgery to place the foley unless circumstances change.  All anticoagulation medications are held today in anticipation for surgery tomorrow afternoon.  SCD can be used to the left leg.  Arlee Muslim, PA-C Orthopaedic Surgery 09/07/2013, 8:02 AM

## 2013-09-07 NOTE — ED Provider Notes (Signed)
Medical screening examination/treatment/procedure(s) were performed by non-physician practitioner and as supervising physician I was immediately available for consultation/collaboration.   EKG Interpretation None        Debby Freiberg, MD 09/07/13 [phone]

## 2013-09-07 NOTE — Progress Notes (Signed)
[facility] PROGRESS NOTE  [name] TMH:[id number] DOB: [birth year] DOA: 09/06/2013 PCP: Shary Key, MD  Assessment/Plan: Active Problems:   Fibromyalgia -continue current pain medication, stable    GERD (gastroesophageal reflux disease) - stable on nexium    Depression/Anxiety state, unspecified - stable on wellbutrin, celexa, and buspar.    Dislocation of hip joint prosthesis - Orthopaedic surgery managing.    HTN (hypertension) - Pt currently on avapro and blood pressures well controlled.  Code Status: full Family Communication:  None at bedside Disposition Plan: Pending PT recommendations and ortho recommendations   Consultants:  Ortho  Procedures:  Pending revision planed for 09/08/13  Antibiotics:  Vancomycin  HPI/Subjective: Pt has no new complaints.   Objective: Filed Vitals:   09/07/13 0653  BP: 103/51  Pulse: 80  Temp: 98.2 F (36.8 C)  Resp: 14    Intake/Output Summary (Last 24 hours) at 09/07/13 1511 Last data filed at 09/07/13 0900  Gross per 24 hour  Intake    240 ml  Output    350 ml  Net   -110 ml   Filed Weights   09/06/13 2053  Weight: 66.679 kg (147 lb)    Exam:   General:  Patient in no acute distress, alert and awake  Cardiovascular: Pink extremities no upper extremity edema  Respiratory: No increased work of breathing, speaking in full sentences, no audible wheezes  Abdomen:  nondistended, nontender  Musculoskeletal: No cyanosis or clubbing   Data Reviewed: Basic Metabolic Panel:  Recent Labs Lab 09/06/13 1844  NA 140  K 4.1  CL 103  CO2 25  GLUCOSE 136*  BUN 15  CREATININE 0.70  CALCIUM 8.9   Liver Function Tests: No results found for this basename: AST, ALT, ALKPHOS, BILITOT, PROT, ALBUMIN,  in the last 168 hours No results found for this basename: LIPASE, AMYLASE,  in the last 168 hours No results found for this basename: AMMONIA,  in the last 168 hours CBC:  Recent Labs Lab  09/06/13 1844  WBC 9.8  NEUTROABS 5.6  HGB 10.6*  HCT 33.0*  MCV 91.7  PLT 565*   Cardiac Enzymes: No results found for this basename: CKTOTAL, CKMB, CKMBINDEX, TROPONINI,  in the last 168 hours BNP (last 3 results) No results found for this basename: PROBNP,  in the last 8760 hours CBG: No results found for this basename: GLUCAP,  in the last 168 hours  Recent Results (from the past 240 hour(s))  SURGICAL PCR SCREEN     Status: Abnormal   Collection Time    09/06/13 10:12 PM      Result Value Ref Range Status   MRSA, PCR NEGATIVE  NEGATIVE Final   Staphylococcus aureus POSITIVE (*) NEGATIVE Final   Comment:            The Xpert SA Assay (FDA     approved for NASAL specimens     in patients over 44 years of age),     is one component of     a comprehensive surveillance     program.  Test performance has     been validated by [employer] for patients greater     than or equal to 39 year old.     It is not intended     to diagnose infection nor to     guide or monitor treatment.     Studies: Dg Hip Complete Left  09/06/2013   CLINICAL DATA:  Left leg  pain  EXAM: LEFT HIP - COMPLETE 2+ VIEW  COMPARISON:  None.  FINDINGS: There is a left total hip arthroplasty. There is superior dislocation of the left femoral head component relative to the acetabular component. There is no acute fracture.  There is generalized osteopenia.  IMPRESSION: Left total hip arthroplasty with superior dislocation of the left femoral head component relative to the acetabulum.   Electronically Signed   By: Kathreen Devoid   On: 09/06/2013 18:25   Dg Femur Left  09/06/2013   CLINICAL DATA:  Increasing LEFT leg pain on Thursday, post hip replacement in July, hip injury  EXAM: LEFT FEMUR - 2 VIEW  COMPARISON:  Preoperative exam 07/12/2013  FINDINGS: Osseous demineralization.  LEFT hip prosthesis identified with superior dislocation of the femoral component.  Cerclage wires and lateral plate at the proximal  LEFT femur appear intact.  No periprosthetic lucency or fracture identified.  Visualized pelvis intact.  Knee joint spaces preserved.  IMPRESSION: Dislocated LEFT hip prosthesis.  Osseous demineralization.   Electronically Signed   By: Lavonia Dana M.D.   On: 09/06/2013 18:24    Scheduled Meds: . buPROPion  150 mg Oral BID  . busPIRone  10 mg Oral BID  . citalopram  10 mg Oral q morning - 10a  . cyclobenzaprine  10 mg Oral BID  . doxepin  10 mg Oral QHS  . esomeprazole  20 mg Oral BID  . irbesartan  150 mg Oral BID  . iron polysaccharides  150 mg Oral BID  . mupirocin ointment   Nasal BID  . oxybutynin  5 mg Oral Daily  . polyethylene glycol  17 g Oral BID  . senna  1 tablet Oral BID  . [START ON 09/08/2013] vancomycin  1,000 mg Intravenous Once   Continuous Infusions:   Time spent:> 35 minutes    Velvet Bathe  [facility] Pager [phone] If 7PM-7AM, please contact night-coverage at [email or web address], password [facility] 09/07/2013, 3:11 PM  LOS: 1 day

## 2013-09-07 NOTE — Progress Notes (Signed)
CARE MANAGEMENT NOTE 09/07/2013  Patient:  [name], [name]   Account Number:  [id number]  Date Initiated:  09/07/2013  Documentation initiated by:  Deeya Richeson  Subjective/Objective Assessment:   pt readmitted due to turning bed and dislocating newly revised hip prothesis     Action/Plan:   snf -[place]   Anticipated DC Date:  09/10/2013   Anticipated DC Plan:  [place]  In-house referral  Clinical Social Worker      DC Planning Services  CM consult      Assurance [facility] Choice  NA   Choice offered to / List presented to:  NA   DME arranged  NA      DME agency  NA     [place] arranged  NA      [place] agency  NA   Status of service:  In process, will continue to follow Medicare Important Message given?  NA - LOS <3 / Initial given by admissions (If response is "NO", the following Medicare IM given date fields will be blank) Date Medicare IM given:   Medicare IM given by:   Date Additional Medicare IM given:   Additional Medicare IM given by:    Discharge Disposition:    Per UR Regulation:  Reviewed for med. necessity/level of care/duration of stay  If discussed at [place] of Stay Meetings, dates discussed:    Comments:  Corinna Capra, CCM

## 2013-09-08 ENCOUNTER — Inpatient Hospital Stay (HOSPITAL_COMMUNITY): Payer: BC Managed Care – PPO | Admitting: Anesthesiology

## 2013-09-08 ENCOUNTER — Inpatient Hospital Stay (HOSPITAL_COMMUNITY): Payer: BC Managed Care – PPO

## 2013-09-08 ENCOUNTER — Encounter (HOSPITAL_COMMUNITY): Payer: BC Managed Care – PPO | Admitting: Anesthesiology

## 2013-09-08 ENCOUNTER — Encounter (HOSPITAL_COMMUNITY)
Admission: EM | Disposition: A | Discharge status: Skilled Nursing Facility | Payer: Self-pay | Source: Home / Self Care | Attending: Internal Medicine

## 2013-09-08 DIAGNOSIS — F411 Generalized anxiety disorder: Secondary | ICD-10-CM

## 2013-09-08 HISTORY — PX: FEMORAL REVISION: SHX5830

## 2013-09-08 LAB — BASIC METABOLIC PANEL
ANION GAP: 12 (ref 5–15)
BUN: 16 mg/dL (ref 6–23)
CHLORIDE: 102 meq/L (ref 96–112)
CO2: 26 mEq/L (ref 19–32)
Calcium: 9 mg/dL (ref 8.4–10.5)
Creatinine, Ser: 0.8 mg/dL (ref 0.50–1.10)
GFR calc non Af Amer: 77 mL/min — ABNORMAL LOW (ref >90)
GFR, EST AFRICAN AMERICAN: 90 mL/min — AB (ref >90)
Glucose, Bld: 97 mg/dL (ref 70–99)
POTASSIUM: 3.9 meq/L (ref 3.7–5.3)
Sodium: 140 mEq/L (ref 137–147)

## 2013-09-08 LAB — HEMOGLOBIN AND HEMATOCRIT, BLOOD
HCT: 32.5 % — ABNORMAL LOW (ref 36.0–46.0)
Hemoglobin: 10.9 g/dL — ABNORMAL LOW (ref 12.0–15.0)

## 2013-09-08 LAB — CBC
HCT: 34 % — ABNORMAL LOW (ref 36.0–46.0)
HEMOGLOBIN: 10.6 g/dL — AB (ref 12.0–15.0)
MCH: 29 pg (ref 26.0–34.0)
MCHC: 31.2 g/dL (ref 30.0–36.0)
MCV: 93.2 fL (ref 78.0–100.0)
Platelets: 472 10*3/uL — ABNORMAL HIGH (ref 150–400)
RBC: 3.65 MIL/uL — AB (ref 3.87–5.11)
RDW: 14.1 % (ref 11.5–15.5)
WBC: 7.9 10*3/uL (ref 4.0–10.5)

## 2013-09-08 LAB — PREPARE RBC (CROSSMATCH)

## 2013-09-08 SURGERY — FEMORAL REVISION
Anesthesia: General | Site: Hip | Laterality: Left

## 2013-09-08 MED ORDER — MIDAZOLAM HCL 5 MG/5ML IJ SOLN
INTRAMUSCULAR | Status: DC | PRN
  Administered 2013-09-08: 2 mg via INTRAVENOUS

## 2013-09-08 MED ORDER — ONDANSETRON HCL 4 MG/2ML IJ SOLN
INTRAMUSCULAR | Status: AC
  Filled 2013-09-08: qty 2

## 2013-09-08 MED ORDER — DEXAMETHASONE SODIUM PHOSPHATE 10 MG/ML IJ SOLN
INTRAMUSCULAR | Status: DC | PRN
  Administered 2013-09-08: 10 mg via INTRAVENOUS

## 2013-09-08 MED ORDER — PHENOL 1.4 % MT LIQD
1.0000 | OROMUCOSAL | Status: DC | PRN
  Filled 2013-09-08: qty 177

## 2013-09-08 MED ORDER — METOCLOPRAMIDE HCL 5 MG/ML IJ SOLN: 5.0000 mg | Freq: Three times a day (TID) | INTRAMUSCULAR | Status: DC | PRN

## 2013-09-08 MED ORDER — DIPHENHYDRAMINE HCL 12.5 MG/5ML PO ELIX
12.5000 mg | ORAL_SOLUTION | ORAL | Status: DC | PRN
  Administered 2013-09-16: 25 mg via ORAL
  Filled 2013-09-08: qty 10

## 2013-09-08 MED ORDER — BUPIVACAINE HCL (PF) 0.25 % IJ SOLN
INTRAMUSCULAR | Status: AC
  Filled 2013-09-08: qty 30

## 2013-09-08 MED ORDER — ACETAMINOPHEN 325 MG PO TABS
650.0000 mg | ORAL_TABLET | Freq: Four times a day (QID) | ORAL | Status: DC | PRN
  Administered 2013-09-09 – 2013-09-19 (×5): 650 mg via ORAL
  Filled 2013-09-08 (×5): qty 2

## 2013-09-08 MED ORDER — METHOCARBAMOL 1000 MG/10ML IJ SOLN
500.0000 mg | Freq: Four times a day (QID) | INTRAVENOUS | Status: DC | PRN
  Administered 2013-09-08: 500 mg via INTRAVENOUS
  Filled 2013-09-08: qty 5

## 2013-09-08 MED ORDER — METHOCARBAMOL 500 MG PO TABS
500.0000 mg | ORAL_TABLET | Freq: Four times a day (QID) | ORAL | Status: DC | PRN
  Administered 2013-09-09 – 2013-09-18 (×15): 500 mg via ORAL
  Filled 2013-09-08 (×16): qty 1

## 2013-09-08 MED ORDER — DEXAMETHASONE 6 MG PO TABS
10.0000 mg | ORAL_TABLET | Freq: Every day | ORAL | Status: AC
  Administered 2013-09-09: 10 mg via ORAL
  Filled 2013-09-08: qty 1

## 2013-09-08 MED ORDER — LIDOCAINE HCL (CARDIAC) 20 MG/ML IV SOLN
INTRAVENOUS | Status: DC | PRN
  Administered 2013-09-08: 50 mg via INTRAVENOUS

## 2013-09-08 MED ORDER — LACTATED RINGERS IV SOLN
INTRAVENOUS | Status: DC | PRN
  Administered 2013-09-08 (×2): via INTRAVENOUS

## 2013-09-08 MED ORDER — DEXAMETHASONE SODIUM PHOSPHATE 10 MG/ML IJ SOLN
10.0000 mg | Freq: Every day | INTRAMUSCULAR | Status: AC
Start: 2013-09-09 — End: 2013-09-09
  Filled 2013-09-08: qty 1

## 2013-09-08 MED ORDER — ROCURONIUM BROMIDE 100 MG/10ML IV SOLN
INTRAVENOUS | Status: AC
  Filled 2013-09-08: qty 1

## 2013-09-08 MED ORDER — LORAZEPAM 0.5 MG PO TABS
0.5000 mg | ORAL_TABLET | Freq: Four times a day (QID) | ORAL | Status: DC | PRN
  Administered 2013-09-13: 0.5 mg via ORAL
  Filled 2013-09-08 (×2): qty 1

## 2013-09-08 MED ORDER — RIVAROXABAN 10 MG PO TABS
10.0000 mg | ORAL_TABLET | Freq: Every day | ORAL | Status: DC
  Administered 2013-09-09 – 2013-09-13 (×5): 10 mg via ORAL
  Filled 2013-09-08 (×8): qty 1

## 2013-09-08 MED ORDER — PHENYLEPHRINE 40 MCG/ML (10ML) SYRINGE FOR IV PUSH (FOR BLOOD PRESSURE SUPPORT)
PREFILLED_SYRINGE | INTRAVENOUS | Status: AC
  Filled 2013-09-08: qty 10

## 2013-09-08 MED ORDER — FENTANYL CITRATE 0.05 MG/ML IJ SOLN
INTRAMUSCULAR | Status: AC
  Filled 2013-09-08: qty 5

## 2013-09-08 MED ORDER — HYDROMORPHONE HCL PF 1 MG/ML IJ SOLN
INTRAMUSCULAR | Status: AC
  Filled 2013-09-08: qty 1

## 2013-09-08 MED ORDER — SODIUM CHLORIDE 0.9 % IV SOLN
INTRAVENOUS | Status: DC
  Administered 2013-09-08: 09:00:00 via INTRAVENOUS
  Administered 2013-09-08: 1000 mL via INTRAVENOUS

## 2013-09-08 MED ORDER — VANCOMYCIN HCL IN DEXTROSE 1-5 GM/200ML-% IV SOLN
INTRAVENOUS | Status: AC
  Filled 2013-09-08: qty 200

## 2013-09-08 MED ORDER — PROPOFOL 10 MG/ML IV BOLUS
INTRAVENOUS | Status: DC | PRN
  Administered 2013-09-08: 120 mg via INTRAVENOUS

## 2013-09-08 MED ORDER — DEXAMETHASONE SODIUM PHOSPHATE 10 MG/ML IJ SOLN
INTRAMUSCULAR | Status: AC
  Filled 2013-09-08: qty 1

## 2013-09-08 MED ORDER — LACTATED RINGERS IV SOLN: INTRAVENOUS | Status: DC | PRN

## 2013-09-08 MED ORDER — NEOSTIGMINE METHYLSULFATE 10 MG/10ML IV SOLN
INTRAVENOUS | Status: DC | PRN
  Administered 2013-09-08: 4 mg via INTRAVENOUS

## 2013-09-08 MED ORDER — SODIUM CHLORIDE 0.9 % IV SOLN
INTRAVENOUS | Status: DC
  Administered 2013-09-08: 22:00:00 via INTRAVENOUS

## 2013-09-08 MED ORDER — MENTHOL 3 MG MT LOZG
1.0000 | LOZENGE | OROMUCOSAL | Status: DC | PRN
Start: 2013-09-08 — End: 2013-09-21
  Filled 2013-09-08: qty 9

## 2013-09-08 MED ORDER — SODIUM CHLORIDE 0.9 % IJ SOLN
INTRAMUSCULAR | Status: AC
  Filled 2013-09-08: qty 3

## 2013-09-08 MED ORDER — ONDANSETRON HCL 4 MG/2ML IJ SOLN
INTRAMUSCULAR | Status: DC | PRN
  Administered 2013-09-08: 4 mg via INTRAVENOUS

## 2013-09-08 MED ORDER — 0.9 % SODIUM CHLORIDE (POUR BTL) OPTIME
TOPICAL | Status: DC | PRN
  Administered 2013-09-08: 1000 mL

## 2013-09-08 MED ORDER — FENTANYL CITRATE 0.05 MG/ML IJ SOLN
INTRAMUSCULAR | Status: DC | PRN
  Administered 2013-09-08: 50 ug via INTRAVENOUS
  Administered 2013-09-08: 100 ug via INTRAVENOUS
  Administered 2013-09-08: 25 ug via INTRAVENOUS

## 2013-09-08 MED ORDER — PHENYLEPHRINE HCL 10 MG/ML IJ SOLN
INTRAMUSCULAR | Status: DC | PRN
  Administered 2013-09-08: 80 ug via INTRAVENOUS
  Administered 2013-09-08 (×2): 40 ug via INTRAVENOUS

## 2013-09-08 MED ORDER — ACETAMINOPHEN 650 MG RE SUPP: 650.0000 mg | Freq: Four times a day (QID) | RECTAL | Status: DC | PRN

## 2013-09-08 MED ORDER — PROPOFOL 10 MG/ML IV BOLUS
INTRAVENOUS | Status: AC
  Filled 2013-09-08: qty 20

## 2013-09-08 MED ORDER — ONDANSETRON HCL 4 MG PO TABS
4.0000 mg | ORAL_TABLET | Freq: Four times a day (QID) | ORAL | Status: DC | PRN
  Administered 2013-09-11: 4 mg via ORAL
  Filled 2013-09-08 (×2): qty 1

## 2013-09-08 MED ORDER — LORATADINE 10 MG PO TABS
10.0000 mg | ORAL_TABLET | Freq: Every day | ORAL | Status: DC
  Administered 2013-09-09 – 2013-09-21 (×13): 10 mg via ORAL
  Filled 2013-09-08 (×14): qty 1

## 2013-09-08 MED ORDER — LACTATED RINGERS IV SOLN: INTRAVENOUS | Status: DC

## 2013-09-08 MED ORDER — ONDANSETRON HCL 4 MG/2ML IJ SOLN: 4.0000 mg | Freq: Four times a day (QID) | INTRAMUSCULAR | Status: DC | PRN

## 2013-09-08 MED ORDER — SODIUM CHLORIDE 0.9 % IR SOLN
Status: DC | PRN
  Administered 2013-09-08: 3000 mL

## 2013-09-08 MED ORDER — HYDROMORPHONE HCL PF 1 MG/ML IJ SOLN
0.5000 mg | INTRAMUSCULAR | Status: DC | PRN
  Administered 2013-09-08: 0.5 mg via INTRAVENOUS
  Filled 2013-09-08: qty 1

## 2013-09-08 MED ORDER — VANCOMYCIN HCL IN DEXTROSE 1-5 GM/200ML-% IV SOLN
1000.0000 mg | Freq: Two times a day (BID) | INTRAVENOUS | Status: AC
  Administered 2013-09-09: 1000 mg via INTRAVENOUS
  Filled 2013-09-08: qty 200

## 2013-09-08 MED ORDER — SODIUM CHLORIDE 0.9 % IV SOLN: INTRAVENOUS | Status: DC

## 2013-09-08 MED ORDER — MIDAZOLAM HCL 2 MG/2ML IJ SOLN
INTRAMUSCULAR | Status: AC
  Filled 2013-09-08: qty 2

## 2013-09-08 MED ORDER — PROMETHAZINE HCL 25 MG/ML IJ SOLN: 6.2500 mg | INTRAMUSCULAR | Status: DC | PRN

## 2013-09-08 MED ORDER — METOCLOPRAMIDE HCL 10 MG PO TABS: 5.0000 mg | ORAL_TABLET | Freq: Three times a day (TID) | ORAL | Status: DC | PRN

## 2013-09-08 MED ORDER — SODIUM CHLORIDE 0.9 % IJ SOLN
INTRAMUSCULAR | Status: AC
  Filled 2013-09-08: qty 50

## 2013-09-08 MED ORDER — VANCOMYCIN HCL IN DEXTROSE 1-5 GM/200ML-% IV SOLN
1000.0000 mg | INTRAVENOUS | Status: DC
  Filled 2013-09-08: qty 200

## 2013-09-08 MED ORDER — HYDROMORPHONE HCL PF 1 MG/ML IJ SOLN
0.2500 mg | INTRAMUSCULAR | Status: DC | PRN
  Administered 2013-09-08 (×2): 0.5 mg via INTRAVENOUS

## 2013-09-08 MED ORDER — DOCUSATE SODIUM 100 MG PO CAPS
100.0000 mg | ORAL_CAPSULE | Freq: Two times a day (BID) | ORAL | Status: DC
  Administered 2013-09-08 – 2013-09-21 (×24): 100 mg via ORAL

## 2013-09-08 MED ORDER — GLYCOPYRROLATE 0.2 MG/ML IJ SOLN
INTRAMUSCULAR | Status: DC | PRN
  Administered 2013-09-08: .6 mg via INTRAVENOUS

## 2013-09-08 MED ORDER — ROCURONIUM BROMIDE 100 MG/10ML IV SOLN
INTRAVENOUS | Status: DC | PRN
  Administered 2013-09-08: 40 mg via INTRAVENOUS

## 2013-09-08 MED ORDER — SODIUM CHLORIDE 0.9 % IV SOLN
INTRAVENOUS | Status: DC | PRN
  Administered 2013-09-08: 19:00:00 via INTRAVENOUS

## 2013-09-08 MED ORDER — LIDOCAINE HCL (CARDIAC) 20 MG/ML IV SOLN
INTRAVENOUS | Status: AC
  Filled 2013-09-08: qty 5

## 2013-09-08 SURGICAL SUPPLY — 61 items
BAG ZIPLOCK 12X15 (MISCELLANEOUS) ×3 IMPLANT
BANDAGE ELASTIC 6 VELCRO ST LF (GAUZE/BANDAGES/DRESSINGS) ×3 IMPLANT
BANDAGE ESMARK 6X9 LF (GAUZE/BANDAGES/DRESSINGS) ×1 IMPLANT
BLADE EXTENDED COATED 6.5IN (ELECTRODE) ×3 IMPLANT
BLADE SAG 18X100X1.27 (BLADE) IMPLANT
BLADE SAW SGTL 11.0X1.19X90.0M (BLADE) IMPLANT
BNDG ESMARK 6X9 LF (GAUZE/BANDAGES/DRESSINGS) ×3
CLAW TROCHANTERIC GREATER (Orthopedic Implant) ×3 IMPLANT
CUFF TOURN SGL QUICK 34 (TOURNIQUET CUFF)
CUFF TRNQT CYL 34X4X40X1 (TOURNIQUET CUFF) IMPLANT
DRAPE EXTREMITY T 121X128X90 (DRAPE) ×3 IMPLANT
DRAPE INCISE 23X17 IOBAN STRL (DRAPES) ×4
DRAPE INCISE IOBAN 23X17 STRL (DRAPES) ×2 IMPLANT
DRAPE ORTHO SPLIT 77X108 STRL (DRAPES) ×2
DRAPE POUCH INSTRU U-SHP 10X18 (DRAPES) ×3 IMPLANT
DRAPE SURG ORHT 6 SPLT 77X108 (DRAPES) ×1 IMPLANT
DRAPE U-SHAPE 47X51 STRL (DRAPES) ×3 IMPLANT
DRSG ADAPTIC 3X8 NADH LF (GAUZE/BANDAGES/DRESSINGS) ×3 IMPLANT
DRSG MEPILEX BORDER 4X12 (GAUZE/BANDAGES/DRESSINGS) ×3 IMPLANT
DRSG MEPILEX BORDER 4X4 (GAUZE/BANDAGES/DRESSINGS) ×3 IMPLANT
DRSG PAD ABDOMINAL 8X10 ST (GAUZE/BANDAGES/DRESSINGS) IMPLANT
DURAPREP 26ML APPLICATOR (WOUND CARE) IMPLANT
ELECT REM PT RETURN 9FT ADLT (ELECTROSURGICAL) ×3
ELECTRODE REM PT RTRN 9FT ADLT (ELECTROSURGICAL) ×1 IMPLANT
EVACUATOR 1/8 PVC DRAIN (DRAIN) ×3 IMPLANT
FACESHIELD WRAPAROUND (MASK) ×15 IMPLANT
GAUZE SPONGE 4X4 12PLY STRL (GAUZE/BANDAGES/DRESSINGS) ×3 IMPLANT
GLOVE BIO SURGEON STRL SZ7.5 (GLOVE) ×3 IMPLANT
GLOVE BIO SURGEON STRL SZ8 (GLOVE) ×3 IMPLANT
GLOVE BIOGEL PI IND STRL 8 (GLOVE) ×3 IMPLANT
GLOVE BIOGEL PI INDICATOR 8 (GLOVE) ×6
GOWN STRL REUS W/TWL LRG LVL3 (GOWN DISPOSABLE) ×3 IMPLANT
GOWN STRL REUS W/TWL XL LVL3 (GOWN DISPOSABLE) ×3 IMPLANT
HANDPIECE INTERPULSE COAX TIP (DISPOSABLE) ×2
HEAD CERAMIC DELTA 28 P1.5 HIP (Head) ×3 IMPLANT
IMMOBILIZER KNEE 20 (SOFTGOODS) ×3
IMMOBILIZER KNEE 20 THIGH 36 (SOFTGOODS) ×1 IMPLANT
KIT BASIN OR (CUSTOM PROCEDURE TRAY) ×3 IMPLANT
MANIFOLD NEPTUNE II (INSTRUMENTS) ×3 IMPLANT
NS IRRIG 1000ML POUR BTL (IV SOLUTION) ×3 IMPLANT
PACK TOTAL JOINT (CUSTOM PROCEDURE TRAY) ×3 IMPLANT
PADDING CAST COTTON 6X4 STRL (CAST SUPPLIES) ×3 IMPLANT
POSITIONER SURGICAL ARM (MISCELLANEOUS) ×3 IMPLANT
SET HNDPC FAN SPRY TIP SCT (DISPOSABLE) ×1 IMPLANT
SPONGE LAP 18X18 X RAY DECT (DISPOSABLE) ×6 IMPLANT
STAPLER VISISTAT 35W (STAPLE) IMPLANT
STEM FEM CMNTLSS SM AML 13.5 (Hips) ×3 IMPLANT
SUCTION FRAZIER 12FR DISP (SUCTIONS) ×3 IMPLANT
SUT DVC VLOC 180 2-0 12IN GS21 (SUTURE) ×6
SUT PDS AB 1 CT1 27 (SUTURE) ×6 IMPLANT
SUT VIC AB 2-0 CT1 27 (SUTURE) ×6
SUT VIC AB 2-0 CT1 TAPERPNT 27 (SUTURE) ×3 IMPLANT
SUTURE DVC VL 180 2-0 12INGS21 (SUTURE) ×2 IMPLANT
SWAB COLLECTION DEVICE MRSA (MISCELLANEOUS) IMPLANT
TAPE STRIPS DRAPE STRL (GAUZE/BANDAGES/DRESSINGS) ×3 IMPLANT
TOWEL OR 17X26 10 PK STRL BLUE (TOWEL DISPOSABLE) ×3 IMPLANT
TOWER CARTRIDGE SMART MIX (DISPOSABLE) IMPLANT
TRAY FOLEY CATH 14FRSI W/METER (CATHETERS) ×3 IMPLANT
TUBE ANAEROBIC SPECIMEN COL (MISCELLANEOUS) ×3 IMPLANT
WATER STERILE IRR 1500ML POUR (IV SOLUTION) ×3 IMPLANT
WRAP KNEE MAXI GEL POST OP (GAUZE/BANDAGES/DRESSINGS) IMPLANT

## 2013-09-08 NOTE — Progress Notes (Signed)
CSW assisting with d/c planning. Pt/ family have chosen [facility] for [employer]. CSW has spoken to Admissions to confirm plan. Clinicals have been provided. Bed offer is pending BCBS [place] prior authorization. CSW will assist with authorization process.  Werner Lean LCSW [phone]

## 2013-09-08 NOTE — Anesthesia Preprocedure Evaluation (Addendum)
Anesthesia Evaluation  Patient identified by MRN, date of birth, ID band Patient awake    Reviewed: Allergy & Precautions, H&P , NPO status , Patient's Chart, lab work & pertinent test results  History of Anesthesia Complications (+) PONV  Airway Mallampati: III TM Distance: >3 FB Neck ROM: Full    Dental  (+) Teeth Intact, Dental Advisory Given   Pulmonary asthma , sleep apnea , former smoker,  breath sounds clear to auscultation        Cardiovascular hypertension, Rhythm:Regular Rate:Normal     Neuro/Psych PSYCHIATRIC DISORDERS Anxiety Depression  Neuromuscular disease    GI/Hepatic Neg liver ROS, hiatal hernia, GERD-  ,  Endo/Other  negative endocrine ROS  Renal/GU negative Renal ROS     Musculoskeletal  (+) Fibromyalgia -  Abdominal   Peds  Hematology  (+) anemia ,   Anesthesia Other Findings   Reproductive/Obstetrics negative OB ROS                          Anesthesia Physical Anesthesia Plan  ASA: III  Anesthesia Plan: General   Post-op Pain Management:    Induction: Intravenous  Airway Management Planned: Oral ETT  Additional Equipment: None  Intra-op Plan:   Post-operative Plan: Extubation in OR  Informed Consent: I have reviewed the patients History and Physical, chart, labs and discussed the procedure including the risks, benefits and alternatives for the proposed anesthesia with the patient or authorized representative who has indicated his/her understanding and acceptance.   Dental advisory given  Plan Discussed with: CRNA and Surgeon  Anesthesia Plan Comments:        Anesthesia Quick Evaluation

## 2013-09-08 NOTE — Progress Notes (Signed)
[facility] PROGRESS NOTE  [name] MWU:[id number] DOB: 09-18-1950 DOA: 09/06/2013 PCP: Shary Key, MD  Assessment/Plan   Left leg hip prosthesis dislocation -  Plan for OR later today  HTN (hypertension), blood pressure low normal -  IVF -  D/c irbesartan  Fibromyalgia, continue prn dilaudid and celexa  GERD (gastroesophageal reflux disease), stable, continue nexium   Depression/Anxiety state, feeling anxious, continue wellbutrin, celexa, and buspar, and increase frequency of prn ativan  Pruritis with history of hives being managed by dermatologist -  Start loratadine -  Continue prn benadryl -  Resume doxepin  Normocytic anemia, likely iron deficiency and chronic disease, hgb stable.  Continue iron supplements  Thrombocytosis, reactive from femur fracture and trending down  Diet:  NPO at 9am Access:  PIV IVF:  yes Proph:  SCD not in place.  Holding xarelto which was being used for post-op DVT proph  Code Status: full  Family Communication: None at bedside  Disposition Plan: Pending PT recommendations and ortho recommendations    Consultants:  Ortho Procedures:  Pending revision planed for 09/08/13 Antibiotics:  Vancomycin   HPI/Subjective:  Feeling anxious, denies CP, SOB, nausea.  Had BM yesterday.  Feels a little more itchy and asking for doxepin to be restarted.  Burning numbness of big toe  Objective: Filed Vitals:   09/06/13 2305 09/07/13 0653 09/07/13 1500 09/07/13 2048  BP: 99/54 103/51 103/51 114/58  Pulse: 76 80 71 79  Temp: 98 F (36.7 C) 98.2 F (36.8 C) 98.6 F (37 C) 97.9 F (36.6 C)  TempSrc: Oral Oral Oral Oral  Resp: 12 14 16 16   Height:      Weight:      SpO2: 94% 97% 100% 99%    Intake/Output Summary (Last 24 hours) at 09/08/13 0749 Last data filed at 09/07/13 2259  Gross per 24 hour  Intake    960 ml  Output   1250 ml  Net   -290 ml   Filed Weights   09/06/13 2053  Weight: 66.679 kg (147 lb)     Exam:   General:  WF, No acute distress  HEENT:  NCAT, MMM  Cardiovascular:  RRR, nl S1, S2 no mrg, 2+ pulses, warm extremities  Respiratory:  CTAB, no increased WOB  Abdomen:   NABS, soft, NT/ND  MSK:   Normal tone and bulk upper extremities.  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  Left leg without swelling.  Incision c/d/i with steristrips no evidence of infection.    Neuro:  Grossly intact  Data Reviewed: Basic Metabolic Panel:  Recent Labs Lab 09/06/13 1844 09/08/13 0457  NA 140 140  K 4.1 3.9  CL 103 102  CO2 25 26  GLUCOSE 136* 97  BUN 15 16  CREATININE 0.70 0.80  CALCIUM 8.9 9.0   Liver Function Tests: No results found for this basename: AST, ALT, ALKPHOS, BILITOT, PROT, ALBUMIN,  in the last 168 hours No results found for this basename: LIPASE, AMYLASE,  in the last 168 hours No results found for this basename: AMMONIA,  in the last 168 hours CBC:  Recent Labs Lab 09/06/13 1844 09/08/13 0457  WBC 9.8 7.9  NEUTROABS 5.6  --   HGB 10.6* 10.6*  HCT 33.0* 34.0*  MCV 91.7 93.2  PLT 565* 472*   Cardiac Enzymes: No results found for this basename: CKTOTAL, CKMB, CKMBINDEX, TROPONINI,  in the last 168 hours BNP (last 3 results) No results found for this basename: PROBNP,  in the  last 8760 hours CBG: No results found for this basename: GLUCAP,  in the last 168 hours  Recent Results (from the past 240 hour(s))  SURGICAL PCR SCREEN     Status: Abnormal   Collection Time    09/06/13 10:12 PM      Result Value Ref Range Status   MRSA, PCR NEGATIVE  NEGATIVE Final   Staphylococcus aureus POSITIVE (*) NEGATIVE Final   Comment:            The Xpert SA Assay (FDA     approved for NASAL specimens     in patients over 56 years of age),     is one component of     a comprehensive surveillance     program.  Test performance has     been validated by [employer] for patients greater     than or equal to 52 year old.     It is not intended      to diagnose infection nor to     guide or monitor treatment.     Studies: Dg Hip Complete Left  09/06/2013   CLINICAL DATA:  Left leg pain  EXAM: LEFT HIP - COMPLETE 2+ VIEW  COMPARISON:  None.  FINDINGS: There is a left total hip arthroplasty. There is superior dislocation of the left femoral head component relative to the acetabular component. There is no acute fracture.  There is generalized osteopenia.  IMPRESSION: Left total hip arthroplasty with superior dislocation of the left femoral head component relative to the acetabulum.   Electronically Signed   By: Kathreen Devoid   On: 09/06/2013 18:25   Dg Femur Left  09/06/2013   CLINICAL DATA:  Increasing LEFT leg pain on Thursday, post hip replacement in July, hip injury  EXAM: LEFT FEMUR - 2 VIEW  COMPARISON:  Preoperative exam 07/12/2013  FINDINGS: Osseous demineralization.  LEFT hip prosthesis identified with superior dislocation of the femoral component.  Cerclage wires and lateral plate at the proximal LEFT femur appear intact.  No periprosthetic lucency or fracture identified.  Visualized pelvis intact.  Knee joint spaces preserved.  IMPRESSION: Dislocated LEFT hip prosthesis.  Osseous demineralization.   Electronically Signed   By: Lavonia Dana M.D.   On: 09/06/2013 18:24    Scheduled Meds: . buPROPion  150 mg Oral BID  . busPIRone  10 mg Oral BID  . citalopram  10 mg Oral q morning - 10a  . cyclobenzaprine  10 mg Oral BID  . doxepin  10 mg Oral QHS  . esomeprazole  20 mg Oral BID  . irbesartan  150 mg Oral BID  . iron polysaccharides  150 mg Oral BID  . mupirocin ointment   Nasal BID  . oxybutynin  5 mg Oral Daily  . polyethylene glycol  17 g Oral BID  . senna  1 tablet Oral BID  . vancomycin  1,000 mg Intravenous Once  . vancomycin  1,000 mg Intravenous On Call to OR   Continuous Infusions:   Active Problems:   Fibromyalgia   GERD (gastroesophageal reflux disease)   Depression   Anxiety state, unspecified   Phantom limb  (syndrome)   Obstructive sleep apnea   Dislocation of hip joint prosthesis   HTN (hypertension)   Left hip pain   Femoral loosening of prosthetic left hip    Time spent: 30 min    Mackenzee Becvar, [facility] Pager [phone]. If 7PM-7AM, please contact night-coverage at  [email or web address], password [facility] 09/08/2013, 7:49 AM  LOS: 2 days

## 2013-09-08 NOTE — Interval H&P Note (Signed)
History and Physical Interval Note:  09/08/2013 5:51 PM  [name]  has presented today for surgery, with the diagnosis of S/P LEFT TOTAL HIP ARTHROPLASTY   The various methods of treatment have been discussed with the patient and family. After consideration of risks, benefits and other options for treatment, the patient has consented to  Procedure(s): LEFT FEMORAL REVISION (Left) as a surgical intervention .  The patient's history has been reviewed, patient examined, no change in status, stable for surgery.  I have reviewed the patient's chart and labs.  Questions were answered to the patient's satisfaction.     [name]

## 2013-09-08 NOTE — Brief Op Note (Signed)
09/06/2013 - 09/08/2013  7:52 PM  PATIENT:  [name]  63 y.o. female  PRE-OPERATIVE DIAGNOSIS:  Left femoral loosening with hip dislocation  POST-OPERATIVE DIAGNOSIS: Left femoral loosening with hip dislocation     PROCEDURE:  Procedure(s): LEFT FEMORAL REVISION (Left)  SURGEON:  Surgeon(s) and Role:    [name], MD - Primary  PHYSICIAN ASSISTANT:   ASSISTANTS: Arlee Muslim, PA-C   ANESTHESIA:   general  EBL:  Total I/O In: 72 [Blood:335] Out: 400 [Blood:400]  DRAINS: (Medium) Hemovact drain(s) in the left hip with  Suction Open   LOCAL MEDICATIONS USED:  NONE  COUNTS:  YES  TOURNIQUET:  * No tourniquets in log *  DICTATION: .Other Dictation: Dictation Number [id number]  PLAN OF CARE: Admit to inpatient   PATIENT DISPOSITION:  PACU - hemodynamically stable.

## 2013-09-08 NOTE — Anesthesia Postprocedure Evaluation (Signed)
  Anesthesia Post-op Note  Patient: [name]  Procedure(s) Performed: Procedure(s): LEFT FEMORAL REVISION (Left)  Patient Location: PACU  Anesthesia Type:General  Level of Consciousness: awake, alert  and oriented  Airway and Oxygen Therapy: Patient Spontanous Breathing  Post-op Pain: mild  Post-op Assessment: Post-op Vital signs reviewed. MAP stable at 76mmHg. HDS for transfer to floor.  Post-op Vital Signs: Reviewed  Last Vitals:  Filed Vitals:   09/08/13 2100  BP: 90/49  Pulse: 106  Temp: 36.4 C  Resp: 11    Complications: No apparent anesthesia complications

## 2013-09-08 NOTE — Transfer of Care (Signed)
Immediate Anesthesia Transfer of Care Note  Patient: [name]  Procedure(s) Performed: Procedure(s) (LRB): LEFT FEMORAL REVISION (Left)  Patient Location: PACU  Anesthesia Type: General  Level of Consciousness: sedated, patient cooperative and responds to stimulation  Airway & Oxygen Therapy: Patient Spontanous Breathing and Patient connected to face mask oxgen  Post-op Assessment: Report given to PACU RN and Post -op Vital signs reviewed and stable  Post vital signs: Reviewed and stable  Complications: No apparent anesthesia complications

## 2013-09-09 ENCOUNTER — Encounter (HOSPITAL_COMMUNITY): Payer: Self-pay | Admitting: Orthopedic Surgery

## 2013-09-09 DIAGNOSIS — F329 Major depressive disorder, single episode, unspecified: Secondary | ICD-10-CM

## 2013-09-09 DIAGNOSIS — F3289 Other specified depressive episodes: Secondary | ICD-10-CM

## 2013-09-09 LAB — BASIC METABOLIC PANEL
Anion gap: 10 (ref 5–15)
BUN: 14 mg/dL (ref 6–23)
CO2: 22 mEq/L (ref 19–32)
CREATININE: 0.75 mg/dL (ref 0.50–1.10)
Calcium: 8 mg/dL — ABNORMAL LOW (ref 8.4–10.5)
Chloride: 101 mEq/L (ref 96–112)
GFR, EST NON AFRICAN AMERICAN: 89 mL/min — AB (ref >90)
GLUCOSE: 193 mg/dL — AB (ref 70–99)
Potassium: 4.9 mEq/L (ref 3.7–5.3)
Sodium: 133 mEq/L — ABNORMAL LOW (ref 137–147)

## 2013-09-09 LAB — CBC
HEMATOCRIT: 30.9 % — AB (ref 36.0–46.0)
HEMOGLOBIN: 10.2 g/dL — AB (ref 12.0–15.0)
MCH: 28.8 pg (ref 26.0–34.0)
MCHC: 33 g/dL (ref 30.0–36.0)
MCV: 87.3 fL (ref 78.0–100.0)
Platelets: 344 10*3/uL (ref 150–400)
RBC: 3.54 MIL/uL — ABNORMAL LOW (ref 3.87–5.11)
RDW: 15.6 % — AB (ref 11.5–15.5)
WBC: 14.9 10*3/uL — ABNORMAL HIGH (ref 4.0–10.5)

## 2013-09-09 MED ORDER — SODIUM CHLORIDE 0.9 % IV BOLUS (SEPSIS)
500.0000 mL | Freq: Once | INTRAVENOUS | Status: AC
  Administered 2013-09-09: 500 mL via INTRAVENOUS

## 2013-09-09 MED ORDER — NON FORMULARY: 20.0000 mg | Freq: Two times a day (BID) | Status: DC

## 2013-09-09 NOTE — Evaluation (Signed)
Occupational Therapy Evaluation Patient Details Name: [name] MRN: [id number] DOB: [birth year] Today's Date: 09/09/2013    History of Present Illness LEFT FEMORAL REVISION -Left.. Pt with history of RAKA.     Clinical Impression   Pt presents to OT with decreased I with ADL activity due to problems listed below.  Pt will benefit from skilled OT to increase I with ADL activity and return to PLOF.    Follow Up Recommendations  SNF    Equipment Recommendations  None recommended by OT       Precautions / Restrictions Precautions Precautions: Anterior Hip;Fall Restrictions LLE Weight Bearing: Weight bearing as tolerated      Mobility Bed Mobility Overal bed mobility: Needs Assistance Bed Mobility: Supine to Sit     Supine to sit: Max assist;HOB elevated Sit to supine: Mod assist;HOB elevated   General bed mobility comments: assist for L LE and use of pad to position hips  Transfers      did not perform transfer                Balance          Pt able to sit EOB brushing hair for approx 10 minutes                                  ADL                Pt able to sit EOB and perform grooming task with close S.  Initially pt holding on with 2 hands but was then able to take meds and brush hair with no UE support.    Pt very motivated but did not want to transfer as pt did not have shoe she needed.  Pt will need  SNF to regain I prior to return ing home                                           Pertinent Vitals/Pain Pain Location: L hip- doesnt hurt unless it bends              Cognition Arousal/Alertness: Awake/alert Behavior During Therapy: WFL for tasks assessed/performed Overall Cognitive Status: Within Functional Limits for tasks assessed                     General Comments   Pt very motivated            Home Living Family/patient expects to be discharged to:: Skilled nursing facility                                             OT Diagnosis: Generalized weakness   OT Problem List: Decreased strength;Decreased activity tolerance;Pain   OT Treatment/Interventions: Self-care/ADL training;DME and/or AE instruction;Patient/family education;Therapeutic activities    OT Goals(Current goals can be found in the care plan section) Acute Rehab OT Goals Patient Stated Goal: get back home OT Goal Formulation: With patient Time For Goal Achievement: 09/16/13 Potential to Achieve Goals: Good ADL Goals Pt Will Perform Grooming: with set-up;sitting Pt Will Perform Lower Body Dressing: with supervision;sitting/lateral leans;sit to/from stand;with adaptive equipment Pt Will Transfer to Toilet: with min guard assist;bedside commode Pt Will  Perform Toileting - Clothing Manipulation and hygiene: with min guard assist;sitting/lateral leans  OT Frequency: Min 2X/week              End of Session Nurse Communication: Mobility status  Activity Tolerance: Patient tolerated treatment well Patient left: in bed;with call bell/phone within reach   Time: 1157-1216 OT Time Calculation (min): 19 min Charges:  OT General Charges $OT Visit: 1 Procedure OT Evaluation $Initial OT Evaluation Tier I: 1 Procedure OT Treatments $Self Care/Home Management : 8-22 mins G-Codes:    Betsy Pries 10-08-2013, 12:37 PM

## 2013-09-09 NOTE — Progress Notes (Addendum)
[facility] PROGRESS NOTE  [name] JYN:[id number] DOB: 1950/08/31 DOA: 09/06/2013 PCP: Shary Key, MD  Assessment/Plan  Left leg hip prosthesis dislocation s/p left hip prosthesis revision on 8/26 by Dr. Wynelle Link -  DVT proph and weight bearing status per orthopedics  Acute hypoxic respiratory failure likely due to atelectasis, routine use post-operative -  Wean oxygen as tolerated -  IS  HTN (hypertension), blood pressure still low normal -  Continue IVF -  Continue to hold irbesartan  Fibromyalgia, continue prn dilaudid and celexa  GERD (gastroesophageal reflux disease), stable, continue nexium   Depression/Anxiety state, feeling better post surgery, continue wellbutrin, celexa, and buspar, and continue increased frequency of prn ativan -  Needs close follow up with primary psychiatrist -  Would not consult our inpatient psychiatrist unless acute issue evolves  Pruritis with history of hives being managed by dermatologist -  Continue loratadine, prn benadryl, and doxepin  Hyponatremia, may be due to dehydration post large surgery, mild and asymptomatic -  Repeat in AM -  IVF  Leukocytosis, likely reactive from surgery -  Monitor for signs of infection, currently afebrile -  D/c foley ASAP  Normocytic anemia, likely iron deficiency and chronic disease, hgb stable.  Continue iron supplements.  Transfused 2 units PRBC intraoperatively on 8/26. -  hgb stable  Thrombocytosis, reactive from femur fracture and trending down  Diet:  regular Access:  PIV IVF:  yes Proph: xarelto  Code Status: full  Family Communication: None at bedside  Disposition Plan: Pending PT recommendations and ortho recommendations    Consultants:  Ortho. Dr. Wynelle Link Procedures:  Pending revision planed for 09/08/13 Antibiotics:  Perioperative Vancomycin   HPI/Subjective:  Feels well.  Some pain in the left leg, but otherwise feeling well this AM.    Objective: Filed  Vitals:   09/08/13 2351 09/09/13 0000 09/09/13 0130 09/09/13 0609  BP: 77/54  106/54 113/59  Pulse: 117  103 91  Temp: 98.6 F (37 C)  98.1 F (36.7 C) 98.1 F (36.7 C)  TempSrc: Oral  Oral Oral  Resp: 12 12 12 12   Height:      Weight:      SpO2: 96% 98% 98% 98%    Intake/Output Summary (Last 24 hours) at 09/09/13 0735 Last data filed at 09/09/13 0616  Gross per 24 hour  Intake   5835 ml  Output   3225 ml  Net   2610 ml   Filed Weights   09/06/13 2053  Weight: 66.679 kg (147 lb)    Exam:   General:  WF, No acute distress  HEENT:  NCAT, MMM  Cardiovascular:  RRR, nl S1, S2 no mrg, 2+ pulses, warm extremities  Respiratory:  CTAB, no increased WOB  Abdomen:   NABS, soft, NT/ND  MSK:   Normal tone and bulk upper extremities.  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  Left leg without swelling.  Dressing c/d/i without bleeding, hematoma, or ecchymosis   Neuro:  Grossly intact  Data Reviewed: Basic Metabolic Panel:  Recent Labs Lab 09/06/13 1844 09/08/13 0457 09/09/13 0529  NA 140 140 133*  K 4.1 3.9 4.9  CL 103 102 101  CO2 25 26 22   GLUCOSE 136* 97 193*  BUN 15 16 14   CREATININE 0.70 0.80 0.75  CALCIUM 8.9 9.0 8.0*   Liver Function Tests: No results found for this basename: AST, ALT, ALKPHOS, BILITOT, PROT, ALBUMIN,  in the last 168 hours No results found for this basename: LIPASE, AMYLASE,  in the last 168 hours No results found for this basename: AMMONIA,  in the last 168 hours CBC:  Recent Labs Lab 09/06/13 1844 09/08/13 0457 09/08/13 2113 09/09/13 0529  WBC 9.8 7.9  --  14.9*  NEUTROABS 5.6  --   --   --   HGB 10.6* 10.6* 10.9* 10.2*  HCT 33.0* 34.0* 32.5* 30.9*  MCV 91.7 93.2  --  87.3  PLT 565* 472*  --  344   Cardiac Enzymes: No results found for this basename: CKTOTAL, CKMB, CKMBINDEX, TROPONINI,  in the last 168 hours BNP (last 3 results) No results found for this basename: PROBNP,  in the last 8760 hours CBG: No results found  for this basename: GLUCAP,  in the last 168 hours  Recent Results (from the past 240 hour(s))  SURGICAL PCR SCREEN     Status: Abnormal   Collection Time    09/06/13 10:12 PM      Result Value Ref Range Status   MRSA, PCR NEGATIVE  NEGATIVE Final   Staphylococcus aureus POSITIVE (*) NEGATIVE Final   Comment:            The Xpert SA Assay (FDA     approved for NASAL specimens     in patients over 25 years of age),     is one component of     a comprehensive surveillance     program.  Test performance has     been validated by [employer] for patients greater     than or equal to 43 year old.     It is not intended     to diagnose infection nor to     guide or monitor treatment.     Studies: Dg Pelvis Portable  09/08/2013   CLINICAL DATA:  Status post left hip surgery.  EXAM: PORTABLE PELVIS 1-2 VIEWS  COMPARISON:  08/21/2013.  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 A new left total hip prosthesis is demonstrated with interval discontinuity of the upper cerclage wire. No acute fracture or dislocation seen.  IMPRESSION: Interval replacement of the left hip prosthesis with discontinuity of the upper cerclage wire.   Electronically Signed   By: Enrique Sack M.D.   On: 09/08/2013 20:31    Scheduled Meds: . buPROPion  150 mg Oral BID  . busPIRone  10 mg Oral BID  . citalopram  10 mg Oral q morning - 10a  . cyclobenzaprine  10 mg Oral BID  . dexamethasone  10 mg Oral Daily   Or  . dexamethasone  10 mg Intravenous Daily  . docusate sodium  100 mg Oral BID  . doxepin  10 mg Oral QHS  . esomeprazole  20 mg Oral BID  . HYDROmorphone      . iron polysaccharides  150 mg Oral BID  . loratadine  10 mg Oral Daily  . mupirocin ointment   Nasal BID  . oxybutynin  5 mg Oral Daily  . polyethylene glycol  17 g Oral BID  . rivaroxaban  10 mg Oral Q breakfast  . senna  1 tablet Oral BID  . sodium  chloride       Continuous Infusions: . sodium chloride 75 mL/hr at 09/08/13 2130    Active Problems:   Fibromyalgia   GERD (gastroesophageal reflux disease)   Depression   Anxiety state, unspecified   Phantom limb (  syndrome)   Obstructive sleep apnea   Dislocation of hip joint prosthesis   HTN (hypertension)   Left hip pain   Femoral loosening of prosthetic left hip    Time spent: 30 min    Ayris Carano, [facility] Pager [phone]. If 7PM-7AM, please contact night-coverage at [email or web address], password [facility] 09/09/2013, 7:35 AM  LOS: 3 days

## 2013-09-09 NOTE — Discharge Instructions (Addendum)
Dr. Gaynelle Arabian Total Joint Specialist [facility] [address], [place], [place] [address] [phone]   TOTAL HIP REPLACEMENT POSTOPERATIVE DIRECTIONS    Hip Rehabilitation, Guidelines Following Surgery  The results of a hip operation are greatly improved after range of motion and muscle strengthening exercises. Follow all safety measures which are given to protect your hip. If any of these exercises cause increased pain or swelling in your joint, decrease the amount until you are comfortable again. Then slowly increase the exercises. Call your caregiver if you have problems or questions.  HOME CARE INSTRUCTIONS  Most of the following instructions are designed to prevent the dislocation of your new hip.  Remove items at home which could result in a fall. This includes throw rugs or furniture in walking pathways.  Continue medications as instructed at time of discharge.  You may have some home medications which will be placed on hold until you complete the course of blood thinner medication.  You may start showering once you are discharged home but do not submerge the incision under water. Just pat the incision dry and apply a dry gauze dressing on daily. Do not put on socks or shoes without following the instructions of your caregivers.  Sit on high chairs so your hips are not bent more than 90 degrees.  Sit on chairs with arms. Use the chair arms to help push yourself up when arising.  Keep your leg on the side of the operation out in front of you when standing up.  Arrange for the use of a toilet seat elevator so you are not sitting low.  Do not do any exercises or get in any positions that cause your toes to point in (pigeon toed).  Always sleep with a pillow between your legs. Do not lie on your side in sleep with both knees touching the bed.   Walk with walker as instructed.  You may resume a sexual relationship in one month or when given the OK by  your caregiver.  Use walker as long as suggested by your caregivers.  You may put full weight on your legs and walk as much as is comfortable. Avoid periods of inactivity such as sitting longer than an hour when not asleep. This helps prevent blood clots.  You may return to work once you are cleared by Engineer, production.  Do not drive a car for 6 weeks or until released by your surgeon.  Do not drive while taking narcotics.  Wear elastic stockings for three weeks following surgery during the day but you may remove then at night.  Make sure you keep all of your appointments after your operation with all of your doctors and caregivers. You should call the office at the above phone number and make an appointment for approximately two weeks after the date of your surgery. Change the dressing daily and reapply a dry dressing each time. Please pick up a stool softener and laxative for home use as long as you are requiring pain medications.  Continue to use ice on the hip for pain and swelling from surgery. You may notice swelling that will progress down to the foot and ankle.  This is normal after  surgery.  Elevate the leg when you are not up walking on it.   It is important for you to complete the blood thinner medication as prescribed by your doctor.  Continue to use the breathing machine which will help keep your temperature down.  It  is common for your temperature to cycle up and down following surgery, especially at night when you are not up moving around and exerting yourself.  The breathing machine keeps your lungs expanded and your temperature down.  RANGE OF MOTION AND STRENGTHENING EXERCISES  These exercises are designed to help you keep full movement of your hip joint. Follow your caregiver's or physical therapist's instructions. Perform all exercises about fifteen times, three times per day or as directed. Exercise both hips, even if you have had only one joint replacement. These exercises can be  done on a training (exercise) mat, on the floor, on a table or on a bed. Use whatever works the best and is most comfortable for you. Use music or television while you are exercising so that the exercises are a pleasant break in your day. This will make your life better with the exercises acting as a break in routine you can look forward to.  Lying on your back, slowly slide your foot toward your buttocks, raising your knee up off the floor. Then slowly slide your foot back down until your leg is straight again.  Lying on your back spread your legs as far apart as you can without causing discomfort.  Lying on your side, raise your upper leg and foot straight up from the floor as far as is comfortable. Slowly lower the leg and repeat.  Lying on your back, tighten up the muscle in the front of your thigh (quadriceps muscles). You can do this by keeping your leg straight and trying to raise your heel off the floor. This helps strengthen the largest muscle supporting your knee.  Lying on your back, tighten up the muscles of your buttocks both with the legs straight and with the knee bent at a comfortable angle while keeping your heel on the floor.   SKILLED REHAB INSTRUCTIONS: If the patient is transferred to a skilled rehab facility following release from the hospital, a list of the current medications will be sent to the facility for the patient to continue.  When discharged from the skilled rehab facility, please have the facility set up the patient's [place] prior to being released. Also, the skilled facility will be responsible for providing the patient with their medications at time of release from the facility to include their pain medication, the muscle relaxants, and their blood thinner medication. If the patient is still at the rehab facility at time of the two week follow up appointment, the skilled rehab facility will also need to assist the patient in arranging follow up  appointment in our office and any transportation needs.  MAKE SURE YOU:  Understand these instructions.  Will watch your condition.  Will get help right away if you are not doing well or get worse.  Pick up stool softner and laxative for home. Do not submerge incision under water. May shower. Continue to use ice for pain and swelling from surgery. Hip precautions.  Total Hip Protocol.  Take a full dose 325 mg Aspirin daily for three weeks, and then switch over to a baby 81 mg Aspirin daily for three more weeks.  When discharged from the skilled rehab facility, please have the facility set up the patient's [place] prior to being released.  Also provide the patient with their medications at time of release from the facility to include their pain medication, the muscle relaxants, and their blood thinner medication.  If the patient is still at the  rehab facility at time of follow up appointment, please also assist the patient in arranging follow up appointment in our office and any transportation needs.

## 2013-09-09 NOTE — Progress Notes (Signed)
Subjective: 1 Day Post-Op Procedure(s) (LRB): LEFT FEMORAL REVISION (Left) Patient reports pain as mild.   Patient seen in rounds by Dr. Wynelle Link. Patient is well, but has had some minor complaints of pain in the hip and thigh, requiring pain medications We will start therapy today.  Plan is to go Skilled nursing facility after hospital stay.  Objective: Vital signs in last 24 hours: Temp:  [97.6 F (36.4 C)-98.6 F (37 C)] 98.1 F (36.7 C) (08/27 0609) Pulse Rate:  [79-122] 91 (08/27 0609) Resp:  [10-16] 12 (08/27 0609) BP: (77-113)/(46-82) 113/59 mmHg (08/27 0609) SpO2:  [96 %-100 %] 98 % (08/27 0609)  Intake/Output from previous day:  Intake/Output Summary (Last 24 hours) at 09/09/13 0856 Last data filed at 09/09/13 0616  Gross per 24 hour  Intake   5835 ml  Output   2575 ml  Net   3260 ml    Intake/Output this shift: UOP 600 since MN +1970  Labs:  Recent Labs  09/06/13 1844 09/08/13 0457 09/08/13 2113 09/09/13 0529  HGB 10.6* 10.6* 10.9* 10.2*    Recent Labs  09/08/13 0457 09/08/13 2113 09/09/13 0529  WBC 7.9  --  14.9*  RBC 3.65*  --  3.54*  HCT 34.0* 32.5* 30.9*  PLT 472*  --  344    Recent Labs  09/08/13 0457 09/09/13 0529  NA 140 133*  K 3.9 4.9  CL 102 101  CO2 26 22  BUN 16 14  CREATININE 0.80 0.75  GLUCOSE 97 193*  CALCIUM 9.0 8.0*    Recent Labs  09/06/13 1844  INR 1.28    EXAM General - Patient is Alert, Appropriate and Oriented Extremity - Neurovascular intact Sensation intact distally Dorsiflexion/Plantar flexion intact Dressing - dressing C/D/I Motor Function - intact, moving foot and toes well on exam.  Hemovac drain left in place  Past Medical History  Diagnosis Date  . Complication of anesthesia     NAUSEA  . Chest pain     pt states due to esophageal problem -  primary care MD aware   . Hives     since pneumonia vaccine   . Asthma   . Depression   . Anxiety   . Fibromyalgia   . Spondylosis   .  Bulging disc   . 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 Phantom limb pain     rt leg  . Leg pain, left   . Eczema     ears  . GERD (gastroesophageal reflux disease)     " alot of burping"  . Constipation, chronic   . Urgency incontinence   . History of transfusion   . Cancer     hx sarcoma rt knee with amputation  . Difficulty sleeping   . Sleep apnea     pt had recent sleep study done but does not know results - report called for and put on chart showing significant obstructive sleep apnea  . Hypertension   . Back pain 08/19/13    low back and  left leg pain due to nerve problems per pt  . PONV (postoperative nausea and vomiting)   . Peripheral neuropathy   . History of bronchitis   . History of pneumonia   . Hiatal hernia   . History of scarlet fever   . Postmenopausal   . History of measles   . History of mumps   . History of rubella  Assessment/Plan: 1 Day Post-Op Procedure(s) (LRB): LEFT FEMORAL REVISION (Left) Active Problems:   Fibromyalgia   GERD (gastroesophageal reflux disease)   Postoperative anemia due to acute blood loss   Depression   Anxiety state, unspecified   Phantom limb (syndrome)   Obstructive sleep apnea   Transfusion history   Dislocation of hip joint prosthesis   HTN (hypertension)   Left hip pain   Femoral loosening of prosthetic left hip  Estimated body mass index is 26.88 kg/(m^2) as calculated from the following:   Height as of this encounter: 5\' 2"  (1.575 m).   Weight as of this encounter: 66.679 kg (147 lb). Advance diet Up with therapy Discharge to SNF  HGB is 10.2 today after two units intraoperatively yesterday.  DVT Prophylaxis - Xarelto Weight Bearing As Tolerated left Leg D/C Knee Immobilizer Hemovac left in place for one more day Begin Therapy Hip Preacutions  Arlee Muslim, PA-C Orthopaedic Surgery 09/09/2013, 8:56 AM

## 2013-09-09 NOTE — Op Note (Signed)
[name], [name] NO.:  [id number]  MEDICAL RECORD NO.:  [id number]  LOCATION:  22                         FACILITY:  [facility]  PHYSICIAN:  Gaynelle Arabian, M.D.    DATE OF BIRTH:  November 28, 1950  DATE OF PROCEDURE:  09/08/2013 DATE OF DISCHARGE:                              OPERATIVE REPORT   PREOPERATIVE DIAGNOSIS:  Left femoral loosening with hip dislocation.  POSTOPERATIVE DIAGNOSIS:  Left femoral loosening with hip dislocation.  PROCEDURE:  Left femoral revision.  SURGEON:  Gaynelle Arabian, M.D.  ASSISTANT:  Alexzandrew L. Perkins, PA-C.  ANESTHESIA:  General.  ESTIMATED BLOOD LOSS:  1200 replaced in 2 units packed red blood cells.  DRAINS:  Hemovac x1.  COMPLICATIONS:  None.  CONDITION:  Stable to recovery.  BRIEF CLINICAL NOTE:  [name] is a 63 year old female with a complex history in regards to her left hip.  She underwent a total hip arthroplasty anterior approach almost a month ago and did well initially, but then had an episode at home where she twisted and felt a pop in the hip.  She had a proximal femur fracture and a stem subsided. We treated this with a femoral revision ORIF of the fracture 2 weeks ago.  She was in a skilled nursing facility and when she had her leg moved last week, she had increased pain.  Four days later, she was taken to [facility] Emergency Room, noted to have a hip dislocation with refracture of the proximal femur and subsidence of the stem.  She presents now for revision.  PROCEDURE IN DETAIL:  After successful administration of general anesthetic, the patient was placed in the right lateral decubitus position with the left side up and held with the hip positioner.  The left lower extremity was isolated from her perineum with plastic drapes and prepped and draped in the usual sterile fashion.  Previous posterolateral incisions were utilized.  Skin cut with 10 blade through subcutaneous tissue to the level of the  fascia lata which was incised in line with the skin incision.  The sciatic nerve was palpated and protected.  The plate was intact from the previous surgery.  One of the cables so ever had broken and we decided to remove the plate. Fortunately, the trochanteric fracture was has already started to heal. We then gained access to the femoral canal and thoroughly irrigated the canal.  We reamed the AML prosthesis with a straight reamer starting at 9 mm all the way up to 13.5 mm for 13.5 stem.  We then broached up to the 13.5.  The 13.5 small stature AML was then impacted into her femur in about 20 degrees of anteversion.  We had at the level of the lesser trochanter, with fantastic fit and great stability to both axial forces as well as any rotational forces.  We trialed a 28+ 1.5 head reduced it and this fantastic stability with full extension, full external rotation, 70 degrees flexion, 40 degrees adduction, 90 degrees internal rotation, and 90 degrees of flexion and 70 degrees of internal rotation. The hip was then dislocated and the permanent 28+ 1.5 ceramic head used. The wound was then further irrigated with 3  L of saline solution using pulsatile lavage.  Since the fracture was approximately was not fully healed, we decided to replace the plate which was a 4-hole Zimmer trochanteric plate.  We impacted the plate on to the femur with a claw into the greater trochanter.  We placed 1 cable proximally around the greater trochanter through the plate and then 2 cables distally around the femur.  This had fantastic stability throughout full range of motion.  Wound was again irrigated and then the fascia lata closed over Hemovac drain with running #1 V-Loc suture.  Subcu was closed with a V- Loc and interrupted 2-0 Vicryl and then skin closed with staples.  The drains hooked to suction.  Incision clean and dried and sterile dressing applied.  She was then placed into a knee immobilizer awakened  and transported to recovery room in stable condition.  Note that a surgical assistant was a medical necessity for this procedure to do it in a safe and expeditious manner.  Surgical assistance necessary for retraction of vital neurovascular structures and proper positioning of the limb for removal of the old prosthesis in placement of the new 1.     Gaynelle Arabian, M.D.     FA/MEDQ  D:  09/08/2013  T:  09/09/2013  Job:  [id number]

## 2013-09-09 NOTE — Evaluation (Signed)
Physical Therapy Evaluation Patient Details Name: [name] MRN: [id number] DOB: 01-09-1951 Today's Date: 09/09/2013   History of Present Illness  LEFT FEMORAL REVISION -Left.. Pt with history of RAKA.    Clinical Impression  Pt tolerated mobility very well, not ready for transfer pivot today. Pt will benefit from PT while in acute care to address problems listed in note below.    Follow Up Recommendations SNF    Equipment Recommendations  None recommended by PT    Recommendations for Other Services       Precautions / Restrictions Precautions Precautions: Fall Restrictions LLE Weight Bearing: Weight bearing as tolerated      Mobility  Bed Mobility Overal bed mobility: Needs Assistance Bed Mobility: Supine to Sit     Supine to sit: Max assist;HOB elevated Sit to supine: Mod assist;HOB elevated   General bed mobility comments: assist for L LE and use of pad to position hips  Transfers Overall transfer level: Needs assistance               General transfer comment: pt declined  to get to recliner due to pain and  does not have a shoe.  Ambulation/Gait                Stairs            Wheelchair Mobility    Modified Rankin (Stroke Patients Only)       Balance Overall balance assessment: Needs assistance Sitting-balance support: No upper extremity supported;Feet supported Sitting balance-Leahy Scale: Fair Sitting balance - Comments: cannot reach  L foot due to hip pain                                     Pertinent Vitals/Pain Pain Score: 7  Pain Location: L hip, decreased with sitting up    [place] expects to be discharged to:: Skilled nursing facility                      Prior Function                 Hand Dominance        Extremity/Trunk Assessment                 RLE Deficits / Details: AKA LLE Deficits / Details: hip flexion 2+ supine, knee extension 3+,       Communication      Cognition Arousal/Alertness: Awake/alert Behavior During Therapy: WFL for tasks assessed/performed Overall Cognitive Status: Within Functional Limits for tasks assessed                      General Comments      Exercises General Exercises - Lower Extremity Ankle Circles/Pumps: AROM;Left;10 reps Quad Sets: AROM;Left;10 reps;Supine Short Arc Quad: AROM;Left;10 reps;Supine Long Arc Quad: AROM;Left;10 reps      Assessment/Plan    PT Assessment Patient needs continued PT services  PT Diagnosis Difficulty walking;Generalized weakness;Acute pain   PT Problem List Decreased strength;Decreased range of motion;Decreased activity tolerance;Decreased balance;Decreased mobility;Decreased knowledge of precautions;Decreased knowledge of use of DME;Pain  PT Treatment Interventions DME instruction;Gait training;Functional mobility training;Therapeutic activities;Patient/family education;Balance training;Therapeutic exercise   PT Goals (Current goals can be found in the Care Plan section) Acute Rehab PT Goals Patient Stated Goal: get back home PT Goal Formulation: With patient/family Time For Goal Achievement: 09/23/13 Potential to  Achieve Goals: Good    Frequency Min 5X/week   Barriers to discharge        Co-evaluation PT/OT/SLP Co-Evaluation/Treatment: Yes Reason for Co-Treatment: Complexity of the patient's impairments (multi-system involvement);For patient/therapist safety PT goals addressed during session: Mobility/safety with mobility         End of Session   Activity Tolerance: Patient tolerated treatment well Patient left: in bed;with call bell/phone within reach Nurse Communication: Mobility status         Time: 1152-1221 PT Time Calculation (min): 29 min   Charges:   PT Evaluation $Initial PT Evaluation Tier I: 1 Procedure $PT Re-evaluation: 1 Procedure PT Treatments $Therapeutic Activity: 8-22 mins   PT G Codes:           Claretha Cooper 09/09/2013, 12:59 PM

## 2013-09-10 ENCOUNTER — Encounter (HOSPITAL_COMMUNITY): Payer: Self-pay | Admitting: Orthopedic Surgery

## 2013-09-10 ENCOUNTER — Inpatient Hospital Stay (HOSPITAL_COMMUNITY): Payer: BC Managed Care – PPO

## 2013-09-10 DIAGNOSIS — D72829 Elevated white blood cell count, unspecified: Secondary | ICD-10-CM

## 2013-09-10 DIAGNOSIS — D62 Acute posthemorrhagic anemia: Secondary | ICD-10-CM

## 2013-09-10 DIAGNOSIS — D649 Anemia, unspecified: Secondary | ICD-10-CM

## 2013-09-10 LAB — BASIC METABOLIC PANEL
ANION GAP: 8 (ref 5–15)
BUN: 13 mg/dL (ref 6–23)
CALCIUM: 8.5 mg/dL (ref 8.4–10.5)
CO2: 27 mEq/L (ref 19–32)
CREATININE: 0.68 mg/dL (ref 0.50–1.10)
Chloride: 104 mEq/L (ref 96–112)
GFR calc Af Amer: >90 mL/min (ref >90)
GFR calc non Af Amer: >90 mL/min (ref >90)
Glucose, Bld: 142 mg/dL — ABNORMAL HIGH (ref 70–99)
Potassium: 3.9 mEq/L (ref 3.7–5.3)
SODIUM: 139 meq/L (ref 137–147)

## 2013-09-10 LAB — URINALYSIS, ROUTINE W REFLEX MICROSCOPIC
BILIRUBIN URINE: NEGATIVE
Glucose, UA: NEGATIVE mg/dL
KETONES UR: NEGATIVE mg/dL
NITRITE: NEGATIVE
Protein, ur: NEGATIVE mg/dL
Specific Gravity, Urine: 1.021 (ref 1.005–1.030)
UROBILINOGEN UA: 0.2 mg/dL (ref 0.0–1.0)
pH: 6.5 (ref 5.0–8.0)

## 2013-09-10 LAB — TYPE AND SCREEN
ABO/RH(D): O POS
ANTIBODY SCREEN: NEGATIVE
UNIT DIVISION: 0
UNIT DIVISION: 0
Unit division: 0
Unit division: 0

## 2013-09-10 LAB — PREPARE RBC (CROSSMATCH)

## 2013-09-10 LAB — URINE MICROSCOPIC-ADD ON

## 2013-09-10 LAB — CBC
HCT: 23 % — ABNORMAL LOW (ref 36.0–46.0)
Hemoglobin: 7.7 g/dL — ABNORMAL LOW (ref 12.0–15.0)
MCH: 29.6 pg (ref 26.0–34.0)
MCHC: 33.5 g/dL (ref 30.0–36.0)
MCV: 88.5 fL (ref 78.0–100.0)
Platelets: 280 10*3/uL (ref 150–400)
RBC: 2.6 MIL/uL — ABNORMAL LOW (ref 3.87–5.11)
RDW: 15.5 % (ref 11.5–15.5)
WBC: 18.7 10*3/uL — AB (ref 4.0–10.5)

## 2013-09-10 MED ORDER — SODIUM CHLORIDE 0.9 % IV SOLN
Freq: Once | INTRAVENOUS | Status: AC
  Administered 2013-09-10: 12:00:00 via INTRAVENOUS

## 2013-09-10 NOTE — Progress Notes (Signed)
Subjective: 2 Days Post-Op Procedure(s) (LRB): LEFT FEMORAL REVISION (Left) Patient reports pain as mild.   Patient seen in rounds with Dr. Wynelle Link.  Less swelling with thigh. Patient is well, but has had some minor complaints of pain in the hip and thigh, requiring pain medications Plan is to go Skilled nursing facility after hospital stay.  Objective: Vital signs in last 24 hours: Temp:  [98.1 F (36.7 C)-99.1 F (37.3 C)] 98.2 F (36.8 C) (08/28 0511) Pulse Rate:  [67-102] 99 (08/28 0511) Resp:  [16] 16 (08/28 0511) BP: (94-117)/(44-64) 97/44 mmHg (08/28 0511) SpO2:  [97 %-99 %] 97 % (08/28 0511)  Intake/Output from previous day:  Intake/Output Summary (Last 24 hours) at 09/10/13 0847 Last data filed at 09/10/13 0600  Gross per 24 hour  Intake 1437.5 ml  Output   3615 ml  Net -2177.5 ml    Intake/Output this shift:    Labs:  Recent Labs  09/08/13 0457 09/08/13 2113 09/09/13 0529 09/10/13 0532  HGB 10.6* 10.9* 10.2* 7.7*    Recent Labs  09/09/13 0529 09/10/13 0532  WBC 14.9* 18.7*  RBC 3.54* 2.60*  HCT 30.9* 23.0*  PLT 344 280    Recent Labs  09/09/13 0529 09/10/13 0532  NA 133* 139  K 4.9 3.9  CL 101 104  CO2 22 27  BUN 14 13  CREATININE 0.75 0.68  GLUCOSE 193* 142*  CALCIUM 8.0* 8.5   No results found for this basename: LABPT, INR,  in the last 72 hours  EXAM General - Patient is Alert, Appropriate and Oriented Extremity - Neurovascular intact Sensation intact distally No cellulitis present Compartment soft Dressing/Incision - clean, dry, no drainage, healing Motor Function - intact, moving foot and toes well on exam.   Past Medical History  Diagnosis Date  . Complication of anesthesia     NAUSEA  . Chest pain     pt states due to esophageal problem -  primary care MD aware   . Hives     since pneumonia vaccine   . Asthma   . Depression   . Anxiety   . Fibromyalgia   . Spondylosis   . Bulging disc   . Hx of sarcoma of  bone     rt knee with aka   . Phantom limb pain     rt leg  . Leg pain, left   . Eczema     ears  . GERD (gastroesophageal reflux disease)     " alot of burping"  . Constipation, chronic   . Urgency incontinence   . History of transfusion   . Cancer     hx sarcoma rt knee with amputation  . Difficulty sleeping   . Sleep apnea     pt had recent sleep study done but does not know results - report called for and put on chart showing significant obstructive sleep apnea  . Hypertension   . Back pain 08/19/13    low back and  left leg pain due to nerve problems per pt  . PONV (postoperative nausea and vomiting)   . Peripheral neuropathy   . History of bronchitis   . History of pneumonia   . Hiatal hernia   . History of scarlet fever   . Postmenopausal   . History of measles   . History of mumps   . History of rubella     Assessment/Plan: 2 Days Post-Op Procedure(s) (LRB): LEFT FEMORAL REVISION (Left) Active Problems:   Fibromyalgia  GERD (gastroesophageal reflux disease)   Postoperative anemia due to acute blood loss   Depression   Anxiety state, unspecified   Phantom limb (syndrome)   Obstructive sleep apnea   Transfusion history   Dislocation of hip joint prosthesis   HTN (hypertension)   Left hip pain   Femoral loosening of prosthetic left hip  Estimated body mass index is 26.88 kg/(m^2) as calculated from the following:   Height as of this encounter: 5\' 2"  (1.575 m).   Weight as of this encounter: 66.679 kg (147 lb). Up with therapy Discharge to SNF  DVT Prophylaxis - Xarelto Weight Bearing As Tolerated left Leg Hemovac removed HGB down to 7.7.  Will give blood. Elevated WBC but like due to surgical stress. Will monitor thru the week end.  Dr. Anne Fu partners will be checking on daily Hopefully ready for SNF by first of the week.  Arlee Muslim, PA-C Orthopaedic Surgery 09/10/2013, 8:47 AM

## 2013-09-10 NOTE — Progress Notes (Addendum)
CSW assisting with d/c planning for SNF. [employer] has been contacted for an up date on insurance authorization status. CSW has been informed that SNF has not been able to connect with the right person at [facility] . " I keep getting referred to different people and no one is able to assist with this process". CSW has contacted pt's daughter to update. Pt / daughter will contact [employer] to request assistance. CSW will continue to follow to assist with d/c planning.  Werner Lean LCSW [phone]

## 2013-09-10 NOTE — Progress Notes (Signed)
CARE MANAGEMENT NOTE 09/10/2013  Patient:  [name], [name]   Account Number:  [id number]  Date Initiated:  09/07/2013  Documentation initiated by:  Crews Mccollam  Subjective/Objective Assessment:   pt readmitted due to turning bed and dislocating newly revised hip prothesis     Action/Plan:   snf -[place]   Anticipated DC Date:  09/13/2013   Anticipated DC Plan:  [place]  In-house referral  Clinical Social Worker      DC Planning Services  CM consult      [facility] Choice  NA   Choice offered to / List presented to:  NA   DME arranged  NA      DME agency  NA     [place] arranged  NA      [place] agency  NA   Status of service:  In process, will continue to follow Medicare Important Message given?  NA - LOS <3 / Initial given by admissions (If response is "NO", the following Medicare IM given date fields will be blank) Date Medicare IM given:   Medicare IM given by:   Date Additional Medicare IM given:   Additional Medicare IM given by:    Discharge Disposition:    Per UR Regulation:  Reviewed for med. necessity/level of care/duration of stay  If discussed at [place] of Stay Meetings, dates discussed:    Comments:  Corinna Capra, CCM [id number] is 2nd post op/ hgb 7.7 and getting prbc/up with P.T. only/plan is to retrun snf on Monday [id number] if hgb stabilizes.

## 2013-09-10 NOTE — Progress Notes (Signed)
Physical Therapy Treatment Patient Details Name: [name] MRN: [id number] DOB: 1950/08/22 Today's Date: 09/10/2013    History of Present Illness LEFT FEMORAL REVISION -Left.. Pt with history of RAKA.      PT Comments    Pt just completed her 2nd unit of blood.  Feeling VERY tired.  With + 2 assist transitioned pt from supine to EOB.  Static sitting only 5 min due to MAX c/o weakness and feeling "like I need to take a nap". Performed 10 reps LAQ's then returned to supine position to perform L LE TE's.  Repositioned to comfort and applied ICE.    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 Restrictions Weight Bearing Restrictions: Yes LLE Weight Bearing: Weight bearing as tolerated    Mobility  Bed Mobility Overal bed mobility: Needs Assistance Bed Mobility: Supine to Sit;Sit to Supine     Supine to sit: Max assist;+2 for physical assistance;+2 for safety/equipment Sit to supine: Max assist;+2 for physical assistance;+2 for safety/equipment   General bed mobility comments: extra asssit due to pain level and 'cramps" in L groin  Transfers    deferred OOB activity due to max c/o fatigue                Ambulation/Gait                 Stairs            Wheelchair Mobility    Modified Rankin (Stroke Patients Only)       Balance                                    Cognition                            Exercises   Total Hip Replacement TE's 10 reps ankle pumps 10 reps knee presses 10 reps heel slides 10 reps SAQ's 10 reps ABD Followed by ICE'    General Comments        Pertinent Vitals/Pain  supine  BP 116/53, HR 102, RA 99% EOB BP 128/61, HR 100, RA 102    Home Living                      Prior Function             PT Goals (current goals can now be found in the care plan section)      Frequency       PT Plan      Co-evaluation             End of Session           Time: 1450-1515 PT Time Calculation (min): 25 min  Charges:  $Therapeutic Exercise: 8-22 mins $Therapeutic Activity: 8-22 mins                    G Codes:      Rica Koyanagi  PTA [facility]  Acute  Rehab Pager      [phone]

## 2013-09-10 NOTE — Progress Notes (Addendum)
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 Wynelle Link -  DVT proph with xarelto -  Weight bearing status per orthopedics -  Drain pulled yesterday -  Continue PT/OT  Acute hypoxic respiratory failure likely due to atelectasis, routine use post-operative, resolved.  HTN (hypertension), blood pressure still low normal -  Continue to hold irbesartan -  Blood transfusion today  Fibromyalgia, continue prn dilaudid and celexa  GERD (gastroesophageal reflux disease), stable, continue nexium   Depression/Anxiety, feeling better post surgery, continue wellbutrin, celexa, and buspar, and continue increased frequency of prn ativan.  Needs close follow up with primary psychiatrist  Pruritis with history of hives being managed by dermatologist, improved.  Continue loratadine, prn benadryl, and doxepin  Hyponatremia, may be due to dehydration post large surgery, mild and asymptomatic, resolved.  Leukocytosis, likely reactive from surgery but worse today.  asymptomatic -  UA and CXR negative for infection -  D/c foley ASAP  Microscopic hematuria likely due to foley.  Repeat as outpatient  Worsening anemia, likely acute blood loss anemia from recent surgery superimposed on iron deficiency and chronic disease -  Continue iron supplements.   -  Transfused 2 units PRBC intraoperatively on 8/26. -  Transfuse 1 unit PRBC tday -  Repeat hgb in AM  Thrombocytosis, reactive from femur fracture and trending down  Diet:  regular Access:  PIV IVF:  yes Proph: xarelto  Code Status: full  Family Communication: None at bedside  Disposition Plan: Pending PT recommendations and ortho recommendations    Consultants:  Ortho. Dr. Wynelle Link Procedures:  Pending revision planed for 09/08/13 Antibiotics:  Perioperative  Vancomycin   HPI/Subjective:  Feels well.  Some pain in the left leg, but otherwise feeling well this AM.  Denies SOB, cough, bladder discomfort, lightheadedness  Objective: Filed Vitals:   09/09/13 2115 09/09/13 2124 09/09/13 2354 09/10/13 0511  BP: 114/64 94/54 101/48 97/44  Pulse: 67 100  99  Temp: 98.1 F (36.7 C) 98.4 F (36.9 C)  98.2 F (36.8 C)  TempSrc: Oral Oral  Oral  Resp: 16 16  16   Height:      Weight:      SpO2: 98% 98%  97%    Intake/Output Summary (Last 24 hours) at 09/10/13 1102 Last data filed at 09/10/13 0900  Gross per 24 hour  Intake 1437.5 ml  Output   2315 ml  Net -877.5 ml   Filed Weights   09/06/13 2053  Weight: 66.679 kg (147 lb)    Exam:   General:  WF, No acute distress  HEENT:  NCAT, MMM  Cardiovascular:  RRR, nl S1, S2 no mrg, 2+ pulses, warm extremities  Respiratory:  CTAB, no increased WOB  Abdomen:   NABS, soft, NT/ND  MSK:   Normal tone and bulk upper extremities.  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  Left leg with minimal swelling.  Dressing c/d/i without bleeding, hematoma, or ecchymosis.    Neuro:  Grossly intact  Data Reviewed: Basic Metabolic Panel:  Recent Labs Lab 09/06/13 1844 09/08/13 0457 09/09/13 0529 09/10/13 0532  NA 140 140 133* 139  K 4.1 3.9 4.9 3.9  CL 103 102 101 104  CO2 25 26 22 27   GLUCOSE 136* 97 193* 142*  BUN 15 16 14 13   CREATININE 0.70 0.80 0.75 0.68  CALCIUM 8.9 9.0 8.0* 8.5  Liver Function Tests: No results found for this basename: AST, ALT, ALKPHOS, BILITOT, PROT, ALBUMIN,  in the last 168 hours No results found for this basename: LIPASE, AMYLASE,  in the last 168 hours No results found for this basename: AMMONIA,  in the last 168 hours CBC:  Recent Labs Lab 09/06/13 1844 09/08/13 0457 09/08/13 2113 09/09/13 0529 09/10/13 0532  WBC 9.8 7.9  --  14.9* 18.7*  NEUTROABS 5.6  --   --   --   --   HGB 10.6* 10.6* 10.9* 10.2* 7.7*  HCT 33.0* 34.0* 32.5* 30.9* 23.0*   MCV 91.7 93.2  --  87.3 88.5  PLT 565* 472*  --  344 280   Cardiac Enzymes: No results found for this basename: CKTOTAL, CKMB, CKMBINDEX, TROPONINI,  in the last 168 hours BNP (last 3 results) No results found for this basename: PROBNP,  in the last 8760 hours CBG: No results found for this basename: GLUCAP,  in the last 168 hours  Recent Results (from the past 240 hour(s))  SURGICAL PCR SCREEN     Status: Abnormal   Collection Time    09/06/13 10:12 PM      Result Value Ref Range Status   MRSA, PCR NEGATIVE  NEGATIVE Final   Staphylococcus aureus POSITIVE (*) NEGATIVE Final   Comment:            The Xpert SA Assay (FDA     approved for NASAL specimens     in patients over 46 years of age),     is one component of     a comprehensive surveillance     program.  Test performance has     been validated by [employer] for patients greater     than or equal to 32 year old.     It is not intended     to diagnose infection nor to     guide or monitor treatment.     Studies: Dg Pelvis Portable  09/08/2013   CLINICAL DATA:  Status post left hip surgery.  EXAM: PORTABLE PELVIS 1-2 VIEWS  COMPARISON:  08/21/2013.  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 A new left total hip prosthesis is demonstrated with interval discontinuity of the upper cerclage wire. No acute fracture or dislocation seen.  IMPRESSION: Interval replacement of the left hip prosthesis with discontinuity of the upper cerclage wire.   Electronically Signed   By: Enrique Sack M.D.   On: 09/08/2013 20:31   Dg Chest Port 1 View  09/10/2013   CLINICAL DATA:  Elevated white blood count, cough, shortness of breath  EXAM: PORTABLE CHEST - 1 VIEW  COMPARISON:  07/12/2013  FINDINGS: The heart size and mediastinal contours are within normal limits. Both lungs are clear. The visualized skeletal structures are unremarkable.  IMPRESSION: No  active disease.   Electronically Signed   By: Skipper Cliche M.D.   On: 09/10/2013 07:41    Scheduled Meds: . sodium chloride   Intravenous Once  . buPROPion  150 mg Oral BID  . busPIRone  10 mg Oral BID  . citalopram  10 mg Oral q morning - 10a  . cyclobenzaprine  10 mg Oral BID  . docusate sodium  100 mg Oral BID  . doxepin  10 mg Oral QHS  . esomeprazole  20 mg Oral BID  . iron polysaccharides  150 mg  Oral BID  . loratadine  10 mg Oral Daily  . mupirocin ointment   Nasal BID  . oxybutynin  5 mg Oral Daily  . polyethylene glycol  17 g Oral BID  . rivaroxaban  10 mg Oral Q breakfast  . senna  1 tablet Oral BID   Continuous Infusions: . sodium chloride Stopped (09/09/13 1222)    Active Problems:   Fibromyalgia   GERD (gastroesophageal reflux disease)   Postoperative anemia due to acute blood loss   Depression   Anxiety state, unspecified   Phantom limb (syndrome)   Obstructive sleep apnea   Transfusion history   Dislocation of hip joint prosthesis   HTN (hypertension)   Left hip pain   Femoral loosening of prosthetic left hip    Time spent: 30 min    Kaniyah Lisby, [facility] Pager [phone]. If 7PM-7AM, please contact night-coverage at [email or web address], password [facility] 09/10/2013, 11:02 AM  LOS: 4 days

## 2013-09-10 NOTE — Progress Notes (Signed)
Occupational Therapy Treatment Patient Details Name: [name] MRN: [id number] DOB: Aug 12, 1950 Today's Date: 09/10/2013    History of present illness LEFT FEMORAL REVISION -Left.. Pt with history of RAKA.     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 Restrictions Weight Bearing Restrictions: Yes LLE Weight Bearing: Weight bearing as tolerated       Mobility Bed Mobility Overal bed mobility: Needs Assistance Bed Mobility: Supine to Sit;Sit to Supine     Supine to sit: Mod assist;HOB elevated Sit to supine: Mod assist   General bed mobility comments: assist for L LE and use of pad to position hips, used rails  Transfers                      Balance   Sitting-balance support: Feet supported;Single extremity supported;No upper extremity supported Sitting balance-Leahy Scale: Fair                             ADL Overall ADL's : Needs assistance/impaired     Grooming: Wash/dry hands;Wash/dry face;Oral care;Sitting;Set up;Supervision/safety   Upper Body Bathing: Set up;Sitting   Lower Body Bathing: Moderate assistance;Sitting/lateral leans;Bed level Lower Body Bathing Details (indicate cue type and reason): mod A to bathe L LE and buttocks. Required being returned to supine position in bed and rolling for thoroughness Upper Body Dressing : Supervision/safety;Set up;Sitting                     General ADL Comments: Pt sat EOB for bathing and dressing, required min A for balance during pericare/hygiene leaning side to side      Vision  no change from  baseline                   Perception Perception Perception Tested?: No   Praxis Praxis Praxis tested?: Not tested    Cognition   Behavior During Therapy: WFL for tasks assessed/performed Overall Cognitive Status: Within Functional Limits for tasks assessed                                                 General Comments  Pt very pleasant and cooperative    Pertinent Vitals/ Pain       Pain Assessment: 0-10 Pain Score: 5  Pain Location: L hip, spasms Pain Descriptors / Indicators: Spasm;Sore Pain Intervention(s): RN gave pain meds during session;Limited activity within patient's tolerance;Repositioned;Monitored during session                                                          Frequency Min 2X/week     Progress Toward Goals  OT Goals(current goals can now be found in the care plan section)  Progress towards OT goals: Progressing toward goals  Plan Discharge plan remains appropriate                     End of Session     Activity Tolerance Patient tolerated treatment well   Patient Left in bed;with call bell/phone within reach             Time: 1010-1049 OT Time Calculation (min): 39 min  Charges: OT General Charges $OT Visit: 1 Procedure OT Treatments $Self Care/Home Management : 23-37 mins $Therapeutic Activity: 8-22 mins  Britt Bottom 09/10/2013, 11:26 AM

## 2013-09-11 LAB — CBC
HEMATOCRIT: 29.9 % — AB (ref 36.0–46.0)
Hemoglobin: 10.1 g/dL — ABNORMAL LOW (ref 12.0–15.0)
MCH: 29.4 pg (ref 26.0–34.0)
MCHC: 33.8 g/dL (ref 30.0–36.0)
MCV: 86.9 fL (ref 78.0–100.0)
Platelets: 255 10*3/uL (ref 150–400)
RBC: 3.44 MIL/uL — ABNORMAL LOW (ref 3.87–5.11)
RDW: 15.5 % (ref 11.5–15.5)
WBC: 14.6 10*3/uL — ABNORMAL HIGH (ref 4.0–10.5)

## 2013-09-11 LAB — TYPE AND SCREEN
ABO/RH(D): O POS
Antibody Screen: NEGATIVE
UNIT DIVISION: 0
Unit division: 0

## 2013-09-11 NOTE — Progress Notes (Signed)
Subjective: 3 Days Post-Op Procedure(s) (LRB): LEFT FEMORAL REVISION (Left) Patient reports pain as 3 on 0-10 scale.    Objective: Vital signs in last 24 hours: Temp:  [98.6 F (37 C)-99.6 F (37.6 C)] 99.3 F (37.4 C) (08/29 0556) Pulse Rate:  [82-107] 100 (08/29 0556) Resp:  [16-18] 18 (08/29 0741) BP: (90-116)/(44-58) 110/58 mmHg (08/29 0556) SpO2:  [94 %-100 %] 96 % (08/29 0741)  Intake/Output from previous day: 08/28 0701 - 08/29 0700 In: 1075 [P.O.:720; Blood:355] Out: 2225 [Urine:2225] Intake/Output this shift:     Recent Labs  09/08/13 2113 09/09/13 0529 09/10/13 0532 09/11/13 0518  HGB 10.9* 10.2* 7.7* 10.1*    Recent Labs  09/10/13 0532 09/11/13 0518  WBC 18.7* 14.6*  RBC 2.60* 3.44*  HCT 23.0* 29.9*  PLT 280 255    Recent Labs  09/09/13 0529 09/10/13 0532  NA 133* 139  K 4.9 3.9  CL 101 104  CO2 22 27  BUN 14 13  CREATININE 0.75 0.68  GLUCOSE 193* 142*  CALCIUM 8.0* 8.5   No results found for this basename: LABPT, INR,  in the last 72 hours  Neurologically intact Intact pulses distally Incision: scant drainage  Assessment/Plan: 3 Days Post-Op Procedure(s) (LRB): LEFT FEMORAL REVISION (Left) Advance diet Up with therapy D/C IV fluids Dsg change  Kiandre Spagnolo C 09/11/2013, 9:26 AM

## 2013-09-11 NOTE — Progress Notes (Signed)
[facility] PROGRESS NOTE  [name] [id number]:[id number] DOB: 29-Jun-1950 DOA: 09/06/2013 PCP: Shary Key, MD  Assessment/Plan  Left leg hip prosthesis dislocation s/p left hip prosthesis revision on 8/26 by Dr. Wynelle Link -  DVT proph with xarelto -  Weight bearing status per orthopedics -  Drain pulled, minimal bleeding -  Continue PT/OT, plan for rehab on Monday  Acute hypoxic respiratory failure likely due to atelectasis, routine use post-operative, resolved.  HTN (hypertension), blood pressure still low normal -  Continue to hold irbesartan -  Blood transfusion today  Fibromyalgia, continue prn dilaudid and celexa  GERD (gastroesophageal reflux disease), stable, continue nexium   Depression/Anxiety, feeling better post surgery, continue wellbutrin, celexa, and buspar, and continue increased frequency of prn ativan.  Needs close follow up with primary psychiatrist  Pruritis with history of hives being managed by dermatologist, improved.  Continue loratadine, prn benadryl, and doxepin  Hyponatremia, may be due to dehydration post large surgery, mild and asymptomatic, resolved.  Leukocytosis, likely reactive from surgery but worse today.  asymptomatic -  UA and CXR negative for infection -  D/c foley  Microscopic hematuria likely due to foley.  Repeat as outpatient  Acute blood loss anemia from recent surgery superimposed on iron deficiency and chronic disease.  hgb improved post transfusion. -  Continue iron supplements.   -  Transfused 2 units PRBC intraoperatively on 8/26. -  Transfuse 1 unit PRBC 8/28 -  Repeat hgb in AM  Thrombocytosis, reactive from femur fracture and trending down  Diet:  regular Access:  PIV IVF:  yes Proph: xarelto  Code Status: full  Family Communication: None at bedside  Disposition Plan:  SNF Monday    Consultants:  Ortho. Dr. Wynelle Link Procedures:  Pending revision planed for 09/08/13 Antibiotics:  Perioperative  Vancomycin   HPI/Subjective:  Feels well. Denies SOB, cough, bladder discomfort, lightheadedness.  Wants foley out.    Objective: Filed Vitals:   09/10/13 2338 09/11/13 0351 09/11/13 0556 09/11/13 0741  BP:   110/58   Pulse:   100   Temp:   99.3 F (37.4 C)   TempSrc:   Oral   Resp: 18 16 16 18   Height:      Weight:      SpO2: 100% 98% 94% 96%    Intake/Output Summary (Last 24 hours) at 09/11/13 0819 Last data filed at 09/11/13 0554  Gross per 24 hour  Intake   1075 ml  Output   2225 ml  Net  -1150 ml   Filed Weights   09/06/13 2053  Weight: 66.679 kg (147 lb)    Exam:   General:  WF, No acute distress  HEENT:  NCAT, MMM  Cardiovascular:  RRR, nl S1, S2 no mrg, 2+ pulses, warm extremities  Respiratory:  CTAB, no increased WOB  Abdomen:   NABS, soft, NT/ND  MSK:   Normal tone and bulk upper extremities.  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  Left leg with trace edema.  Dressing c/d/i without bleeding, hematoma, or ecchymosis.  Mild subtle pinkness surrounding incision  Neuro:  Grossly intact  Data Reviewed: Basic Metabolic Panel:  Recent Labs Lab 09/06/13 1844 09/08/13 0457 09/09/13 0529 09/10/13 0532  NA 140 140 133* 139  K 4.1 3.9 4.9 3.9  CL 103 102 101 104  CO2 25 26 22 27   GLUCOSE 136* 97 193* 142*  BUN 15 16 14 13   CREATININE 0.70 0.80 0.75 0.68  CALCIUM 8.9 9.0 8.0* 8.5   Liver  Function Tests: No results found for this basename: AST, ALT, ALKPHOS, BILITOT, PROT, ALBUMIN,  in the last 168 hours No results found for this basename: LIPASE, AMYLASE,  in the last 168 hours No results found for this basename: AMMONIA,  in the last 168 hours CBC:  Recent Labs Lab 09/06/13 1844 09/08/13 0457 09/08/13 2113 09/09/13 0529 09/10/13 0532 09/11/13 0518  WBC 9.8 7.9  --  14.9* 18.7* 14.6*  NEUTROABS 5.6  --   --   --   --   --   HGB 10.6* 10.6* 10.9* 10.2* 7.7* 10.1*  HCT 33.0* 34.0* 32.5* 30.9* 23.0* 29.9*  MCV 91.7 93.2  --  87.3  88.5 86.9  PLT 565* 472*  --  344 280 255   Cardiac Enzymes: No results found for this basename: CKTOTAL, CKMB, CKMBINDEX, TROPONINI,  in the last 168 hours BNP (last 3 results) No results found for this basename: PROBNP,  in the last 8760 hours CBG: No results found for this basename: GLUCAP,  in the last 168 hours  Recent Results (from the past 240 hour(s))  SURGICAL PCR SCREEN     Status: Abnormal   Collection Time    09/06/13 10:12 PM      Result Value Ref Range Status   MRSA, PCR NEGATIVE  NEGATIVE Final   Staphylococcus aureus POSITIVE (*) NEGATIVE Final   Comment:            The Xpert SA Assay (FDA     approved for NASAL specimens     in patients over 43 years of age),     is one component of     a comprehensive surveillance     program.  Test performance has     been validated by [employer] for patients greater     than or equal to 9 year old.     It is not intended     to diagnose infection nor to     guide or monitor treatment.     Studies: Dg Chest Port 1 View  09/10/2013   CLINICAL DATA:  Elevated white blood count, cough, shortness of breath  EXAM: PORTABLE CHEST - 1 VIEW  COMPARISON:  07/12/2013  FINDINGS: The heart size and mediastinal contours are within normal limits. Both lungs are clear. The visualized skeletal structures are unremarkable.  IMPRESSION: No active disease.   Electronically Signed   By: Skipper Cliche M.D.   On: 09/10/2013 07:41    Scheduled Meds: . buPROPion  150 mg Oral BID  . busPIRone  10 mg Oral BID  . citalopram  10 mg Oral q morning - 10a  . cyclobenzaprine  10 mg Oral BID  . docusate sodium  100 mg Oral BID  . doxepin  10 mg Oral QHS  . esomeprazole  20 mg Oral BID  . iron polysaccharides  150 mg Oral BID  . loratadine  10 mg Oral Daily  . mupirocin ointment   Nasal BID  . oxybutynin  5 mg Oral Daily  . polyethylene glycol  17 g Oral BID  . rivaroxaban  10 mg Oral Q breakfast  . senna  1 tablet Oral BID   Continuous  Infusions: . sodium chloride Stopped (09/09/13 1222)    Active Problems:   Fibromyalgia   GERD (gastroesophageal reflux disease)   Postoperative anemia due to acute blood loss   Depression   Anxiety state, unspecified   Phantom limb (syndrome)   Obstructive  sleep apnea   Transfusion history   Dislocation of hip joint prosthesis   HTN (hypertension)   Left hip pain   Femoral loosening of prosthetic left hip    Time spent: 30 min    Mina Babula, [facility] Pager [phone]. If 7PM-7AM, please contact night-coverage at [email or web address], password [facility] 09/11/2013, 8:19 AM  LOS: 5 days

## 2013-09-12 ENCOUNTER — Inpatient Hospital Stay (HOSPITAL_COMMUNITY): Payer: BC Managed Care – PPO

## 2013-09-12 LAB — URINALYSIS, ROUTINE W REFLEX MICROSCOPIC
Bilirubin Urine: NEGATIVE
GLUCOSE, UA: NEGATIVE mg/dL
HGB URINE DIPSTICK: NEGATIVE
Ketones, ur: NEGATIVE mg/dL
LEUKOCYTES UA: NEGATIVE
Nitrite: NEGATIVE
PROTEIN: NEGATIVE mg/dL
Specific Gravity, Urine: 1.013 (ref 1.005–1.030)
Urobilinogen, UA: 1 mg/dL (ref 0.0–1.0)
pH: 7.5 (ref 5.0–8.0)

## 2013-09-12 MED ORDER — VANCOMYCIN HCL IN DEXTROSE 750-5 MG/150ML-% IV SOLN
750.0000 mg | Freq: Two times a day (BID) | INTRAVENOUS | Status: DC
  Filled 2013-09-12: qty 150

## 2013-09-12 MED ORDER — VANCOMYCIN HCL IN DEXTROSE 750-5 MG/150ML-% IV SOLN
750.0000 mg | Freq: Two times a day (BID) | INTRAVENOUS | Status: DC
Start: 2013-09-13 — End: 2013-09-15
  Administered 2013-09-13 – 2013-09-15 (×6): 750 mg via INTRAVENOUS
  Filled 2013-09-12 (×6): qty 150

## 2013-09-12 MED ORDER — DEXTROSE 5 % IV SOLN
2.0000 g | Freq: Three times a day (TID) | INTRAVENOUS | Status: DC
  Administered 2013-09-12 – 2013-09-18 (×19): 2 g via INTRAVENOUS
  Filled 2013-09-12 (×21): qty 2

## 2013-09-12 MED ORDER — VANCOMYCIN HCL 10 G IV SOLR
1500.0000 mg | Freq: Once | INTRAVENOUS | Status: AC
  Administered 2013-09-12: 1500 mg via INTRAVENOUS
  Filled 2013-09-12: qty 1500

## 2013-09-12 MED ORDER — SACCHAROMYCES BOULARDII 250 MG PO CAPS
250.0000 mg | ORAL_CAPSULE | Freq: Two times a day (BID) | ORAL | Status: DC
  Administered 2013-09-12 – 2013-09-21 (×17): 250 mg via ORAL
  Filled 2013-09-12 (×20): qty 1

## 2013-09-12 NOTE — Progress Notes (Signed)
Subjective: 4 Days Post-Op Procedure(s) (LRB): LEFT FEMORAL REVISION (Left) Patient reports pain as 3 on 0-10 scale.Afebrile and WBC is 14.6,was 18.7.    Objective: Vital signs in last 24 hours: Temp:  [98.7 F (37.1 C)-100.1 F (37.8 C)] 98.7 F (37.1 C) (08/30 0536) Pulse Rate:  [95-100] 95 (08/30 0536) Resp:  [16-18] 18 (08/30 0536) BP: (104-113)/(44-49) 104/48 mmHg (08/30 0536) SpO2:  [95 %-98 %] 96 % (08/30 0536)  Intake/Output from previous day: 08/29 0701 - 08/30 0700 In: 1080 [P.O.:1080] Out: 1700 [Urine:1700] Intake/Output this shift:     Recent Labs  09/10/13 0532 09/11/13 0518  HGB 7.7* 10.1*    Recent Labs  09/10/13 0532 09/11/13 0518  WBC 18.7* 14.6*  RBC 2.60* 3.44*  HCT 23.0* 29.9*  PLT 280 255    Recent Labs  09/10/13 0532  NA 139  K 3.9  CL 104  CO2 27  BUN 13  CREATININE 0.68  GLUCOSE 142*  CALCIUM 8.5   No results found for this basename: LABPT, INR,  in the last 72 hours  Neurologically intact  Assessment/Plan: 4 Days Post-Op Procedure(s) (LRB): LEFT FEMORAL REVISION (Left) Discharge to SNF  [name] A 09/12/2013, 9:35 AM

## 2013-09-12 NOTE — Progress Notes (Signed)
ANTIBIOTIC CONSULT NOTE - INITIAL  Pharmacy Consult for Vancomycin/Aztreonam Indication: Sepsis  Allergies  Allergen Reactions  . Bactrim [Sulfamethoxazole-Tmp Ds] Shortness Of Breath  . Cefuroxime Shortness Of Breath  . Omnipaque [Iohexol] Shortness Of Breath    Patient stated that she had a reaction a few years ago to the IV dye and had trouble breathing--so for her CT Angio Chest today (07/12/13), ER MD gave her solumedrol and Benadryl before she had test and did very well with that.  ak 07/12/13  . Pneumococcal Vaccines Hives    Hives over entire body after vaccine  . Penicillins Diarrhea  . Sulfa Antibiotics Other (See Comments)    unknown    Patient Measurements: Height: 5\' 2"  (157.5 cm) Weight: 147 lb (66.679 kg) IBW/kg (Calculated) : 50.1 Adjusted Body Weight: 57 kg  Vital Signs: Temp: 99.6 F (37.6 C) (08/30 1612) Temp src: Oral (08/30 1612) BP: 116/46 mmHg (08/30 1513) Pulse Rate: 96 (08/30 1513) Intake/Output from previous day: 08/29 0701 - 08/30 0700 In: 1080 [P.O.:1080] Out: 1700 [Urine:1700] Intake/Output from this shift: Total I/O In: 480 [P.O.:480] Out: -   Labs:  Recent Labs  09/10/13 0532 09/11/13 0518  WBC 18.7* 14.6*  HGB 7.7* 10.1*  PLT 280 255  CREATININE 0.68  --    Estimated Creatinine Clearance: 65.3 ml/min (by C-G formula based on Cr of 0.68). No results found for this basename: Letta Median, VANCORANDOM, GENTTROUGH, GENTPEAK, GENTRANDOM, TOBRATROUGH, TOBRAPEAK, TOBRARND, AMIKACINPEAK, AMIKACINTROU, AMIKACIN,  in the last 72 hours   Microbiology: Recent Results (from the past 720 hour(s))  URINE CULTURE     Status: None   Collection Time    08/19/13  6:37 PM      Result Value Ref Range Status   Specimen Description URINE, CATHETERIZED   Final   Special Requests Normal   Final   Culture  Setup Time     Final   Value: 08/19/2013 20:24     Performed at [facility]     Final   Value: NO GROWTH   Performed at [facility]   Culture     Final   Value: NO GROWTH     Performed at [facility]   Report Status 08/20/2013 FINAL   Final  SURGICAL PCR SCREEN     Status: Abnormal   Collection Time    08/21/13  6:10 AM      Result Value Ref Range Status   MRSA, PCR NEGATIVE  NEGATIVE Final   Staphylococcus aureus POSITIVE (*) NEGATIVE Final   Comment:            The Xpert SA Assay (FDA     approved for NASAL specimens     in patients over 60 years of age),     is one component of     a comprehensive surveillance     program.  Test performance has     been validated by [employer] for patients greater     than or equal to 64 year old.     It is not intended     to diagnose infection nor to     guide or monitor treatment.  SURGICAL PCR SCREEN     Status: Abnormal   Collection Time    09/06/13 10:12 PM      Result Value Ref Range Status   MRSA, PCR NEGATIVE  NEGATIVE Final   Staphylococcus aureus POSITIVE (*) NEGATIVE Final  Comment:            The Xpert SA Assay (FDA     approved for NASAL specimens     in patients over 63 years of age),     is one component of     a comprehensive surveillance     program.  Test performance has     been validated by [employer] for patients greater     than or equal to 33 year old.     It is not intended     to diagnose infection nor to     guide or monitor treatment.    Medical History: Past Medical History  Diagnosis Date  . Complication of anesthesia     NAUSEA  . Chest pain     pt states due to esophageal problem -  primary care MD aware   . Hives     since pneumonia vaccine   . Asthma   . Depression   . Anxiety   . Fibromyalgia   . Spondylosis   . Bulging disc   . 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 Phantom limb pain     rt leg  . Leg pain, left   . Eczema     ears  . GERD (gastroesophageal reflux disease)     " alot of burping"  . Constipation, chronic   . Urgency incontinence    . History of transfusion   . Cancer     hx sarcoma rt knee with amputation  . Difficulty sleeping   . Sleep apnea     pt had recent sleep study done but does not know results - report called for and put on chart showing significant obstructive sleep apnea  . Hypertension   . Back pain 08/19/13    low back and  left leg pain due to nerve problems per pt  . PONV (postoperative nausea and vomiting)   . Peripheral neuropathy   . History of bronchitis   . History of pneumonia   . Hiatal hernia   . History of scarlet fever   . Postmenopausal   . History of measles   . History of mumps   . History of rubella     Medications:  Anti-infectives   Start     Dose/Rate Route Frequency Ordered Stop   09/13/13 0500  vancomycin (VANCOCIN) IVPB 750 mg/150 ml premix     750 mg 150 mL/hr over 60 Minutes Intravenous Every 12 hours 09/12/13 1649     09/12/13 1700  vancomycin (VANCOCIN) 1,500 mg in sodium chloride 0.9 % 500 mL IVPB     1,500 mg 250 mL/hr over 120 Minutes Intravenous  Once 09/12/13 1649     09/12/13 1645  vancomycin (VANCOCIN) IVPB 750 mg/150 ml premix  Status:  Discontinued     750 mg 150 mL/hr over 60 Minutes Intravenous Every 12 hours 09/12/13 1636 09/12/13 1647   09/12/13 1645  aztreonam (AZACTAM) 2 g in dextrose 5 % 50 mL IVPB     2 g 100 mL/hr over 30 Minutes Intravenous 3 times per day 09/12/13 1636     09/09/13 0600  vancomycin (VANCOCIN) IVPB 1000 mg/200 mL premix     1,000 mg 200 mL/hr over 60 Minutes Intravenous Every 12 hours 09/08/13 2138 09/09/13 0725   09/08/13 0800  vancomycin (VANCOCIN) IVPB 1000 mg/200 mL premix     1,000 mg  200 mL/hr over 60 Minutes Intravenous  Once 09/07/13 0816 09/08/13 1805   09/08/13 0709  vancomycin (VANCOCIN) IVPB 1000 mg/200 mL premix  Status:  Discontinued     1,000 mg 200 mL/hr over 60 Minutes Intravenous On call to O.R. 09/08/13 0709 09/08/13 2137     Assessment: 62yoF underwent L femoral revision on 8/27; now with new fever  and leukocytosis on POD 4.  Meets criteria for sepsis; will start vanco/aztreonam per MD.  Vancomycin 8/30 >>  Aztreonam 8/30 >>   WBC elevated Tmax 102.71F SCr 0.7; CrCl 65 ml/min  BCx 8/30: pending x2 UCx 8/30: pending  Goal of Therapy:  Vancomycin trough level 15-20 mcg/ml Eradication of infection; appropriate renal dosing of antibiotics  Plan:  Vancomycin 1500 mg IV x 1 now Vancomycin 750 mg IV q12 starting at 0500 on 8/31 Aztreonam 2 g IV q8h Measure antibiotic drug levels at steady state Follow up culture results  Reuel Boom, PharmD [phone] 09/12/2013 5:17 PM

## 2013-09-12 NOTE — Progress Notes (Signed)
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 Wynelle Link -  DVT proph with xarelto -  Weight bearing status per orthopedics -  Drain pulled, minimal bleeding -  Continue PT/OT -  SNF on Monday  Leukocytosis, trending down but had new low-grade temperature today.  Has mild cough -  Repeat UA and CXR   Acute blood loss anemia from recent surgery superimposed on iron deficiency and chronic disease.  hgb improved post transfusion. -  Continue iron supplements.   -  Transfused 2 units PRBC intraoperatively on 8/26. -  Transfused 1 unit PRBC 8/28  Acute hypoxic respiratory failure likely due to atelectasis, routine use post-operative, resolved. HTN (hypertension), blood pressure low normal.  Continue to hold irbesartan. Fibromyalgia, continue prn dilaudid and celexa GERD (gastroesophageal reflux disease), stable, continue nexium  Depression/Anxiety, feeling better post surgery, continue wellbutrin, celexa, and buspar, and continue increased frequency of prn ativan.  Needs close follow up with primary psychiatrist Pruritis with history of hives being managed by dermatologist, improved.  Continue loratadine, prn benadryl, and doxepin Hyponatremia, may be due to dehydration post large surgery, mild and asymptomatic, resolved. Thrombocytosis, reactive from femur fracture and resolved.  Diet:  regular Access:  PIV IVF:  yes Proph: xarelto  Code Status: full  Family Communication: None at bedside  Disposition Plan:  SNF Monday    Consultants:  Ortho. Dr. Wynelle Link Procedures:  Pending revision planed for 09/08/13 Antibiotics:  Perioperative Vancomycin   HPI/Subjective:  Feels well.  Some cough productive of green sputum.  Denies SOB, wheeze, dysuria.  Voiding easily after catheter removed.     Objective: Filed Vitals:   09/11/13 2000 09/11/13 2029 09/12/13 0007 09/12/13 0536  BP:  111/49  104/48  Pulse:  99  95  Temp:  100.1 F (37.8 C) 99.7 F (37.6 C) 98.7 F (37.1 C)  TempSrc:  Oral  Oral  Resp: 18 18  18   Height:      Weight:      SpO2:  95%  96%    Intake/Output Summary (Last 24 hours) at 09/12/13 1225 Last data filed at 09/12/13 1055  Gross per 24 hour  Intake    960 ml  Output   1300 ml  Net   -340 ml   Filed Weights   09/06/13 2053  Weight: 66.679 kg (147 lb)    Exam:   General:  WF, No acute distress  HEENT:  NCAT, MMM  Cardiovascular:  RRR, nl S1, S2 no mrg, 2+ pulses, warm extremities  Respiratory:  CTAB, no increased WOB  Abdomen:   NABS, soft, NT/ND  MSK:   Normal tone and bulk upper extremities.  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  Left leg with periincisional edema.  Dressing c/d/i with small amount of bleeding.  No hematoma or large ecchymosis.  Mild subtle pinkness surrounding incision has resolved  Neuro:  Grossly intact  Data Reviewed: Basic Metabolic Panel:  Recent Labs Lab 09/06/13 1844 09/08/13 0457 09/09/13 0529 09/10/13 0532  NA 140 140 133* 139  K 4.1 3.9 4.9 3.9  CL 103 102 101 104  CO2 25 26 22 27   GLUCOSE 136* 97 193* 142*  BUN 15 16 14 13   CREATININE 0.70 0.80 0.75 0.68  CALCIUM 8.9 9.0 8.0* 8.5   Liver Function Tests: No results found for this basename: AST, ALT,  ALKPHOS, BILITOT, PROT, ALBUMIN,  in the last 168 hours No results found for this basename: LIPASE, AMYLASE,  in the last 168 hours No results found for this basename: AMMONIA,  in the last 168 hours CBC:  Recent Labs Lab 09/06/13 1844 09/08/13 0457 09/08/13 2113 09/09/13 0529 09/10/13 0532 09/11/13 0518  WBC 9.8 7.9  --  14.9* 18.7* 14.6*  NEUTROABS 5.6  --   --   --   --   --   HGB 10.6* 10.6* 10.9* 10.2* 7.7* 10.1*  HCT 33.0* 34.0* 32.5* 30.9* 23.0* 29.9*  MCV 91.7 93.2  --  87.3 88.5 86.9  PLT 565* 472*  --  344 280 255    Cardiac Enzymes: No results found for this basename: CKTOTAL, CKMB, CKMBINDEX, TROPONINI,  in the last 168 hours BNP (last 3 results) No results found for this basename: PROBNP,  in the last 8760 hours CBG: No results found for this basename: GLUCAP,  in the last 168 hours  Recent Results (from the past 240 hour(s))  SURGICAL PCR SCREEN     Status: Abnormal   Collection Time    09/06/13 10:12 PM      Result Value Ref Range Status   MRSA, PCR NEGATIVE  NEGATIVE Final   Staphylococcus aureus POSITIVE (*) NEGATIVE Final   Comment:            The Xpert SA Assay (FDA     approved for NASAL specimens     in patients over 4 years of age),     is one component of     a comprehensive surveillance     program.  Test performance has     been validated by [employer] for patients greater     than or equal to 90 year old.     It is not intended     to diagnose infection nor to     guide or monitor treatment.     Studies: No results found.  Scheduled Meds: . buPROPion  150 mg Oral BID  . busPIRone  10 mg Oral BID  . citalopram  10 mg Oral q morning - 10a  . cyclobenzaprine  10 mg Oral BID  . docusate sodium  100 mg Oral BID  . doxepin  10 mg Oral QHS  . esomeprazole  20 mg Oral BID  . iron polysaccharides  150 mg Oral BID  . loratadine  10 mg Oral Daily  . mupirocin ointment   Nasal BID  . oxybutynin  5 mg Oral Daily  . polyethylene glycol  17 g Oral BID  . rivaroxaban  10 mg Oral Q breakfast  . senna  1 tablet Oral BID   Continuous Infusions:    Active Problems:   Fibromyalgia   GERD (gastroesophageal reflux disease)   Postoperative anemia due to acute blood loss   Depression   Anxiety state, unspecified   Phantom limb (syndrome)   Obstructive sleep apnea   Transfusion history   Dislocation of hip joint prosthesis   HTN (hypertension)   Left hip pain   Femoral loosening of prosthetic left hip    Time spent: 30 min    Kamrie Fanton, [facility] Pager [phone]. If 7PM-7AM, please contact night-coverage at [email or web address], password [facility] 09/12/2013, 12:25 PM  LOS: 6 days

## 2013-09-12 NOTE — Progress Notes (Addendum)
New fever to 102.19F.  Had already ordered CXR and UA this AM.  CXR was negative, UA unremarkable.  Unclear source but clearly meets sepsis criteria with fever, leukocytosis.  Continue to closely examine wound for signs of infection.   -  Blood cultures x 2 -  Start vancomycin and aztreonam pending culture data -  Start florastor

## 2013-09-13 DIAGNOSIS — R6510 Systemic inflammatory response syndrome (SIRS) of non-infectious origin without acute organ dysfunction: Secondary | ICD-10-CM

## 2013-09-13 LAB — CBC WITH DIFFERENTIAL/PLATELET
Basophils Absolute: 0 10*3/uL (ref 0.0–0.1)
Basophils Relative: 0 % (ref 0–1)
Eosinophils Absolute: 0.6 10*3/uL (ref 0.0–0.7)
Eosinophils Relative: 5 % (ref 0–5)
HCT: 29.1 % — ABNORMAL LOW (ref 36.0–46.0)
Hemoglobin: 9.5 g/dL — ABNORMAL LOW (ref 12.0–15.0)
LYMPHS ABS: 1.8 10*3/uL (ref 0.7–4.0)
LYMPHS PCT: 14 % (ref 12–46)
MCH: 29.5 pg (ref 26.0–34.0)
MCHC: 32.6 g/dL (ref 30.0–36.0)
MCV: 90.4 fL (ref 78.0–100.0)
Monocytes Absolute: 2 10*3/uL — ABNORMAL HIGH (ref 0.1–1.0)
Monocytes Relative: 16 % — ABNORMAL HIGH (ref 3–12)
Neutro Abs: 8 10*3/uL — ABNORMAL HIGH (ref 1.7–7.7)
Neutrophils Relative %: 65 % (ref 43–77)
Platelets: 253 10*3/uL (ref 150–400)
RBC: 3.22 MIL/uL — AB (ref 3.87–5.11)
RDW: 14.8 % (ref 11.5–15.5)
WBC: 12.4 10*3/uL — AB (ref 4.0–10.5)

## 2013-09-13 MED ORDER — NYSTATIN 100000 UNIT/ML MT SUSP
5.0000 mL | Freq: Four times a day (QID) | OROMUCOSAL | Status: DC
  Administered 2013-09-13 – 2013-09-14 (×3): 500000 [IU] via ORAL
  Filled 2013-09-13 (×7): qty 5

## 2013-09-13 NOTE — Progress Notes (Signed)
Subjective: 5 Days Post-Op Procedure(s) (LRB): LEFT FEMORAL REVISION (Left) Patient reports pain as mild and moderate.   Patient seen in rounds with Dr. Wynelle Link. She has some bleeding from the incision. HGB was back up to 10.1 on the 29th.  Will recheck to make sure that it is stable. Elevated temp last night.  Check WBC. Patient is well, but has had some minor complaints of pain in the hip and thigh region, requiring pain medications Plan is to go Skilled nursing facility after hospital stay.  Objective: Vital signs in last 24 hours: Temp:  [98.6 F (37 C)-102.2 F (39 C)] 98.6 F (37 C) (08/31 0547) Pulse Rate:  [87-96] 87 (08/31 0547) Resp:  [16-18] 18 (08/31 0547) BP: (112-119)/(46-55) 112/55 mmHg (08/31 0547) SpO2:  [96 %-98 %] 98 % (08/31 0547)  Intake/Output from previous day:  Intake/Output Summary (Last 24 hours) at 09/13/13 4696 Last data filed at 09/13/13 0150  Gross per 24 hour  Intake   1660 ml  Output    700 ml  Net    960 ml    Labs:  Recent Labs  09/11/13 0518  HGB 10.1*    Recent Labs  09/11/13 0518  WBC 14.6*  RBC 3.44*  HCT 29.9*  PLT 255   No results found for this basename: NA, K, CL, CO2, BUN, CREATININE, GLUCOSE, CALCIUM,  in the last 72 hours No results found for this basename: LABPT, INR,  in the last 72 hours  EXAM General - Patient is Alert, Appropriate and Oriented Extremity - Neurovascular intact Sensation intact distally Dorsiflexion/Plantar flexion intact Dressing/Incision - some bleeding noted Motor Function - intact, moving foot and toes well on exam.   Past Medical History  Diagnosis Date  . Complication of anesthesia     NAUSEA  . Chest pain     pt states due to esophageal problem -  primary care MD aware   . Hives     since pneumonia vaccine   . Asthma   . Depression   . Anxiety   . Fibromyalgia   . Spondylosis   . Bulging disc   . 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 Phantom limb pain     rt  leg  . Leg pain, left   . Eczema     ears  . GERD (gastroesophageal reflux disease)     " alot of burping"  . Constipation, chronic   . Urgency incontinence   . History of transfusion   . Cancer     hx sarcoma rt knee with amputation  . Difficulty sleeping   . Sleep apnea     pt had recent sleep study done but does not know results - report called for and put on chart showing significant obstructive sleep apnea  . Hypertension   . Back pain 08/19/13    low back and  left leg pain due to nerve problems per pt  . PONV (postoperative nausea and vomiting)   . Peripheral neuropathy   . History of bronchitis   . History of pneumonia   . Hiatal hernia   . History of scarlet fever   . Postmenopausal   . History of measles   . History of mumps   . History of rubella     Assessment/Plan: 5 Days Post-Op Procedure(s) (LRB): LEFT FEMORAL REVISION (Left) Active Problems:   Fibromyalgia   GERD (gastroesophageal reflux disease)   Postoperative anemia  due to acute blood loss   Depression   Anxiety state, unspecified   Phantom limb (syndrome)   Obstructive sleep apnea   Transfusion history   Dislocation of hip joint prosthesis   HTN (hypertension)   Left hip pain   Femoral loosening of prosthetic left hip  Estimated body mass index is 26.88 kg/(m^2) as calculated from the following:   Height as of this encounter: 5\' 2"  (1.575 m).   Weight as of this encounter: 66.679 kg (147 lb). Up with therapy Recheck CBC this morning to check HGB ans WBC  DVT Prophylaxis - Xarelto Weight Bearing As Tolerated left Leg  Arlee Muslim, PA-C Orthopaedic Surgery 09/13/2013, 7:02 AM

## 2013-09-13 NOTE — Progress Notes (Addendum)
CARE MANAGEMENT NOTE 09/13/2013  Patient:  CHRISOULA, ZEGARRA   Account Number:  [id number]  Date Initiated:  09/07/2013  Documentation initiated by:  Jontavia Leatherbury  Subjective/Objective Assessment:   pt readmitted due to turning bed and dislocating newly revised hip prothesis     Action/Plan:   snf -[place]   Anticipated DC Date:  09/16/2013   Anticipated DC Plan:  SKILLED NURSING FACILITY  In-house referral  Clinical Social Worker      DC Planning Services  CM consult      Medical [facility] Choice  NA   Choice offered to / List presented to:  NA   DME arranged  NA      DME agency  NA     [place] arranged  NA      [place] agency  NA   Status of service:  In process, will continue to follow Medicare Important Message given?  NA - LOS <3 / Initial given by admissions (If response is "NO", the following Medicare IM given date fields will be blank) Date Medicare IM given:   Medicare IM given by:   Date Additional Medicare IM given:   Additional Medicare IM given by:    Discharge Disposition:    Per UR Regulation:  Reviewed for med. necessity/level of care/duration of stay  If discussed at [place] of Stay Meetings, dates discussed:   09/09/2013    Comments:  Suanne Marker Nardos Putnam,RN,BSN,CCM: [id number] still to go to snf post hospital stay/spiked temp on [id number] 102.2/wbc up to 12 4/cultures are pending. Corinna Capra, CCM [id number] is 2nd post op/ hgb 7.7 and getting prbc/up with P.T. only/plan is to retrun snf on Monday [id number] if hgb stabilizes.

## 2013-09-13 NOTE — Progress Notes (Signed)
Physical Therapy Treatment Patient Details Name: [name] MRN: [id number] DOB: 07-23-1950 Today's Date: 09/13/2013    History of Present Illness LEFT FEMORAL REVISION -Left.. Pt with history of RAKA.      PT Comments    Assisted pt OOB to [facility] to void.  Assisted with hygiene.  Asssited back to bed per pt request.  Declined to get into wc and declined to get into recliner due to c/o L hip pain and swelling.  Performed THR TE's L LE followed by ICE.  Pt progressing slowly and will need ST Rehab at SNF.  Follow Up Recommendations  SNF     Equipment Recommendations       Recommendations for Other Services       Precautions / Restrictions Precautions Precautions: Fall Precaution Comments: post precautions, R AKA without prosthesis Restrictions Weight Bearing Restrictions: No LLE Weight Bearing: Weight bearing as tolerated    Mobility  Bed Mobility Overal bed mobility: Needs Assistance Bed Mobility: Supine to Sit;Sit to Supine     Supine to sit: Mod assist Sit to supine: +2 for physical assistance;Max assist   General bed mobility comments: extra asssit due to pain level and 'cramps" in L groin.  Increased assist back to bed.  Transfers Overall transfer level: Needs assistance Equipment used: None Transfers: Sit to/from [facility] Sit to Stand: +2 physical assistance;+2 safety/equipment;Max assist Stand pivot transfers: Max assist;+2 physical assistance       General transfer comment: Assisted pt OOb to [facility] 1/4 pivot turn to pt's L with 50% VC's on proper tech and hand placement plus 75% VC's on THP.  Back to bed assist using "bear hug" 1/4 squat pivot increased time to adhere to THP.  Pt declined any other OOB activity due to  pain level and c/o hip swelling.  Ambulation/Gait                 Stairs            Wheelchair Mobility    Modified Rankin (Stroke Patients Only)       Balance                                     Cognition                            Exercises   L Hip TE's 10 reps ankle pumps 10 reps knee presses 10 reps heel slides 10 reps SAQ's 10 reps ABD Followed by ICE     General Comments  pt still receiving IV antibiotics      Pertinent Vitals/Pain      Home Living                      Prior Function            PT Goals (current goals can now be found in the care plan section) Progress towards PT goals: Progressing toward goals    Frequency  Min 5X/week    PT Plan      Co-evaluation             End of Session   Activity Tolerance: Patient tolerated treatment well Patient left: in bed;with call bell/phone within reach     Time: 1335-1400 PT Time Calculation (min): 25 min  Charges:  $Therapeutic Exercise: 8-22 mins $Therapeutic Activity:  8-22 mins                    G Codes:      Rica Koyanagi  PTA [facility]  Acute  Rehab Pager      [phone]

## 2013-09-13 NOTE — Progress Notes (Signed)
[facility] PROGRESS NOTE  [name] EAV:[id number] DOB: [birth year] DOA: 09/06/2013 PCP: Shary Key, MD  Assessment/Plan  SIRS, concern for sepsis secondary to possible wound infection -  Repeat CXR and UA negative -  Blood cultures pending -  Continue vancomycin and aztreonam -  Trend creatinine on ABX  Left leg hip prosthesis dislocation s/p left hip prosthesis revision on 8/26 by Dr. Wynelle Link -  DVT proph with xarelto -  Weight bearing status per orthopedics  Leukocytosis, trending down  - continue abx as above - repeat WBC in AM  Acute blood loss anemia from recent surgery superimposed on iron deficiency and chronic disease.  hgb improved post transfusion. -  Continue iron supplements.   -  Transfused 2 units PRBC intraoperatively on 8/26. -  Transfused 1 unit PRBC 8/28  Acute hypoxic respiratory failure likely due to atelectasis, routine use post-operative, resolved. HTN (hypertension), blood pressure low normal.  Continue to hold irbesartan. Fibromyalgia, continue prn dilaudid and celexa GERD (gastroesophageal reflux disease), stable, continue nexium  Depression/Anxiety, stable, continue wellbutrin, celexa, and buspar, and continue increased frequency of prn ativan.  Needs close follow up with primary psychiatrist Pruritis with history of hives being managed by dermatologist, improved.  Continue loratadine, prn benadryl, and doxepin  Hyponatremia, may be due to dehydration post large surgery, mild and asymptomatic, resolved. -  Repeat BMP in AM  Thrombocytosis, reactive from femur fracture and resolved.  Diet:  regular Access:  PIV IVF:  yes Proph: xarelto  Code Status: full  Family Communication: None at bedside  Disposition Plan:  Continue to monitor for signs of infection.  Continue abx until source identified and/or blood cultures negative x 48 hours   Consultants:  Ortho. Dr. Wynelle Link Procedures:  Pending revision planed for  09/08/13 Antibiotics:  Perioperative Vancomycin Vancomycin 8/30 >> Aztreonam 8/30 >>   HPI/Subjective:  Feels tired and continues to have severe pain in the left leg.  Left incision is bleeding/oozing.  Denies SOB, wheeze, dysuria.    Objective: Filed Vitals:   09/12/13 1612 09/12/13 2059 09/13/13 0547 09/13/13 1300  BP:  119/48 112/55 98/86  Pulse:  89 87 95  Temp: 99.6 F (37.6 C) 99 F (37.2 C) 98.6 F (37 C) 100.1 F (37.8 C)  TempSrc: Oral Oral Oral Oral  Resp:  17 18 18   Height:      Weight:      SpO2:  98% 98% 99%    Intake/Output Summary (Last 24 hours) at 09/13/13 1522 Last data filed at 09/13/13 1342  Gross per 24 hour  Intake   1660 ml  Output    800 ml  Net    860 ml   Filed Weights   09/06/13 2053  Weight: 66.679 kg (147 lb)    Exam:   General:  WF, No acute distress  HEENT:  NCAT, MMM  Cardiovascular:  RRR, nl S1, S2 no mrg, 2+ pulses, warm extremities  Respiratory:  CTAB, no increased WOB  Abdomen:   NABS, soft, NT/ND  MSK:   Normal tone and bulk upper extremities.  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  Left leg with periincisional edema, warm to touch, mildly erythematous surrounding length of incision, oozing serosanguinous/slightly gray material.    Neuro:  Grossly intact  Data Reviewed: Basic Metabolic Panel:  Recent Labs Lab 09/06/13 1844 09/08/13 0457 09/09/13 0529 09/10/13 0532  NA 140 140 133* 139  K 4.1 3.9 4.9 3.9  CL 103 102 101 104  CO2 25  26 22 27   GLUCOSE 136* 97 193* 142*  BUN 15 16 14 13   CREATININE 0.70 0.80 0.75 0.68  CALCIUM 8.9 9.0 8.0* 8.5   Liver Function Tests: No results found for this basename: AST, ALT, ALKPHOS, BILITOT, PROT, ALBUMIN,  in the last 168 hours No results found for this basename: LIPASE, AMYLASE,  in the last 168 hours No results found for this basename: AMMONIA,  in the last 168 hours CBC:  Recent Labs Lab 09/06/13 1844 09/08/13 0457 09/08/13 2113 09/09/13 0529  09/10/13 0532 09/11/13 0518 09/13/13 0745  WBC 9.8 7.9  --  14.9* 18.7* 14.6* 12.4*  NEUTROABS 5.6  --   --   --   --   --  8.0*  HGB 10.6* 10.6* 10.9* 10.2* 7.7* 10.1* 9.5*  HCT 33.0* 34.0* 32.5* 30.9* 23.0* 29.9* 29.1*  MCV 91.7 93.2  --  87.3 88.5 86.9 90.4  PLT 565* 472*  --  344 280 255 253   Cardiac Enzymes: No results found for this basename: CKTOTAL, CKMB, CKMBINDEX, TROPONINI,  in the last 168 hours BNP (last 3 results) No results found for this basename: PROBNP,  in the last 8760 hours CBG: No results found for this basename: GLUCAP,  in the last 168 hours  Recent Results (from the past 240 hour(s))  SURGICAL PCR SCREEN     Status: Abnormal   Collection Time    09/06/13 10:12 PM      Result Value Ref Range Status   MRSA, PCR NEGATIVE  NEGATIVE Final   Staphylococcus aureus POSITIVE (*) NEGATIVE Final   Comment:            The Xpert SA Assay (FDA     approved for NASAL specimens     in patients over 11 years of age),     is one component of     a comprehensive surveillance     program.  Test performance has     been validated by [employer] for patients greater     than or equal to 81 year old.     It is not intended     to diagnose infection nor to     guide or monitor treatment.     Studies: Dg Chest Port 1 View  09/12/2013   CLINICAL DATA:  63 year old female with fever, cough and congestion.  EXAM: PORTABLE CHEST - 1 VIEW  COMPARISON:  09/10/2013 and prior radiographs dating back to 07/12/2013.  FINDINGS: The cardiomediastinal silhouette is unremarkable.  There is no evidence of focal airspace disease, pulmonary edema, suspicious pulmonary nodule/mass, pleural effusion, or pneumothorax. No acute bony abnormalities are identified.  IMPRESSION: No active disease.   Electronically Signed   By: Hassan Rowan M.D.   On: 09/12/2013 13:46    Scheduled Meds: . aztreonam  2 g Intravenous 3 times per day  . buPROPion  150 mg Oral BID  . busPIRone  10 mg Oral BID  .  citalopram  10 mg Oral q morning - 10a  . cyclobenzaprine  10 mg Oral BID  . docusate sodium  100 mg Oral BID  . doxepin  10 mg Oral QHS  . esomeprazole  20 mg Oral BID  . iron polysaccharides  150 mg Oral BID  . loratadine  10 mg Oral Daily  . mupirocin ointment   Nasal BID  . nystatin  5 mL Oral QID  . oxybutynin  5 mg Oral Daily  . polyethylene  glycol  17 g Oral BID  . rivaroxaban  10 mg Oral Q breakfast  . saccharomyces boulardii  250 mg Oral BID  . senna  1 tablet Oral BID  . vancomycin  750 mg Intravenous Q12H   Continuous Infusions:    Active Problems:   Fibromyalgia   GERD (gastroesophageal reflux disease)   Postoperative anemia due to acute blood loss   Depression   Anxiety state, unspecified   Phantom limb (syndrome)   Obstructive sleep apnea   Transfusion history   Dislocation of hip joint prosthesis   HTN (hypertension)   Left hip pain   Femoral loosening of prosthetic left hip    Time spent: 30 min    Marda Breidenbach, [place] [facility] Pager [phone]. If 7PM-7AM, please contact night-coverage at [email or web address], password [facility] 09/13/2013, 3:22 PM  LOS: 7 days

## 2013-09-14 LAB — BASIC METABOLIC PANEL
Anion gap: 10 (ref 5–15)
BUN: 10 mg/dL (ref 6–23)
CO2: 26 mEq/L (ref 19–32)
Calcium: 8.5 mg/dL (ref 8.4–10.5)
Chloride: 99 mEq/L (ref 96–112)
Creatinine, Ser: 0.67 mg/dL (ref 0.50–1.10)
GFR calc Af Amer: >90 mL/min (ref >90)
GLUCOSE: 105 mg/dL — AB (ref 70–99)
POTASSIUM: 3.7 meq/L (ref 3.7–5.3)
Sodium: 135 mEq/L — ABNORMAL LOW (ref 137–147)

## 2013-09-14 LAB — CBC
HCT: 27.5 % — ABNORMAL LOW (ref 36.0–46.0)
Hemoglobin: 9.2 g/dL — ABNORMAL LOW (ref 12.0–15.0)
MCH: 29.5 pg (ref 26.0–34.0)
MCHC: 33.5 g/dL (ref 30.0–36.0)
MCV: 88.1 fL (ref 78.0–100.0)
PLATELETS: 297 10*3/uL (ref 150–400)
RBC: 3.12 MIL/uL — AB (ref 3.87–5.11)
RDW: 14.3 % (ref 11.5–15.5)
WBC: 11.4 10*3/uL — AB (ref 4.0–10.5)

## 2013-09-14 MED ORDER — POLYETHYLENE GLYCOL 3350 17 G PO PACK
17.0000 g | PACK | Freq: Every day | ORAL | Status: DC
  Administered 2013-09-17 – 2013-09-20 (×3): 17 g via ORAL
  Filled 2013-09-14: qty 1

## 2013-09-14 MED ORDER — MAGIC MOUTHWASH W/LIDOCAINE
5.0000 mL | Freq: Four times a day (QID) | ORAL | Status: DC | PRN
  Filled 2013-09-14: qty 5

## 2013-09-14 MED ORDER — ASPIRIN EC 325 MG PO TBEC
325.0000 mg | DELAYED_RELEASE_TABLET | Freq: Every day | ORAL | Status: DC
  Administered 2013-09-14 – 2013-09-21 (×7): 325 mg via ORAL
  Filled 2013-09-14 (×8): qty 1

## 2013-09-14 NOTE — Progress Notes (Signed)
Subjective: 6 Days Post-Op Procedure(s) (LRB): LEFT FEMORAL REVISION (Left) Patient reports pain as moderate when she got up on the leg for stand and pivot. Patient seen in rounds with Dr. Wynelle Link. Patient is having problems with pain in the hip, requiring pain medications Plan is to go Skilled nursing facility after hospital stay.  Objective: Vital signs in last 24 hours: Temp:  [98.5 F (36.9 C)-100.1 F (37.8 C)] 98.5 F (36.9 C) (09/01 0700) Pulse Rate:  [86-95] 86 (09/01 0700) Resp:  [18] 18 (09/01 0700) BP: (98-111)/(48-86) 99/53 mmHg (09/01 0700) SpO2:  [96 %-99 %] 99 % (09/01 0700)  Intake/Output from previous day:  Intake/Output Summary (Last 24 hours) at 09/14/13 0751 Last data filed at 09/14/13 0744  Gross per 24 hour  Intake   1340 ml  Output   1100 ml  Net    240 ml    Intake/Output this shift: Total I/O In: 240 [P.O.:240] Out: -   Labs:  Recent Labs  09/13/13 0745 09/14/13 0520  HGB 9.5* 9.2*    Recent Labs  09/13/13 0745 09/14/13 0520  WBC 12.4* 11.4*  RBC 3.22* 3.12*  HCT 29.1* 27.5*  PLT 253 297    Recent Labs  09/14/13 0520  NA 135*  K 3.7  CL 99  CO2 26  BUN 10  CREATININE 0.67  GLUCOSE 105*  CALCIUM 8.5   No results found for this basename: LABPT, INR,  in the last 72 hours  EXAM General - Patient is Alert, Appropriate and Oriented Extremity - Neurovascular intact Sensation intact distally Dorsiflexion/Plantar flexion intact Compartment soft to thigh area Dressing/Incision - surrounding erythema noted but unchanged from yesterday, serosanguinous drainage from mid and proximal incision area, mild swelling noted in the proximal incisional area which is likely due to hematoma.  Currently on Xarelto and will discontinue this medication.   Motor Function - intact, moving foot and toes well on exam.   Past Medical History  Diagnosis Date  . Complication of anesthesia     NAUSEA  . Chest pain     pt states due to  esophageal problem -  primary care MD aware   . Hives     since pneumonia vaccine   . Asthma   . Depression   . Anxiety   . Fibromyalgia   . Spondylosis   . Bulging disc   . 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 Phantom limb pain     rt leg  . Leg pain, left   . Eczema     ears  . GERD (gastroesophageal reflux disease)     " alot of burping"  . Constipation, chronic   . Urgency incontinence   . History of transfusion   . Cancer     hx sarcoma rt knee with amputation  . Difficulty sleeping   . Sleep apnea     pt had recent sleep study done but does not know results - report called for and put on chart showing significant obstructive sleep apnea  . Hypertension   . Back pain 08/19/13    low back and  left leg pain due to nerve problems per pt  . PONV (postoperative nausea and vomiting)   . Peripheral neuropathy   . History of bronchitis   . History of pneumonia   . Hiatal hernia   . History of scarlet fever   . Postmenopausal   . History of measles   .  History of mumps   . History of rubella     Assessment/Plan: 6 Days Post-Op Procedure(s) (LRB): LEFT FEMORAL REVISION (Left) Active Problems:   Fibromyalgia   GERD (gastroesophageal reflux disease)   Postoperative anemia due to acute blood loss   Depression   Anxiety state, unspecified   Phantom limb (syndrome)   Obstructive sleep apnea   Hyponatremia   Transfusion history   Dislocation of hip joint prosthesis   HTN (hypertension)   Left hip pain   Femoral loosening of prosthetic left hip   SIRS (systemic inflammatory response syndrome)  Estimated body mass index is 26.88 kg/(m^2) as calculated from the following:   Height as of this encounter: 5\' 2"  (1.575 m).   Weight as of this encounter: 66.679 kg (147 lb).  Up with therapy Recheck labs in AM WBC is slightly better today at 11.4, HGB stable at 9.2 Likely hematoma in hip region. Will DC Xarelto and switch to 325 mg Aspirin daily Continue to  monitor and may need to be washed out formally in the OR tomorrow if worsens DVT Prophylaxis - Aspirin Weight Bearing As Tolerated left Leg  Arlee Muslim, PA-C Orthopaedic Surgery 09/14/2013, 7:51 AM   .

## 2013-09-14 NOTE — Progress Notes (Signed)
OT Cancellation Note  Patient Details Name: [name] MRN: [id number] DOB: [birth year]   Cancelled Treatment:    Reason Eval/Treat Not Completed: Pain limiting ability to participate. Pt has completed ADL.  Will try to check back later today if schedule permits.    Lonzo Saulter 09/14/2013, 11:11 AM Lesle Chris, OTR/L [phone] 09/14/2013

## 2013-09-14 NOTE — Progress Notes (Addendum)
[facility] PROGRESS NOTE  [name]:[id number] DOB: Sep 09, 1950 DOA: 09/06/2013 PCP: Shary Key, MD  Brief Summary  63 year-old female with history of hypertension, GERD, right lower extremity AKA due to sarcoma, depression and anxiety who was recently admitted for left periprosthetic hip fracture.  She was in rehabilitation 4 days prior to admission when she experienced a muscle pull-like feeling while getting on the bedpan.  She had persistent pain and was found to have a dislocation of her prosthesis.  She underwent repair on 8/26, however, her post-operative course has been complicated by SIRS and possible wound infection.  Fever resolving on vancomycin and aztreonam.  No pneumonia or UTI to explain symptoms.  If wound appearing infected, Dr. Wynelle Link may take back to OR on Wed for washout.    Assessment/Plan  SIRS, concern for sepsis secondary to possible wound infection -  Repeat CXR and UA negative -  Blood cultures NGTD -  Continue vancomycin and aztreonam -  Trend creatinine on ABX -  Will continue to reevaluate incision/wound on daily basis  Left leg hip prosthesis dislocation s/p left hip prosthesis revision on 8/26 by Dr. Wynelle Link -  DVT proph with xarelto -  Weight bearing status per orthopedics  Leukocytosis, trending down  - continue abx as above - repeat WBC in AM  Acute blood loss anemia from recent surgery superimposed on iron deficiency and chronic disease.  hgb improved post transfusion. -  Continue iron supplements.   -  Transfused 2 units PRBC intraoperatively on 8/26. -  Transfused 1 unit PRBC 8/28  Acute hypoxic respiratory failure likely due to atelectasis, routine use post-operative, resolved. HTN (hypertension), blood pressure low normal.  Continue to hold irbesartan. Fibromyalgia, continue prn dilaudid and celexa GERD (gastroesophageal reflux disease), stable, continue nexium  Depression/Anxiety, stable, continue wellbutrin, celexa, and  buspar, and continue increased frequency of prn ativan.  Needs close follow up with primary psychiatrist Pruritis with history of hives being managed by dermatologist, improved.  Continue loratadine, prn benadryl, and doxepin  Hyponatremia, may be due to dehydration post large surgery, mild and asymptomatic.   -  Repeat in AM  Thrombocytosis, reactive from femur fracture and resolved.  Diet:  regular Access:  PIV IVF:  yes Proph: xarelto  Code Status: full  Family Communication: None at bedside  Disposition Plan:  Continue to monitor for signs of infection.  Continue abx until source identified and/or blood cultures negative x 48 hours   Consultants:  Ortho. Dr. Wynelle Link Procedures:  Left femoral revision on  09/08/13 Antibiotics:  Perioperative Vancomycin Vancomycin 8/30 >> Aztreonam 8/30 >>   HPI/Subjective:  Feels tired and continues to have severe pain in the left leg.  Does not want to get out of bed for rehab.  Left incision leaking serosanguinous drainage.  Denies SOB, wheeze, dysuria.    Objective: Filed Vitals:   09/13/13 0547 09/13/13 1300 09/13/13 2236 09/14/13 0700  BP: 112/55 98/86 111/48 99/53  Pulse: 87 95 95 86  Temp: 98.6 F (37 C) 100.1 F (37.8 C) 99 F (37.2 C) 98.5 F (36.9 C)  TempSrc: Oral Oral Oral Oral  Resp: 18 18 18 18   Height:      Weight:      SpO2: 98% 99% 96% 99%    Intake/Output Summary (Last 24 hours) at 09/14/13 0843 Last data filed at 09/14/13 0744  Gross per 24 hour  Intake   1340 ml  Output   1100 ml  Net    240  ml   Filed Weights   09/06/13 2053  Weight: 66.679 kg (147 lb)    Exam:   General:  WF, No acute distress, somewhat tearful today  HEENT:  NCAT, MMM  Cardiovascular:  RRR, nl S1, S2 no mrg, 2+ pulses, warm extremities  Respiratory:  CTAB, no increased WOB  Abdomen:   NABS, soft, NT/ND  MSK:   Normal tone and bulk upper extremities.  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  Left leg with  periincisional edema, warm to touch, mildly erythematous surrounding length of incision, oozing serosanguinous/slightly gray material.    Neuro:  Grossly intact  Data Reviewed: Basic Metabolic Panel:  Recent Labs Lab 09/08/13 0457 09/09/13 0529 09/10/13 0532 09/14/13 0520  NA 140 133* 139 135*  K 3.9 4.9 3.9 3.7  CL 102 101 104 99  CO2 26 22 27 26   GLUCOSE 97 193* 142* 105*  BUN 16 14 13 10   CREATININE 0.80 0.75 0.68 0.67  CALCIUM 9.0 8.0* 8.5 8.5   Liver Function Tests: No results found for this basename: AST, ALT, ALKPHOS, BILITOT, PROT, ALBUMIN,  in the last 168 hours No results found for this basename: LIPASE, AMYLASE,  in the last 168 hours No results found for this basename: AMMONIA,  in the last 168 hours CBC:  Recent Labs Lab 09/09/13 0529 09/10/13 0532 09/11/13 0518 09/13/13 0745 09/14/13 0520  WBC 14.9* 18.7* 14.6* 12.4* 11.4*  NEUTROABS  --   --   --  8.0*  --   HGB 10.2* 7.7* 10.1* 9.5* 9.2*  HCT 30.9* 23.0* 29.9* 29.1* 27.5*  MCV 87.3 88.5 86.9 90.4 88.1  PLT 344 280 255 253 297   Cardiac Enzymes: No results found for this basename: CKTOTAL, CKMB, CKMBINDEX, TROPONINI,  in the last 168 hours BNP (last 3 results) No results found for this basename: PROBNP,  in the last 8760 hours CBG: No results found for this basename: GLUCAP,  in the last 168 hours  Recent Results (from the past 240 hour(s))  SURGICAL PCR SCREEN     Status: Abnormal   Collection Time    09/06/13 10:12 PM      Result Value Ref Range Status   MRSA, PCR NEGATIVE  NEGATIVE Final   Staphylococcus aureus POSITIVE (*) NEGATIVE Final   Comment:            The Xpert SA Assay (FDA     approved for NASAL specimens     in patients over 33 years of age),     is one component of     a comprehensive surveillance     program.  Test performance has     been validated by [employer] for patients greater     than or equal to 56 year old.     It is not intended     to diagnose  infection nor to     guide or monitor treatment.  URINE CULTURE     Status: None   Collection Time    09/12/13  3:03 PM      Result Value Ref Range Status   Specimen Description URINE, CLEAN CATCH   Final   Special Requests NONE   Final   Culture  Setup Time     Final   Value: 09/13/2013 02:43     Performed at [facility]     Final   Value: 40,000 COLONIES/ML     Performed  at [facility]   Culture     Final   Value: [place]     Performed at [facility]   Report Status PENDING   Incomplete  CULTURE, BLOOD (ROUTINE X 2)     Status: None   Collection Time    09/12/13  3:44 PM      Result Value Ref Range Status   Specimen Description BLOOD LEFT ARM   Final   Special Requests     Final   Value: BOTTLES DRAWN AEROBIC AND ANAEROBIC 10CC BOTH BOTTLES   Culture  Setup Time     Final   Value: 09/13/2013 01:20     Performed at [facility]   Culture     Final   Value:        BLOOD CULTURE RECEIVED NO GROWTH TO DATE CULTURE WILL BE HELD FOR 5 DAYS BEFORE ISSUING A FINAL NEGATIVE REPORT     Performed at [facility]   Report Status PENDING   Incomplete  CULTURE, BLOOD (ROUTINE X 2)     Status: None   Collection Time    09/12/13  4:34 PM      Result Value Ref Range Status   Specimen Description BLOOD LEFT ARM   Final   Special Requests     Final   Value: BOTTLES DRAWN AEROBIC AND ANAEROBIC 10CC BOTH BOTTLES   Culture  Setup Time     Final   Value: 09/13/2013 01:20     Performed at [facility]   Culture     Final   Value:        BLOOD CULTURE RECEIVED NO GROWTH TO DATE CULTURE WILL BE HELD FOR 5 DAYS BEFORE ISSUING A FINAL NEGATIVE REPORT     Performed at [facility]   Report Status PENDING   Incomplete     Studies: Dg Chest Port 1 View  09/12/2013   CLINICAL DATA:  63 year old female with fever, cough and congestion.  EXAM: PORTABLE CHEST - 1 VIEW  COMPARISON:  09/10/2013 and prior radiographs  dating back to 07/12/2013.  FINDINGS: The cardiomediastinal silhouette is unremarkable.  There is no evidence of focal airspace disease, pulmonary edema, suspicious pulmonary nodule/mass, pleural effusion, or pneumothorax. No acute bony abnormalities are identified.  IMPRESSION: No active disease.   Electronically Signed   By: Hassan Rowan M.D.   On: 09/12/2013 13:46    Scheduled Meds: . aspirin EC  325 mg Oral Daily  . aztreonam  2 g Intravenous 3 times per day  . buPROPion  150 mg Oral BID  . busPIRone  10 mg Oral BID  . citalopram  10 mg Oral q morning - 10a  . cyclobenzaprine  10 mg Oral BID  . docusate sodium  100 mg Oral BID  . doxepin  10 mg Oral QHS  . esomeprazole  20 mg Oral BID  . iron polysaccharides  150 mg Oral BID  . loratadine  10 mg Oral Daily  . mupirocin ointment   Nasal BID  . nystatin  5 mL Oral QID  . oxybutynin  5 mg Oral Daily  . polyethylene glycol  17 g Oral BID  . saccharomyces boulardii  250 mg Oral BID  . senna  1 tablet Oral BID  . vancomycin  750 mg Intravenous Q12H   Continuous Infusions:    Active Problems:   Fibromyalgia   GERD (gastroesophageal reflux disease)   Postoperative anemia due to acute  blood loss   Depression   Anxiety state, unspecified   Phantom limb (syndrome)   Obstructive sleep apnea   Hyponatremia   Transfusion history   Dislocation of hip joint prosthesis   HTN (hypertension)   Left hip pain   Femoral loosening of prosthetic left hip   SIRS (systemic inflammatory response syndrome)    Time spent: 30 min    Ailed Defibaugh, [place] [facility] Pager [phone]. If 7PM-7AM, please contact night-coverage at [email or web address], password [facility] 09/14/2013, 8:43 AM  LOS: 8 days

## 2013-09-14 NOTE — Progress Notes (Signed)
CSW assisting with d/c planning. Pt remains medically unstable and not ready for d/c. [facility] no longer has availability . Pt / family have chosen [facility] for [facility]. [employer] has been updated . CSW has been instructed by [employer] to contact rep Edison Nasuti [phone] )  when pt is ready for d/c to review status for authorization. CSW will continue to follow to assist with d/c planning and ongoing support/reassurance.  Werner Lean LCSW [phone]

## 2013-09-14 NOTE — Progress Notes (Signed)
PT Cancellation Note  Patient Details Name: [name] MRN: [id number] DOB: 24-Nov-1950   Cancelled Treatment:    Reason Eval/Treat Not Completed: Pain limiting ability to participate (pt emotional about possibility of return to surgery tomorrow.)   Claretha Cooper 09/14/2013, 12:25 PM Tresa Endo PT [phone]

## 2013-09-14 NOTE — Progress Notes (Signed)
Physical Therapy Treatment Patient Details Name: JYRAH BLYE MRN: [id number] DOB: 1950/11/23 Today's Date: 09/14/2013    History of Present Illness LEFT FEMORAL REVISION -Left.. Pt with history of RAKA.      PT Comments    RN notified PT that patient did want to work on bedside mobility today. Patient tolerated well. Very motivated. Expresses concern for swelling in L hip. For possible return to OR for I/D .  Follow Up Recommendations  SNF     Equipment Recommendations  None recommended by PT    Recommendations for Other Services       Precautions / Restrictions Precautions Precautions: Fall Precaution Comments: post precautions, R AKA without prosthesis Restrictions LLE Weight Bearing: Weight bearing as tolerated    Mobility  Bed Mobility Overal bed mobility: Needs Assistance Bed Mobility: Supine to Sit;Sit to Supine     Supine to sit: Mod assist Sit to supine: Mod assist   General bed mobility comments: extra asssit due to pain level and 'cramps" in L groin.  Increased assist back to bed.  Transfers                    Ambulation/Gait                 Stairs            Wheelchair Mobility    Modified Rankin (Stroke Patients Only)       Balance Overall balance assessment: Needs assistance Sitting-balance support: Feet supported;Bilateral upper extremity supported Sitting balance-Leahy Scale: Poor Sitting balance - Comments: Pt having more imbalance today at edge bed. relies heavily on her UE's. Sat  at edge x20 minutes                            Cognition Arousal/Alertness: Awake/alert                          Exercises      General Comments        Pertinent Vitals/Pain Pain Score: 6  Pain Location: L hip Pain Descriptors / Indicators: Tightness Pain Intervention(s): Limited activity within patient's tolerance;Monitored during session;Premedicated before session;Ice applied    Home Living                       Prior Function            PT Goals (current goals can now be found in the care plan section) Progress towards PT goals: Progressing toward goals    Frequency  Min 5X/week    PT Plan Current plan remains appropriate    Co-evaluation             End of Session   Activity Tolerance: Patient tolerated treatment well Patient left: in bed;with call bell/phone within reach     Time: 1444-1514 PT Time Calculation (min): 30 min  Charges:  $Therapeutic Activity: 23-37 mins                    G Codes:      Claretha Cooper 09/14/2013, 5:16 PM Tresa Endo PT [phone]

## 2013-09-15 ENCOUNTER — Encounter (HOSPITAL_COMMUNITY)
Admission: EM | Disposition: A | Discharge status: Skilled Nursing Facility | Payer: Self-pay | Source: Home / Self Care | Attending: Internal Medicine

## 2013-09-15 ENCOUNTER — Inpatient Hospital Stay (HOSPITAL_COMMUNITY): Payer: BC Managed Care – PPO | Admitting: Certified Registered"

## 2013-09-15 ENCOUNTER — Encounter (HOSPITAL_COMMUNITY): Payer: BC Managed Care – PPO | Admitting: Certified Registered"

## 2013-09-15 DIAGNOSIS — Z5189 Encounter for other specified aftercare: Secondary | ICD-10-CM

## 2013-09-15 DIAGNOSIS — IMO0001 Reserved for inherently not codable concepts without codable children: Secondary | ICD-10-CM

## 2013-09-15 HISTORY — PX: INCISION AND DRAINAGE HIP: SHX1801

## 2013-09-15 LAB — VANCOMYCIN, TROUGH: Vancomycin Tr: 8.4 ug/mL — ABNORMAL LOW (ref 10.0–20.0)

## 2013-09-15 LAB — CBC
HEMATOCRIT: 27.7 % — AB (ref 36.0–46.0)
HEMOGLOBIN: 9 g/dL — AB (ref 12.0–15.0)
MCH: 29.3 pg (ref 26.0–34.0)
MCHC: 32.5 g/dL (ref 30.0–36.0)
MCV: 90.2 fL (ref 78.0–100.0)
Platelets: 333 10*3/uL (ref 150–400)
RBC: 3.07 MIL/uL — AB (ref 3.87–5.11)
RDW: 14.2 % (ref 11.5–15.5)
WBC: 11.1 10*3/uL — AB (ref 4.0–10.5)

## 2013-09-15 LAB — BASIC METABOLIC PANEL
ANION GAP: 11 (ref 5–15)
BUN: 11 mg/dL (ref 6–23)
CHLORIDE: 102 meq/L (ref 96–112)
CO2: 26 meq/L (ref 19–32)
Calcium: 8.5 mg/dL (ref 8.4–10.5)
Creatinine, Ser: 0.62 mg/dL (ref 0.50–1.10)
GFR calc Af Amer: >90 mL/min (ref >90)
GFR calc non Af Amer: >90 mL/min (ref >90)
Glucose, Bld: 103 mg/dL — ABNORMAL HIGH (ref 70–99)
POTASSIUM: 3.9 meq/L (ref 3.7–5.3)
Sodium: 139 mEq/L (ref 137–147)

## 2013-09-15 LAB — URINE CULTURE: Colony Count: 40000

## 2013-09-15 SURGERY — IRRIGATION AND DEBRIDEMENT HIP
Anesthesia: General | Site: Hip | Laterality: Left

## 2013-09-15 MED ORDER — DEXAMETHASONE SODIUM PHOSPHATE 10 MG/ML IJ SOLN
INTRAMUSCULAR | Status: AC
  Filled 2013-09-15: qty 1

## 2013-09-15 MED ORDER — HYDROMORPHONE HCL PF 1 MG/ML IJ SOLN: 0.2500 mg | INTRAMUSCULAR | Status: DC | PRN

## 2013-09-15 MED ORDER — FENTANYL CITRATE 0.05 MG/ML IJ SOLN
INTRAMUSCULAR | Status: AC
  Filled 2013-09-15: qty 2

## 2013-09-15 MED ORDER — SODIUM CHLORIDE 0.9 % IV SOLN
INTRAVENOUS | Status: DC
  Administered 2013-09-15 – 2013-09-17 (×3): via INTRAVENOUS
  Administered 2013-09-19: 20 mL/h via INTRAVENOUS

## 2013-09-15 MED ORDER — MEPERIDINE HCL 25 MG/ML IJ SOLN: 6.2500 mg | INTRAMUSCULAR | Status: DC | PRN

## 2013-09-15 MED ORDER — LACTATED RINGERS IV SOLN: INTRAVENOUS | Status: DC

## 2013-09-15 MED ORDER — SUCCINYLCHOLINE CHLORIDE 20 MG/ML IJ SOLN
INTRAMUSCULAR | Status: DC | PRN
  Administered 2013-09-15: 100 mg via INTRAVENOUS

## 2013-09-15 MED ORDER — OXYCODONE HCL 5 MG PO TABS: 5.0000 mg | ORAL_TABLET | Freq: Once | ORAL | Status: DC | PRN

## 2013-09-15 MED ORDER — FENTANYL CITRATE 0.05 MG/ML IJ SOLN
INTRAMUSCULAR | Status: DC | PRN
Start: 2013-09-15 — End: 2013-09-15
  Administered 2013-09-15 (×2): 50 ug via INTRAVENOUS

## 2013-09-15 MED ORDER — PROMETHAZINE HCL 25 MG/ML IJ SOLN: 6.2500 mg | INTRAMUSCULAR | Status: DC | PRN

## 2013-09-15 MED ORDER — DEXAMETHASONE SODIUM PHOSPHATE 10 MG/ML IJ SOLN
INTRAMUSCULAR | Status: DC | PRN
  Administered 2013-09-15: 10 mg via INTRAVENOUS

## 2013-09-15 MED ORDER — VANCOMYCIN HCL 10 G IV SOLR
1250.0000 mg | Freq: Two times a day (BID) | INTRAVENOUS | Status: DC
  Administered 2013-09-16 – 2013-09-18 (×5): 1250 mg via INTRAVENOUS
  Filled 2013-09-15 (×6): qty 1250

## 2013-09-15 MED ORDER — PROPOFOL 10 MG/ML IV BOLUS
INTRAVENOUS | Status: DC | PRN
  Administered 2013-09-15: 150 mg via INTRAVENOUS

## 2013-09-15 MED ORDER — LIDOCAINE HCL (CARDIAC) 20 MG/ML IV SOLN
INTRAVENOUS | Status: AC
  Filled 2013-09-15: qty 5

## 2013-09-15 MED ORDER — ONDANSETRON HCL 4 MG/2ML IJ SOLN
INTRAMUSCULAR | Status: DC | PRN
  Administered 2013-09-15: 4 mg via INTRAVENOUS

## 2013-09-15 MED ORDER — OXYCODONE HCL 5 MG/5ML PO SOLN: 5.0000 mg | Freq: Once | ORAL | Status: DC | PRN

## 2013-09-15 MED ORDER — ONDANSETRON HCL 4 MG/2ML IJ SOLN: 4.0000 mg | Freq: Four times a day (QID) | INTRAMUSCULAR | Status: DC | PRN

## 2013-09-15 MED ORDER — PROPOFOL 10 MG/ML IV BOLUS
INTRAVENOUS | Status: AC
  Filled 2013-09-15: qty 20

## 2013-09-15 MED ORDER — METOCLOPRAMIDE HCL 10 MG PO TABS: 5.0000 mg | ORAL_TABLET | Freq: Three times a day (TID) | ORAL | Status: DC | PRN

## 2013-09-15 MED ORDER — MIDAZOLAM HCL 5 MG/5ML IJ SOLN
INTRAMUSCULAR | Status: DC | PRN
  Administered 2013-09-15: 2 mg via INTRAVENOUS

## 2013-09-15 MED ORDER — ONDANSETRON HCL 4 MG/2ML IJ SOLN
INTRAMUSCULAR | Status: AC
  Filled 2013-09-15: qty 2

## 2013-09-15 MED ORDER — METOCLOPRAMIDE HCL 5 MG/ML IJ SOLN: 5.0000 mg | Freq: Three times a day (TID) | INTRAMUSCULAR | Status: DC | PRN

## 2013-09-15 MED ORDER — ONDANSETRON HCL 4 MG PO TABS: 4.0000 mg | ORAL_TABLET | Freq: Four times a day (QID) | ORAL | Status: DC | PRN

## 2013-09-15 MED ORDER — LACTATED RINGERS IV SOLN
INTRAVENOUS | Status: DC | PRN
  Administered 2013-09-15: 17:00:00 via INTRAVENOUS

## 2013-09-15 MED ORDER — MIDAZOLAM HCL 2 MG/2ML IJ SOLN
INTRAMUSCULAR | Status: AC
  Filled 2013-09-15: qty 2

## 2013-09-15 SURGICAL SUPPLY — 36 items
BAG ZIPLOCK 12X15 (MISCELLANEOUS) ×3 IMPLANT
DRAPE INCISE IOBAN 66X45 STRL (DRAPES) ×3 IMPLANT
DRAPE ORTHO SPLIT 77X108 STRL (DRAPES) ×4
DRAPE SURG ORHT 6 SPLT 77X108 (DRAPES) ×2 IMPLANT
DRAPE U-SHAPE 47X51 STRL (DRAPES) ×6 IMPLANT
DRSG ADAPTIC 3X8 NADH LF (GAUZE/BANDAGES/DRESSINGS) ×3 IMPLANT
DRSG MEPILEX BORDER 4X12 (GAUZE/BANDAGES/DRESSINGS) ×3 IMPLANT
DRSG MEPILEX BORDER 4X4 (GAUZE/BANDAGES/DRESSINGS) ×3 IMPLANT
DRSG MEPILEX BORDER 4X8 (GAUZE/BANDAGES/DRESSINGS) ×3 IMPLANT
DURAPREP 26ML APPLICATOR (WOUND CARE) ×3 IMPLANT
ELECT REM PT RETURN 9FT ADLT (ELECTROSURGICAL) ×3
ELECTRODE REM PT RTRN 9FT ADLT (ELECTROSURGICAL) ×1 IMPLANT
EVACUATOR 1/8 PVC DRAIN (DRAIN) ×3 IMPLANT
GAUZE SPONGE 4X4 12PLY STRL (GAUZE/BANDAGES/DRESSINGS) IMPLANT
GLOVE BIO SURGEON STRL SZ7.5 (GLOVE) ×3 IMPLANT
GLOVE BIO SURGEON STRL SZ8 (GLOVE) ×6 IMPLANT
GLOVE BIOGEL PI IND STRL 8 (GLOVE) ×3 IMPLANT
GLOVE BIOGEL PI INDICATOR 8 (GLOVE) ×6
GOWN STRL REUS W/TWL LRG LVL3 (GOWN DISPOSABLE) ×3 IMPLANT
GOWN STRL REUS W/TWL XL LVL3 (GOWN DISPOSABLE) ×3 IMPLANT
HANDPIECE INTERPULSE COAX TIP (DISPOSABLE) ×2
KIT BASIN OR (CUSTOM PROCEDURE TRAY) ×3 IMPLANT
MANIFOLD NEPTUNE II (INSTRUMENTS) ×3 IMPLANT
PACK TOTAL JOINT (CUSTOM PROCEDURE TRAY) ×3 IMPLANT
PADDING CAST COTTON 6X4 STRL (CAST SUPPLIES) IMPLANT
POSITIONER SURGICAL ARM (MISCELLANEOUS) ×3 IMPLANT
SET HNDPC FAN SPRY TIP SCT (DISPOSABLE) ×1 IMPLANT
STAPLER VISISTAT 35W (STAPLE) ×3 IMPLANT
SUT MNCRL AB 4-0 PS2 18 (SUTURE) IMPLANT
SUT VIC AB 1 CT1 27 (SUTURE) ×2
SUT VIC AB 1 CT1 27XBRD ANTBC (SUTURE) ×1 IMPLANT
SUT VIC AB 2-0 CT1 27 (SUTURE) ×6
SUT VIC AB 2-0 CT1 TAPERPNT 27 (SUTURE) ×3 IMPLANT
SWAB COLLECTION DEVICE MRSA (MISCELLANEOUS) ×3 IMPLANT
TOWEL OR 17X26 10 PK STRL BLUE (TOWEL DISPOSABLE) ×6 IMPLANT
TUBE ANAEROBIC SPECIMEN COL (MISCELLANEOUS) ×3 IMPLANT

## 2013-09-15 NOTE — Anesthesia Preprocedure Evaluation (Signed)
Anesthesia Evaluation  Patient identified by MRN, date of birth, ID band Patient awake    Reviewed: Allergy & Precautions, H&P , NPO status , Patient's Chart, lab work & pertinent test results  History of Anesthesia Complications (+) PONV  Airway Mallampati: III TM Distance: >3 FB Neck ROM: Full    Dental  (+) Teeth Intact, Dental Advisory Given   Pulmonary asthma , sleep apnea , former smoker,  breath sounds clear to auscultation        Cardiovascular hypertension, Rhythm:Regular Rate:Normal     Neuro/Psych PSYCHIATRIC DISORDERS Anxiety Depression  Neuromuscular disease    GI/Hepatic Neg liver ROS, hiatal hernia, GERD-  ,  Endo/Other  negative endocrine ROS  Renal/GU negative Renal ROS     Musculoskeletal  (+) Fibromyalgia -  Abdominal   Peds  Hematology  (+) anemia ,   Anesthesia Other Findings   Reproductive/Obstetrics negative OB ROS                           Anesthesia Physical  Anesthesia Plan  ASA: III  Anesthesia Plan: General   Post-op Pain Management:    Induction: Intravenous  Airway Management Planned: Oral ETT  Additional Equipment: None  Intra-op Plan:   Post-operative Plan: Extubation in OR  Informed Consent: I have reviewed the patients History and Physical, chart, labs and discussed the procedure including the risks, benefits and alternatives for the proposed anesthesia with the patient or authorized representative who has indicated his/her understanding and acceptance.   Dental advisory given  Plan Discussed with: CRNA and Surgeon  Anesthesia Plan Comments:         Anesthesia Quick Evaluation

## 2013-09-15 NOTE — Anesthesia Postprocedure Evaluation (Signed)
Anesthesia Post Note  Patient: [name]  Procedure(s) Performed: Procedure(s) (LRB): IRRIGATION AND DEBRIDEMENT LEFT HIP HEMATOMA (Left)  Anesthesia type: General  Patient location: PACU  Post pain: Pain level controlled  Post assessment: Post-op Vital signs reviewed  Last Vitals: BP 119/59  Pulse 86  Temp(Src) 36.8 C (Oral)  Resp 16  Ht 5\' 2"  (1.575 m)  Wt 147 lb (66.679 kg)  BMI 26.88 kg/m2  SpO2 100%  Post vital signs: Reviewed  Level of consciousness: sedated  Complications: No apparent anesthesia complications

## 2013-09-15 NOTE — Progress Notes (Signed)
PT Cancellation Note  ___Treatment cancelled today due to medical issues with patient which prohibited therapy  _X_ Treatment cancelled today due to patient receiving procedure....... I & D L hip in OR  ___ Treatment cancelled today due to patient's refusal to participate   ___ Treatment cancelled today due to   Rica Koyanagi  PTA [facility]  Acute  Rehab Pager      [phone]

## 2013-09-15 NOTE — Progress Notes (Signed)
Pt / family are interested in [facility] for [facility]. [employer] rep has been updated. Pt / family will meet with Dorian Pod from [facility] this afternoon . CSW will continue to follow to assist with d/c planning to SNF.  Werner Lean LCSW [phone]

## 2013-09-15 NOTE — Progress Notes (Signed)
OT Cancellation Note  Patient Details Name: [name] MRN: [id number] DOB: 05-01-1950   Cancelled Treatment:    Reason Eval/Treat Not Completed: Other (comment).  Pt is scheduled for I & D later today.  Will check back tomorrow.  Keashia Haskins 09/15/2013, 1:28 PM Lesle Chris, OTR/L [phone] 09/15/2013

## 2013-09-15 NOTE — Progress Notes (Signed)
Subjective: 7 Days Post-Op Procedure(s) (LRB): LEFT FEMORAL REVISION (Left) Patient reports pain as mild.   Patient seen in rounds with Dr. Wynelle Link. Still with drainage form incision. Patient is well, but has had some minor complaints of pain in the hip, requiring pain medications Plan is to go Skilled nursing facility after hospital stay.  Objective: Vital signs in last 24 hours: Temp:  [98.4 F (36.9 C)-98.9 F (37.2 C)] 98.9 F (37.2 C) (09/02 0549) Pulse Rate:  [86-88] 86 (09/02 0549) Resp:  [17-19] 17 (09/02 0549) BP: (99-107)/(46-52) 99/46 mmHg (09/02 0549) SpO2:  [99 %] 99 % (09/02 0549)  Intake/Output from previous day:  Intake/Output Summary (Last 24 hours) at 09/15/13 1254 Last data filed at 09/15/13 1100  Gross per 24 hour  Intake   1250 ml  Output   1500 ml  Net   -250 ml    Intake/Output this shift: Total I/O In: 240 [P.O.:240] Out: 400 [Urine:400]  Labs:  Recent Labs  09/13/13 0745 09/14/13 0520 09/15/13 0510  HGB 9.5* 9.2* 9.0*    Recent Labs  09/14/13 0520 09/15/13 0510  WBC 11.4* 11.1*  RBC 3.12* 3.07*  HCT 27.5* 27.7*  PLT 297 333    Recent Labs  09/14/13 0520 09/15/13 0510  NA 135* 139  K 3.7 3.9  CL 99 102  CO2 26 26  BUN 10 11  CREATININE 0.67 0.62  GLUCOSE 105* 103*  CALCIUM 8.5 8.5   No results found for this basename: LABPT, INR,  in the last 72 hours  EXAM General - Patient is Alert, Appropriate and Oriented Extremity - Neurovascular intact Sensation intact distally Dressing/Incision - clean, dry, no drainage Motor Function - intact, moving foot and toes well on exam.   Past Medical History  Diagnosis Date  . Complication of anesthesia     NAUSEA  . Chest pain     pt states due to esophageal problem -  primary care MD aware   . Hives     since pneumonia vaccine   . Asthma   . Depression   . Anxiety   . Fibromyalgia   . Spondylosis   . Bulging disc   . 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  Phantom limb pain     rt leg  . Leg pain, left   . Eczema     ears  . GERD (gastroesophageal reflux disease)     " alot of burping"  . Constipation, chronic   . Urgency incontinence   . History of transfusion   . Cancer     hx sarcoma rt knee with amputation  . Difficulty sleeping   . Sleep apnea     pt had recent sleep study done but does not know results - report called for and put on chart showing significant obstructive sleep apnea  . Hypertension   . Back pain 08/19/13    low back and  left leg pain due to nerve problems per pt  . PONV (postoperative nausea and vomiting)   . Peripheral neuropathy   . History of bronchitis   . History of pneumonia   . Hiatal hernia   . History of scarlet fever   . Postmenopausal   . History of measles   . History of mumps   . History of rubella     Assessment/Plan: 7 Days Post-Op Procedure(s) (LRB): LEFT FEMORAL REVISION (Left) Active Problems:   Fibromyalgia  GERD (gastroesophageal reflux disease)   Postoperative anemia due to acute blood loss   Depression   Anxiety state, unspecified   Phantom limb (syndrome)   Obstructive sleep apnea   Hyponatremia   Transfusion history   Dislocation of hip joint prosthesis   HTN (hypertension)   Left hip pain   Femoral loosening of prosthetic left hip   SIRS (systemic inflammatory response syndrome)  Estimated body mass index is 26.88 kg/(m^2) as calculated from the following:   Height as of this encounter: 5\' 2"  (1.575 m).   Weight as of this encounter: 66.679 kg (147 lb).  Plan for I&D of the hip later today   DVT Prophylaxis - Aspirin Weight Bearing As Tolerated left Leg  Arlee Muslim, PA-C Orthopaedic Surgery 09/15/2013, 12:54 PM

## 2013-09-15 NOTE — Progress Notes (Signed)
Brief Pharmacy Update:  Pt on D4 Vancomycin 1500 mg x1, then 750 q12 and Aztreonam 2 g IV q8h for sepsis.  Vancomycin trough tonight is subtherapeutic at 8.4.  Increase dose to vancomycin 1250mg  IV q12h and f/u renal fxn, clinical course, and duration of therapy.   Ralene Bathe, PharmD, BCPS 09/15/2013, 5:17 PM  Pager: [phone]

## 2013-09-15 NOTE — Transfer of Care (Signed)
Immediate Anesthesia Transfer of Care Note  Patient: [name]  Procedure(s) Performed: Procedure(s): IRRIGATION AND DEBRIDEMENT LEFT HIP HEMATOMA (Left)  Patient Location: PACU  Anesthesia Type:General  Level of Consciousness: awake, sedated and patient cooperative  Airway & Oxygen Therapy: Patient Spontanous Breathing and Patient connected to face mask oxygen  Post-op Assessment: Report given to PACU RN and Post -op Vital signs reviewed and stable  Post vital signs: Reviewed and stable  Complications: No apparent anesthesia complications

## 2013-09-15 NOTE — Progress Notes (Signed)
ANTIBIOTIC CONSULT NOTE - INITIAL  Pharmacy Consult for Vancomycin/Aztreonam Indication: Sepsis  Allergies  Allergen Reactions  . Bactrim [Sulfamethoxazole-Tmp Ds] Shortness Of Breath  . Cefuroxime Shortness Of Breath  . Omnipaque [Iohexol] Shortness Of Breath    Patient stated that she had a reaction a few years ago to the IV dye and had trouble breathing--so for her CT Angio Chest today (07/12/13), ER MD gave her solumedrol and Benadryl before she had test and did very well with that.  ak 07/12/13  . Pneumococcal Vaccines Hives    Hives over entire body after vaccine  . Penicillins Diarrhea  . Sulfa Antibiotics Other (See Comments)    unknown    Patient Measurements: Height: 5\' 2"  (157.5 cm) Weight: 147 lb (66.679 kg) IBW/kg (Calculated) : 50.1 Adjusted Body Weight: 57 kg  Vital Signs: Temp: 98.9 F (37.2 C) (09/02 0549) Temp src: Oral (09/02 0549) BP: 99/46 mmHg (09/02 0549) Pulse Rate: 86 (09/02 0549) Intake/Output from previous day: 09/01 0701 - 09/02 0700 In: 1000 [P.O.:600; IV Piggyback:400] Out: 600 [Urine:600] Intake/Output from this shift:    Labs:  Recent Labs  09/13/13 0745 09/14/13 0520 09/15/13 0510  WBC 12.4* 11.4* 11.1*  HGB 9.5* 9.2* 9.0*  PLT 253 297 333  CREATININE  --  0.67 0.62   Estimated Creatinine Clearance: 65.3 ml/min (by C-G formula based on Cr of 0.62). No results found for this basename: Letta Median, VANCORANDOM, GENTTROUGH, GENTPEAK, GENTRANDOM, TOBRATROUGH, TOBRAPEAK, TOBRARND, AMIKACINPEAK, AMIKACINTROU, AMIKACIN,  in the last 72 hours   Microbiology: Recent Results (from the past 720 hour(s))  URINE CULTURE     Status: None   Collection Time    08/19/13  6:37 PM      Result Value Ref Range Status   Specimen Description URINE, CATHETERIZED   Final   Special Requests Normal   Final   Culture  Setup Time     Final   Value: 08/19/2013 20:24     Performed at [place]     Final   Value: NO  GROWTH     Performed at [facility]   Culture     Final   Value: NO GROWTH     Performed at [facility]   Report Status 08/20/2013 FINAL   Final  SURGICAL PCR SCREEN     Status: Abnormal   Collection Time    08/21/13  6:10 AM      Result Value Ref Range Status   MRSA, PCR NEGATIVE  NEGATIVE Final   Staphylococcus aureus POSITIVE (*) NEGATIVE Final   Comment:            The Xpert SA Assay (FDA     approved for NASAL specimens     in patients over 42 years of age),     is one component of     a comprehensive surveillance     program.  Test performance has     been validated by [employer] for patients greater     than or equal to 65 year old.     It is not intended     to diagnose infection nor to     guide or monitor treatment.  SURGICAL PCR SCREEN     Status: Abnormal   Collection Time    09/06/13 10:12 PM      Result Value Ref Range Status   MRSA, PCR NEGATIVE  NEGATIVE Final   Staphylococcus  aureus POSITIVE (*) NEGATIVE Final   Comment:            The Xpert SA Assay (FDA     approved for NASAL specimens     in patients over 7 years of age),     is one component of     a comprehensive surveillance     program.  Test performance has     been validated by [employer] for patients greater     than or equal to 16 year old.     It is not intended     to diagnose infection nor to     guide or monitor treatment.  URINE CULTURE     Status: None   Collection Time    09/12/13  3:03 PM      Result Value Ref Range Status   Specimen Description URINE, CLEAN CATCH   Final   Special Requests NONE   Final   Culture  Setup Time     Final   Value: 09/13/2013 02:43     Performed at [place]     Final   Value: 40,000 COLONIES/ML     Performed at [facility]   Culture     Final   Value: KLEBSIELLA PNEUMONIAE     Performed at [facility]   Report Status 09/15/2013 FINAL   Final   Organism ID, Bacteria  KLEBSIELLA PNEUMONIAE   Final  CULTURE, BLOOD (ROUTINE X 2)     Status: None   Collection Time    09/12/13  3:44 PM      Result Value Ref Range Status   Specimen Description BLOOD LEFT ARM   Final   Special Requests     Final   Value: BOTTLES DRAWN AEROBIC AND ANAEROBIC 10CC BOTH BOTTLES   Culture  Setup Time     Final   Value: 09/13/2013 01:20     Performed at [facility]   Culture     Final   Value:        BLOOD CULTURE RECEIVED NO GROWTH TO DATE CULTURE WILL BE HELD FOR 5 DAYS BEFORE ISSUING A FINAL NEGATIVE REPORT     Performed at [facility]   Report Status PENDING   Incomplete  CULTURE, BLOOD (ROUTINE X 2)     Status: None   Collection Time    09/12/13  4:34 PM      Result Value Ref Range Status   Specimen Description BLOOD LEFT ARM   Final   Special Requests     Final   Value: BOTTLES DRAWN AEROBIC AND ANAEROBIC 10CC BOTH BOTTLES   Culture  Setup Time     Final   Value: 09/13/2013 01:20     Performed at [facility]   Culture     Final   Value:        BLOOD CULTURE RECEIVED NO GROWTH TO DATE CULTURE WILL BE HELD FOR 5 DAYS BEFORE ISSUING A FINAL NEGATIVE REPORT     Performed at [facility]   Report Status PENDING   Incomplete    Medical History: Past Medical History  Diagnosis Date  . Complication of anesthesia     NAUSEA  . Chest pain     pt states due to esophageal problem -  primary care MD aware   . Hives     since pneumonia vaccine   . Asthma   .  Depression   . Anxiety   . Fibromyalgia   . Spondylosis   . Bulging disc   . 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 Phantom limb pain     rt leg  . Leg pain, left   . Eczema     ears  . GERD (gastroesophageal reflux disease)     " alot of burping"  . Constipation, chronic   . Urgency incontinence   . History of transfusion   . Cancer     hx sarcoma rt knee with amputation  . Difficulty sleeping   . Sleep apnea     pt had recent sleep study done but does not  know results - report called for and put on chart showing significant obstructive sleep apnea  . Hypertension   . Back pain 08/19/13    low back and  left leg pain due to nerve problems per pt  . PONV (postoperative nausea and vomiting)   . Peripheral neuropathy   . History of bronchitis   . History of pneumonia   . Hiatal hernia   . History of scarlet fever   . Postmenopausal   . History of measles   . History of mumps   . History of rubella     Medications:  Anti-infectives   Start     Dose/Rate Route Frequency Ordered Stop   09/13/13 0500  vancomycin (VANCOCIN) IVPB 750 mg/150 ml premix     750 mg 150 mL/hr over 60 Minutes Intravenous Every 12 hours 09/12/13 1649     09/12/13 1700  vancomycin (VANCOCIN) 1,500 mg in sodium chloride 0.9 % 500 mL IVPB     1,500 mg 250 mL/hr over 120 Minutes Intravenous  Once 09/12/13 1649 09/12/13 2000   09/12/13 1645  vancomycin (VANCOCIN) IVPB 750 mg/150 ml premix  Status:  Discontinued     750 mg 150 mL/hr over 60 Minutes Intravenous Every 12 hours 09/12/13 1636 09/12/13 1647   09/12/13 1645  aztreonam (AZACTAM) 2 g in dextrose 5 % 50 mL IVPB     2 g 100 mL/hr over 30 Minutes Intravenous 3 times per day 09/12/13 1636     09/09/13 0600  vancomycin (VANCOCIN) IVPB 1000 mg/200 mL premix     1,000 mg 200 mL/hr over 60 Minutes Intravenous Every 12 hours 09/08/13 2138 09/09/13 0725   09/08/13 0800  vancomycin (VANCOCIN) IVPB 1000 mg/200 mL premix     1,000 mg 200 mL/hr over 60 Minutes Intravenous  Once 09/07/13 0816 09/08/13 1805   09/08/13 0709  vancomycin (VANCOCIN) IVPB 1000 mg/200 mL premix  Status:  Discontinued     1,000 mg 200 mL/hr over 60 Minutes Intravenous On call to O.R. 09/08/13 0709 09/08/13 2137     Assessment: 62yoF underwent L femoral revision on 4/00 complicated by SIRS and possible wound infection. Fever resolving on vancomycin and aztreonam. No pneumonia or UTI to explain symptoms. If wound appearing infected, Dr. Wynelle Link may  take back to OR today for washout.   Vancomycin 8/30 >>  Aztreonam 8/30 >>   WBC elevated but nearly WNL Afebrile since 9/1 Renal function remains intact; CrCl 65 ml/min  BCx 8/30: NGTD x2 UCx 8/30: 40,000 K. Pneumoniae  Goal of Therapy:  Vancomycin trough level 15-20 mcg/ml Eradication of infection; appropriate renal dosing of antibiotics  Plan:   Continue Vancomycin 750 mg IV q12 and Aztreonam 2 g IV q8h.  Will discuss need for continued  vancomycin with MD based on suspected source of infection (wound vs other) and lack of culture growth.  Vancomycin trough at 1600 today  Measure antibiotic drug levels at steady state  Follow up culture results  Reuel Boom, PharmD [phone] 09/15/2013 7:46 AM

## 2013-09-15 NOTE — Progress Notes (Signed)
PROGRESS NOTE  [name] KZS:[id number] DOB: Jun 21, 1950 DOA: 09/06/2013 PCP: Shary Key, MD  Assessment/Plan: SIRS,  -secondary to possible wound infection  -09/15/2013 L-hip I&D-->hematoma-->await cutlures - Repeat CXR and UA negative  - Blood cultures NGTD  - Continue vancomycin and aztreonam  - Trend creatinine on ABX  - Will continue to reevaluate incision/wound on daily basis  Left leg hip prosthesis dislocation - s/p left hip prosthesis revision on 8/26 by Dr. Wynelle Link  - DVT proph with xarelto--restart when okay with ortho  - Weight bearing status per orthopedics  Leukocytosis,  -trending down  - continue abx as above  - repeat WBC in AM  Acute blood loss anemia  -from recent surgery superimposed on iron deficiency and chronic disease. hgb improved post transfusion.  - Continue iron supplements.  - Transfused 2 units PRBC intraoperatively on 8/26.  - Transfused 1 unit PRBC 8/28  Acute hypoxic respiratory failure  -likely due to atelectasis, spirometry use post-operative,  -resolved.  HTN (hypertension),  -blood pressure low normal. Continue to hold irbesartan.  Fibromyalgia, continue prn dilaudid and celexa  GERD (gastroesophageal reflux disease), stable, continue nexium  Depression/Anxiety, stable, continue wellbutrin, celexa, and buspar, and continue increased frequency of prn ativan. Needs close follow up with primary psychiatrist  Pruritis with history of hives being managed by dermatologist, improved. Continue loratadine, prn benadryl, and doxepin  Hyponatremia,  -may be due to dehydration and fluid shifts post large surgery, mild and asymptomatic.  -improved with IVF - Repeat in AM  Thrombocytosis,  -reactive from femur fracture and resolved.   Family Communication:   None at beside Disposition Plan:   Likely snf  Consultants:  Ortho. Dr. Wynelle Link Procedures:  Left femoral revision on 09/08/13 L-hip I&D  09/15/13 Antibiotics:    Perioperative Vancomycin  Vancomycin 8/30 >>  Aztreonam 8/30 >>      Procedures/Studies: Dg Hip Complete Left  09/06/2013   CLINICAL DATA:  Left leg pain  EXAM: LEFT HIP - COMPLETE 2+ VIEW  COMPARISON:  None.  FINDINGS: There is a left total hip arthroplasty. There is superior dislocation of the left femoral head component relative to the acetabular component. There is no acute fracture.  There is generalized osteopenia.  IMPRESSION: Left total hip arthroplasty with superior dislocation of the left femoral head component relative to the acetabulum.   Electronically Signed   By: Kathreen Devoid   On: 09/06/2013 18:25   Dg Femur Left  09/06/2013   CLINICAL DATA:  Increasing LEFT leg pain on Thursday, post hip replacement in July, hip injury  EXAM: LEFT FEMUR - 2 VIEW  COMPARISON:  Preoperative exam 07/12/2013  FINDINGS: Osseous demineralization.  LEFT hip prosthesis identified with superior dislocation of the femoral component.  Cerclage wires and lateral plate at the proximal LEFT femur appear intact.  No periprosthetic lucency or fracture identified.  Visualized pelvis intact.  Knee joint spaces preserved.  IMPRESSION: Dislocated LEFT hip prosthesis.  Osseous demineralization.   Electronically Signed   By: Lavonia Dana M.D.   On: 09/06/2013 18:24   Dg Pelvis Portable  09/08/2013   CLINICAL DATA:  Status post left hip surgery.  EXAM: PORTABLE PELVIS 1-2 VIEWS  COMPARISON:  08/21/2013.  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 A new left total hip prosthesis is demonstrated with interval discontinuity of the upper cerclage wire. No acute fracture or dislocation  seen.  IMPRESSION: Interval replacement of the left hip prosthesis with discontinuity of the upper cerclage wire.   Electronically Signed   By: Enrique Sack M.D.   On: 09/08/2013 20:31   Dg Pelvis Portable  08/21/2013   CLINICAL DATA:  Postop ORIF left hip  fracture  EXAM: PORTABLE PELVIS 1-2 VIEWS  COMPARISON:  07/28/2013  FINDINGS: Cerclage wire fixation of the left greater trochanter.  Left total hip arthroplasty.  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  Visualized bony pelvis appears intact.  IMPRESSION: Cerclage wire fixation of the left greater trochanter.  Left total hip arthroplasty.   Electronically Signed   By: Julian Hy M.D.   On: 08/21/2013 14:32   Dg Chest Port 1 View  09/12/2013   CLINICAL DATA:  63 year old female with fever, cough and congestion.  EXAM: PORTABLE CHEST - 1 VIEW  COMPARISON:  09/10/2013 and prior radiographs dating back to 07/12/2013.  FINDINGS: The cardiomediastinal silhouette is unremarkable.  There is no evidence of focal airspace disease, pulmonary edema, suspicious pulmonary nodule/mass, pleural effusion, or pneumothorax. No acute bony abnormalities are identified.  IMPRESSION: No active disease.   Electronically Signed   By: Hassan Rowan M.D.   On: 09/12/2013 13:46   Dg Chest Port 1 View  09/10/2013   CLINICAL DATA:  Elevated white blood count, cough, shortness of breath  EXAM: PORTABLE CHEST - 1 VIEW  COMPARISON:  07/12/2013  FINDINGS: The heart size and mediastinal contours are within normal limits. Both lungs are clear. The visualized skeletal structures are unremarkable.  IMPRESSION: No active disease.   Electronically Signed   By: Skipper Cliche M.D.   On: 09/10/2013 07:41          Objective: Filed Vitals:   09/15/13 1415 09/15/13 1831 09/15/13 1833 09/15/13 1845  BP: 115/96 123/57  122/64  Pulse: 100  84 81  Temp: 98.9 F (37.2 C)  97.4 F (36.3 C)   TempSrc: Oral     Resp: 16  16 15   Height:      Weight:      SpO2: 97%  100% 100%    Intake/Output Summary (Last 24 hours) at 09/15/13 1912 Last data filed at 09/15/13 1829  Gross per 24 hour  Intake   1290 ml  Output   1500 ml  Net   -210 ml   Weight change:  Exam:   Data Reviewed: Basic Metabolic  Panel:  Recent Labs Lab 09/09/13 0529 09/10/13 0532 09/14/13 0520 09/15/13 0510  NA 133* 139 135* 139  K 4.9 3.9 3.7 3.9  CL 101 104 99 102  CO2 22 27 26 26   GLUCOSE 193* 142* 105* 103*  BUN 14 13 10 11   CREATININE 0.75 0.68 0.67 0.62  CALCIUM 8.0* 8.5 8.5 8.5   Liver Function Tests: No results found for this basename: AST, ALT, ALKPHOS, BILITOT, PROT, ALBUMIN,  in the last 168 hours No results found for this basename: LIPASE, AMYLASE,  in the last 168 hours No results found for this basename: AMMONIA,  in the last 168 hours CBC:  Recent Labs Lab 09/10/13 0532 09/11/13 0518 09/13/13 0745 09/14/13 0520 09/15/13 0510  WBC 18.7* 14.6* 12.4* 11.4* 11.1*  NEUTROABS  --   --  8.0*  --   --   HGB 7.7* 10.1* 9.5* 9.2* 9.0*  HCT 23.0* 29.9* 29.1* 27.5* 27.7*  MCV 88.5 86.9 90.4 88.1 90.2  PLT 280 255 253 297 333  Cardiac Enzymes: No results found for this basename: CKTOTAL, CKMB, CKMBINDEX, TROPONINI,  in the last 168 hours BNP: No components found with this basename: POCBNP,  CBG: No results found for this basename: GLUCAP,  in the last 168 hours  Recent Results (from the past 240 hour(s))  SURGICAL PCR SCREEN     Status: Abnormal   Collection Time    09/06/13 10:12 PM      Result Value Ref Range Status   MRSA, PCR NEGATIVE  NEGATIVE Final   Staphylococcus aureus POSITIVE (*) NEGATIVE Final   Comment:            The Xpert SA Assay (FDA     approved for NASAL specimens     in patients over 34 years of age),     is one component of     a comprehensive surveillance     program.  Test performance has     been validated by [employer] for patients greater     than or equal to 13 year old.     It is not intended     to diagnose infection nor to     guide or monitor treatment.  URINE CULTURE     Status: None   Collection Time    09/12/13  3:03 PM      Result Value Ref Range Status   Specimen Description URINE, CLEAN CATCH   Final   Special Requests NONE    Final   Culture  Setup Time     Final   Value: 09/13/2013 02:43     Performed at [facility]     Final   Value: 40,000 COLONIES/ML     Performed at [facility]   Culture     Final   Value: KLEBSIELLA PNEUMONIAE     Performed at [facility]   Report Status 09/15/2013 FINAL   Final   Organism ID, Bacteria KLEBSIELLA PNEUMONIAE   Final  CULTURE, BLOOD (ROUTINE X 2)     Status: None   Collection Time    09/12/13  3:44 PM      Result Value Ref Range Status   Specimen Description BLOOD LEFT ARM   Final   Special Requests     Final   Value: BOTTLES DRAWN AEROBIC AND ANAEROBIC 10CC BOTH BOTTLES   Culture  Setup Time     Final   Value: 09/13/2013 01:20     Performed at [facility]   Culture     Final   Value:        BLOOD CULTURE RECEIVED NO GROWTH TO DATE CULTURE WILL BE HELD FOR 5 DAYS BEFORE ISSUING A FINAL NEGATIVE REPORT     Performed at [facility]   Report Status PENDING   Incomplete  CULTURE, BLOOD (ROUTINE X 2)     Status: None   Collection Time    09/12/13  4:34 PM      Result Value Ref Range Status   Specimen Description BLOOD LEFT ARM   Final   Special Requests     Final   Value: BOTTLES DRAWN AEROBIC AND ANAEROBIC 10CC BOTH BOTTLES   Culture  Setup Time     Final   Value: 09/13/2013 01:20     Performed at [facility]   Culture     Final   Value:        BLOOD CULTURE RECEIVED NO  GROWTH TO DATE CULTURE WILL BE HELD FOR 5 DAYS BEFORE ISSUING A FINAL NEGATIVE REPORT     Performed at [facility]   Report Status PENDING   Incomplete     Scheduled Meds: . aspirin EC  325 mg Oral Daily  . aztreonam  2 g Intravenous 3 times per day  . buPROPion  150 mg Oral BID  . busPIRone  10 mg Oral BID  . citalopram  10 mg Oral q morning - 10a  . cyclobenzaprine  10 mg Oral BID  . docusate sodium  100 mg Oral BID  . doxepin  10 mg Oral QHS  . esomeprazole  20 mg Oral BID  . iron polysaccharides  150 mg  Oral BID  . loratadine  10 mg Oral Daily  . mupirocin ointment   Nasal BID  . oxybutynin  5 mg Oral Daily  . polyethylene glycol  17 g Oral Daily  . saccharomyces boulardii  250 mg Oral BID  . [START ON 09/16/2013] vancomycin  1,250 mg Intravenous Q12H   Continuous Infusions: . sodium chloride    . lactated ringers       Blanka Rockholt, DO  [facility] Pager [phone]  If 7PM-7AM, please contact night-coverage [email or web address] Password [email or web address] 09/15/2013, 7:12 PM   LOS: 9 days

## 2013-09-15 NOTE — Brief Op Note (Signed)
09/06/2013 - 09/15/2013  6:20 PM  PATIENT:  [name]  63 y.o. female  PRE-OPERATIVE DIAGNOSIS:  hematoma left hip   POST-OPERATIVE DIAGNOSIS:  HEMATOMA LEFT HIP  PROCEDURE:  Procedure(s): IRRIGATION AND DEBRIDEMENT LEFT HIP HEMATOMA (Left)  SURGEON:  Surgeon(s) and Role:    * [name], MD - Primary  PHYSICIAN ASSISTANT:   ASSISTANTS: Arlee Muslim, PA-C   ANESTHESIA:   general  EBL:  Total I/O In: 52 [P.O.:240; IV Piggyback:50] Out: 400 [Urine:400]  BLOOD ADMINISTERED:none  DRAINS: (Medium) Hemovact drain(s) in the left hip with  Suction Open   LOCAL MEDICATIONS USED:  NONE  COUNTS:  YES  TOURNIQUET:  * No tourniquets in log *  DICTATION: .Other Dictation: Dictation Number [id number]  PLAN OF CARE: Admit to inpatient   PATIENT DISPOSITION:  PACU - hemodynamically stable.

## 2013-09-16 ENCOUNTER — Encounter (HOSPITAL_COMMUNITY): Payer: Self-pay | Admitting: Orthopedic Surgery

## 2013-09-16 LAB — CBC
HCT: 23.7 % — ABNORMAL LOW (ref 36.0–46.0)
Hemoglobin: 7.7 g/dL — ABNORMAL LOW (ref 12.0–15.0)
MCH: 29.4 pg (ref 26.0–34.0)
MCHC: 32.5 g/dL (ref 30.0–36.0)
MCV: 90.5 fL (ref 78.0–100.0)
Platelets: 374 10*3/uL (ref 150–400)
RBC: 2.62 MIL/uL — ABNORMAL LOW (ref 3.87–5.11)
RDW: 14.1 % (ref 11.5–15.5)
WBC: 12.2 10*3/uL — ABNORMAL HIGH (ref 4.0–10.5)

## 2013-09-16 LAB — BASIC METABOLIC PANEL
Anion gap: 10 (ref 5–15)
BUN: 10 mg/dL (ref 6–23)
CALCIUM: 8.4 mg/dL (ref 8.4–10.5)
CO2: 26 mEq/L (ref 19–32)
CREATININE: 0.55 mg/dL (ref 0.50–1.10)
Chloride: 104 mEq/L (ref 96–112)
GLUCOSE: 151 mg/dL — AB (ref 70–99)
POTASSIUM: 4.3 meq/L (ref 3.7–5.3)
Sodium: 140 mEq/L (ref 137–147)

## 2013-09-16 MED ORDER — MAGIC MOUTHWASH
5.0000 mL | Freq: Four times a day (QID) | ORAL | Status: DC
  Administered 2013-09-16 – 2013-09-21 (×19): 5 mL via ORAL
  Filled 2013-09-16 (×24): qty 5

## 2013-09-16 NOTE — Progress Notes (Signed)
[facility] has notified CSW that they have received authorization from [facility] for SNF placement . SNF bed will be available for pt when she is stable for d/c.  Werner Lean LCSW [phone]

## 2013-09-16 NOTE — Progress Notes (Signed)
Occupational Therapy Treatment Patient Details Name: [name] MRN: [id number] DOB: January 03, 1951 Today's Date: 09/16/2013    History of present illness LEFT FEMORAL REVISION -Left.. Pt with history of RAKA.    Pt did have I and D of L hip hematoma 9/2.     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  Increased assist back to bed.  Transfers                 General transfer comment: did not perform    Balance     Sitting balance-Leahy Scale: Good Sitting balance - Comments: balance better this day- able to use BUE for activity in sitting                           ADL Overall ADL's : Needs assistance/impaired     Grooming: Wash/dry face;Sitting;Brushing hair Grooming Details (indicate cue type and reason): pt sat on EOB using BUE for approx 18 minutes.  Pt feeling much better . Pt needed max encourageement and thanked OT for the nudge at end of session                                                Cognition   Behavior During Therapy: [facility] for tasks assessed/performed Overall Cognitive Status: Within Functional Limits for tasks assessed                               General Comments      Pertinent Vitals/ Pain       Pain Score: 5  Pain Location: l hip Pain Descriptors / Indicators: Sore Pain Intervention(s): Monitored during  session;Repositioned;Ice applied         Frequency Min 2X/week     Progress Toward Goals  OT Goals(current goals can now be found in the care plan section)  Progress towards OT goals: Progressing toward goals  Acute Rehab OT Goals Patient Stated Goal: get to the rehab so i can get home OT Goal Formulation: With patient  Plan Discharge plan remains appropriate       End of Session     Activity Tolerance Patient tolerated treatment well   Patient Left in bed;with call bell/phone within reach   Nurse Communication          Time: 4098-1191 OT Time Calculation (min): 24 min  Charges: OT General Charges $OT Visit: 1 Procedure OT Treatments $Self Care/Home Management : 23-37 mins  Talyssa Gibas, Thereasa Parkin 09/16/2013, 11:16 AM

## 2013-09-16 NOTE — Op Note (Signed)
NAMEMarland Kitchen  [name], [name] NO.:  [id number]  MEDICAL RECORD NO.:  [id number]  LOCATION:  99                         FACILITY:  [facility]  PHYSICIAN:  Gaynelle Arabian, M.D.    DATE OF BIRTH:  [birth year]  DATE OF PROCEDURE:  09/15/2013 DATE OF DISCHARGE:                              OPERATIVE REPORT   PREOPERATIVE DIAGNOSIS:  Left hip hematoma.  POSTOPERATIVE DIAGNOSIS:  Left hip hematoma.  PROCEDURE:  Irrigation and debridement of the left hip.  SURGEON:  Gaynelle Arabian, MD  ASSISTANT:  Alexzandrew L. Perkins, PA-C.  ANESTHESIA:  General.  ESTIMATED BLOOD LOSS:  Minimal.  DRAINS:  Hemovac x1.  COMPLICATIONS:  None.  CONDITION:  Stable to recovery.  BRIEF CLINICAL NOTE:  [name] is a 63 year old female with complex history in regards to her left hip.  She had a hip revision a week ago. She started to have some drainage from her incision 2 days ago and it was felt she had a hematoma due to Xarelto.  Xarelto was stopped.  She has had some wound drainage today with some slight erythema on the wound edge.  There is a site to perform a formal I and D of the hip to evacuate the hematoma and prevent infection from developing.  She presents now for that procedure.  PROCEDURE IN DETAIL:  After successful administration of general anesthetic, the patient was placed in a right lateral decubitus position with the left side up and held with the hip positioner.  Left lower extremity was isolated from her perineum with plastic drapes and prepped and draped in the usual sterile fashion.  Previous posterolateral incisions were utilized.  Skin cut with a 10 blade through subcutaneous tissue to the fascia lata.  There was a small rent in the fascia lata and a large amount of hematoma present at the joint level.  We incised the fascia lata and then thoroughly irrigated the joint and all the wound bed with 6 L of saline using pulsatile lavage.  The tissue all looked healthy.   This all looked like hematoma and no evidence of any purulence.  The hematoma was sent for Gram stain, culture and sensitivity.  After irrigation with 6 L, I then placed the Hemovac drain and closed the fascia lata over the drain with a running #1 V-Loc suture.  Subcu was closed in 2 layers with #1 V locked and also superficially with 2-0 Vicryl.  Skin was closed with staples.  The drains hooked to suction.  Incision cleaned and dried and a bulky sterile dressing applied.  She was then awakened and transported to recovery in stable condition.     Gaynelle Arabian, M.D.     FA/MEDQ  D:  09/15/2013  T:  09/16/2013  Job:  [id number]

## 2013-09-16 NOTE — Progress Notes (Signed)
Subjective: 1 Day Post-Op Procedure(s) (LRB): IRRIGATION AND DEBRIDEMENT LEFT HIP HEMATOMA (Left) Patient reports pain as mild.   Patient seen in rounds by Dr. Wynelle Link.  Hemovac came out last night. Patient is well, but has had some minor complaints of pain in the hip, requiring pain medications We will resume therapy today. HGB is 7.7 today.  Will monitor fur symptoms and recheck labs in the AM. Plan is to go Skilled nursing facility after hospital stay.  Objective: Vital signs in last 24 hours: Temp:  [97.4 F (36.3 C)-98.9 F (37.2 C)] 98.3 F (36.8 C) (09/03 0606) Pulse Rate:  [78-100] 78 (09/03 0606) Resp:  [9-19] 17 (09/03 0606) BP: (107-131)/(50-96) 107/53 mmHg (09/03 0606) SpO2:  [97 %-100 %] 100 % (09/03 0606)  Intake/Output from previous day:  Intake/Output Summary (Last 24 hours) at 09/16/13 0852 Last data filed at 09/16/13 0800  Gross per 24 hour  Intake 1896.25 ml  Output   1800 ml  Net  96.25 ml    Intake/Output this shift: Total I/O In: 360 [P.O.:360] Out: -   Labs:  Recent Labs  09/14/13 0520 09/15/13 0510 09/16/13 0533  HGB 9.2* 9.0* 7.7*    Recent Labs  09/15/13 0510 09/16/13 0533  WBC 11.1* 12.2*  RBC 3.07* 2.62*  HCT 27.7* 23.7*  PLT 333 374    Recent Labs  09/15/13 0510 09/16/13 0533  NA 139 140  K 3.9 4.3  CL 102 104  CO2 26 26  BUN 11 10  CREATININE 0.62 0.55  GLUCOSE 103* 151*  CALCIUM 8.5 8.4   No results found for this basename: LABPT, INR,  in the last 72 hours  EXAM General - Patient is Alert, Appropriate and Oriented Extremity - Neurovascular intact Sensation intact distally Dressing - dressing C/D/I Motor Function - intact, moving foot and toes well on exam.   Past Medical History  Diagnosis Date  . Complication of anesthesia     NAUSEA  . Chest pain     pt states due to esophageal problem -  primary care MD aware   . Hives     since pneumonia vaccine   . Asthma   . Depression   . Anxiety   .  Fibromyalgia   . Spondylosis   . Bulging disc   . 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 Phantom limb pain     rt leg  . Leg pain, left   . Eczema     ears  . GERD (gastroesophageal reflux disease)     " alot of burping"  . Constipation, chronic   . Urgency incontinence   . History of transfusion   . Cancer     hx sarcoma rt knee with amputation  . Difficulty sleeping   . Sleep apnea     pt had recent sleep study done but does not know results - report called for and put on chart showing significant obstructive sleep apnea  . Hypertension   . Back pain 08/19/13    low back and  left leg pain due to nerve problems per pt  . PONV (postoperative nausea and vomiting)   . Peripheral neuropathy   . History of bronchitis   . History of pneumonia   . Hiatal hernia   . History of scarlet fever   . Postmenopausal   . History of measles   . History of mumps   . History of rubella  Assessment/Plan: 1 Day Post-Op Procedure(s) (LRB): IRRIGATION AND DEBRIDEMENT LEFT HIP HEMATOMA (Left) Active Problems:   Fibromyalgia   GERD (gastroesophageal reflux disease)   Postoperative anemia due to acute blood loss   Depression   Anxiety state, unspecified   Phantom limb (syndrome)   Obstructive sleep apnea   Hyponatremia   Transfusion history   Dislocation of hip joint prosthesis   HTN (hypertension)   Left hip pain   Femoral loosening of prosthetic left hip   SIRS (systemic inflammatory response syndrome)  Estimated body mass index is 26.88 kg/(m^2) as calculated from the following:   Height as of this encounter: 5\' 2"  (1.575 m).   Weight as of this encounter: 66.679 kg (147 lb). Up with therapy Discharge to SNF  DVT Prophylaxis - Aspirin Weight Bearing As Tolerated left Leg Hemovac came out Resume Therapy Hip Preacutions  Arlee Muslim, PA-C Orthopaedic Surgery 09/16/2013, 8:52 AM

## 2013-09-16 NOTE — Progress Notes (Signed)
PROGRESS NOTE  [name] IEP:[id number] DOB: [birth year] DOA: 09/06/2013 PCP: Shary Key, MD  Assessment/Plan: SIRS -secondary to possible wound infection  -09/15/2013 L-hip I&D-->hematoma-->await cutlures  - Repeat CXR and UA negative  - 8/30 Blood cultures NGTD  - Continue vancomycin and aztreonam  - hemovac "fell out" evening of 09/15/13 -plan to d/c abx if stable and cultures remain neg and observe Left leg hip prosthesis dislocation  - s/p left hip prosthesis revision on 8/26 by Dr. Wynelle Link  - DVT proph with xarelto--restart when okay with ortho  - Weight bearing status per orthopedics  Leukocytosis,  -trending down, slightly up from surgery 9/2--likely stress demargination - continue abx as above  - repeat WBC in AM  -remains afebrile and hemodynamically stable Acute blood loss anemia  -from recent surgery superimposed on iron deficiency and chronic disease. - hgb improved post transfusion.  - Continue iron supplements.  - Transfused 2 units PRBC intraoperatively on 8/26.  - Transfused 1 unit PRBC 8/28  Acute hypoxic respiratory failure  -likely due to atelectasis, spirometry use post-operative,  -resolved.  HTN (hypertension),  -blood pressure low normal. Continue to hold irbesartan.  Fibromyalgia, continue prn dilaudid and celexa  GERD (gastroesophageal reflux disease), stable, continue nexium  Depression/Anxiety, stable, continue wellbutrin, celexa, and buspar, and continue increased frequency of prn ativan. Needs close follow up with primary psychiatrist  Pruritis with history of hives being managed by dermatologist, improved. Continue loratadine, prn benadryl, and doxepin  Hyponatremia,  -may be due to dehydration and fluid shifts post large surgery, mild and asymptomatic.  -improved with IVF  Thrombocytosis,  -reactive from femur fracture  -resolved Family Communication:   Pt at beside Disposition Plan:   [employer] when  stable  Consultants:  Ortho. Dr. Wynelle Link Procedures:  Left femoral revision on 09/08/13 Left femoral I&D/washout--09/15/13 Antibiotics:  Perioperative Vancomycin  Vancomycin 8/30 >>  Aztreonam 8/30 >>     Procedures/Studies: Dg Hip Complete Left  09/06/2013   CLINICAL DATA:  Left leg pain  EXAM: LEFT HIP - COMPLETE 2+ VIEW  COMPARISON:  None.  FINDINGS: There is a left total hip arthroplasty. There is superior dislocation of the left femoral head component relative to the acetabular component. There is no acute fracture.  There is generalized osteopenia.  IMPRESSION: Left total hip arthroplasty with superior dislocation of the left femoral head component relative to the acetabulum.   Electronically Signed   By: Kathreen Devoid   On: 09/06/2013 18:25   Dg Femur Left  09/06/2013   CLINICAL DATA:  Increasing LEFT leg pain on Thursday, post hip replacement in July, hip injury  EXAM: LEFT FEMUR - 2 VIEW  COMPARISON:  Preoperative exam 07/12/2013  FINDINGS: Osseous demineralization.  LEFT hip prosthesis identified with superior dislocation of the femoral component.  Cerclage wires and lateral plate at the proximal LEFT femur appear intact.  No periprosthetic lucency or fracture identified.  Visualized pelvis intact.  Knee joint spaces preserved.  IMPRESSION: Dislocated LEFT hip prosthesis.  Osseous demineralization.   Electronically Signed   By: Lavonia Dana M.D.   On: 09/06/2013 18:24   Dg Pelvis Portable  09/08/2013   CLINICAL DATA:  Status post left hip surgery.  EXAM: PORTABLE PELVIS 1-2 VIEWS  COMPARISON:  08/21/2013.  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 A new left total hip prosthesis is demonstrated with interval discontinuity  of the upper cerclage wire. No acute fracture or dislocation seen.  IMPRESSION: Interval replacement of the left hip prosthesis with discontinuity of the upper cerclage wire.    Electronically Signed   By: Enrique Sack M.D.   On: 09/08/2013 20:31   Dg Pelvis Portable  08/21/2013   CLINICAL DATA:  Postop ORIF left hip fracture  EXAM: PORTABLE PELVIS 1-2 VIEWS  COMPARISON:  07/28/2013  FINDINGS: Cerclage wire fixation of the left greater trochanter.  Left total hip arthroplasty.  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  Visualized bony pelvis appears intact.  IMPRESSION: Cerclage wire fixation of the left greater trochanter.  Left total hip arthroplasty.   Electronically Signed   By: Julian Hy M.D.   On: 08/21/2013 14:32   Dg Chest Port 1 View  09/12/2013   CLINICAL DATA:  63 year old female with fever, cough and congestion.  EXAM: PORTABLE CHEST - 1 VIEW  COMPARISON:  09/10/2013 and prior radiographs dating back to 07/12/2013.  FINDINGS: The cardiomediastinal silhouette is unremarkable.  There is no evidence of focal airspace disease, pulmonary edema, suspicious pulmonary nodule/mass, pleural effusion, or pneumothorax. No acute bony abnormalities are identified.  IMPRESSION: No active disease.   Electronically Signed   By: Hassan Rowan M.D.   On: 09/12/2013 13:46   Dg Chest Port 1 View  09/10/2013   CLINICAL DATA:  Elevated white blood count, cough, shortness of breath  EXAM: PORTABLE CHEST - 1 VIEW  COMPARISON:  07/12/2013  FINDINGS: The heart size and mediastinal contours are within normal limits. Both lungs are clear. The visualized skeletal structures are unremarkable.  IMPRESSION: No active disease.   Electronically Signed   By: Skipper Cliche M.D.   On: 09/10/2013 07:41         Subjective: Pt states the hemovac drain fell out last night.  Otherwise, she denies any f/c, cp, sob, n/v/d, abdominal pain or dysuria.  No cough or HA  Objective: Filed Vitals:   09/15/13 2205 09/15/13 2300 09/16/13 0153 09/16/13 0606  BP: 129/58 112/50 112/54 107/53  Pulse: 88 88 85 78  Temp: 98.2 F (36.8 C) 98.3 F (36.8 C) 98 F (36.7 C) 98.3 F (36.8  C)  TempSrc:   Oral Oral  Resp: 18 16 16 17   Height:      Weight:      SpO2: 98% 98% 99% 100%    Intake/Output Summary (Last 24 hours) at 09/16/13 1950 Last data filed at 09/16/13 0156  Gross per 24 hour  Intake 1776.25 ml  Output   1800 ml  Net -23.75 ml   Weight change:  Exam:   General:  Pt is alert, follows commands appropriately, not in acute distress  HEENT: No icterus, No thrush, No neck mass, [place]/AT  Cardiovascular: RRR, S1/S2, no rubs, no gallops  Respiratory: CTA bilaterally, no wheezing, no crackles, no rhonchi  Abdomen: Soft/+BS, non tender, non distended, no guarding  Extremities: L-hip with edema around incision without pus, crepitance or lymphangitis  Data Reviewed: Basic Metabolic Panel:  Recent Labs Lab 09/10/13 0532 09/14/13 0520 09/15/13 0510 09/16/13 0533  NA 139 135* 139 140  K 3.9 3.7 3.9 4.3  CL 104 99 102 104  CO2 27 26 26 26   GLUCOSE 142* 105* 103* 151*  BUN 13 10 11 10   CREATININE 0.68 0.67 0.62 0.55  CALCIUM 8.5 8.5 8.5 8.4   Liver Function Tests: No results found for this basename: AST, ALT, ALKPHOS, BILITOT,  PROT, ALBUMIN,  in the last 168 hours No results found for this basename: LIPASE, AMYLASE,  in the last 168 hours No results found for this basename: AMMONIA,  in the last 168 hours CBC:  Recent Labs Lab 09/11/13 0518 09/13/13 0745 09/14/13 0520 09/15/13 0510 09/16/13 0533  WBC 14.6* 12.4* 11.4* 11.1* 12.2*  NEUTROABS  --  8.0*  --   --   --   HGB 10.1* 9.5* 9.2* 9.0* 7.7*  HCT 29.9* 29.1* 27.5* 27.7* 23.7*  MCV 86.9 90.4 88.1 90.2 90.5  PLT 255 253 297 333 374   Cardiac Enzymes: No results found for this basename: CKTOTAL, CKMB, CKMBINDEX, TROPONINI,  in the last 168 hours BNP: No components found with this basename: POCBNP,  CBG: No results found for this basename: GLUCAP,  in the last 168 hours  Recent Results (from the past 240 hour(s))  SURGICAL PCR SCREEN     Status: Abnormal   Collection Time     09/06/13 10:12 PM      Result Value Ref Range Status   MRSA, PCR NEGATIVE  NEGATIVE Final   Staphylococcus aureus POSITIVE (*) NEGATIVE Final   Comment:            The Xpert SA Assay (FDA     approved for NASAL specimens     in patients over 40 years of age),     is one component of     a comprehensive surveillance     program.  Test performance has     been validated by [employer] for patients greater     than or equal to 22 year old.     It is not intended     to diagnose infection nor to     guide or monitor treatment.  URINE CULTURE     Status: None   Collection Time    09/12/13  3:03 PM      Result Value Ref Range Status   Specimen Description URINE, CLEAN CATCH   Final   Special Requests NONE   Final   Culture  Setup Time     Final   Value: 09/13/2013 02:43     Performed at [place]     Final   Value: 40,000 COLONIES/ML     Performed at [facility]   Culture     Final   Value: KLEBSIELLA PNEUMONIAE     Performed at [facility]   Report Status 09/15/2013 FINAL   Final   Organism ID, Bacteria KLEBSIELLA PNEUMONIAE   Final  CULTURE, BLOOD (ROUTINE X 2)     Status: None   Collection Time    09/12/13  3:44 PM      Result Value Ref Range Status   Specimen Description BLOOD LEFT ARM   Final   Special Requests     Final   Value: BOTTLES DRAWN AEROBIC AND ANAEROBIC 10CC BOTH BOTTLES   Culture  Setup Time     Final   Value: 09/13/2013 01:20     Performed at [facility]   Culture     Final   Value:        BLOOD CULTURE RECEIVED NO GROWTH TO DATE CULTURE WILL BE HELD FOR 5 DAYS BEFORE ISSUING A FINAL NEGATIVE REPORT     Performed at [facility]   Report Status PENDING   Incomplete  CULTURE, BLOOD (ROUTINE X 2)  Status: None   Collection Time    09/12/13  4:34 PM      Result Value Ref Range Status   Specimen Description BLOOD LEFT ARM   Final   Special Requests     Final   Value: BOTTLES DRAWN  AEROBIC AND ANAEROBIC 10CC BOTH BOTTLES   Culture  Setup Time     Final   Value: 09/13/2013 01:20     Performed at [facility]   Culture     Final   Value:        BLOOD CULTURE RECEIVED NO GROWTH TO DATE CULTURE WILL BE HELD FOR 5 DAYS BEFORE ISSUING A FINAL NEGATIVE REPORT     Performed at [facility]   Report Status PENDING   Incomplete     Scheduled Meds: . aspirin EC  325 mg Oral Daily  . aztreonam  2 g Intravenous 3 times per day  . buPROPion  150 mg Oral BID  . busPIRone  10 mg Oral BID  . citalopram  10 mg Oral q morning - 10a  . cyclobenzaprine  10 mg Oral BID  . docusate sodium  100 mg Oral BID  . doxepin  10 mg Oral QHS  . esomeprazole  20 mg Oral BID  . iron polysaccharides  150 mg Oral BID  . loratadine  10 mg Oral Daily  . mupirocin ointment   Nasal BID  . oxybutynin  5 mg Oral Daily  . polyethylene glycol  17 g Oral Daily  . saccharomyces boulardii  250 mg Oral BID  . vancomycin  1,250 mg Intravenous Q12H   Continuous Infusions: . sodium chloride 75 mL/hr at 09/15/13 1923  . lactated ringers       Jaamal Farooqui, DO  [facility] Pager [phone]  If 7PM-7AM, please contact night-coverage [email or web address] Password [email or web address] 09/16/2013, 6:42 AM   LOS: 10 days

## 2013-09-16 NOTE — Progress Notes (Signed)
Physical Therapy Treatment Patient Details Name: [name] MRN: [id number] DOB: 05/16/1950 Today's Date: 09/16/2013    History of Present Illness LEFT FEMORAL REVISION -Left.. Pt with history of RAKA.    Pt did have I and D of L hip hematoma 9/2.      PT Comments    Pt feeling much better after I&D yesterday. "Feels much better, was like a watermelon".  Assisted pt OOB via sliding board to WC.  Pt performed WC mobility at [employer]/Supervision assist.  Assisted to recliner via sliding board then performed L LE TE's followed by ICE.    Follow Up Recommendations  SNF [facility])     Equipment Recommendations       Recommendations for Other Services       Precautions / Restrictions Precautions Precautions: Fall Precaution Comments: post precautions, R AKA without prosthesis Restrictions Weight Bearing Restrictions: No LLE Weight Bearing: Weight bearing as tolerated    Mobility  Bed Mobility Overal bed mobility: Needs Assistance Bed Mobility: Supine to Sit     Supine to sit: Min assist Sit to supine: Mod assist   General bed mobility comments: much better bed mobility with decreased c/o L hip pain.  Transfers Overall transfer level: Needs assistance Equipment used:  (sliding board and wheelchair) Transfers: Lateral/Scoot Transfers (sliding board)           General transfer comment: used sliding board lateral scoot towards pt's L from bed to [facility] then again from [facility] to recliner.    Ambulation/Gait                 Hotel manager mobility: Yes Wheelchair propulsion: Both upper extremities Wheelchair parts: Needs assistance Distance: Engineer, manufacturing Details (indicate cue type and reason): <25% VC's on safety with turns and negociating around obsticles.  Modified Rankin (Stroke Patients Only)       Balance     Sitting balance-Leahy Scale: Good Sitting balance - Comments:  balance better this day- able to use BUE for activity in sitting                            Cognition Arousal/Alertness: Awake/alert Behavior During Therapy: WFL for tasks assessed/performed Overall Cognitive Status: Within Functional Limits for tasks assessed                      Exercises  10 reps L LE  AP, knee presses, ABD, SAQ's, LAQ's    General Comments        Pertinent Vitals/Pain Pain Score: 5  Pain Location: l hip Pain Descriptors / Indicators: Sore Pain Intervention(s): Monitored during session;Repositioned;Ice applied    Home Living                      Prior Function            PT Goals (current goals can now be found in the care plan section) Acute Rehab PT Goals Patient Stated Goal: get to the rehab so i can get home Progress towards PT goals: Progressing toward goals    Frequency       PT Plan Current plan remains appropriate    Co-evaluation             End of Session Equipment Utilized During Treatment: Gait belt Activity Tolerance: Patient tolerated treatment well Patient left:  in chair;with call bell/phone within reach     Time: 1340-1420 PT Time Calculation (min): 40 min  Charges:  $Therapeutic Exercise: 8-22 mins $Therapeutic Activity: 8-22 mins $Wheel Chair Management: 8-22 mins                    G Codes:      Rica Koyanagi  PTA [facility]  Acute  Rehab Pager      [phone]

## 2013-09-17 DIAGNOSIS — D62 Acute posthemorrhagic anemia: Secondary | ICD-10-CM

## 2013-09-17 LAB — BASIC METABOLIC PANEL
Anion gap: 10 (ref 5–15)
BUN: 10 mg/dL (ref 6–23)
CHLORIDE: 106 meq/L (ref 96–112)
CO2: 27 mEq/L (ref 19–32)
CREATININE: 0.61 mg/dL (ref 0.50–1.10)
Calcium: 8.4 mg/dL (ref 8.4–10.5)
GFR calc non Af Amer: >90 mL/min (ref >90)
Glucose, Bld: 95 mg/dL (ref 70–99)
Potassium: 3.9 mEq/L (ref 3.7–5.3)
Sodium: 143 mEq/L (ref 137–147)

## 2013-09-17 LAB — CBC
HCT: 20.8 % — ABNORMAL LOW (ref 36.0–46.0)
Hemoglobin: 6.8 g/dL — CL (ref 12.0–15.0)
MCH: 30 pg (ref 26.0–34.0)
MCHC: 32.7 g/dL (ref 30.0–36.0)
MCV: 91.6 fL (ref 78.0–100.0)
Platelets: 368 10*3/uL (ref 150–400)
RBC: 2.27 MIL/uL — ABNORMAL LOW (ref 3.87–5.11)
RDW: 14.4 % (ref 11.5–15.5)
WBC: 11 10*3/uL — ABNORMAL HIGH (ref 4.0–10.5)

## 2013-09-17 LAB — PREPARE RBC (CROSSMATCH)

## 2013-09-17 MED ORDER — ACETAMINOPHEN 325 MG PO TABS
650.0000 mg | ORAL_TABLET | Freq: Once | ORAL | Status: AC
  Administered 2013-09-17: 650 mg via ORAL
  Filled 2013-09-17: qty 2

## 2013-09-17 MED ORDER — SODIUM CHLORIDE 0.9 % IV SOLN
Freq: Once | INTRAVENOUS | Status: AC
  Administered 2013-09-17: 10:00:00 via INTRAVENOUS

## 2013-09-17 MED ORDER — FUROSEMIDE 10 MG/ML IJ SOLN
10.0000 mg | Freq: Once | INTRAMUSCULAR | Status: AC
Start: 2013-09-17 — End: 2013-09-17
  Administered 2013-09-17: 10 mg via INTRAVENOUS
  Filled 2013-09-17: qty 1

## 2013-09-17 MED ORDER — BOOST PLUS PO LIQD
237.0000 mL | Freq: Three times a day (TID) | ORAL | Status: DC
  Administered 2013-09-17 – 2013-09-21 (×10): 237 mL via ORAL
  Filled 2013-09-17 (×13): qty 237

## 2013-09-17 NOTE — Progress Notes (Signed)
Subjective: 2 Days Post-Op Procedure(s) (LRB): IRRIGATION AND DEBRIDEMENT LEFT HIP HEMATOMA (Left) POD 9 Left Femoral Revision Patient reports pain as mild.   Patient seen in rounds by Dr. Wynelle Link.  HGB is down lower today to 6.8.  Patient did sit up yesterday in the chair and felt weak and dizzy just with sitting up.  Will give two units today.  Discussed with patient and she is in agreement.  Will get one unit in before she gets up an [place]. Patient is tired this morning. She did get up yesterday and she states that the hip felt much better after the washout and that she could move it around much easier after the hematoma evacuation.  Her hip felt like "cement" prior to the washout.  She could tell a difference already yesterday. Plan is to go Skilled nursing facility after hospital stay.  Objective: Vital signs in last 24 hours: Temp:  [98.1 F (36.7 C)-98.9 F (37.2 C)] 98.1 F (36.7 C) (09/04 0620) Pulse Rate:  [7-100] 7 (09/04 0620) Resp:  [16] 16 (09/03 2120) BP: (105-120)/(48-61) 120/61 mmHg (09/04 0620) SpO2:  [97 %-100 %] 97 % (09/04 0620)  Intake/Output from previous day:  Intake/Output Summary (Last 24 hours) at 09/17/13 0835 Last data filed at 09/17/13 0700  Gross per 24 hour  Intake 2044.92 ml  Output    850 ml  Net 1194.92 ml    Labs:  Recent Labs  09/15/13 0510 09/16/13 0533 09/17/13 0540  HGB 9.0* 7.7* 6.8*    Recent Labs  09/16/13 0533 09/17/13 0540  WBC 12.2* 11.0*  RBC 2.62* 2.27*  HCT 23.7* 20.8*  PLT 374 368    Recent Labs  09/16/13 0533 09/17/13 0540  NA 140 143  K 4.3 3.9  CL 104 106  CO2 26 27  BUN 10 10  CREATININE 0.55 0.61  GLUCOSE 151* 95  CALCIUM 8.4 8.4   No results found for this basename: LABPT, INR,  in the last 72 hours  EXAM General - Patient is Alert, Appropriate and Oriented Extremity - Neurovascular intact Sensation intact distally Dorsiflexion/Plantar flexion intact Dressing/Incision - clean, scant  drainage noted on the proximal end of the dressing Motor Function - intact, moving foot and toes well on exam.   Past Medical History  Diagnosis Date  . Complication of anesthesia     NAUSEA  . Chest pain     pt states due to esophageal problem -  primary care MD aware   . Hives     since pneumonia vaccine   . Asthma   . Depression   . Anxiety   . Fibromyalgia   . Spondylosis   . Bulging disc   . 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 Phantom limb pain     rt leg  . Leg pain, left   . Eczema     ears  . GERD (gastroesophageal reflux disease)     " alot of burping"  . Constipation, chronic   . Urgency incontinence   . History of transfusion   . Cancer     hx sarcoma rt knee with amputation  . Difficulty sleeping   . Sleep apnea     pt had recent sleep study done but does not know results - report called for and put on chart showing significant obstructive sleep apnea  . Hypertension   . Back pain 08/19/13    low back and  left leg pain due to nerve problems per pt  . PONV (postoperative nausea and vomiting)   . Peripheral neuropathy   . History of bronchitis   . History of pneumonia   . Hiatal hernia   . History of scarlet fever   . Postmenopausal   . History of measles   . History of mumps   . History of rubella     Assessment/Plan: 2 Days Post-Op Procedure(s) (LRB): IRRIGATION AND DEBRIDEMENT LEFT HIP HEMATOMA (Left) Active Problems:   Fibromyalgia   GERD (gastroesophageal reflux disease)   Postoperative anemia due to acute blood loss   Depression   Anxiety state, unspecified   Phantom limb (syndrome)   Obstructive sleep apnea   Hyponatremia   Transfusion history   Dislocation of hip joint prosthesis   HTN (hypertension)   Left hip pain   Femoral loosening of prosthetic left hip   SIRS (systemic inflammatory response syndrome)  Estimated body mass index is 26.88 kg/(m^2) as calculated from the following:   Height as of this encounter: 5\' 2"   (1.575 m).   Weight as of this encounter: 66.679 kg (147 lb).  Blood today - two units today. Recheck labs in the morning. OOB after blood.  DVT Prophylaxis - Aspirin Weight Bearing As Tolerated left Leg  Arlee Muslim, PA-C Orthopaedic Surgery 09/17/2013, 8:35 AM

## 2013-09-17 NOTE — Progress Notes (Signed)
INITIAL NUTRITION ASSESSMENT  DOCUMENTATION CODES Per approved criteria  -Not Applicable   INTERVENTION: Boost Plus tid Provide patient preferences and foods best tolerated with sore mouth. RD to follow.  NUTRITION DIAGNOSIS: Inadequate oral intake related to sore mouth as evidenced by patient report..   Goal: Patient to meet >90% estimated needs with meals and supplements.  Monitor:  Diet tolerance and intake, labs, weight trend.  Reason for Assessment: Length of Stay.  63 y.o. female  Admitting Dx: <principal problem not specified>  ASSESSMENT: Patient s/p left total hip replacement 07/28/13, Non displace crack in the greater trochanter region 08/06/13 with subsequant ORIF 08/21/13, left femoral revision 09/08/13, and irrigation and debridement of left hip hematoma 09/08/13. 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  9/4:   Complains of sore mouth thought secondary to virus on Magic Mouthwash. Weight loss of 8 lbs in the last month (5%). Trying to eat despite sore mouth with 75% intake.   Reports good intake at the [facility] until 2 weeks ago.  States that she cannot remember anything secondary to increased pain meds during this time. Drinks Boost   Height: Ht Readings from Last 1 Encounters:  09/06/13 5\' 2"  (1.575 m)    Weight: Wt Readings from Last 1 Encounters:  09/06/13 147 lb (66.679 kg)    Ideal Body Weight: 95 lbs  % Ideal Body Weight: 155  Wt Readings from Last 10 Encounters:  09/06/13 147 lb (66.679 kg)  09/06/13 147 lb (66.679 kg)  09/06/13 147 lb (66.679 kg)  08/19/13 155 lb (70.308 kg)  08/19/13 155 lb (70.308 kg)  08/04/13 157 lb 3 oz (71.3 kg)  07/28/13 154 lb (69.854 kg)  07/28/13 154 lb (69.854 kg)  07/12/13 154 lb 1.6 oz (69.9 kg)  07/12/13 155 lb (70.308 kg)    Usual Body Weight: 155 lbs  % Usual Body Weight: 95  BMI:  31  Estimated Nutritional Needs: Kcal: 1600-1700 Protein: 80-90 gm  Fluid: >/=1.7L  Skin: incision  Diet Order:  General  EDUCATION NEEDS: -Education needs addressed   Intake/Output Summary (Last 24 hours) at 09/17/13 1400 Last data filed at 09/17/13 0921  Gross per 24 hour  Intake 1429.67 ml  Output    700 ml  Net 729.67 ml      Labs:   Recent Labs Lab 09/15/13 0510 09/16/13 0533 09/17/13 0540  NA 139 140 143  K 3.9 4.3 3.9  CL 102 104 106  CO2 26 26 27   BUN 11 10 10   CREATININE 0.62 0.55 0.61  CALCIUM 8.5 8.4 8.4  GLUCOSE 103* 151* 95    CBG (last 3)  No results found for this basename: GLUCAP,  in the last 72 hours  Scheduled Meds: . aspirin EC  325 mg Oral Daily  . aztreonam  2 g Intravenous 3 times per day  . buPROPion  150 mg Oral BID  . busPIRone  10 mg Oral BID  . citalopram  10 mg Oral q morning - 10a  . cyclobenzaprine  10 mg Oral BID  . docusate sodium  100 mg Oral BID  . doxepin  10 mg Oral QHS  . esomeprazole  20 mg Oral BID  . furosemide  10 mg Intravenous Once  . iron polysaccharides  150 mg Oral BID  . lactose free nutrition  237 mL Oral TID WC  . loratadine  10 mg Oral Daily  . magic mouthwash  5 mL Oral QID  . mupirocin ointment   Nasal BID  .  oxybutynin  5 mg Oral Daily  . polyethylene glycol  17 g Oral Daily  . saccharomyces boulardii  250 mg Oral BID  . vancomycin  1,250 mg Intravenous Q12H    Continuous Infusions: . sodium chloride 20 mL/hr at 09/16/13 1133  . lactated ringers      Past Medical History  Diagnosis Date  . Complication of anesthesia     NAUSEA  . Chest pain     pt states due to esophageal problem -  primary care MD aware   . Hives     since pneumonia vaccine   . Asthma   . Depression   . Anxiety   . Fibromyalgia   . Spondylosis   . Bulging disc   . 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 Phantom limb pain     rt leg  . Leg pain, left   . Eczema     ears  . GERD (gastroesophageal reflux disease)     " alot of burping"  . Constipation, chronic   . Urgency incontinence   . History of transfusion   .  Cancer     hx sarcoma rt knee with amputation  . Difficulty sleeping   . Sleep apnea     pt had recent sleep study done but does not know results - report called for and put on chart showing significant obstructive sleep apnea  . Hypertension   . Back pain 08/19/13    low back and  left leg pain due to nerve problems per pt  . PONV (postoperative nausea and vomiting)   . Peripheral neuropathy   . History of bronchitis   . History of pneumonia   . Hiatal hernia   . History of scarlet fever   . Postmenopausal   . History of measles   . History of mumps   . History of rubella     Past Surgical History  Procedure Laterality Date  . Abdominal hysterectomy  1980'S  . Sarcoma  1973    RT KNEE (aka)  . External ear surgery  AGE 70  . 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 Orif periprosthetic fracture Left 08/21/2013    Procedure: OPEN REDUCTION INTERNAL FIXATION (ORIF) PERIPROSTHETIC FRACTURE WITH FEMORAL REVISION, LEFT FEMUR;  Surgeon: [name], MD;  Location: [facility];  Service: Orthopedics;  Laterality: Left;  . Back surgery    . Femoral revision Left 09/08/2013    Procedure: LEFT FEMORAL REVISION;  Surgeon: [name], MD;  Location: [facility];  Service: Orthopedics;  Laterality: Left;  . Incision and drainage hip Left 09/15/2013    Procedure: IRRIGATION AND DEBRIDEMENT LEFT HIP HEMATOMA;  Surgeon: [name], MD;  Location: [facility];  Service: Orthopedics;  Laterality: Left;    Antonieta Iba, RD, LDN Clinical Inpatient Dietitian Pager:  [phone] Weekend and after hours pager:  [phone]

## 2013-09-17 NOTE — Progress Notes (Signed)
PROGRESS NOTE  [name] JYN:[id number] DOB: [birth year] DOA: 09/06/2013 PCP: Shary Key, MD  Assessment/Plan: Left Hip Hematoma -09/15/13 evacuation -follow culture -if culture is positive, may need to consider infected prosthesis in the setting of SIRS previously -if culture remains neg in next 24 hrs--plan to d/c abx and observe off SIRS  -secondary to possible wound infection  -09/15/2013 L-hip I&D-->hematoma-->await cutlures  - Repeat CXR and UA negative  - 8/30 Blood cultures NGTD  - Continue vancomycin and aztreonam  - hemovac "fell out" evening of 09/15/13  -plan to d/c abx if stable and cultures remain neg and observe  Left leg hip prosthesis dislocation  - s/p left hip prosthesis revision on 8/26 by Dr. Wynelle Link  - DVT proph with xarelto--restart when okay with ortho  - Weight bearing status per orthopedics  Leukocytosis,  -trending down, slightly up from surgery 9/2--likely stress demargination  - continue abx as above  - repeat WBC in AM  -remains afebrile and hemodynamically stable  Acute blood loss anemia  -from recent surgery superimposed on iron deficiency and chronic disease.  - hgb improved post transfusion.  - Continue iron supplements.  - Transfused 2 units PRBC intraoperatively on 8/26.  - Transfused 1 unit PRBC 8/28 - Transfused 2 units PRBC 09/17/13  Acute hypoxic respiratory failure  -likely due to atelectasis, spirometry use post-operative,  -resolved.  HTN (hypertension),  -blood pressure low normal. Continue to hold irbesartan.  Fibromyalgia, continue prn dilaudid and celexa  GERD (gastroesophageal reflux disease), stable, continue nexium  Depression/Anxiety, stable, continue wellbutrin, celexa, and buspar, and continue increased frequency of prn ativan. Needs close follow up with primary psychiatrist  Pruritis with history of hives being managed by dermatologist, improved. Continue loratadine, prn benadryl, and doxepin    Hyponatremia,  -may be due to dehydration and fluid shifts post large surgery, mild and asymptomatic.  -improved with IVF  Thrombocytosis,  -reactive from femur fracture  -resolved  Family Communication: Pt at beside  Disposition Plan: [facility] when stable  Consultants:  Ortho. Dr. Wynelle Link Procedures:  Left femoral revision on 09/08/13  Left femoral I&D/washout--09/15/13 Antibiotics:  Perioperative Vancomycin  Vancomycin 8/30 >>  Aztreonam 8/30 >>         Procedures/Studies: Dg Hip Complete Left  09/06/2013   CLINICAL DATA:  Left leg pain  EXAM: LEFT HIP - COMPLETE 2+ VIEW  COMPARISON:  None.  FINDINGS: There is a left total hip arthroplasty. There is superior dislocation of the left femoral head component relative to the acetabular component. There is no acute fracture.  There is generalized osteopenia.  IMPRESSION: Left total hip arthroplasty with superior dislocation of the left femoral head component relative to the acetabulum.   Electronically Signed   By: Kathreen Devoid   On: 09/06/2013 18:25   Dg Femur Left  09/06/2013   CLINICAL DATA:  Increasing LEFT leg pain on Thursday, post hip replacement in July, hip injury  EXAM: LEFT FEMUR - 2 VIEW  COMPARISON:  Preoperative exam 07/12/2013  FINDINGS: Osseous demineralization.  LEFT hip prosthesis identified with superior dislocation of the femoral component.  Cerclage wires and lateral plate at the proximal LEFT femur appear intact.  No periprosthetic lucency or fracture identified.  Visualized pelvis intact.  Knee joint spaces preserved.  IMPRESSION: Dislocated LEFT hip prosthesis.  Osseous demineralization.   Electronically Signed   By: Lavonia Dana M.D.   On: 09/06/2013 18:24   Dg Pelvis Portable  09/08/2013   CLINICAL DATA:  Status post left hip surgery.  EXAM: PORTABLE PELVIS 1-2 VIEWS  COMPARISON:  08/21/2013.  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 A new left total hip prosthesis is demonstrated with interval discontinuity of the upper cerclage wire. No acute fracture or dislocation seen.  IMPRESSION: Interval replacement of the left hip prosthesis with discontinuity of the upper cerclage wire.   Electronically Signed   By: Enrique Sack M.D.   On: 09/08/2013 20:31   Dg Pelvis Portable  08/21/2013   CLINICAL DATA:  Postop ORIF left hip fracture  EXAM: PORTABLE PELVIS 1-2 VIEWS  COMPARISON:  07/28/2013  FINDINGS: Cerclage wire fixation of the left greater trochanter.  Left total hip arthroplasty.  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  Visualized bony pelvis appears intact.  IMPRESSION: Cerclage wire fixation of the left greater trochanter.  Left total hip arthroplasty.   Electronically Signed   By: Julian Hy M.D.   On: 08/21/2013 14:32   Dg Chest Port 1 View  09/12/2013   CLINICAL DATA:  63 year old female with fever, cough and congestion.  EXAM: PORTABLE CHEST - 1 VIEW  COMPARISON:  09/10/2013 and prior radiographs dating back to 07/12/2013.  FINDINGS: The cardiomediastinal silhouette is unremarkable.  There is no evidence of focal airspace disease, pulmonary edema, suspicious pulmonary nodule/mass, pleural effusion, or pneumothorax. No acute bony abnormalities are identified.  IMPRESSION: No active disease.   Electronically Signed   By: Hassan Rowan M.D.   On: 09/12/2013 13:46   Dg Chest Port 1 View  09/10/2013   CLINICAL DATA:  Elevated white blood count, cough, shortness of breath  EXAM: PORTABLE CHEST - 1 VIEW  COMPARISON:  07/12/2013  FINDINGS: The heart size and mediastinal contours are within normal limits. Both lungs are clear. The visualized skeletal structures are unremarkable.  IMPRESSION: No active disease.   Electronically Signed   By: Skipper Cliche M.D.   On: 09/10/2013 07:41         Subjective: Patient denies fevers, chills, headache, chest pain, dyspnea, nausea, vomiting,  diarrhea, abdominal pain, dysuria, hematuria   Objective: Filed Vitals:   09/17/13 1425 09/17/13 1449 09/17/13 1832 09/17/13 1855  BP: 119/54 108/47 119/56 121/57  Pulse: 87 89 85 83  Temp: 98.4 F (36.9 C) 98.8 F (37.1 C) 99.8 F (37.7 C) 98.1 F (36.7 C)  TempSrc: Oral Oral Oral Oral  Resp: 14 14 14 14   Height:      Weight:      SpO2: 99% 98% 99% 99%    Intake/Output Summary (Last 24 hours) at 09/17/13 1926 Last data filed at 09/17/13 1900  Gross per 24 hour  Intake 1514.67 ml  Output    700 ml  Net 814.67 ml   Weight change:  Exam:   General:  Pt is alert, follows commands appropriately, not in acute distress  HEENT: No icterus, No thrush, [place]/AT  Cardiovascular: RRR, S1/S2, no rubs, no gallops  Respiratory: CTA bilaterally, no wheezing, no crackles, no rhonchi  Abdomen: Soft/+BS, non tender, non distended, no guarding  Extremities: trace LE edema, No lymphangitis, No petechiae, No rashes, no synovitis  Data Reviewed: Basic Metabolic Panel:  Recent Labs Lab 09/14/13 0520 09/15/13 0510 09/16/13 0533 09/17/13 0540  NA 135* 139 140 143  K 3.7 3.9 4.3 3.9  CL 99 102 104 106  CO2 26 26  26 27  GLUCOSE 105* 103* 151* 95  BUN 10 11 10 10   CREATININE 0.67 0.62 0.55 0.61  CALCIUM 8.5 8.5 8.4 8.4   Liver Function Tests: No results found for this basename: AST, ALT, ALKPHOS, BILITOT, PROT, ALBUMIN,  in the last 168 hours No results found for this basename: LIPASE, AMYLASE,  in the last 168 hours No results found for this basename: AMMONIA,  in the last 168 hours CBC:  Recent Labs Lab 09/13/13 0745 09/14/13 0520 09/15/13 0510 09/16/13 0533 09/17/13 0540  WBC 12.4* 11.4* 11.1* 12.2* 11.0*  NEUTROABS 8.0*  --   --   --   --   HGB 9.5* 9.2* 9.0* 7.7* 6.8*  HCT 29.1* 27.5* 27.7* 23.7* 20.8*  MCV 90.4 88.1 90.2 90.5 91.6  PLT 253 297 333 374 368   Cardiac Enzymes: No results found for this basename: CKTOTAL, CKMB, CKMBINDEX, TROPONINI,  in the last  168 hours BNP: No components found with this basename: POCBNP,  CBG: No results found for this basename: GLUCAP,  in the last 168 hours  Recent Results (from the past 240 hour(s))  URINE CULTURE     Status: None   Collection Time    09/12/13  3:03 PM      Result Value Ref Range Status   Specimen Description URINE, CLEAN CATCH   Final   Special Requests NONE   Final   Culture  Setup Time     Final   Value: 09/13/2013 02:43     Performed at [facility]     Final   Value: 40,000 COLONIES/ML     Performed at [facility]     Final   Value: KLEBSIELLA PNEUMONIAE     Performed at [facility]   Report Status 09/15/2013 FINAL   Final   Organism ID, Bacteria KLEBSIELLA PNEUMONIAE   Final  CULTURE, BLOOD (ROUTINE X 2)     Status: None   Collection Time    09/12/13  3:44 PM      Result Value Ref Range Status   Specimen Description BLOOD LEFT ARM   Final   Special Requests     Final   Value: BOTTLES DRAWN AEROBIC AND ANAEROBIC 10CC BOTH BOTTLES   Culture  Setup Time     Final   Value: 09/13/2013 01:20     Performed at [facility]   Culture     Final   Value:        BLOOD CULTURE RECEIVED NO GROWTH TO DATE CULTURE WILL BE HELD FOR 5 DAYS BEFORE ISSUING A FINAL NEGATIVE REPORT     Performed at [facility]   Report Status PENDING   Incomplete  CULTURE, BLOOD (ROUTINE X 2)     Status: None   Collection Time    09/12/13  4:34 PM      Result Value Ref Range Status   Specimen Description BLOOD LEFT ARM   Final   Special Requests     Final   Value: BOTTLES DRAWN AEROBIC AND ANAEROBIC 10CC BOTH BOTTLES   Culture  Setup Time     Final   Value: 09/13/2013 01:20     Performed at [facility]   Culture     Final   Value:        BLOOD CULTURE RECEIVED NO GROWTH TO DATE CULTURE WILL BE HELD FOR 5 DAYS BEFORE ISSUING A FINAL NEGATIVE REPORT  Performed at [facility]   Report Status PENDING   Incomplete    ANAEROBIC CULTURE     Status: None   Collection Time    09/15/13  5:51 PM      Result Value Ref Range Status   Specimen Description ABSCESS LEFT HIP HEMATOMA   Final   Special Requests PATIENT ON FOLLOWING VANCOMYCIN   Final   Gram Stain     Final   Value: FEW WBC PRESENT,BOTH PMN AND MONONUCLEAR     NO SQUAMOUS EPITHELIAL CELLS SEEN     NO ORGANISMS SEEN     Performed at [facility]   Culture     Final   Value: NO ANAEROBES ISOLATED; CULTURE IN PROGRESS FOR 5 DAYS     Performed at [facility]   Report Status PENDING   Incomplete  CULTURE, ROUTINE-ABSCESS     Status: None   Collection Time    09/15/13  5:51 PM      Result Value Ref Range Status   Specimen Description ABSCESS LEFT HIP HEMATOMA   Final   Special Requests PATIENT ON FOLLOWING VANCOMYCIN   Final   Gram Stain     Final   Value: FEW WBC PRESENT,BOTH PMN AND MONONUCLEAR     NO SQUAMOUS EPITHELIAL CELLS SEEN     NO ORGANISMS SEEN     Performed at [facility]     Final   Value: Culture reincubated for better growth     Performed at [facility]   Report Status PENDING   Incomplete     Scheduled Meds: . aspirin EC  325 mg Oral Daily  . aztreonam  2 g Intravenous 3 times per day  . buPROPion  150 mg Oral BID  . busPIRone  10 mg Oral BID  . citalopram  10 mg Oral q morning - 10a  . cyclobenzaprine  10 mg Oral BID  . docusate sodium  100 mg Oral BID  . doxepin  10 mg Oral QHS  . esomeprazole  20 mg Oral BID  . iron polysaccharides  150 mg Oral BID  . lactose free nutrition  237 mL Oral TID WC  . loratadine  10 mg Oral Daily  . magic mouthwash  5 mL Oral QID  . mupirocin ointment   Nasal BID  . oxybutynin  5 mg Oral Daily  . polyethylene glycol  17 g Oral Daily  . saccharomyces boulardii  250 mg Oral BID  . vancomycin  1,250 mg Intravenous Q12H   Continuous Infusions: . sodium chloride 20 mL/hr at 09/16/13 1133  . lactated ringers       Bianka Liberati, DO  [facility] Pager [phone]  If 7PM-7AM, please contact night-coverage [email or web address] Password [email or web address] 09/17/2013, 7:26 PM   LOS: 11 days

## 2013-09-17 NOTE — Progress Notes (Signed)
ANTIBIOTIC CONSULT NOTE - INITIAL  Pharmacy Consult for Vancomycin/Aztreonam Indication: Sepsis  Assessment: 63yoF underwent L femoral revision on 5/03 complicated by SIRS and possible wound infection. Fever resolving on vancomycin and aztreonam. No pneumonia or UTI to explain symptoms. Surgical site re-opened on 9/2 but appeared to be only hematoma; however aerobic/anaerobic cultures were still drawn.  Per MD, plan to d/c abx and observe if patient stable and cultures remain negative.  WBC just above ULN Afebrile since 9/1 Renal function remains intact; CrCl 65 ml/min  BCx 8/30: NGTD x2 UCx 8/30: 40,000 K. Pneumoniae Sgx site Cx 9/2: pending  Goal of Therapy:  Vancomycin trough level 15-20 mcg/ml Eradication of infection; appropriate renal dosing of antibiotics  Plan:   Continue Vancomycin 1250 mg IV q12 and Aztreonam 2 g IV q8h for today.  Would discuss need for continued vancomycin with MD tomorrow as long as clinically stable  Measure antibiotic drug levels at steady state  Follow up culture results  ------------------------------------------------------------------------------------------------------------ Significant events:  Vancomycin 8/30 >> D6 Aztreonam 8/30 >> D6  8/30: vancomycin 750 mg IV q12 9/2: trough  8.4; increased to 1250 q12   Allergies  Allergen Reactions  . Bactrim [Sulfamethoxazole-Tmp Ds] Shortness Of Breath  . Cefuroxime Shortness Of Breath  . Omnipaque [Iohexol] Shortness Of Breath    Patient stated that she had a reaction a few years ago to the IV dye and had trouble breathing--so for her CT Angio Chest today (07/12/13), ER MD gave her solumedrol and Benadryl before she had test and did very well with that.  ak 07/12/13  . Pneumococcal Vaccines Hives    Hives over entire body after vaccine  . Penicillins Diarrhea  . Sulfa Antibiotics Other (See Comments)    unknown    Patient Measurements: Height: 5\' 2"  (157.5 cm) Weight: 147 lb (66.679  kg) IBW/kg (Calculated) : 50.1 Adjusted Body Weight: 57 kg  Vital Signs: Temp: 98.1 F (36.7 C) (09/04 1020) Temp src: Oral (09/04 1020) BP: 114/53 mmHg (09/04 1020) Pulse Rate: 86 (09/04 1020) Intake/Output from previous day: 09/03 0701 - 09/04 0700 In: 2954.9 [P.O.:1080; I.V.:924.9; IV Piggyback:950] Out: 850 [Urine:850] Intake/Output from this shift: Total I/O In: 360 [P.O.:360] Out: -   Labs:  Recent Labs  09/15/13 0510 09/16/13 0533 09/17/13 0540  WBC 11.1* 12.2* 11.0*  HGB 9.0* 7.7* 6.8*  PLT 333 374 368  CREATININE 0.62 0.55 0.61   Estimated Creatinine Clearance: 65.3 ml/min (by C-G formula based on Cr of 0.61).  Recent Labs  09/15/13 1537  VANCOTROUGH 8.4*     Microbiology: Recent Results (from the past 720 hour(s))  URINE CULTURE     Status: None   Collection Time    08/19/13  6:37 PM      Result Value Ref Range Status   Specimen Description URINE, CATHETERIZED   Final   Special Requests Normal   Final   Culture  Setup Time     Final   Value: 08/19/2013 20:24     Performed at [facility]     Final   Value: NO GROWTH     Performed at [facility]   Culture     Final   Value: NO GROWTH     Performed at [facility]   Report Status 08/20/2013 FINAL   Final  SURGICAL PCR SCREEN     Status: Abnormal   Collection Time    08/21/13  6:10 AM  Result Value Ref Range Status   MRSA, PCR NEGATIVE  NEGATIVE Final   Staphylococcus aureus POSITIVE (*) NEGATIVE Final   Comment:            The Xpert SA Assay (FDA     approved for NASAL specimens     in patients over 45 years of age),     is one component of     a comprehensive surveillance     program.  Test performance has     been validated by [employer] for patients greater     than or equal to 29 year old.     It is not intended     to diagnose infection nor to     guide or monitor treatment.  SURGICAL PCR SCREEN     Status: Abnormal   Collection  Time    09/06/13 10:12 PM      Result Value Ref Range Status   MRSA, PCR NEGATIVE  NEGATIVE Final   Staphylococcus aureus POSITIVE (*) NEGATIVE Final   Comment:            The Xpert SA Assay (FDA     approved for NASAL specimens     in patients over 26 years of age),     is one component of     a comprehensive surveillance     program.  Test performance has     been validated by [employer] for patients greater     than or equal to 36 year old.     It is not intended     to diagnose infection nor to     guide or monitor treatment.  URINE CULTURE     Status: None   Collection Time    09/12/13  3:03 PM      Result Value Ref Range Status   Specimen Description URINE, CLEAN CATCH   Final   Special Requests NONE   Final   Culture  Setup Time     Final   Value: 09/13/2013 02:43     Performed at [place]     Final   Value: 40,000 COLONIES/ML     Performed at [facility]   Culture     Final   Value: KLEBSIELLA PNEUMONIAE     Performed at [facility]   Report Status 09/15/2013 FINAL   Final   Organism ID, Bacteria KLEBSIELLA PNEUMONIAE   Final  CULTURE, BLOOD (ROUTINE X 2)     Status: None   Collection Time    09/12/13  3:44 PM      Result Value Ref Range Status   Specimen Description BLOOD LEFT ARM   Final   Special Requests     Final   Value: BOTTLES DRAWN AEROBIC AND ANAEROBIC 10CC BOTH BOTTLES   Culture  Setup Time     Final   Value: 09/13/2013 01:20     Performed at [facility]   Culture     Final   Value:        BLOOD CULTURE RECEIVED NO GROWTH TO DATE CULTURE WILL BE HELD FOR 5 DAYS BEFORE ISSUING A FINAL NEGATIVE REPORT     Performed at [facility]   Report Status PENDING   Incomplete  CULTURE, BLOOD (ROUTINE X 2)     Status: None   Collection Time    09/12/13  4:34 PM  Result Value Ref Range Status   Specimen Description BLOOD LEFT ARM   Final   Special Requests     Final   Value: BOTTLES  DRAWN AEROBIC AND ANAEROBIC 10CC BOTH BOTTLES   Culture  Setup Time     Final   Value: 09/13/2013 01:20     Performed at [facility]   Culture     Final   Value:        BLOOD CULTURE RECEIVED NO GROWTH TO DATE CULTURE WILL BE HELD FOR 5 DAYS BEFORE ISSUING A FINAL NEGATIVE REPORT     Performed at [facility]   Report Status PENDING   Incomplete  ANAEROBIC CULTURE     Status: None   Collection Time    09/15/13  5:51 PM      Result Value Ref Range Status   Specimen Description ABSCESS LEFT HIP HEMATOMA   Final   Special Requests PATIENT ON FOLLOWING VANCOMYCIN   Final   Gram Stain     Final   Value: FEW WBC PRESENT,BOTH PMN AND MONONUCLEAR     NO SQUAMOUS EPITHELIAL CELLS SEEN     NO ORGANISMS SEEN     Performed at [facility]   Culture     Final   Value: NO ANAEROBES ISOLATED; CULTURE IN PROGRESS FOR 5 DAYS     Performed at [facility]   Report Status PENDING   Incomplete  CULTURE, ROUTINE-ABSCESS     Status: None   Collection Time    09/15/13  5:51 PM      Result Value Ref Range Status   Specimen Description ABSCESS LEFT HIP HEMATOMA   Final   Special Requests PATIENT ON FOLLOWING VANCOMYCIN   Final   Gram Stain     Final   Value: FEW WBC PRESENT,BOTH PMN AND MONONUCLEAR     NO SQUAMOUS EPITHELIAL CELLS SEEN     NO ORGANISMS SEEN     Performed at [facility]   Culture     Final   Value: Culture reincubated for better growth     Performed at [facility]   Report Status PENDING   Incomplete    Medical History: Past Medical History  Diagnosis Date  . Complication of anesthesia     NAUSEA  . Chest pain     pt states due to esophageal problem -  primary care MD aware   . Hives     since pneumonia vaccine   . Asthma   . Depression   . Anxiety   . Fibromyalgia   . Spondylosis   . Bulging disc   . 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 Phantom limb pain     rt leg  . Leg pain, left   . Eczema     ears   . GERD (gastroesophageal reflux disease)     " alot of burping"  . Constipation, chronic   . Urgency incontinence   . History of transfusion   . Cancer     hx sarcoma rt knee with amputation  . Difficulty sleeping   . Sleep apnea     pt had recent sleep study done but does not know results - report called for and put on chart showing significant obstructive sleep apnea  . Hypertension   . Back pain 08/19/13    low back and  left leg pain due to nerve problems per  pt  . PONV (postoperative nausea and vomiting)   . Peripheral neuropathy   . History of bronchitis   . History of pneumonia   . Hiatal hernia   . History of scarlet fever   . Postmenopausal   . History of measles   . History of mumps   . History of rubella     Medications:  Anti-infectives   Start     Dose/Rate Route Frequency Ordered Stop   09/16/13 0500  vancomycin (VANCOCIN) 1,250 mg in sodium chloride 0.9 % 250 mL IVPB     1,250 mg 166.7 mL/hr over 90 Minutes Intravenous Every 12 hours 09/15/13 1716     09/13/13 0500  vancomycin (VANCOCIN) IVPB 750 mg/150 ml premix  Status:  Discontinued     750 mg 150 mL/hr over 60 Minutes Intravenous Every 12 hours 09/12/13 1649 09/15/13 1715   09/12/13 1700  vancomycin (VANCOCIN) 1,500 mg in sodium chloride 0.9 % 500 mL IVPB     1,500 mg 250 mL/hr over 120 Minutes Intravenous  Once 09/12/13 1649 09/12/13 2000   09/12/13 1645  vancomycin (VANCOCIN) IVPB 750 mg/150 ml premix  Status:  Discontinued     750 mg 150 mL/hr over 60 Minutes Intravenous Every 12 hours 09/12/13 1636 09/12/13 1647   09/12/13 1645  aztreonam (AZACTAM) 2 g in dextrose 5 % 50 mL IVPB     2 g 100 mL/hr over 30 Minutes Intravenous 3 times per day 09/12/13 1636     09/09/13 0600  vancomycin (VANCOCIN) IVPB 1000 mg/200 mL premix     1,000 mg 200 mL/hr over 60 Minutes Intravenous Every 12 hours 09/08/13 2138 09/09/13 0725   09/08/13 0800  vancomycin (VANCOCIN) IVPB 1000 mg/200 mL premix     1,000 mg 200  mL/hr over 60 Minutes Intravenous  Once 09/07/13 0816 09/08/13 1805   09/08/13 0709  vancomycin (VANCOCIN) IVPB 1000 mg/200 mL premix  Status:  Discontinued     1,000 mg 200 mL/hr over 60 Minutes Intravenous On call to O.R. 09/08/13 0709 09/08/13 2137      Reuel Boom, PharmD Pager: [phone] 09/17/2013, 11:00 AM

## 2013-09-17 NOTE — Progress Notes (Signed)
CSW assisting with d/c planning. [facility] is able to accept pt over the weekend if stable for d/c. [employer] has provided authorization for weekend d/c. Weekend CSW will assist, as needed.  Werner Lean LCSW [phone]

## 2013-09-18 DIAGNOSIS — T84039A Mechanical loosening of unspecified internal prosthetic joint, initial encounter: Principal | ICD-10-CM

## 2013-09-18 LAB — TYPE AND SCREEN
ABO/RH(D): O POS
Antibody Screen: NEGATIVE
Unit division: 0
Unit division: 0

## 2013-09-18 LAB — CBC
HCT: 29.3 % — ABNORMAL LOW (ref 36.0–46.0)
Hemoglobin: 9.8 g/dL — ABNORMAL LOW (ref 12.0–15.0)
MCH: 29.5 pg (ref 26.0–34.0)
MCHC: 33.4 g/dL (ref 30.0–36.0)
MCV: 88.3 fL (ref 78.0–100.0)
PLATELETS: 459 10*3/uL — AB (ref 150–400)
RBC: 3.32 MIL/uL — ABNORMAL LOW (ref 3.87–5.11)
RDW: 14.8 % (ref 11.5–15.5)
WBC: 12.3 10*3/uL — ABNORMAL HIGH (ref 4.0–10.5)

## 2013-09-18 LAB — CULTURE, ROUTINE-ABSCESS

## 2013-09-18 LAB — BASIC METABOLIC PANEL
ANION GAP: 10 (ref 5–15)
BUN: 11 mg/dL (ref 6–23)
CO2: 27 mEq/L (ref 19–32)
CREATININE: 0.56 mg/dL (ref 0.50–1.10)
Calcium: 8.4 mg/dL (ref 8.4–10.5)
Chloride: 105 mEq/L (ref 96–112)
Glucose, Bld: 87 mg/dL (ref 70–99)
POTASSIUM: 3.5 meq/L — AB (ref 3.7–5.3)
Sodium: 142 mEq/L (ref 137–147)

## 2013-09-18 MED ORDER — SODIUM CHLORIDE 0.9 % IV SOLN
1.0000 g | INTRAVENOUS | Status: DC
  Administered 2013-09-18 – 2013-09-20 (×3): 1 g via INTRAVENOUS
  Filled 2013-09-18 (×4): qty 1

## 2013-09-18 NOTE — Progress Notes (Addendum)
Physical Therapy Treatment Patient Details Name: JAQUAY MORNEAULT MRN: [id number] DOB: 08-May-1950 Today's Date: 09/18/2013    History of Present Illness LEFT FEMORAL REVISION -Left.. Pt with history of RAKA.    Pt did have I and D of L hip hematoma 9/2.      PT Comments    Assisted pt back to bed via sliding board when found pad under pt to have mod serous/bloody drainage.  Notified RN.  Pt was sitting in wheelchair for approx 4 hours.  Assisted RN with side to side rolling to change pad and dressing.  Positioned pt on her side with pillows and applied ICE pack to L hip.      Follow Up Recommendations        Equipment Recommendations       Recommendations for Other Services       Precautions / Restrictions Precautions Precautions: Fall Restrictions Weight Bearing Restrictions: No LLE Weight Bearing: Weight bearing as tolerated    Mobility  Bed Mobility Overal bed mobility: Needs Assistance Bed Mobility: Supine to Sit     Supine to sit: Min assist     General bed mobility comments: much better bed mobility with decreased c/o L hip pain.  Transfers Overall transfer level: Needs assistance Equipment used:  (sliding board) Transfers: Lateral/Scoot Transfers          Lateral/Scoot Transfers: Mod assist General transfer comment: used sliding board lateral scoot towards pt's L from wheelchair to bed.  Used bed pad under board to asssit.  Ambulation/Gait             General Gait Details: Unable to attempt this session.    Stairs             Modified Rankin (Stroke Patients Only)       Balance                                    Cognition                            Exercises      General Comments        Pertinent Vitals/Pain Pain Assessment: 0-10 Pain Score: 5  Pain Location: L hip Pain Descriptors / Indicators: Sore;Constant;Discomfort;Tightness;Other (Comment) ("huge") Pain Intervention(s): Ice  applied;Repositioned    Home Living                      Prior Function            PT Goals (current goals can now be found in the care plan section) Progress towards PT goals: Progressing toward goals    Frequency       PT Plan      Co-evaluation             End of Session           Time: 5974-1638 PT Time Calculation (min): 14 min  Charges:  $Therapeutic Activity: 8-22 mins                     G Codes:      Rica Koyanagi  PTA [facility]  Acute  Rehab Pager      [phone]

## 2013-09-18 NOTE — Progress Notes (Signed)
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 The patient initially had a left total hip arthroplasty on 07/28/2013.  She was in rehabilitation 4 days prior to admission when she experienced a muscle pull-like feeling while getting on the bedpan. She had persistent pain and was found to have a dislocation of her prosthesis. She underwent repair on 8/26, however, her post-operative course has been complicated by SIRS and possible wound infection. 2 days after her revision, the patient developed leukocytosis and fever. She was started on vancomycin and aztreonam on 09/12/13, but she has had intermittent doses of vancomycin prior to that date. After antibiotics were started, the patient defervesced and the baby seems improved. The patient ultimately underwent evacuation of her hematoma on 09/15/2013. Intraoperative cultures on 09/15/2013 were polymicrobial without predominant organism or Staphylococcus aureus which raised concern about a possible prosthetic hip infection. As a result, infectious disease was consulted.  Assessment/Plan: Left Hip Hematoma  -09/15/13 evacuation  -intraoperative culture=polymicrobial without S. aureus -remain concerned about possible infected prosthesis -consulted ID--discussed with Dr. Milana Na ertapenem for now SIRS  -secondary to possible wound infection  -09/15/2013 L-hip I&D-->hematoma-->await cutlures  - Repeat CXR and UA negative  - 8/30 Blood cultures NGTD  - Continue vancomycin and aztreonam  - hemovac "fell out" evening of 09/15/13  Left leg hip prosthesis dislocation  -original L-THA on 07/28/13 - s/p left hip prosthesis revision on 8/26 by Dr. Wynelle Link  - DVT proph with xarelto--restart  when okay with ortho  - Weight bearing status per orthopedics  Leukocytosis,  -trending down, slightly up from surgery 9/2--likely stress demargination  - continue abx as above  - repeat WBC in AM  -remains afebrile and hemodynamically stable  Acute blood loss anemia  -from recent surgery superimposed on iron deficiency and chronic disease.  - hgb improved post transfusion.  - Continue iron supplements.  - Transfused 2 units PRBC intraoperatively on 8/26.  - Transfused 1 unit PRBC 8/28  - Transfused 2 units PRBC 09/17/13  Acute hypoxic respiratory failure  -likely due to atelectasis, spirometry use post-operative,  -resolved.  HTN (hypertension),  -blood pressure low normal. Continue to hold irbesartan.  Fibromyalgia, continue prn dilaudid and celexa  GERD (gastroesophageal reflux disease), stable, continue nexium  Depression/Anxiety, stable, continue wellbutrin, celexa, and buspar, and continue increased frequency of prn ativan. Needs close follow up with primary psychiatrist  Pruritis with history of hives being managed by dermatologist, improved. Continue loratadine, prn benadryl, and doxepin  Hyponatremia,  -may be due to dehydration and fluid shifts post large surgery, mild and asymptomatic.  -improved with IVF  Thrombocytosis,  -reactive from femur fracture  -resolved  Family Communication: daughter updated at beside  Disposition Plan: [facility] when stable  Consultants:  Ortho. Dr. Wynelle Link Procedures:  Left femoral revision on 09/08/13  Left femoral I&D/washout--09/15/13 Antibiotics:  Perioperative Vancomycin  Vancomycin 8/30 >> 09/18/13 Aztreonam 8/30 >>09/18/13 ertapenem 09/18/13>>>          Procedures/Studies: Dg Hip Complete Left  09/06/2013   CLINICAL DATA:  Left leg pain  EXAM: LEFT HIP - COMPLETE 2+ VIEW  COMPARISON:  None.  FINDINGS: There is a left total hip arthroplasty. There is superior dislocation of the left femoral head component relative  to the acetabular component. There is no acute fracture.  There is generalized osteopenia.  IMPRESSION: Left total hip arthroplasty with superior dislocation of the left femoral head component relative to the acetabulum.   Electronically Signed   By: Kathreen Devoid   On: 09/06/2013 18:25   Dg Femur Left  09/06/2013   CLINICAL DATA:  Increasing LEFT leg pain on Thursday, post hip replacement in July, hip injury  EXAM: LEFT FEMUR - 2 VIEW  COMPARISON:  Preoperative exam 07/12/2013  FINDINGS: Osseous demineralization.  LEFT hip prosthesis identified with superior dislocation of the femoral component.  Cerclage wires and lateral plate at the proximal LEFT femur appear intact.  No periprosthetic lucency or fracture identified.  Visualized pelvis intact.  Knee joint spaces preserved.  IMPRESSION: Dislocated LEFT hip prosthesis.  Osseous demineralization.   Electronically Signed   By: Lavonia Dana M.D.   On: 09/06/2013 18:24   Dg Pelvis Portable  09/08/2013   CLINICAL DATA:  Status post left hip surgery.  EXAM: PORTABLE PELVIS 1-2 VIEWS  COMPARISON:  08/21/2013.  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 A new left total hip prosthesis is demonstrated with interval discontinuity of the upper cerclage wire. No acute fracture or dislocation seen.  IMPRESSION: Interval replacement of the left hip prosthesis with discontinuity of the upper cerclage wire.   Electronically Signed   By: Enrique Sack M.D.   On: 09/08/2013 20:31   Dg Pelvis Portable  08/21/2013   CLINICAL DATA:  Postop ORIF left hip fracture  EXAM: PORTABLE PELVIS 1-2 VIEWS  COMPARISON:  07/28/2013  FINDINGS: Cerclage wire fixation of the left greater trochanter.  Left total hip arthroplasty.  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  Visualized bony pelvis appears intact.  IMPRESSION: Cerclage wire fixation of the left greater trochanter.  Left total  hip arthroplasty.   Electronically Signed   By: Julian Hy M.D.   On: 08/21/2013 14:32   Dg Chest Port 1 View  09/12/2013   CLINICAL DATA:  63 year old female with fever, cough and congestion.  EXAM: PORTABLE CHEST - 1 VIEW  COMPARISON:  09/10/2013 and prior radiographs dating back to 07/12/2013.  FINDINGS: The cardiomediastinal silhouette is unremarkable.  There is no evidence of focal airspace disease, pulmonary edema, suspicious pulmonary nodule/mass, pleural effusion, or pneumothorax. No acute bony abnormalities are identified.  IMPRESSION: No active disease.   Electronically Signed   By: Hassan Rowan M.D.   On: 09/12/2013 13:46   Dg Chest Port 1 View  09/10/2013   CLINICAL DATA:  Elevated white blood count, cough, shortness of breath  EXAM: PORTABLE CHEST - 1 VIEW  COMPARISON:  07/12/2013  FINDINGS: The heart size and mediastinal contours are within normal limits. Both lungs are clear. The visualized skeletal structures are unremarkable.  IMPRESSION: No active disease.   Electronically Signed   By: Skipper Cliche M.D.   On: 09/10/2013 07:41         Subjective: Patient denies fevers, chills, headache, chest pain, dyspnea, nausea, vomiting, diarrhea, abdominal pain, dysuria, hematuria   Objective: Filed Vitals:   09/17/13 2050 09/17/13 2154 09/18/13 0500 09/18/13 1354  BP: 122/60 118/58 118/69 118/64  Pulse: 85 87 91 85  Temp: 98.3 F (36.8 C) 98.7 F (37.1 C) 98.6 F (37 C) 98.2 F (36.8 C)  TempSrc: Oral Oral Oral Oral  Resp: 14 14 14  16  Height:      Weight:      SpO2: 99% 98% 99% 98%    Intake/Output Summary (Last 24 hours) at 09/18/13 1657 Last data filed at 09/18/13 0647  Gross per 24 hour  Intake 1034.67 ml  Output    800 ml  Net 234.67 ml   Weight change:  Exam:   General:  Pt is alert, follows commands appropriately, not in acute distress  HEENT: No icterus, No thrush, No neck mass, [place]/AT  Cardiovascular: RRR, S1/S2, no rubs, no  gallops  Respiratory: CTA bilaterally, no wheezing, no crackles, no rhonchi  Abdomen: Soft/+BS, non tender, non distended, no guarding  Extremities: Left hip without erythema. There is serosanguineous drainage. No crepitance or necrosis. Tenderness to palpation with induration.  Data Reviewed: Basic Metabolic Panel:  Recent Labs Lab 09/14/13 0520 09/15/13 0510 09/16/13 0533 09/17/13 0540 09/18/13 0519  NA 135* 139 140 143 142  K 3.7 3.9 4.3 3.9 3.5*  CL 99 102 104 106 105  CO2 26 26 26 27 27   GLUCOSE 105* 103* 151* 95 87  BUN 10 11 10 10 11   CREATININE 0.67 0.62 0.55 0.61 0.56  CALCIUM 8.5 8.5 8.4 8.4 8.4   Liver Function Tests: No results found for this basename: AST, ALT, ALKPHOS, BILITOT, PROT, ALBUMIN,  in the last 168 hours No results found for this basename: LIPASE, AMYLASE,  in the last 168 hours No results found for this basename: AMMONIA,  in the last 168 hours CBC:  Recent Labs Lab 09/13/13 0745 09/14/13 0520 09/15/13 0510 09/16/13 0533 09/17/13 0540 09/18/13 0519  WBC 12.4* 11.4* 11.1* 12.2* 11.0* 12.3*  NEUTROABS 8.0*  --   --   --   --   --   HGB 9.5* 9.2* 9.0* 7.7* 6.8* 9.8*  HCT 29.1* 27.5* 27.7* 23.7* 20.8* 29.3*  MCV 90.4 88.1 90.2 90.5 91.6 88.3  PLT 253 297 333 374 368 459*   Cardiac Enzymes: No results found for this basename: CKTOTAL, CKMB, CKMBINDEX, TROPONINI,  in the last 168 hours BNP: No components found with this basename: POCBNP,  CBG: No results found for this basename: GLUCAP,  in the last 168 hours  Recent Results (from the past 240 hour(s))  URINE CULTURE     Status: None   Collection Time    09/12/13  3:03 PM      Result Value Ref Range Status   Specimen Description URINE, CLEAN CATCH   Final   Special Requests NONE   Final   Culture  Setup Time     Final   Value: 09/13/2013 02:43     Performed at [facility]     Final   Value: 40,000 COLONIES/ML     Performed at [facility]   Culture      Final   Value: KLEBSIELLA PNEUMONIAE     Performed at [facility]   Report Status 09/15/2013 FINAL   Final   Organism ID, Bacteria KLEBSIELLA PNEUMONIAE   Final  CULTURE, BLOOD (ROUTINE X 2)     Status: None   Collection Time    09/12/13  3:44 PM      Result Value Ref Range Status   Specimen Description BLOOD LEFT ARM   Final   Special Requests     Final   Value: BOTTLES DRAWN AEROBIC AND ANAEROBIC 10CC BOTH BOTTLES   Culture  Setup Time     Final   Value: 09/13/2013 01:20  Performed at [facility]     Final   Value:        BLOOD CULTURE RECEIVED NO GROWTH TO DATE CULTURE WILL BE HELD FOR 5 DAYS BEFORE ISSUING A FINAL NEGATIVE REPORT     Performed at [facility]   Report Status PENDING   Incomplete  CULTURE, BLOOD (ROUTINE X 2)     Status: None   Collection Time    09/12/13  4:34 PM      Result Value Ref Range Status   Specimen Description BLOOD LEFT ARM   Final   Special Requests     Final   Value: BOTTLES DRAWN AEROBIC AND ANAEROBIC 10CC BOTH BOTTLES   Culture  Setup Time     Final   Value: 09/13/2013 01:20     Performed at [facility]   Culture     Final   Value:        BLOOD CULTURE RECEIVED NO GROWTH TO DATE CULTURE WILL BE HELD FOR 5 DAYS BEFORE ISSUING A FINAL NEGATIVE REPORT     Performed at [facility]   Report Status PENDING   Incomplete  ANAEROBIC CULTURE     Status: None   Collection Time    09/15/13  5:51 PM      Result Value Ref Range Status   Specimen Description ABSCESS LEFT HIP HEMATOMA   Final   Special Requests PATIENT ON FOLLOWING VANCOMYCIN   Final   Gram Stain     Final   Value: FEW WBC PRESENT,BOTH PMN AND MONONUCLEAR     NO SQUAMOUS EPITHELIAL CELLS SEEN     NO ORGANISMS SEEN     Performed at [facility]   Culture     Final   Value: NO ANAEROBES ISOLATED; CULTURE IN PROGRESS FOR 5 DAYS     Performed at [facility]   Report Status PENDING   Incomplete  CULTURE,  ROUTINE-ABSCESS     Status: None   Collection Time    09/15/13  5:51 PM      Result Value Ref Range Status   Specimen Description ABSCESS LEFT HIP HEMATOMA   Final   Special Requests PATIENT ON FOLLOWING VANCOMYCIN   Final   Gram Stain     Final   Value: FEW WBC PRESENT,BOTH PMN AND MONONUCLEAR     NO SQUAMOUS EPITHELIAL CELLS SEEN     NO ORGANISMS SEEN     Performed at [facility]   Culture     Final   Value: MULTIPLE ORGANISMS PRESENT, NONE PREDOMINANT     Note: NO STAPHYLOCOCCUS AUREUS ISOLATED NO GROUP A STREP (S.PYOGENES) ISOLATED     Performed at [facility]   Report Status 09/18/2013 FINAL   Final     Scheduled Meds: . aspirin EC  325 mg Oral Daily  . buPROPion  150 mg Oral BID  . busPIRone  10 mg Oral BID  . citalopram  10 mg Oral q morning - 10a  . cyclobenzaprine  10 mg Oral BID  . docusate sodium  100 mg Oral BID  . doxepin  10 mg Oral QHS  . ertapenem  1 g Intravenous Q24H  . esomeprazole  20 mg Oral BID  . iron polysaccharides  150 mg Oral BID  . lactose free nutrition  237 mL Oral TID WC  . loratadine  10 mg Oral Daily  . magic mouthwash  5 mL Oral QID  .  mupirocin ointment   Nasal BID  . oxybutynin  5 mg Oral Daily  . polyethylene glycol  17 g Oral Daily  . saccharomyces boulardii  250 mg Oral BID   Continuous Infusions: . sodium chloride 20 mL/hr (09/17/13 2300)  . lactated ringers       Louvenia Golomb, DO  [facility] Pager [phone]  If 7PM-7AM, please contact night-coverage [email or web address] Password [email or web address] 09/18/2013, 4:57 PM   LOS: 12 days

## 2013-09-18 NOTE — Progress Notes (Signed)
     Subjective: 3 Days Post-Op Procedure(s) (LRB): IRRIGATION AND DEBRIDEMENT LEFT HIP HEMATOMA (Left)   Patient reports pain as mild, pain controlled. She feels that her hip is continuing to get better, and she feels better from yesterday. No events throughout the night.  Objective:   VITALS:   Filed Vitals:   09/18/13 0500  BP: 118/69  Pulse: 91  Temp: 98.6 F (37 C)  Resp: 14    Dorsiflexion/Plantar flexion intact Incision: scant drainage No cellulitis present Compartment soft  LABS  Recent Labs  09/16/13 0533 09/17/13 0540 09/18/13 0519  HGB 7.7* 6.8* 9.8*  HCT 23.7* 20.8* 29.3*  WBC 12.2* 11.0* 12.3*  PLT 374 368 459*     Recent Labs  09/16/13 0533 09/17/13 0540 09/18/13 0519  NA 140 143 142  K 4.3 3.9 3.5*  BUN 10 10 11   CREATININE 0.55 0.61 0.56  GLUCOSE 151* 95 87     Assessment/Plan: 3 Days Post-Op Procedure(s) (LRB): IRRIGATION AND DEBRIDEMENT LEFT HIP HEMATOMA (Left)  On medical service Up with therapy Discharge to SNF eventually, when ready     [place]. Kayman Snuffer   PAC  09/18/2013, 8:40 AM

## 2013-09-18 NOTE — Progress Notes (Signed)
Physical Therapy Treatment Patient Details Name: [name] MRN: [id number] DOB: 10-28-1950 Today's Date: 09/18/2013    History of Present Illness LEFT FEMORAL REVISION -Left.. Pt with history of RAKA.    Pt did have I and D of L hip hematoma 9/2.      PT Comments    Assisted pt OOB via sliding board to wc.  Pt performed wc propel > 200 feet.  25% VC's on wc equipment and safety with locking brakes.  Pt requested to stay in wc at end of session.  Applied ICE to L hip.  Follow Up Recommendations        Equipment Recommendations       Recommendations for Other Services       Precautions / Restrictions Precautions Precautions: Fall Restrictions Weight Bearing Restrictions: No LLE Weight Bearing: Weight bearing as tolerated    Mobility  Bed Mobility Overal bed mobility: Needs Assistance Bed Mobility: Supine to Sit     Supine to sit: Min assist     General bed mobility comments: much better bed mobility with decreased c/o L hip pain.  Transfers Overall transfer level: Needs assistance Equipment used:  (sliding board) Transfers: Lateral/Scoot Transfers          Lateral/Scoot Transfers: Mod assist General transfer comment: used sliding board lateral scoot towards pt's L from bed to wheelchair.  Used bed pad under board to asssit.  Ambulation/Gait             General Gait Details: Unable to attempt this session.    Stairs            Information systems manager mobility: Yes Wheelchair propulsion: Both upper extremities Wheelchair parts: Needs assistance Distance: Engineer, manufacturing Details (indicate cue type and reason): <25% VC's on safety with turns and negociating around obsticles.  Modified Rankin (Stroke Patients Only)       Balance                                    Cognition                            Exercises      General Comments        Pertinent  Vitals/Pain Pain Assessment: 0-10 Pain Score: 5  Pain Location: L hip Pain Descriptors / Indicators: Sore;Constant;Discomfort;Tightness;Other (Comment) ("huge") Pain Intervention(s): Ice applied;Repositioned    Home Living                      Prior Function            PT Goals (current goals can now be found in the care plan section) Progress towards PT goals: Progressing toward goals    Frequency       PT Plan      Co-evaluation             End of Session           Time: 4193-7902 PT Time Calculation (min): 25 min  Charges:  $Therapeutic Activity: 8-22 mins $Wheel Chair Management: 8-22 mins                    G Codes:      Rica Koyanagi  PTA [facility]  Acute  Rehab Pager      [phone]

## 2013-09-19 DIAGNOSIS — Z96649 Presence of unspecified artificial hip joint: Secondary | ICD-10-CM

## 2013-09-19 DIAGNOSIS — Z888 Allergy status to other drugs, medicaments and biological substances status: Secondary | ICD-10-CM

## 2013-09-19 DIAGNOSIS — Z9660 Presence of unspecified orthopedic joint implant: Secondary | ICD-10-CM

## 2013-09-19 DIAGNOSIS — T84049A Periprosthetic fracture around unspecified internal prosthetic joint, initial encounter: Secondary | ICD-10-CM

## 2013-09-19 DIAGNOSIS — M869 Osteomyelitis, unspecified: Secondary | ICD-10-CM

## 2013-09-19 LAB — BASIC METABOLIC PANEL
ANION GAP: 9 (ref 5–15)
BUN: 10 mg/dL (ref 6–23)
CALCIUM: 8.4 mg/dL (ref 8.4–10.5)
CO2: 27 mEq/L (ref 19–32)
Chloride: 107 mEq/L (ref 96–112)
Creatinine, Ser: 0.56 mg/dL (ref 0.50–1.10)
GFR calc Af Amer: >90 mL/min (ref >90)
Glucose, Bld: 96 mg/dL (ref 70–99)
POTASSIUM: 3.9 meq/L (ref 3.7–5.3)
SODIUM: 143 meq/L (ref 137–147)

## 2013-09-19 LAB — C-REACTIVE PROTEIN: CRP: 4.5 mg/dL — ABNORMAL HIGH (ref <0.60)

## 2013-09-19 LAB — CULTURE, BLOOD (ROUTINE X 2)
Culture: NO GROWTH
Culture: NO GROWTH

## 2013-09-19 LAB — SEDIMENTATION RATE
SED RATE: 74 mm/h — AB (ref 0–22)
Sed Rate: 60 mm/hr — ABNORMAL HIGH (ref 0–22)

## 2013-09-19 LAB — CBC
HCT: 29.7 % — ABNORMAL LOW (ref 36.0–46.0)
Hemoglobin: 9.6 g/dL — ABNORMAL LOW (ref 12.0–15.0)
MCH: 29.2 pg (ref 26.0–34.0)
MCHC: 32.3 g/dL (ref 30.0–36.0)
MCV: 90.3 fL (ref 78.0–100.0)
PLATELETS: 457 10*3/uL — AB (ref 150–400)
RBC: 3.29 MIL/uL — ABNORMAL LOW (ref 3.87–5.11)
RDW: 14.7 % (ref 11.5–15.5)
WBC: 12.9 10*3/uL — AB (ref 4.0–10.5)

## 2013-09-19 NOTE — Consult Note (Addendum)
[facility]  Date of Admission:  09/06/2013  Date of Consult:  09/19/2013  Reason for Consult: multiple drug allergies, wound infection L THR Referring Physician: Tat  Impression/Recommendation Wound infection, L THR Osteomyelitis Multiple Drug allergies/intolerances UCx Kleb pneumo (8-30) R- amp  Would: Continue invanz Agee with stopping vanco Follow ESR and CRP Place pic  Comment Hopefully her hip can be salvaged. If it is infected, she is getting treated early and this greatly increases the odds of salvage.  Will see her in ID clinic in f/u.   Thank you so much for this interesting consult,   Bobby Rumpf (pager) [phone] [email or web address]  [name] is an 63 y.o. female.  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 She underwent repair of this 8-26 after dislocating her prosthesis.  48h later she was noted to have low grade temp (100)  and to have WBC 18.7. By 8-30 she had temp to 102.2. She was started on vanco/aztreonam. Her temp improved and her WBC decreased. She returned to the OR on 9-2 and underwent washout of her hip. She was felt to have a hematoma. Her Cx was poly-microbial (No staph, No strep).  She was changed to invanz/vanco on 9-5.   Past Medical History  Diagnosis Date  . Complication of anesthesia     NAUSEA  . Chest pain     pt states due to esophageal problem -  primary care MD aware   . Hives     since pneumonia vaccine   . Asthma   . Depression   . Anxiety   . Fibromyalgia   . Spondylosis   . Bulging disc   . 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 Phantom limb pain     rt leg  . Leg pain, left   . Eczema     ears  . GERD (gastroesophageal reflux disease)     " alot of burping"  . Constipation, chronic   . Urgency incontinence   . History of transfusion   . Cancer     hx sarcoma rt knee with amputation  . Difficulty sleeping   . Sleep apnea       pt had recent sleep study done but does not know results - report called for and put on chart showing significant obstructive sleep apnea  . Hypertension   . Back pain 08/19/13    low back and  left leg pain due to nerve problems per pt  . PONV (postoperative nausea and vomiting)   . Peripheral neuropathy   . History of bronchitis   . History of pneumonia   . Hiatal hernia   . History of scarlet fever   . Postmenopausal   . History of measles   . History of mumps   . History of rubella     Past Surgical History  Procedure Laterality Date  . Abdominal hysterectomy  1980'S  . Sarcoma  1973    RT KNEE (aka)  . External ear surgery  AGE 67  . 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 Orif periprosthetic fracture Left 08/21/2013    Procedure: OPEN REDUCTION INTERNAL FIXATION (ORIF) PERIPROSTHETIC FRACTURE WITH FEMORAL REVISION, LEFT FEMUR;  Surgeon:  [name], MD;  Location: [facility];  Service: Orthopedics;  Laterality: Left;  . Back surgery    . Femoral revision Left 09/08/2013    Procedure: LEFT FEMORAL REVISION;  Surgeon: [name], MD;  Location: [facility];  Service: Orthopedics;  Laterality: Left;  . Incision and drainage hip Left 09/15/2013    Procedure: IRRIGATION AND DEBRIDEMENT LEFT HIP HEMATOMA;  Surgeon: [name], MD;  Location: [facility];  Service: Orthopedics;  Laterality: Left;     Allergies  Allergen Reactions  . Bactrim [Sulfamethoxazole-Tmp Ds] Shortness Of Breath  . Cefuroxime Shortness Of Breath  . Omnipaque [Iohexol] Shortness Of Breath    Patient stated that she had a reaction a few years ago to the IV dye and had trouble breathing--so for her CT Angio Chest today (07/12/13), ER MD gave her solumedrol and Benadryl before she had test and did very well with that.  ak 07/12/13  . Pneumococcal Vaccines Hives     Hives over entire body after vaccine  . Penicillins Diarrhea  . Sulfa Antibiotics Other (See Comments)    unknown    Medications:  Scheduled: . aspirin EC  325 mg Oral Daily  . buPROPion  150 mg Oral BID  . busPIRone  10 mg Oral BID  . citalopram  10 mg Oral q morning - 10a  . cyclobenzaprine  10 mg Oral BID  . docusate sodium  100 mg Oral BID  . doxepin  10 mg Oral QHS  . ertapenem  1 g Intravenous Q24H  . esomeprazole  20 mg Oral BID  . iron polysaccharides  150 mg Oral BID  . lactose free nutrition  237 mL Oral TID WC  . loratadine  10 mg Oral Daily  . magic mouthwash  5 mL Oral QID  . oxybutynin  5 mg Oral Daily  . polyethylene glycol  17 g Oral Daily  . saccharomyces boulardii  250 mg Oral BID    Abtx:  Anti-infectives   Start     Dose/Rate Route Frequency Ordered Stop   09/18/13 1800  ertapenem (INVANZ) 1 g in sodium chloride 0.9 % 50 mL IVPB     1 g 100 mL/hr over 30 Minutes Intravenous Every 24 hours 09/18/13 1643     09/16/13 0500  vancomycin (VANCOCIN) 1,250 mg in sodium chloride 0.9 % 250 mL IVPB  Status:  Discontinued     1,250 mg 166.7 mL/hr over 90 Minutes Intravenous Every 12 hours 09/15/13 1716 09/18/13 1622   09/13/13 0500  vancomycin (VANCOCIN) IVPB 750 mg/150 ml premix  Status:  Discontinued     750 mg 150 mL/hr over 60 Minutes Intravenous Every 12 hours 09/12/13 1649 09/15/13 1715   09/12/13 1700  vancomycin (VANCOCIN) 1,500 mg in sodium chloride 0.9 % 500 mL IVPB     1,500 mg 250 mL/hr over 120 Minutes Intravenous  Once 09/12/13 1649 09/12/13 2000   09/12/13 1645  vancomycin (VANCOCIN) IVPB 750 mg/150 ml premix  Status:  Discontinued     750 mg 150 mL/hr over 60 Minutes Intravenous Every 12 hours 09/12/13 1636 09/12/13 1647   09/12/13 1645  aztreonam (AZACTAM) 2 g in dextrose 5 % 50 mL IVPB  Status:  Discontinued     2 g 100 mL/hr over 30 Minutes Intravenous 3 times per day 09/12/13 1636 09/18/13 1622   09/09/13 0600  vancomycin (VANCOCIN) IVPB  1000 mg/200 mL premix     1,000 mg 200 mL/hr over 60 Minutes Intravenous  Every 12 hours 09/08/13 2138 09/09/13 0725   09/08/13 0800  vancomycin (VANCOCIN) IVPB 1000 mg/200 mL premix     1,000 mg 200 mL/hr over 60 Minutes Intravenous  Once 09/07/13 0816 09/08/13 1805   09/08/13 0709  vancomycin (VANCOCIN) IVPB 1000 mg/200 mL premix  Status:  Discontinued     1,000 mg 200 mL/hr over 60 Minutes Intravenous On call to O.R. 09/08/13 0709 09/08/13 2137      Total days of antibiotics: 8 (vanco/aztreonam--> invanz/vanco)          Social History:  reports that she quit smoking about 6 years ago. Her smoking use included Cigarettes. She smoked 0.00 packs per day. She has never used smokeless tobacco. She reports that she does not drink alcohol or use illicit drugs.  Family History  Problem Relation Age of Onset  . CAD Sister     CABG, stents  . CAD Father     General ROS: eating well, no dysuria, normal BM, no yeast infections. see HPI.   Blood pressure 116/61, pulse 81, temperature 98.4 F (36.9 C), temperature source Oral, resp. rate 16, height '5\' 2"'  (1.575 m), weight 66.679 kg (147 lb), SpO2 96.00%. General appearance: alert, cooperative and no distress Eyes: negative findings: conjunctivae and sclerae normal and pupils equal, round, reactive to light and accomodation Throat: normal findings: oropharynx pink & moist without lesions or evidence of thrush Neck: no adenopathy and supple, symmetrical, trachea midline Lungs: clear to auscultation bilaterally Heart: regular rate and rhythm Abdomen: normal findings: bowel sounds normal and soft, non-tender Extremities: edema none and RLE AKA. L hip wound is clean, dressed.    Results for orders placed during the hospital encounter of 09/06/13 (from the past 48 hour(s))  BASIC METABOLIC PANEL     Status: Abnormal   Collection Time    09/18/13  5:19 AM      Result Value Ref Range   Sodium 142  137 - 147 mEq/L   Potassium 3.5 (*) 3.7 - 5.3  mEq/L   Chloride 105  96 - 112 mEq/L   CO2 27  19 - 32 mEq/L   Glucose, Bld 87  70 - 99 mg/dL   BUN 11  6 - 23 mg/dL   Creatinine, Ser 0.56  0.50 - 1.10 mg/dL   Calcium 8.4  8.4 - 10.5 mg/dL   GFR calc non Af Amer >90  >90 mL/min   GFR calc Af Amer >90  >90 mL/min   Comment: (NOTE)     The eGFR has been calculated using the CKD EPI equation.     This calculation has not been validated in all clinical situations.     eGFR's persistently <90 mL/min signify possible Chronic Kidney     Disease.   Anion gap 10  5 - 15  CBC     Status: Abnormal   Collection Time    09/18/13  5:19 AM      Result Value Ref Range   WBC 12.3 (*) 4.0 - 10.5 K/uL   RBC 3.32 (*) 3.87 - 5.11 MIL/uL   Hemoglobin 9.8 (*) 12.0 - 15.0 g/dL   Comment: DELTA CHECK NOTED     REPEATED TO VERIFY   HCT 29.3 (*) 36.0 - 46.0 %   MCV 88.3  78.0 - 100.0 fL   MCH 29.5  26.0 - 34.0 pg   MCHC 33.4  30.0 - 36.0 g/dL   RDW 14.8  11.5 - 15.5 %   Platelets 459 (*)  150 - 400 K/uL  BASIC METABOLIC PANEL     Status: None   Collection Time    09/19/13  5:47 AM      Result Value Ref Range   Sodium 143  137 - 147 mEq/L   Potassium 3.9  3.7 - 5.3 mEq/L   Chloride 107  96 - 112 mEq/L   CO2 27  19 - 32 mEq/L   Glucose, Bld 96  70 - 99 mg/dL   BUN 10  6 - 23 mg/dL   Creatinine, Ser 0.56  0.50 - 1.10 mg/dL   Calcium 8.4  8.4 - 10.5 mg/dL   GFR calc non Af Amer >90  >90 mL/min   GFR calc Af Amer >90  >90 mL/min   Comment: (NOTE)     The eGFR has been calculated using the CKD EPI equation.     This calculation has not been validated in all clinical situations.     eGFR's persistently <90 mL/min signify possible Chronic Kidney     Disease.   Anion gap 9  5 - 15  CBC     Status: Abnormal   Collection Time    09/19/13  5:47 AM      Result Value Ref Range   WBC 12.9 (*) 4.0 - 10.5 K/uL   RBC 3.29 (*) 3.87 - 5.11 MIL/uL   Hemoglobin 9.6 (*) 12.0 - 15.0 g/dL   HCT 29.7 (*) 36.0 - 46.0 %   MCV 90.3  78.0 - 100.0 fL   MCH 29.2   26.0 - 34.0 pg   MCHC 32.3  30.0 - 36.0 g/dL   RDW 14.7  11.5 - 15.5 %   Platelets 457 (*) 150 - 400 K/uL  SEDIMENTATION RATE     Status: Abnormal   Collection Time    09/19/13  5:47 AM      Result Value Ref Range   Sed Rate 60 (*) 0 - 22 mm/hr      Component Value Date/Time   SDES ABSCESS LEFT HIP HEMATOMA 09/15/2013 1751   SDES ABSCESS LEFT HIP HEMATOMA 09/15/2013 1751   SPECREQUEST PATIENT ON FOLLOWING VANCOMYCIN 09/15/2013 1751   SPECREQUEST PATIENT ON FOLLOWING VANCOMYCIN 09/15/2013 1751   CULT  Value: NO ANAEROBES ISOLATED; CULTURE IN PROGRESS FOR 5 DAYS Performed at [facility] 09/15/2013 1751   CULT  Value: MULTIPLE ORGANISMS PRESENT, NONE PREDOMINANT Note: NO STAPHYLOCOCCUS AUREUS ISOLATED NO GROUP A STREP (S.PYOGENES) ISOLATED Performed at [facility] 09/15/2013 1751   REPTSTATUS PENDING 09/15/2013 1751   REPTSTATUS 09/18/2013 FINAL 09/15/2013 1751   No results found. Recent Results (from the past 240 hour(s))  URINE CULTURE     Status: None   Collection Time    09/12/13  3:03 PM      Result Value Ref Range Status   Specimen Description URINE, CLEAN CATCH   Final   Special Requests NONE   Final   Culture  Setup Time     Final   Value: 09/13/2013 02:43     Performed at [facility]     Final   Value: 40,000 COLONIES/ML     Performed at [facility]   Culture     Final   Value: KLEBSIELLA PNEUMONIAE     Performed at [facility]   Report Status 09/15/2013 FINAL   Final   Organism ID, Bacteria KLEBSIELLA PNEUMONIAE   Final  CULTURE, BLOOD (ROUTINE X 2)  Status: None   Collection Time    09/12/13  3:44 PM      Result Value Ref Range Status   Specimen Description BLOOD LEFT ARM   Final   Special Requests     Final   Value: BOTTLES DRAWN AEROBIC AND ANAEROBIC 10CC BOTH BOTTLES   Culture  Setup Time     Final   Value: 09/13/2013 01:20     Performed at [facility]   Culture     Final   Value: NO GROWTH 5 DAYS      Performed at [facility]   Report Status 09/19/2013 FINAL   Final  CULTURE, BLOOD (ROUTINE X 2)     Status: None   Collection Time    09/12/13  4:34 PM      Result Value Ref Range Status   Specimen Description BLOOD LEFT ARM   Final   Special Requests     Final   Value: BOTTLES DRAWN AEROBIC AND ANAEROBIC 10CC BOTH BOTTLES   Culture  Setup Time     Final   Value: 09/13/2013 01:20     Performed at [facility]   Culture     Final   Value: NO GROWTH 5 DAYS     Performed at [facility]   Report Status 09/19/2013 FINAL   Final  ANAEROBIC CULTURE     Status: None   Collection Time    09/15/13  5:51 PM      Result Value Ref Range Status   Specimen Description ABSCESS LEFT HIP HEMATOMA   Final   Special Requests PATIENT ON FOLLOWING VANCOMYCIN   Final   Gram Stain     Final   Value: FEW WBC PRESENT,BOTH PMN AND MONONUCLEAR     NO SQUAMOUS EPITHELIAL CELLS SEEN     NO ORGANISMS SEEN     Performed at [facility]   Culture     Final   Value: NO ANAEROBES ISOLATED; CULTURE IN PROGRESS FOR 5 DAYS     Performed at [facility]   Report Status PENDING   Incomplete  CULTURE, ROUTINE-ABSCESS     Status: None   Collection Time    09/15/13  5:51 PM      Result Value Ref Range Status   Specimen Description ABSCESS LEFT HIP HEMATOMA   Final   Special Requests PATIENT ON FOLLOWING VANCOMYCIN   Final   Gram Stain     Final   Value: FEW WBC PRESENT,BOTH PMN AND MONONUCLEAR     NO SQUAMOUS EPITHELIAL CELLS SEEN     NO ORGANISMS SEEN     Performed at [facility]   Culture     Final   Value: MULTIPLE ORGANISMS PRESENT, NONE PREDOMINANT     Note: NO STAPHYLOCOCCUS AUREUS ISOLATED NO GROUP A STREP (S.PYOGENES) ISOLATED     Performed at [facility]   Report Status 09/18/2013 FINAL   Final      09/19/2013, 4:40 PM     LOS: 13 days

## 2013-09-19 NOTE — Progress Notes (Signed)
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 The patient initially had a left total hip arthroplasty on 07/28/2013. She was in rehabilitation 4 days prior to admission when she experienced a muscle pull-like feeling while getting on the bedpan. She had persistent pain and was found to have a dislocation of her prosthesis. She underwent repair on 8/26, however, her post-operative course has been complicated by SIRS and possible wound infection. 2 days after her revision, the patient developed leukocytosis and fever. She was started on vancomycin and aztreonam on 09/12/13, but she has had intermittent doses of vancomycin prior to that date. After antibiotics were started, the patient defervesced and the baby seems improved. The patient ultimately underwent evacuation of her hematoma on 09/15/2013. Intraoperative cultures on 09/15/2013 were polymicrobial without predominant organism or Staphylococcus aureus which raised concern about a possible prosthetic hip infection. As a result, infectious disease was consulted.  Assessment/Plan:  Left Hip Hematoma  -09/15/13 evacuation  -intraoperative culture=polymicrobial without S. aureus  -remain concerned about possible infected prosthesis  -consulted ID--discussed with Dr. Milana Na ertapenem for now  SIRS  -secondary to possible wound infection  -09/15/2013 L-hip I&D-->hematoma-->await cutlures  - Repeat CXR and UA negative  - 8/30 Blood cultures NGTD  - Continue vancomycin and aztreonam  - hemovac "fell out" evening of 09/15/13  Left leg hip prosthesis dislocation  -original L-THA on 07/28/13  - s/p left hip prosthesis revision on 8/26 by Dr. Wynelle Link  - DVT proph with  xarelto--restart when okay with ortho  - Weight bearing status per orthopedics  Leukocytosis,  -trending down, slightly up from surgery 9/2--likely stress demargination  - continue abx as above  - repeat WBC in AM  -remains afebrile and hemodynamically stable  Acute blood loss anemia  -from recent surgery superimposed on iron deficiency and chronic disease.  - hgb improved post transfusion.  - Continue iron supplements.  - Transfused 2 units PRBC intraoperatively on 8/26.  - Transfused 1 unit PRBC 8/28  - Transfused 2 units PRBC 09/17/13  Acute hypoxic respiratory failure  -likely due to atelectasis, spirometry use post-operative,  -resolved.  HTN (hypertension),  -blood pressure low normal. Continue to hold irbesartan.  Fibromyalgia, continue prn dilaudid and celexa  GERD (gastroesophageal reflux disease), stable, continue nexium  Depression/Anxiety, stable, continue wellbutrin, celexa, and buspar, and continue increased frequency of prn ativan. Needs close follow up with primary psychiatrist  Pruritis with history of hives being managed by dermatologist, improved. Continue loratadine, prn benadryl, and doxepin  Hyponatremia,  -may be due to dehydration and fluid shifts post large surgery, mild and asymptomatic.  -improved with IVF   Family Communication: daughter updated at beside  Disposition Plan: [facility] when stable  Consultants:  Ortho. Dr. Wynelle Link Procedures:  Left femoral revision on 09/08/13  Left femoral I&D/washout--09/15/13 Antibiotics:  Perioperative Vancomycin  Vancomycin 8/30 >> 09/18/13  Aztreonam 8/30 >>09/18/13  ertapenem 09/18/13>>>      Procedures/Studies: Dg Hip Complete Left  09/06/2013   CLINICAL DATA:  Left leg pain  EXAM: LEFT HIP - COMPLETE 2+ VIEW  COMPARISON:  None.  FINDINGS: There is a left total hip arthroplasty. There is superior dislocation of the left femoral head component relative to the acetabular component. There is  no acute  fracture.  There is generalized osteopenia.  IMPRESSION: Left total hip arthroplasty with superior dislocation of the left femoral head component relative to the acetabulum.   Electronically Signed   By: Kathreen Devoid   On: 09/06/2013 18:25   Dg Femur Left  09/06/2013   CLINICAL DATA:  Increasing LEFT leg pain on Thursday, post hip replacement in July, hip injury  EXAM: LEFT FEMUR - 2 VIEW  COMPARISON:  Preoperative exam 07/12/2013  FINDINGS: Osseous demineralization.  LEFT hip prosthesis identified with superior dislocation of the femoral component.  Cerclage wires and lateral plate at the proximal LEFT femur appear intact.  No periprosthetic lucency or fracture identified.  Visualized pelvis intact.  Knee joint spaces preserved.  IMPRESSION: Dislocated LEFT hip prosthesis.  Osseous demineralization.   Electronically Signed   By: Lavonia Dana M.D.   On: 09/06/2013 18:24   Dg Pelvis Portable  09/08/2013   CLINICAL DATA:  Status post left hip surgery.  EXAM: PORTABLE PELVIS 1-2 VIEWS  COMPARISON:  08/21/2013.  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 A new left total hip prosthesis is demonstrated with interval discontinuity of the upper cerclage wire. No acute fracture or dislocation seen.  IMPRESSION: Interval replacement of the left hip prosthesis with discontinuity of the upper cerclage wire.   Electronically Signed   By: Enrique Sack M.D.   On: 09/08/2013 20:31   Dg Pelvis Portable  08/21/2013   CLINICAL DATA:  Postop ORIF left hip fracture  EXAM: PORTABLE PELVIS 1-2 VIEWS  COMPARISON:  07/28/2013  FINDINGS: Cerclage wire fixation of the left greater trochanter.  Left total hip arthroplasty.  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  Visualized bony pelvis appears intact.  IMPRESSION: Cerclage wire fixation of the left greater trochanter.  Left total hip arthroplasty.   Electronically Signed   By:  Julian Hy M.D.   On: 08/21/2013 14:32   Dg Chest Port 1 View  09/12/2013   CLINICAL DATA:  63 year old female with fever, cough and congestion.  EXAM: PORTABLE CHEST - 1 VIEW  COMPARISON:  09/10/2013 and prior radiographs dating back to 07/12/2013.  FINDINGS: The cardiomediastinal silhouette is unremarkable.  There is no evidence of focal airspace disease, pulmonary edema, suspicious pulmonary nodule/mass, pleural effusion, or pneumothorax. No acute bony abnormalities are identified.  IMPRESSION: No active disease.   Electronically Signed   By: Hassan Rowan M.D.   On: 09/12/2013 13:46   Dg Chest Port 1 View  09/10/2013   CLINICAL DATA:  Elevated white blood count, cough, shortness of breath  EXAM: PORTABLE CHEST - 1 VIEW  COMPARISON:  07/12/2013  FINDINGS: The heart size and mediastinal contours are within normal limits. Both lungs are clear. The visualized skeletal structures are unremarkable.  IMPRESSION: No active disease.   Electronically Signed   By: Skipper Cliche M.D.   On: 09/10/2013 07:41         Subjective: Patient denies fevers, chills, headache, chest pain, dyspnea, nausea, vomiting, diarrhea, abdominal pain, dysuria, hematuria   Objective: Filed Vitals:   09/18/13 1354 09/18/13 2100 09/19/13 0610 09/19/13 1406  BP: 118/64 102/51 128/61 116/61  Pulse: 85 92 86 81  Temp: 98.2 F (36.8 C) 98 F (36.7 C) 97.7 F (36.5 C) 98.4 F (36.9 C)  TempSrc: Oral Axillary Oral Oral  Resp: 16  16 16   Height:  Weight:      SpO2: 98% 98% 97% 96%    Intake/Output Summary (Last 24 hours) at 09/19/13 1508 Last data filed at 09/19/13 1035  Gross per 24 hour  Intake    720 ml  Output    851 ml  Net   -131 ml   Weight change:  Exam:   General:  Pt is alert, follows commands appropriately, not in acute distress  HEENT: No icterus, No thrush,  [place]/AT  Cardiovascular: RRR, S1/S2, no rubs, no gallops  Respiratory: CTA bilaterally, no wheezing, no crackles, no  rhonchi  Abdomen: Soft/+BS, non tender, non distended, no guarding  Extremities: L- left hip with no erythema, lymphangitis, crepitance. Scant amount of serous drainage.  Data Reviewed: Basic Metabolic Panel:  Recent Labs Lab 09/15/13 0510 09/16/13 0533 09/17/13 0540 09/18/13 0519 09/19/13 0547  NA 139 140 143 142 143  K 3.9 4.3 3.9 3.5* 3.9  CL 102 104 106 105 107  CO2 26 26 27 27 27   GLUCOSE 103* 151* 95 87 96  BUN 11 10 10 11 10   CREATININE 0.62 0.55 0.61 0.56 0.56  CALCIUM 8.5 8.4 8.4 8.4 8.4   Liver Function Tests: No results found for this basename: AST, ALT, ALKPHOS, BILITOT, PROT, ALBUMIN,  in the last 168 hours No results found for this basename: LIPASE, AMYLASE,  in the last 168 hours No results found for this basename: AMMONIA,  in the last 168 hours CBC:  Recent Labs Lab 09/13/13 0745  09/15/13 0510 09/16/13 0533 09/17/13 0540 09/18/13 0519 09/19/13 0547  WBC 12.4*  < > 11.1* 12.2* 11.0* 12.3* 12.9*  NEUTROABS 8.0*  --   --   --   --   --   --   HGB 9.5*  < > 9.0* 7.7* 6.8* 9.8* 9.6*  HCT 29.1*  < > 27.7* 23.7* 20.8* 29.3* 29.7*  MCV 90.4  < > 90.2 90.5 91.6 88.3 90.3  PLT 253  < > 333 374 368 459* 457*  < > = values in this interval not displayed. Cardiac Enzymes: No results found for this basename: CKTOTAL, CKMB, CKMBINDEX, TROPONINI,  in the last 168 hours BNP: No components found with this basename: POCBNP,  CBG: No results found for this basename: GLUCAP,  in the last 168 hours  Recent Results (from the past 240 hour(s))  URINE CULTURE     Status: None   Collection Time    09/12/13  3:03 PM      Result Value Ref Range Status   Specimen Description URINE, CLEAN CATCH   Final   Special Requests NONE   Final   Culture  Setup Time     Final   Value: 09/13/2013 02:43     Performed at [place]     Final   Value: 40,000 COLONIES/ML     Performed at [facility]   Culture     Final   Value: KLEBSIELLA  PNEUMONIAE     Performed at [facility]   Report Status 09/15/2013 FINAL   Final   Organism ID, Bacteria KLEBSIELLA PNEUMONIAE   Final  CULTURE, BLOOD (ROUTINE X 2)     Status: None   Collection Time    09/12/13  3:44 PM      Result Value Ref Range Status   Specimen Description BLOOD LEFT ARM   Final   Special Requests     Final   Value: BOTTLES DRAWN AEROBIC AND  ANAEROBIC 10CC BOTH BOTTLES   Culture  Setup Time     Final   Value: 09/13/2013 01:20     Performed at [facility]   Culture     Final   Value: NO GROWTH 5 DAYS     Performed at [facility]   Report Status 09/19/2013 FINAL   Final  CULTURE, BLOOD (ROUTINE X 2)     Status: None   Collection Time    09/12/13  4:34 PM      Result Value Ref Range Status   Specimen Description BLOOD LEFT ARM   Final   Special Requests     Final   Value: BOTTLES DRAWN AEROBIC AND ANAEROBIC 10CC BOTH BOTTLES   Culture  Setup Time     Final   Value: 09/13/2013 01:20     Performed at [facility]   Culture     Final   Value: NO GROWTH 5 DAYS     Performed at [facility]   Report Status 09/19/2013 FINAL   Final  ANAEROBIC CULTURE     Status: None   Collection Time    09/15/13  5:51 PM      Result Value Ref Range Status   Specimen Description ABSCESS LEFT HIP HEMATOMA   Final   Special Requests PATIENT ON FOLLOWING VANCOMYCIN   Final   Gram Stain     Final   Value: FEW WBC PRESENT,BOTH PMN AND MONONUCLEAR     NO SQUAMOUS EPITHELIAL CELLS SEEN     NO ORGANISMS SEEN     Performed at [facility]   Culture     Final   Value: NO ANAEROBES ISOLATED; CULTURE IN PROGRESS FOR 5 DAYS     Performed at [facility]   Report Status PENDING   Incomplete  CULTURE, ROUTINE-ABSCESS     Status: None   Collection Time    09/15/13  5:51 PM      Result Value Ref Range Status   Specimen Description ABSCESS LEFT HIP HEMATOMA   Final   Special Requests PATIENT ON FOLLOWING VANCOMYCIN   Final    Gram Stain     Final   Value: FEW WBC PRESENT,BOTH PMN AND MONONUCLEAR     NO SQUAMOUS EPITHELIAL CELLS SEEN     NO ORGANISMS SEEN     Performed at [facility]   Culture     Final   Value: MULTIPLE ORGANISMS PRESENT, NONE PREDOMINANT     Note: NO STAPHYLOCOCCUS AUREUS ISOLATED NO GROUP A STREP (S.PYOGENES) ISOLATED     Performed at [facility]   Report Status 09/18/2013 FINAL   Final     Scheduled Meds: . aspirin EC  325 mg Oral Daily  . buPROPion  150 mg Oral BID  . busPIRone  10 mg Oral BID  . citalopram  10 mg Oral q morning - 10a  . cyclobenzaprine  10 mg Oral BID  . docusate sodium  100 mg Oral BID  . doxepin  10 mg Oral QHS  . ertapenem  1 g Intravenous Q24H  . esomeprazole  20 mg Oral BID  . iron polysaccharides  150 mg Oral BID  . lactose free nutrition  237 mL Oral TID WC  . loratadine  10 mg Oral Daily  . magic mouthwash  5 mL Oral QID  . oxybutynin  5 mg Oral Daily  . polyethylene glycol  17 g Oral Daily  . saccharomyces boulardii  250 mg Oral BID   Continuous Infusions: . sodium chloride 20 mL/hr (09/17/13 2300)  . lactated ringers       Devika Dragovich, DO  [facility] Pager [phone]  If 7PM-7AM, please contact night-coverage [email or web address] Password [email or web address] 09/19/2013, 3:08 PM   LOS: 13 days

## 2013-09-20 DIAGNOSIS — T8149XA Infection following a procedure, other surgical site, initial encounter: Secondary | ICD-10-CM

## 2013-09-20 DIAGNOSIS — T8140XA Infection following a procedure, unspecified, initial encounter: Secondary | ICD-10-CM

## 2013-09-20 LAB — BASIC METABOLIC PANEL
ANION GAP: 8 (ref 5–15)
BUN: 13 mg/dL (ref 6–23)
CO2: 28 mEq/L (ref 19–32)
Calcium: 8.3 mg/dL — ABNORMAL LOW (ref 8.4–10.5)
Chloride: 106 mEq/L (ref 96–112)
Creatinine, Ser: 0.59 mg/dL (ref 0.50–1.10)
GFR calc Af Amer: >90 mL/min (ref >90)
Glucose, Bld: 101 mg/dL — ABNORMAL HIGH (ref 70–99)
POTASSIUM: 4.2 meq/L (ref 3.7–5.3)
SODIUM: 142 meq/L (ref 137–147)

## 2013-09-20 LAB — ANAEROBIC CULTURE

## 2013-09-20 LAB — CBC
HCT: 29.3 % — ABNORMAL LOW (ref 36.0–46.0)
Hemoglobin: 9.4 g/dL — ABNORMAL LOW (ref 12.0–15.0)
MCH: 29.6 pg (ref 26.0–34.0)
MCHC: 32.1 g/dL (ref 30.0–36.0)
MCV: 92.1 fL (ref 78.0–100.0)
PLATELETS: 458 10*3/uL — AB (ref 150–400)
RBC: 3.18 MIL/uL — AB (ref 3.87–5.11)
RDW: 14.9 % (ref 11.5–15.5)
WBC: 11.7 10*3/uL — AB (ref 4.0–10.5)

## 2013-09-20 MED ORDER — SODIUM CHLORIDE 0.9 % IJ SOLN
10.0000 mL | INTRAMUSCULAR | Status: DC | PRN
  Administered 2013-09-21 (×2): 10 mL

## 2013-09-20 NOTE — Progress Notes (Signed)
INFECTIOUS DISEASE PROGRESS NOTE  ID: [name] is a 63 y.o. female with  Active Problems:   Fibromyalgia   GERD (gastroesophageal reflux disease)   Postoperative anemia due to acute blood loss   Depression   Anxiety state, unspecified   Phantom limb (syndrome)   Obstructive sleep apnea   Hyponatremia   Transfusion history   Dislocation of hip joint prosthesis   HTN (hypertension)   Left hip pain   Femoral loosening of prosthetic left hip   SIRS (systemic inflammatory response syndrome)   Acute blood loss anemia   Postoperative wound infection of left hip  Subjective: No problems with anbx.   Abtx:  Anti-infectives   Start     Dose/Rate Route Frequency Ordered Stop   09/18/13 1800  ertapenem (INVANZ) 1 g in sodium chloride 0.9 % 50 mL IVPB     1 g 100 mL/hr over 30 Minutes Intravenous Every 24 hours 09/18/13 1643     09/16/13 0500  vancomycin (VANCOCIN) 1,250 mg in sodium chloride 0.9 % 250 mL IVPB  Status:  Discontinued     1,250 mg 166.7 mL/hr over 90 Minutes Intravenous Every 12 hours 09/15/13 1716 09/18/13 1622   09/13/13 0500  vancomycin (VANCOCIN) IVPB 750 mg/150 ml premix  Status:  Discontinued     750 mg 150 mL/hr over 60 Minutes Intravenous Every 12 hours 09/12/13 1649 09/15/13 1715   09/12/13 1700  vancomycin (VANCOCIN) 1,500 mg in sodium chloride 0.9 % 500 mL IVPB     1,500 mg 250 mL/hr over 120 Minutes Intravenous  Once 09/12/13 1649 09/12/13 2000   09/12/13 1645  vancomycin (VANCOCIN) IVPB 750 mg/150 ml premix  Status:  Discontinued     750 mg 150 mL/hr over 60 Minutes Intravenous Every 12 hours 09/12/13 1636 09/12/13 1647   09/12/13 1645  aztreonam (AZACTAM) 2 g in dextrose 5 % 50 mL IVPB  Status:  Discontinued     2 g 100 mL/hr over 30 Minutes Intravenous 3 times per day 09/12/13 1636 09/18/13 1622   09/09/13 0600  vancomycin (VANCOCIN) IVPB 1000 mg/200 mL premix     1,000 mg 200 mL/hr over 60 Minutes Intravenous Every 12 hours 09/08/13 2138  09/09/13 0725   09/08/13 0800  vancomycin (VANCOCIN) IVPB 1000 mg/200 mL premix     1,000 mg 200 mL/hr over 60 Minutes Intravenous  Once 09/07/13 0816 09/08/13 1805   09/08/13 0709  vancomycin (VANCOCIN) IVPB 1000 mg/200 mL premix  Status:  Discontinued     1,000 mg 200 mL/hr over 60 Minutes Intravenous On call to O.R. 09/08/13 0709 09/08/13 2137      Medications:  Scheduled: . aspirin EC  325 mg Oral Daily  . buPROPion  150 mg Oral BID  . busPIRone  10 mg Oral BID  . citalopram  10 mg Oral q morning - 10a  . cyclobenzaprine  10 mg Oral BID  . docusate sodium  100 mg Oral BID  . doxepin  10 mg Oral QHS  . ertapenem  1 g Intravenous Q24H  . esomeprazole  20 mg Oral BID  . iron polysaccharides  150 mg Oral BID  . lactose free nutrition  237 mL Oral TID WC  . loratadine  10 mg Oral Daily  . magic mouthwash  5 mL Oral QID  . oxybutynin  5 mg Oral Daily  . polyethylene glycol  17 g Oral Daily  . saccharomyces boulardii  250 mg Oral BID    Objective:  Vital signs in last 24 hours: Temp:  [97.9 F (36.6 C)-98.6 F (37 C)] 97.9 F (36.6 C) (09/07 0616) Pulse Rate:  [83-87] 87 (09/07 0616) Resp:  [14-16] 14 (09/07 0616) BP: (108-123)/(53-57) 108/57 mmHg (09/07 0616) SpO2:  [97 %-98 %] 97 % (09/07 0616)   General appearance: alert, cooperative and no distress Resp: clear to auscultation bilaterally Cardio: regular rate and rhythm GI: normal findings: bowel sounds normal and soft, non-tender Extremities: L hip wound is dressed. anasarca around dressing.   Lab Results  Recent Labs  09/19/13 0547 09/20/13 0525  WBC 12.9* 11.7*  HGB 9.6* 9.4*  HCT 29.7* 29.3*  NA 143 142  K 3.9 4.2  CL 107 106  CO2 27 28  BUN 10 13  CREATININE 0.56 0.59   Liver Panel No results found for this basename: PROT, ALBUMIN, AST, ALT, ALKPHOS, BILITOT, BILIDIR, IBILI,  in the last 72 hours Sedimentation Rate  Recent Labs  09/19/13 1746  ESRSEDRATE 74*   C-Reactive Protein  Recent  Labs  09/19/13 1746  CRP 4.5*    Microbiology: Recent Results (from the past 240 hour(s))  URINE CULTURE     Status: None   Collection Time    09/12/13  3:03 PM      Result Value Ref Range Status   Specimen Description URINE, CLEAN CATCH   Final   Special Requests NONE   Final   Culture  Setup Time     Final   Value: 09/13/2013 02:43     Performed at [place]     Final   Value: 40,000 COLONIES/ML     Performed at [facility]   Culture     Final   Value: KLEBSIELLA PNEUMONIAE     Performed at [facility]   Report Status 09/15/2013 FINAL   Final   Organism ID, Bacteria KLEBSIELLA PNEUMONIAE   Final  CULTURE, BLOOD (ROUTINE X 2)     Status: None   Collection Time    09/12/13  3:44 PM      Result Value Ref Range Status   Specimen Description BLOOD LEFT ARM   Final   Special Requests     Final   Value: BOTTLES DRAWN AEROBIC AND ANAEROBIC 10CC BOTH BOTTLES   Culture  Setup Time     Final   Value: 09/13/2013 01:20     Performed at [facility]   Culture     Final   Value: NO GROWTH 5 DAYS     Performed at [facility]   Report Status 09/19/2013 FINAL   Final  CULTURE, BLOOD (ROUTINE X 2)     Status: None   Collection Time    09/12/13  4:34 PM      Result Value Ref Range Status   Specimen Description BLOOD LEFT ARM   Final   Special Requests     Final   Value: BOTTLES DRAWN AEROBIC AND ANAEROBIC 10CC BOTH BOTTLES   Culture  Setup Time     Final   Value: 09/13/2013 01:20     Performed at [facility]   Culture     Final   Value: NO GROWTH 5 DAYS     Performed at [facility]   Report Status 09/19/2013 FINAL   Final  ANAEROBIC CULTURE     Status: None   Collection Time    09/15/13  5:51 PM      Result Value  Ref Range Status   Specimen Description ABSCESS LEFT HIP HEMATOMA   Final   Special Requests PATIENT ON FOLLOWING VANCOMYCIN   Final   Gram Stain     Final   Value: FEW WBC PRESENT,BOTH  PMN AND MONONUCLEAR     NO SQUAMOUS EPITHELIAL CELLS SEEN     NO ORGANISMS SEEN     Performed at [facility]   Culture     Final   Value: NO ANAEROBES ISOLATED     Performed at [facility]   Report Status 09/20/2013 FINAL   Final  CULTURE, ROUTINE-ABSCESS     Status: None   Collection Time    09/15/13  5:51 PM      Result Value Ref Range Status   Specimen Description ABSCESS LEFT HIP HEMATOMA   Final   Special Requests PATIENT ON FOLLOWING VANCOMYCIN   Final   Gram Stain     Final   Value: FEW WBC PRESENT,BOTH PMN AND MONONUCLEAR     NO SQUAMOUS EPITHELIAL CELLS SEEN     NO ORGANISMS SEEN     Performed at [facility]   Culture     Final   Value: MULTIPLE ORGANISMS PRESENT, NONE PREDOMINANT     Note: NO STAPHYLOCOCCUS AUREUS ISOLATED NO GROUP A STREP (S.PYOGENES) ISOLATED     Performed at [facility]   Report Status 09/18/2013 FINAL   Final    Studies/Results: No results found.   Assessment/Plan: Wound infection, L THR Osteomyelitis  Multiple Drug allergies/intolerances  UCx Kleb pneumo (8-30) R- amp  Total days of antibiotics: 9 (vanco/aztreonam--> invanz/vanco --> invanz)  She has PIC.  Hopefully home in AM Would plan for 6 weeks of invanz.  Will see her in f/u in ID clinic.          Bobby Rumpf Infectious Diseases (pager) [phone] [email or web address] 09/20/2013, 3:42 PM  LOS: 14 days

## 2013-09-20 NOTE — Progress Notes (Signed)
Subjective: 5 Days Post-Op Procedure(s) (LRB): IRRIGATION AND DEBRIDEMENT LEFT HIP HEMATOMA (Left) Patient reports pain as mild.   Patient seen in rounds with Dr. Wynelle Link.  Husband in room. Patient is well, but has had some minor complaints of pain in the hip, requiring pain medications Plan is to go Skilled nursing facility after hospital stay. PICC line today or tomorrow depending upon their schedule with it being a holiday.  Objective: Vital signs in last 24 hours: Temp:  [97.9 F (36.6 C)-98.6 F (37 C)] 97.9 F (36.6 C) (09/07 0616) Pulse Rate:  [81-87] 87 (09/07 0616) Resp:  [14-16] 14 (09/07 0616) BP: (108-123)/(53-61) 108/57 mmHg (09/07 0616) SpO2:  [96 %-98 %] 97 % (09/07 0616)  Intake/Output from previous day:  Intake/Output Summary (Last 24 hours) at 09/20/13 0935 Last data filed at 09/20/13 0617  Gross per 24 hour  Intake   1850 ml  Output      0 ml  Net   1850 ml    Intake/Output this shift:    Labs:  Recent Labs  09/18/13 0519 09/19/13 0547 09/20/13 0525  HGB 9.8* 9.6* 9.4*    Recent Labs  09/19/13 0547 09/20/13 0525  WBC 12.9* 11.7*  RBC 3.29* 3.18*  HCT 29.7* 29.3*  PLT 457* 458*    Recent Labs  09/19/13 0547 09/20/13 0525  NA 143 142  K 3.9 4.2  CL 107 106  CO2 27 28  BUN 10 13  CREATININE 0.56 0.59  GLUCOSE 96 101*  CALCIUM 8.4 8.3*   No results found for this basename: LABPT, INR,  in the last 72 hours  EXAM General - Patient is Alert, Appropriate and Oriented Extremity - Neurovascular intact Sensation intact distally Dressing/Incision - clean, dry, no drainage Motor Function - intact, moving foot and toes well on exam.   Past Medical History  Diagnosis Date  . Complication of anesthesia     NAUSEA  . Chest pain     pt states due to esophageal problem -  primary care MD aware   . Hives     since pneumonia vaccine   . Asthma   . Depression   . Anxiety   . Fibromyalgia   . Spondylosis   . Bulging disc   . 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 Phantom limb pain     rt leg  . Leg pain, left   . Eczema     ears  . GERD (gastroesophageal reflux disease)     " alot of burping"  . Constipation, chronic   . Urgency incontinence   . History of transfusion   . Cancer     hx sarcoma rt knee with amputation  . Difficulty sleeping   . Sleep apnea     pt had recent sleep study done but does not know results - report called for and put on chart showing significant obstructive sleep apnea  . Hypertension   . Back pain 08/19/13    low back and  left leg pain due to nerve problems per pt  . PONV (postoperative nausea and vomiting)   . Peripheral neuropathy   . History of bronchitis   . History of pneumonia   . Hiatal hernia   . History of scarlet fever   . Postmenopausal   . History of measles   . History of mumps   . History of rubella     Assessment/Plan: 5 Days  Post-Op Procedure(s) (LRB): IRRIGATION AND DEBRIDEMENT LEFT HIP HEMATOMA (Left) Active Problems:   Fibromyalgia   GERD (gastroesophageal reflux disease)   Postoperative anemia due to acute blood loss   Depression   Anxiety state, unspecified   Phantom limb (syndrome)   Obstructive sleep apnea   Hyponatremia   Transfusion history   Dislocation of hip joint prosthesis   HTN (hypertension)   Left hip pain   Femoral loosening of prosthetic left hip   SIRS (systemic inflammatory response syndrome)   Acute blood loss anemia  Estimated body mass index is 26.88 kg/(m^2) as calculated from the following:   Height as of this encounter: 5\' 2"  (1.575 m).   Weight as of this encounter: 66.679 kg (147 lb). Up with therapy Plan for discharge tomorrow if gets PICC today  DVT Prophylaxis - Aspirin Weight Bearing As Tolerated left Leg  Arlee Muslim, PA-C Orthopaedic Surgery 09/20/2013, 9:35 AM

## 2013-09-20 NOTE — Progress Notes (Signed)
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 The patient initially had a left total hip arthroplasty on 07/28/2013. She was in rehabilitation 4 days prior to admission when she experienced a muscle pull-like feeling while getting on the bedpan. She had persistent pain and was found to have a dislocation of her prosthesis. She underwent repair on 8/26, however, her post-operative course has been complicated by SIRS and possible wound infection. 2 days after her revision, the patient developed leukocytosis and fever. She was started on vancomycin and aztreonam on 09/12/13, but she has had intermittent doses of vancomycin prior to that date. After antibiotics were started, the patient defervesced and the baby seems improved. The patient ultimately underwent evacuation of her hematoma on 09/15/2013. Intraoperative cultures on 09/15/2013 were polymicrobial without predominant organism or Staphylococcus aureus which raised concern about a possible prosthetic hip infection. As a result, infectious disease was consulted.  Assessment/Plan:  Left Hip Hematoma  -09/15/13 evacuation  -intraoperative culture=polymicrobial without S. aureus  -remain concerned about possible infected prosthesis  -consulted ID--discussed with Dr. Milana Na ertapenem (09/18/13) -09/20/13--PICC line placed--to SNF with ertapenem SIRS  -secondary to wound infection  -09/15/2013 L-hip I&D-->hematoma-->polymicrobial - Repeat CXR and UA negative   - 8/30 Blood cultures NGTD  - Continue vancomycin and aztreonam  - hemovac "fell out" evening of 09/15/13  Left leg hip prosthesis dislocation  -original L-THA on 07/28/13  - s/p left hip prosthesis revision on 8/26 by Dr.  Wynelle Link  - DVT proph with xarelto--restart when okay with ortho  - Weight bearing status per orthopedics  Leukocytosis,  -trending down, slightly up from surgery 9/2--likely stress demargination  - continue abx as above  -remains afebrile and hemodynamically stable  Acute blood loss anemia  -from recent surgery superimposed on iron deficiency and chronic disease.  - hgb improved post transfusion.  - Continue iron supplements.  - Transfused 2 units PRBC intraoperatively on 8/26.  - Transfused 1 unit PRBC 8/28  - Transfused 2 units PRBC 09/17/13  Acute hypoxic respiratory failure  -likely due to atelectasis, spirometry use post-operative,  -resolved.  HTN (hypertension),  -blood pressure low normal. Continue to hold irbesartan.  Fibromyalgia, continue prn dilaudid and celexa  GERD (gastroesophageal reflux disease), stable, continue nexium  Depression/Anxiety, stable, continue wellbutrin, celexa, and buspar, and continue increased frequency of prn ativan. Needs close follow up with primary psychiatrist  Pruritis with history of hives being managed by dermatologist, improved. Continue loratadine, prn benadryl, and doxepin  Hyponatremia,  -may be due to dehydration and fluid shifts post large surgery, mild and asymptomatic.  -improved with IVF  Family Communication: husband updated at beside  Disposition Plan: [facility] 09/21/13 Consultants:  Ortho. Dr. Wynelle Link Procedures:  Left femoral revision on 09/08/13  Left femoral I&D/washout--09/15/13 Antibiotics:  Perioperative Vancomycin  Vancomycin 8/30 >> 09/18/13  Aztreonam 8/30 >>09/18/13  ertapenem 09/18/13>>>       Procedures/Studies: Dg Hip Complete Left  09/06/2013   CLINICAL DATA:  Left leg pain  EXAM: LEFT HIP - COMPLETE 2+ VIEW  COMPARISON:  None.  FINDINGS: There is a left total hip arthroplasty. There is superior dislocation of the left femoral head component relative to the acetabular component. There is no acute  fracture.  There  is generalized osteopenia.  IMPRESSION: Left total hip arthroplasty with superior dislocation of the left femoral head component relative to the acetabulum.   Electronically Signed   By: Kathreen Devoid   On: 09/06/2013 18:25   Dg Femur Left  09/06/2013   CLINICAL DATA:  Increasing LEFT leg pain on Thursday, post hip replacement in July, hip injury  EXAM: LEFT FEMUR - 2 VIEW  COMPARISON:  Preoperative exam 07/12/2013  FINDINGS: Osseous demineralization.  LEFT hip prosthesis identified with superior dislocation of the femoral component.  Cerclage wires and lateral plate at the proximal LEFT femur appear intact.  No periprosthetic lucency or fracture identified.  Visualized pelvis intact.  Knee joint spaces preserved.  IMPRESSION: Dislocated LEFT hip prosthesis.  Osseous demineralization.   Electronically Signed   By: Lavonia Dana M.D.   On: 09/06/2013 18:24   Dg Pelvis Portable  09/08/2013   CLINICAL DATA:  Status post left hip surgery.  EXAM: PORTABLE PELVIS 1-2 VIEWS  COMPARISON:  08/21/2013.  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 A new left total hip prosthesis is demonstrated with interval discontinuity of the upper cerclage wire. No acute fracture or dislocation seen.  IMPRESSION: Interval replacement of the left hip prosthesis with discontinuity of the upper cerclage wire.   Electronically Signed   By: Enrique Sack M.D.   On: 09/08/2013 20:31   Dg Chest Port 1 View  09/12/2013   CLINICAL DATA:  63 year old female with fever, cough and congestion.  EXAM: PORTABLE CHEST - 1 VIEW  COMPARISON:  09/10/2013 and prior radiographs dating back to 07/12/2013.  FINDINGS: The cardiomediastinal silhouette is unremarkable.  There is no evidence of focal airspace disease, pulmonary edema, suspicious pulmonary nodule/mass, pleural effusion, or pneumothorax. No acute bony abnormalities are identified.  IMPRESSION: No  active disease.   Electronically Signed   By: Hassan Rowan M.D.   On: 09/12/2013 13:46   Dg Chest Port 1 View  09/10/2013   CLINICAL DATA:  Elevated white blood count, cough, shortness of breath  EXAM: PORTABLE CHEST - 1 VIEW  COMPARISON:  07/12/2013  FINDINGS: The heart size and mediastinal contours are within normal limits. Both lungs are clear. The visualized skeletal structures are unremarkable.  IMPRESSION: No active disease.   Electronically Signed   By: Skipper Cliche M.D.   On: 09/10/2013 07:41         Subjective: Pain is controlled.  Patient denies fevers, chills, headache, chest pain, dyspnea, nausea, vomiting, diarrhea, abdominal pain, dysuria, hematuria   Objective: Filed Vitals:   09/19/13 0610 09/19/13 1406 09/19/13 2140 09/20/13 0616  BP: 128/61 116/61 123/53 108/57  Pulse: 86 81 83 87  Temp: 97.7 F (36.5 C) 98.4 F (36.9 C) 98.6 F (37 C) 97.9 F (36.6 C)  TempSrc: Oral Oral Oral Oral  Resp: 16 16 16 14   Height:      Weight:      SpO2: 97% 96% 98% 97%    Intake/Output Summary (Last 24 hours) at 09/20/13 1454 Last data filed at 09/20/13 0617  Gross per 24 hour  Intake   1250 ml  Output      0 ml  Net   1250 ml   Weight change:  Exam:   General:  Pt is alert, follows commands appropriately, not in acute distress  HEENT: No icterus, No thrush,  [place]/AT  Cardiovascular: RRR, S1/S2, no rubs, no gallops  Respiratory: CTA  bilaterally, no wheezing, no crackles, no rhonchi  Abdomen: Soft/+BS, non tender, non distended, no guarding  Extremities: trace LE edema, No lymphangitis, No petechiae, No rashes, no synovitis  Data Reviewed: Basic Metabolic Panel:  Recent Labs Lab 09/16/13 0533 09/17/13 0540 09/18/13 0519 09/19/13 0547 09/20/13 0525  NA 140 143 142 143 142  K 4.3 3.9 3.5* 3.9 4.2  CL 104 106 105 107 106  CO2 26 27 27 27 28   GLUCOSE 151* 95 87 96 101*  BUN 10 10 11 10 13   CREATININE 0.55 0.61 0.56 0.56 0.59  CALCIUM 8.4 8.4 8.4 8.4 8.3*     Liver Function Tests: No results found for this basename: AST, ALT, ALKPHOS, BILITOT, PROT, ALBUMIN,  in the last 168 hours No results found for this basename: LIPASE, AMYLASE,  in the last 168 hours No results found for this basename: AMMONIA,  in the last 168 hours CBC:  Recent Labs Lab 09/16/13 0533 09/17/13 0540 09/18/13 0519 09/19/13 0547 09/20/13 0525  WBC 12.2* 11.0* 12.3* 12.9* 11.7*  HGB 7.7* 6.8* 9.8* 9.6* 9.4*  HCT 23.7* 20.8* 29.3* 29.7* 29.3*  MCV 90.5 91.6 88.3 90.3 92.1  PLT 374 368 459* 457* 458*   Cardiac Enzymes: No results found for this basename: CKTOTAL, CKMB, CKMBINDEX, TROPONINI,  in the last 168 hours BNP: No components found with this basename: POCBNP,  CBG: No results found for this basename: GLUCAP,  in the last 168 hours  Recent Results (from the past 240 hour(s))  URINE CULTURE     Status: None   Collection Time    09/12/13  3:03 PM      Result Value Ref Range Status   Specimen Description URINE, CLEAN CATCH   Final   Special Requests NONE   Final   Culture  Setup Time     Final   Value: 09/13/2013 02:43     Performed at [facility]     Final   Value: 40,000 COLONIES/ML     Performed at [facility]   Culture     Final   Value: KLEBSIELLA PNEUMONIAE     Performed at [facility]   Report Status 09/15/2013 FINAL   Final   Organism ID, Bacteria KLEBSIELLA PNEUMONIAE   Final  CULTURE, BLOOD (ROUTINE X 2)     Status: None   Collection Time    09/12/13  3:44 PM      Result Value Ref Range Status   Specimen Description BLOOD LEFT ARM   Final   Special Requests     Final   Value: BOTTLES DRAWN AEROBIC AND ANAEROBIC 10CC BOTH BOTTLES   Culture  Setup Time     Final   Value: 09/13/2013 01:20     Performed at [facility]   Culture     Final   Value: NO GROWTH 5 DAYS     Performed at [facility]   Report Status 09/19/2013 FINAL   Final  CULTURE, BLOOD (ROUTINE X 2)     Status:  None   Collection Time    09/12/13  4:34 PM      Result Value Ref Range Status   Specimen Description BLOOD LEFT ARM   Final   Special Requests     Final   Value: BOTTLES DRAWN AEROBIC AND ANAEROBIC 10CC BOTH BOTTLES   Culture  Setup Time     Final   Value: 09/13/2013 01:20  Performed at [facility]     Final   Value: NO GROWTH 5 DAYS     Performed at [facility]   Report Status 09/19/2013 FINAL   Final  ANAEROBIC CULTURE     Status: None   Collection Time    09/15/13  5:51 PM      Result Value Ref Range Status   Specimen Description ABSCESS LEFT HIP HEMATOMA   Final   Special Requests PATIENT ON FOLLOWING VANCOMYCIN   Final   Gram Stain     Final   Value: FEW WBC PRESENT,BOTH PMN AND MONONUCLEAR     NO SQUAMOUS EPITHELIAL CELLS SEEN     NO ORGANISMS SEEN     Performed at [facility]   Culture     Final   Value: NO ANAEROBES ISOLATED     Performed at [facility]   Report Status 09/20/2013 FINAL   Final  CULTURE, ROUTINE-ABSCESS     Status: None   Collection Time    09/15/13  5:51 PM      Result Value Ref Range Status   Specimen Description ABSCESS LEFT HIP HEMATOMA   Final   Special Requests PATIENT ON FOLLOWING VANCOMYCIN   Final   Gram Stain     Final   Value: FEW WBC PRESENT,BOTH PMN AND MONONUCLEAR     NO SQUAMOUS EPITHELIAL CELLS SEEN     NO ORGANISMS SEEN     Performed at [facility]   Culture     Final   Value: MULTIPLE ORGANISMS PRESENT, NONE PREDOMINANT     Note: NO STAPHYLOCOCCUS AUREUS ISOLATED NO GROUP A STREP (S.PYOGENES) ISOLATED     Performed at [facility]   Report Status 09/18/2013 FINAL   Final     Scheduled Meds: . aspirin EC  325 mg Oral Daily  . buPROPion  150 mg Oral BID  . busPIRone  10 mg Oral BID  . citalopram  10 mg Oral q morning - 10a  . cyclobenzaprine  10 mg Oral BID  . docusate sodium  100 mg Oral BID  . doxepin  10 mg Oral QHS  . ertapenem  1 g Intravenous Q24H  .  esomeprazole  20 mg Oral BID  . iron polysaccharides  150 mg Oral BID  . lactose free nutrition  237 mL Oral TID WC  . loratadine  10 mg Oral Daily  . magic mouthwash  5 mL Oral QID  . oxybutynin  5 mg Oral Daily  . polyethylene glycol  17 g Oral Daily  . saccharomyces boulardii  250 mg Oral BID   Continuous Infusions: . sodium chloride 20 mL/hr (09/19/13 2123)  . lactated ringers       Norene Oliveri, DO  [facility] Pager [phone]  If 7PM-7AM, please contact night-coverage [email or web address] Password [email or web address] 09/20/2013, 2:54 PM   LOS: 14 days

## 2013-09-20 NOTE — Progress Notes (Signed)
Error- wrong time

## 2013-09-20 NOTE — Progress Notes (Signed)
Peripherally Inserted Central Catheter/Midline Placement  The IV Nurse has discussed with the patient and/or persons authorized to consent for the patient, the purpose of this procedure and the potential benefits and risks involved with this procedure.  The benefits include less needle sticks, lab draws from the catheter and patient may be discharged home with the catheter.  Risks include, but not limited to, infection, bleeding, blood clot (thrombus formation), and puncture of an artery; nerve damage and irregular heat beat.  Alternatives to this procedure were also discussed.  PICC/Midline Placement Documentation  PICC / Midline Single Lumen 59/56/38 PICC Right Basilic 41 cm 0 cm (Active)  Indication for Insertion or Continuance of Line Home intravenous therapies (PICC only) 09/20/2013  2:00 PM  Exposed Catheter (cm) 0 cm 09/20/2013  2:00 PM  Dressing Change Due 09/27/13 09/20/2013  2:00 PM       Jule Economy Horton 09/20/2013, 2:50 PM

## 2013-09-20 NOTE — Progress Notes (Signed)
Physical Therapy Treatment Patient Details Name: [name] MRN: [id number] DOB: Jan 21, 1950 Today's Date: 09/20/2013    History of Present Illness LEFT FEMORAL REVISION -Left.. Pt with history of RAKA.    Pt did have I and D of L hip hematoma 9/2.      PT Comments    Pt tolerating WC mobility .attempted to stand x 1 with much difficulty.  Follow Up Recommendations  SNF     Equipment Recommendations  None recommended by PT    Recommendations for Other Services       Precautions / Restrictions Precautions Precautions: Fall Precaution Comments: post precautions, R AKA without prosthesis Restrictions LLE Weight Bearing: Weight bearing as tolerated    Mobility  Bed Mobility Overal bed mobility: Needs Assistance Bed Mobility: Supine to Sit     Supine to sit: Min assist        Transfers Overall transfer level: Needs assistance Equipment used:  (sliding board.) Transfers: Lateral/Scoot Transfers Sit to Stand: Mod assist         General transfer comment: used sliding board lateral scoot towards pt's R from bed to wheelchair.  Used bed pad between pAtient and board to assist sliding  Ambulation/Gait             General Gait Details: attempted to stand by pulling up at rail, pt got to stand  for a split second. stated L thigh and hip   Hotel manager mobility: Yes Wheelchair propulsion: Both upper extremities Wheelchair parts: Supervision/cueing Distance: 400' Education administrator Details (indicate cue type and reason): cues for turning, supervision for new PICC line  Modified Rankin (Stroke Patients Only)       Balance   Sitting-balance support: Bilateral upper extremity supported;Feet supported Sitting balance-Leahy Scale: Good Sitting balance - Comments: able to weight shift for Board to be placed.                            Cognition Arousal/Alertness: Awake/alert                           Exercises      General Comments        Pertinent Vitals/Pain Pain Score: 3  Pain Location: L hip Pain Descriptors / Indicators: Sore    Home Living                      Prior Function            PT Goals (current goals can now be found in the care plan section) Progress towards PT goals: Progressing toward goals    Frequency  Min 5X/week    PT Plan Current plan remains appropriate    Co-evaluation             End of Session   Activity Tolerance: Patient tolerated treatment well Patient left: in chair;with call bell/phone within reach     Time: 2505-3976 PT Time Calculation (min): 17 min  Charges:  $Self Care/Home Management: 2022-09-22                    G Codes:      Claretha Cooper 09/20/2013, 5:00 PM Tresa Endo PT [phone]

## 2013-09-21 LAB — CBC
HEMATOCRIT: 27.9 % — AB (ref 36.0–46.0)
Hemoglobin: 9 g/dL — ABNORMAL LOW (ref 12.0–15.0)
MCH: 29.7 pg (ref 26.0–34.0)
MCHC: 32.3 g/dL (ref 30.0–36.0)
MCV: 92.1 fL (ref 78.0–100.0)
Platelets: 455 10*3/uL — ABNORMAL HIGH (ref 150–400)
RBC: 3.03 MIL/uL — AB (ref 3.87–5.11)
RDW: 15 % (ref 11.5–15.5)
WBC: 12.9 10*3/uL — ABNORMAL HIGH (ref 4.0–10.5)

## 2013-09-21 LAB — BASIC METABOLIC PANEL
Anion gap: 10 (ref 5–15)
BUN: 10 mg/dL (ref 6–23)
CO2: 27 meq/L (ref 19–32)
Calcium: 8.6 mg/dL (ref 8.4–10.5)
Chloride: 104 mEq/L (ref 96–112)
Creatinine, Ser: 0.56 mg/dL (ref 0.50–1.10)
GFR calc Af Amer: >90 mL/min (ref >90)
GFR calc non Af Amer: >90 mL/min (ref >90)
GLUCOSE: 97 mg/dL (ref 70–99)
POTASSIUM: 4 meq/L (ref 3.7–5.3)
SODIUM: 141 meq/L (ref 137–147)

## 2013-09-21 MED ORDER — HYDROMORPHONE HCL 2 MG PO TABS: 1.0000 mg | ORAL_TABLET | ORAL | Status: DC | PRN

## 2013-09-21 MED ORDER — CYCLOBENZAPRINE HCL 10 MG PO TABS: 10.0000 mg | ORAL_TABLET | Freq: Two times a day (BID) | ORAL | Status: DC

## 2013-09-21 MED ORDER — ASPIRIN 325 MG PO TBEC: 325.0000 mg | DELAYED_RELEASE_TABLET | Freq: Every day | ORAL | Status: DC

## 2013-09-21 MED ORDER — HEPARIN SOD (PORK) LOCK FLUSH 100 UNIT/ML IV SOLN
250.0000 [IU] | INTRAVENOUS | Status: AC | PRN
  Administered 2013-09-21: 250 [IU]

## 2013-09-21 MED ORDER — SODIUM CHLORIDE 0.9 % IV SOLN: 1.0000 g | INTRAVENOUS | Status: DC

## 2013-09-21 NOTE — Progress Notes (Signed)
Clinical Social Work Department CLINICAL SOCIAL WORK PLACEMENT NOTE 09/21/2013  Patient:  [name], [name]  Account Number:  [id number] Admit date:  09/06/2013  Clinical Social Worker:  Werner Lean, LCSW  Date/time:  09/21/2013 04:18 PM  Clinical Social Work is seeking post-discharge placement for this patient at the following level of care:   SKILLED NURSING   (*CSW will update this form in Epic as items are completed)   09/21/2013  Patient/family provided with [place] Department of Clinical Social Work's list of facilities offering this level of care within the geographic area requested by the patient (or if unable, by the patient's family).  09/21/2013  Patient/family informed of their freedom to choose among providers that offer the needed level of care, that participate in Medicare, Medicaid or managed care program needed by the patient, have an available bed and are willing to accept the patient.    Patient/family informed of [facility]' ownership interest in [facility], as well as of the fact that they are under no obligation to receive care at this facility.  PASARR submitted to EDS on 09/06/2013 PASARR number received on 09/06/2013  FL2 transmitted to all facilities in geographic area requested by pt/family on  09/07/2013 FL2 transmitted to all facilities within larger geographic area on   Patient informed that his/her managed care company has contracts with or will negotiate with  certain facilities, including the following:     Patient/family informed of bed offers received:  09/07/2013 Patient chooses bed at [facility] Physician recommends and patient chooses bed at    Patient to be transferred to [facility] on  09/21/2013 Patient to be transferred to facility by [facility] Patient and family notified of transfer on 09/21/2013 Name of family member notified:  Daughter  The following physician request were entered in  Epic:   Additional Comments:  Pt / daughter in agreement with d/c to SNF today. [facility] transport needed. [employer] authorized SNF placement. Nsg reviewed d/c summary, scripts, avs. Scripts included in d/c packet.  Werner Lean LCSW [phone]

## 2013-09-21 NOTE — Discharge Summary (Signed)
Physician Discharge Summary  [name] YKD:[id number] DOB: 03-26-1950 DOA: 09/06/2013  PCP: Shary Key, MD  Admit date: 09/06/2013 Discharge date: 09/21/2013  Recommendations for Outpatient Follow-up:  1. Pt will need to follow up with PCP in 2 weeks post discharge 2. Please obtain BMP every 7 days and send results to Dr. Lita Mains 3. Please also check CBC every 7 days and send results to Dr. Lita Mains 4. Continue ertapenem, 1 gram IV daily, with the last dose on 10/29/13 5. Remove PICC line after last dose of ertapenem   Discharge Diagnoses:  Left Hip Hematoma  -09/15/13 evacuation  -intraoperative culture=polymicrobial without S. aureus  -remain concerned about possible infected prosthesis  -consulted ID--discussed with Dr. Milana Na ertapenem (09/18/13)--pt tolerated well during hospitalization  -09/20/13--PICC line placed--to SNF with ertapenem  -The patient will continue with ertapenem through 10/29/2013 which will complete 42 days therarpy -remove PICC line after last dose of ertapenem on 10/29/13 SIRS  -secondary to wound infection  -09/15/2013 L-hip I&D-->hematoma-->polymicrobial  - Repeat CXR and UA negative  - 8/30 Blood cultures NGTD  - Continue vancomycin and aztreonam  - hemovac "fell out" evening of 09/15/13  Left leg hip prosthesis dislocation  -original L-THA on 07/28/13  - s/p left hip prosthesis revision on 8/26 by Dr. Wynelle Link  - Weight bearing status per orthopedics--WBAT -DVT prophylaxis--per orthopedics -- recommended aspirin   Leukocytosis,  -trending down, slightly up from surgery 9/2--likely stress demargination  - continue abx as above  -remains afebrile and hemodynamically stable  -WBC 4.9 on the day of discharge Acute blood loss anemia  -from recent surgery superimposed on iron deficiency and chronic disease.  - hgb improved post transfusion--hemoglobin 9.0 on day of discharge - Continue iron supplements.  - Transfused 2 units  PRBC intraoperatively on 8/26.  - Transfused 1 unit PRBC 8/28  - Transfused 2 units PRBC 09/17/13  Acute hypoxic respiratory failure  -likely due to atelectasis, spirometry use post-operative,  -resolved.  HTN (hypertension),  -blood pressure low normal. Continue to hold irbesartan.  Fibromyalgia, continue prn dilaudid and celexa  GERD (gastroesophageal reflux disease), stable, continue nexium  Depression/Anxiety, stable, continue wellbutrin, celexa, and buspar, and continue increased frequency of prn ativan. Needs close follow up with primary psychiatrist  Pruritis with history of hives being managed by dermatologist, improved. Continue loratadine, prn benadryl, and doxepin  Hyponatremia,  -may be due to dehydration and fluid shifts post large surgery, mild and asymptomatic.  -improved with IVF  Family Communication: husband updated at beside  Disposition Plan: [facility] 09/21/13  Consultants:  Ortho. Dr. Wynelle Link Procedures:  Left femoral revision on 09/08/13  Left femoral I&D/washout--09/15/13 Antibiotics:  Perioperative Vancomycin  Vancomycin 8/30 >> 09/18/13  Aztreonam 8/30 >>09/18/13  ertapenem 09/18/13>>>   Discharge Condition: stable  Disposition: SNF Follow-up Information   Follow up with [name], MD. Schedule an appointment as soon as possible for a visit on 09/28/2013. (Call office for appointment at [phone] to set up follow up for patient on Tuesday 09/28/2013.)    Specialty:  Orthopedic Surgery   Contact information:   [address] [place] [address] [phone]       Follow up with Bobby Rumpf, MD In 1 week.   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 The patient initially had a left total hip arthroplasty on 07/28/2013. She was in rehabilitation 4 days prior to admission when she experienced a muscle pull-like feeling while getting on the bedpan. She had persistent pain and was found to have a dislocation of her prosthesis. She underwent repair on 8/26, however, her post-operative course has been complicated by SIRS and possible wound infection. 2 days after her revision, the patient developed leukocytosis and fever. She was started on vancomycin and aztreonam on 09/12/13, but she has had intermittent doses of vancomycin prior to that date. After antibiotics were started, the patient defervesced and the baby seems improved. The patient ultimately underwent evacuation of her hematoma on 09/15/2013. Intraoperative cultures on 09/15/2013 were polymicrobial without predominant organism or Staphylococcus aureus which raised concern about a possible prosthetic hip infection. As a result, infectious disease was consulted. Infectious disease recommended changing the patient's antibiotics to ertapenem to complete a 42 day course.     Consultants: Ortho--Dr. Wynelle Link ID--Dr. Hatcher  Discharge Exam: Filed Vitals:   09/21/13 0605  BP: 117/57  Pulse: 89  Temp: 98.4 F (36.9 C)  Resp: 20   Filed Vitals:   09/20/13 0616 09/20/13 1607 09/20/13 2130 09/21/13 0605  BP: 108/57 122/69 118/56 117/57  Pulse: 87 98 89 89  Temp: 97.9 F (36.6 C) 99.1 F (37.3 C) 98.9 F (37.2 C) 98.4 F (36.9 C)  TempSrc: Oral Oral Oral Oral  Resp: 14 18 20 20   Height:      Weight:      SpO2: 97% 98% 98% 96%   General: A&O x 3, NAD, pleasant, cooperative Cardiovascular: RRR, no rub, no gallop, no S3 Respiratory: CTAB, no wheeze, no rhonchi Abdomen:soft, nontender, nondistended, positive  bowel sounds Extremities: Left hip incision well approximated with staples without any surrounding erythema, necrosis. There is a scant amount of dry blood around the incision site.   Discharge Instructions      Discharge Instructions   Call MD / Call 911    Complete by:  As directed   If you experience chest pain or shortness of breath, CALL 911 and be transported to the hospital emergency room.  If you develope a fever above 101 F, pus (white drainage) or increased drainage or redness at the wound, or calf pain, call your surgeon's office.     Change dressing    Complete by:  As directed   You may change your dressing dressing daily with sterile 4 x 4 inch gauze dressing and paper tape.  Do not submerge the incision under water.     Constipation Prevention    Complete by:  As directed   Drink plenty of fluids.  Prune juice may be helpful.  You may use a stool softener, such as Colace (over the counter) 100 mg twice a day.  Use MiraLax (over the counter) for constipation as needed.     Diet - low sodium heart healthy    Complete by:  As directed      Diet - low sodium heart healthy    Complete by:  As directed      Discharge instructions    Complete by:  As directed   Pick up stool softner and laxative for home. Do not  submerge incision under water. May shower. Continue to use ice for pain and swelling from surgery. Hip precautions.  Total Hip Protocol.  Take a full dose 325 mg Aspirin daily for three weeks and then switch over to a baby 81 mg Aspirin daily for three more weeks.     Discharge instructions    Complete by:  As directed   Do NOT take irbesartan until you follow up with your primary care doctor and you are instructed to restart.     Do not sit on low chairs, stoools or toilet seats, as it may be difficult to get up from low surfaces    Complete by:  As directed      Driving restrictions    Complete by:  As directed   No driving until released by the physician.      Follow the hip precautions as taught in Physical Therapy    Complete by:  As directed      Increase activity slowly as tolerated    Complete by:  As directed      Increase activity slowly    Complete by:  As directed      Lifting restrictions    Complete by:  As directed   No lifting until released by the physician.     Patient may shower    Complete by:  As directed   You may shower without a dressing once there is no drainage.  Do not wash over the wound.  If drainage remains, do not shower until drainage stops.     TED hose    Complete by:  As directed   Use stockings (TED hose) for 3 weeks on both leg(s).  You may remove them at night for sleeping.     Weight bearing as tolerated    Complete by:  As directed             Medication List    STOP taking these medications       irbesartan 150 MG tablet  Commonly known as:  AVAPRO     rivaroxaban 10 MG Tabs tablet  Commonly known as:  XARELTO      TAKE these medications       acetaminophen 325 MG tablet  Commonly known as:  TYLENOL  Take 2 tablets (650 mg total) by mouth every 6 (six) hours as needed for mild pain (or Fever >/= 101).     albuterol 108 (90 BASE) MCG/ACT inhaler  Commonly known as:  PROVENTIL HFA;VENTOLIN HFA  Inhale 2 puffs into the lungs every 6 (six) hours as needed for wheezing or shortness of breath.     aspirin 325 MG EC tablet  Take 1 tablet (325 mg total) by mouth daily. Take a full dose 325 mg Aspirin daily for three more weeks, and then switch over to a baby 81 mg Aspirin for three more weeks.     buPROPion 150 MG 12 hr tablet  Commonly known as:  WELLBUTRIN SR  Take 150 mg by mouth 2 (two) times daily.     busPIRone 10 MG tablet  Commonly known as:  BUSPAR  Take 10 mg by mouth 2 (two) times daily.     CALTRATE 600 PLUS-VIT D PO  Take 1 tablet by mouth 2 (two) times daily.     citalopram 10 MG tablet  Commonly known as:  CELEXA  Take 10 mg by mouth every morning.     cyclobenzaprine  10 MG tablet  Commonly  known as:  FLEXERIL  Take 1 tablet (10 mg total) by mouth 2 (two) times daily.     diphenhydrAMINE 25 MG tablet  Commonly known as:  BENADRYL  Take 1 tablet (25 mg total) by mouth every 6 (six) hours as needed for itching.     doxepin 10 MG capsule  Commonly known as:  SINEQUAN  Take 10 mg by mouth at bedtime.     ertapenem 1 g in sodium chloride 0.9 % 50 mL  Inject 1 g into the vein daily. With the last dose to be given on 10/29/13     esomeprazole 20 MG capsule  Commonly known as:  NEXIUM  Take 1 capsule (20 mg total) by mouth 2 (two) times daily before a meal.     fexofenadine 180 MG tablet  Commonly known as:  ALLEGRA  Take 180 mg by mouth daily.     fluocinolone 0.01 % external solution  Commonly known as:  SYNALAR  Place 2 drops into both ears 2 (two) times daily as needed (itching).     HYDROmorphone 2 MG tablet  Commonly known as:  DILAUDID  Take 0.5-1 tablets (1-2 mg total) by mouth every 3 (three) hours as needed for moderate pain or severe pain.     iron polysaccharides 150 MG capsule  Commonly known as:  NIFEREX  Take 1 capsule (150 mg total) by mouth 2 (two) times daily.     LORazepam 0.5 MG tablet  Commonly known as:  ATIVAN  Take 1 tablet (0.5 mg total) by mouth 2 (two) times daily as needed for anxiety.     metoCLOPramide 5 MG tablet  Commonly known as:  REGLAN  Take 1-2 tablets (5-10 mg total) by mouth every 8 (eight) hours as needed for nausea (if ondansetron (ZOFRAN) ineffective.).     multivitamins with iron Tabs tablet  Take 1 tablet by mouth daily.     ondansetron 4 MG tablet  Commonly known as:  ZOFRAN  Take 1 tablet (4 mg total) by mouth every 6 (six) hours as needed for nausea.     oxybutynin 5 MG 24 hr tablet  Commonly known as:  DITROPAN-XL  Take 5 mg by mouth daily.     polyethylene glycol packet  Commonly known as:  MIRALAX / GLYCOLAX  Take 17 g by mouth 2 (two) times daily.     senna 8.6 MG Tabs tablet    Commonly known as:  SENOKOT  Take 1 tablet by mouth 2 (two) times daily.         The results of significant diagnostics from this hospitalization (including imaging, microbiology, ancillary and laboratory) are listed below for reference.    Significant Diagnostic Studies: Dg Hip Complete Left  09/06/2013   CLINICAL DATA:  Left leg pain  EXAM: LEFT HIP - COMPLETE 2+ VIEW  COMPARISON:  None.  FINDINGS: There is a left total hip arthroplasty. There is superior dislocation of the left femoral head component relative to the acetabular component. There is no acute fracture.  There is generalized osteopenia.  IMPRESSION: Left total hip arthroplasty with superior dislocation of the left femoral head component relative to the acetabulum.   Electronically Signed   By: Kathreen Devoid   On: 09/06/2013 18:25   Dg Femur Left  09/06/2013   CLINICAL DATA:  Increasing LEFT leg pain on Thursday, post hip replacement in July, hip injury  EXAM: LEFT FEMUR - 2 VIEW  COMPARISON:  Preoperative exam 07/12/2013  FINDINGS: Osseous  demineralization.  LEFT hip prosthesis identified with superior dislocation of the femoral component.  Cerclage wires and lateral plate at the proximal LEFT femur appear intact.  No periprosthetic lucency or fracture identified.  Visualized pelvis intact.  Knee joint spaces preserved.  IMPRESSION: Dislocated LEFT hip prosthesis.  Osseous demineralization.   Electronically Signed   By: Lavonia Dana M.D.   On: 09/06/2013 18:24   Dg Pelvis Portable  09/08/2013   CLINICAL DATA:  Status post left hip surgery.  EXAM: PORTABLE PELVIS 1-2 VIEWS  COMPARISON:  08/21/2013.  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 A new left total hip prosthesis is demonstrated with interval discontinuity of the upper cerclage wire. No acute fracture or dislocation seen.  IMPRESSION: Interval replacement of the left hip prosthesis with  discontinuity of the upper cerclage wire.   Electronically Signed   By: Enrique Sack M.D.   On: 09/08/2013 20:31   Dg Chest Port 1 View  09/12/2013   CLINICAL DATA:  63 year old female with fever, cough and congestion.  EXAM: PORTABLE CHEST - 1 VIEW  COMPARISON:  09/10/2013 and prior radiographs dating back to 07/12/2013.  FINDINGS: The cardiomediastinal silhouette is unremarkable.  There is no evidence of focal airspace disease, pulmonary edema, suspicious pulmonary nodule/mass, pleural effusion, or pneumothorax. No acute bony abnormalities are identified.  IMPRESSION: No active disease.   Electronically Signed   By: Hassan Rowan M.D.   On: 09/12/2013 13:46   Dg Chest Port 1 View  09/10/2013   CLINICAL DATA:  Elevated white blood count, cough, shortness of breath  EXAM: PORTABLE CHEST - 1 VIEW  COMPARISON:  07/12/2013  FINDINGS: The heart size and mediastinal contours are within normal limits. Both lungs are clear. The visualized skeletal structures are unremarkable.  IMPRESSION: No active disease.   Electronically Signed   By: Skipper Cliche M.D.   On: 09/10/2013 07:41     Microbiology: Recent Results (from the past 240 hour(s))  URINE CULTURE     Status: None   Collection Time    09/12/13  3:03 PM      Result Value Ref Range Status   Specimen Description URINE, CLEAN CATCH   Final   Special Requests NONE   Final   Culture  Setup Time     Final   Value: 09/13/2013 02:43     Performed at [facility]     Final   Value: 40,000 COLONIES/ML     Performed at [facility]   Culture     Final   Value: KLEBSIELLA PNEUMONIAE     Performed at [facility]   Report Status 09/15/2013 FINAL   Final   Organism ID, Bacteria KLEBSIELLA PNEUMONIAE   Final  CULTURE, BLOOD (ROUTINE X 2)     Status: None   Collection Time    09/12/13  3:44 PM      Result Value Ref Range Status   Specimen Description BLOOD LEFT ARM   Final   Special Requests     Final   Value:  BOTTLES DRAWN AEROBIC AND ANAEROBIC 10CC BOTH BOTTLES   Culture  Setup Time     Final   Value: 09/13/2013 01:20     Performed at [facility]   Culture     Final   Value: NO GROWTH 5 DAYS     Performed at [facility]  Report Status 09/19/2013 FINAL   Final  CULTURE, BLOOD (ROUTINE X 2)     Status: None   Collection Time    09/12/13  4:34 PM      Result Value Ref Range Status   Specimen Description BLOOD LEFT ARM   Final   Special Requests     Final   Value: BOTTLES DRAWN AEROBIC AND ANAEROBIC 10CC BOTH BOTTLES   Culture  Setup Time     Final   Value: 09/13/2013 01:20     Performed at [facility]   Culture     Final   Value: NO GROWTH 5 DAYS     Performed at [facility]   Report Status 09/19/2013 FINAL   Final  ANAEROBIC CULTURE     Status: None   Collection Time    09/15/13  5:51 PM      Result Value Ref Range Status   Specimen Description ABSCESS LEFT HIP HEMATOMA   Final   Special Requests PATIENT ON FOLLOWING VANCOMYCIN   Final   Gram Stain     Final   Value: FEW WBC PRESENT,BOTH PMN AND MONONUCLEAR     NO SQUAMOUS EPITHELIAL CELLS SEEN     NO ORGANISMS SEEN     Performed at [facility]   Culture     Final   Value: NO ANAEROBES ISOLATED     Performed at [facility]   Report Status 09/20/2013 FINAL   Final  CULTURE, ROUTINE-ABSCESS     Status: None   Collection Time    09/15/13  5:51 PM      Result Value Ref Range Status   Specimen Description ABSCESS LEFT HIP HEMATOMA   Final   Special Requests PATIENT ON FOLLOWING VANCOMYCIN   Final   Gram Stain     Final   Value: FEW WBC PRESENT,BOTH PMN AND MONONUCLEAR     NO SQUAMOUS EPITHELIAL CELLS SEEN     NO ORGANISMS SEEN     Performed at [facility]   Culture     Final   Value: MULTIPLE ORGANISMS PRESENT, NONE PREDOMINANT     Note: NO STAPHYLOCOCCUS AUREUS ISOLATED NO GROUP A STREP (S.PYOGENES) ISOLATED     Performed at [facility]   Report Status  09/18/2013 FINAL   Final     Labs: Basic Metabolic Panel:  Recent Labs Lab 09/17/13 0540 09/18/13 0519 09/19/13 0547 09/20/13 0525 09/21/13 0420  NA 143 142 143 142 141  K 3.9 3.5* 3.9 4.2 4.0  CL 106 105 107 106 104  CO2 27 27 27 28 27   GLUCOSE 95 87 96 101* 97  BUN 10 11 10 13 10   CREATININE 0.61 0.56 0.56 0.59 0.56  CALCIUM 8.4 8.4 8.4 8.3* 8.6   Liver Function Tests: No results found for this basename: AST, ALT, ALKPHOS, BILITOT, PROT, ALBUMIN,  in the last 168 hours No results found for this basename: LIPASE, AMYLASE,  in the last 168 hours No results found for this basename: AMMONIA,  in the last 168 hours CBC:  Recent Labs Lab 09/17/13 0540 09/18/13 0519 09/19/13 0547 09/20/13 0525 09/21/13 0420  WBC 11.0* 12.3* 12.9* 11.7* 12.9*  HGB 6.8* 9.8* 9.6* 9.4* 9.0*  HCT 20.8* 29.3* 29.7* 29.3* 27.9*  MCV 91.6 88.3 90.3 92.1 92.1  PLT 368 459* 457* 458* 455*   Cardiac Enzymes: No results found for this basename: CKTOTAL, CKMB, CKMBINDEX, TROPONINI,  in the last 168 hours BNP: No  components found with this basename: POCBNP,  CBG: No results found for this basename: GLUCAP,  in the last 168 hours  Time coordinating discharge:  Greater than 30 minutes  Signed:  Redell Nazir, DO [facility] Pager: [phone] 09/21/2013, 8:50 AM

## 2013-09-21 NOTE — Progress Notes (Signed)
PICC line capped off, flushed with 10cc NS, GBR followed by Heparin 250 U. [employer] [employer]

## 2013-09-21 NOTE — Progress Notes (Addendum)
Subjective: 6 Days Post-Op Procedure(s) (LRB): IRRIGATION AND DEBRIDEMENT LEFT HIP HEMATOMA (Left) Patient reports pain as mild.   Patient seen in rounds with Dr. Wynelle Link. Patient is well, and has had no acute complaints or problems Patient is ready to go to [facility] today.  PICC line placed yesterday. Will likely receive 6 weeks of IV invanz.  Objective: Vital signs in last 24 hours: Temp:  [98.4 F (36.9 C)-99.1 F (37.3 C)] 98.4 F (36.9 C) (09/08 0605) Pulse Rate:  [89-98] 89 (09/08 0605) Resp:  [18-20] 20 (09/08 0605) BP: (117-122)/(56-69) 117/57 mmHg (09/08 0605) SpO2:  [96 %-98 %] 96 % (09/08 0605)  Intake/Output from previous day:  Intake/Output Summary (Last 24 hours) at 09/21/13 0728 Last data filed at 09/21/13 3435  Gross per 24 hour  Intake    240 ml  Output   1100 ml  Net   -860 ml   Labs:  Recent Labs  09/19/13 0547 09/20/13 0525 09/21/13 0420  HGB 9.6* 9.4* 9.0*    Recent Labs  09/20/13 0525 09/21/13 0420  WBC 11.7* 12.9*  RBC 3.18* 3.03*  HCT 29.3* 27.9*  PLT 458* 455*    Recent Labs  09/20/13 0525 09/21/13 0420  NA 142 141  K 4.2 4.0  CL 106 104  CO2 28 27  BUN 13 10  CREATININE 0.59 0.56  GLUCOSE 101* 97  CALCIUM 8.3* 8.6   No results found for this basename: LABPT, INR,  in the last 72 hours  EXAM: General - Patient is Alert, Appropriate and Oriented Extremity - Neurovascular intact Sensation intact distally Dorsiflexion/Plantar flexion intact Incision - clean, dry, no drainage, minimal erythema near incision. Motor Function - intact, moving foot and toes well on exam.   Assessment/Plan: 6 Days Post-Op Procedure(s) (LRB): IRRIGATION AND DEBRIDEMENT LEFT HIP HEMATOMA (Left) Procedure(s) (LRB): IRRIGATION AND DEBRIDEMENT LEFT HIP HEMATOMA (Left) Past Medical History  Diagnosis Date  . Complication of anesthesia     NAUSEA  . Chest pain     pt states due to esophageal problem -  primary care MD aware   .  Hives     since pneumonia vaccine   . Asthma   . Depression   . Anxiety   . Fibromyalgia   . Spondylosis   . Bulging disc   . 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 Phantom limb pain     rt leg  . Leg pain, left   . Eczema     ears  . GERD (gastroesophageal reflux disease)     " alot of burping"  . Constipation, chronic   . Urgency incontinence   . History of transfusion   . Cancer     hx sarcoma rt knee with amputation  . Difficulty sleeping   . Sleep apnea     pt had recent sleep study done but does not know results - report called for and put on chart showing significant obstructive sleep apnea  . Hypertension   . Back pain 08/19/13    low back and  left leg pain due to nerve problems per pt  . PONV (postoperative nausea and vomiting)   . Peripheral neuropathy   . History of bronchitis   . History of pneumonia   . Hiatal hernia   . History of scarlet fever   . Postmenopausal   . History of measles   . History of mumps   . History of  rubella    Active Problems:   Fibromyalgia   GERD (gastroesophageal reflux disease)   Postoperative anemia due to acute blood loss   Depression   Anxiety state, unspecified   Phantom limb (syndrome)   Obstructive sleep apnea   Hyponatremia   Transfusion history   Dislocation of hip joint prosthesis   HTN (hypertension)   Left hip pain   Femoral loosening of prosthetic left hip   SIRS (systemic inflammatory response syndrome)   Acute blood loss anemia   Postoperative wound infection of left hip  Estimated body mass index is 26.88 kg/(m^2) as calculated from the following:   Height as of this encounter: 5\' 2"  (1.575 m).   Weight as of this encounter: 66.679 kg (147 lb). Up with therapy Discharge to SNF with IV ABX as per Medicine.  Okay from an ortho standpoint.   Follow up - in 1 week, next Tuesday with Dr. Wynelle Link on 09/28/2013. Call office at [phone] for appointment. Activity - WBAT to the left leg Disposition -  Skilled nursing facility as per Medicine, Plan is for [facility]. D/C Meds - Aspirin 325 mg, Flexeril 10 mg, Dilaudid 1 mg DVT Prophylaxis - Aspirin  Arlee Muslim, PA-C Orthopaedic Surgery 09/21/2013, 7:28 AM

## 2013-09-21 NOTE — Progress Notes (Signed)
Occupational Therapy Treatment Patient Details Name: [name] MRN: [id number] DOB: 01-19-1950 Today's Date: 09/21/2013    History of present illness LEFT FEMORAL REVISION -Left.. Pt with history of RAKA.    Pt did have I and D of L hip hematoma 9/2.     OT comments  Making good progress with OT.  PT came to assist with sit to stand for adls.  Educated on leaning with someone guarding LLE against IR.    Follow Up Recommendations  SNF    Equipment Recommendations  None recommended by OT    Recommendations for Other Services      Precautions / Restrictions Precautions Precautions: Fall Precaution Booklet Issued: Yes (comment) Precaution Comments: post precautions, R AKA without prosthesis Restrictions Weight Bearing Restrictions: No LLE Weight Bearing: Weight bearing as tolerated       Mobility Bed Mobility               Transfers Overall transfer level: Needs assistance Equipment used: Rolling walker (2 wheeled) Transfers: Sit to/from [employer] Transfers Sit to Stand: +2 safety/equipment;Max assist        General transfer comment: from recliner    Balance                                   ADL Overall ADL's : Needs assistance/impaired                                       General ADL Comments: pt completed LB bathing and dressing with reacher.  PT assisted with standing for peri area and pulling underwear up:  +2 mod A for task, max A to maintain standing.  Also educated on leaning with therapist guarding RLE to avoid internal rotation.  Reviewed hip precautions and recommended leaning to R, when possible as hip would externally rotate instead of internal.  Pt would benefit from toilet aide      Vision                     Perception     Praxis      Cognition   Behavior During Therapy: [facility] for tasks assessed/performed Overall Cognitive Status: Within Functional Limits for tasks assessed                        Extremity/Trunk Assessment               Exercises    Shoulder Instructions       General Comments      Pertinent Vitals/ Pain       Pain Score: 6  Pain Location: groin Pain Descriptors / Indicators: Aching Pain Intervention(s): Limited activity within patient's tolerance;Repositioned;Ice applied  Home Living                                          Prior Functioning/Environment              Frequency Min 2X/week     Progress Toward Goals  OT Goals(current goals can now be found in the care plan section)  Progress towards OT goals: Progressing toward goals     Plan      Co-evaluation  End of Session     Activity Tolerance Patient tolerated treatment well   Patient Left in chair;with call bell/phone within reach   Nurse Communication          Time: 4665-9935 OT Time Calculation (min): 26 min  Charges: OT General Charges $OT Visit: 1 Procedure OT Treatments $Self Care/Home Management : 23-37 mins  Ameyah Bangura 09/21/2013, 11:34 AM  Lesle Chris, OTR/L [phone] 09/21/2013

## 2013-09-21 NOTE — Progress Notes (Signed)
Physical Therapy Treatment Patient Details Name: [name] MRN: [id number] DOB: [birth year] Today's Date: 09/21/2013    History of Present Illness LEFT FEMORAL REVISION -Left.. Pt with history of RAKA.    Pt did have I and D of L hip hematoma 9/2.      PT Comments    Reviewed details of Posterior  Hip precautions for L hip. Patient is very pleased today  With standing  Up and pivot to recliner without twisting L hip. Plans for SNF today.  Follow Up Recommendations  SNF     Equipment Recommendations       Recommendations for Other Services       Precautions / Restrictions Precautions Precautions: Fall Precaution Booklet Issued: Yes (comment) Precaution Comments: post precautions, R AKA without prosthesis Restrictions Weight Bearing Restrictions: No LLE Weight Bearing: Weight bearing as tolerated    Mobility  Bed Mobility Overal bed mobility: Needs Assistance Bed Mobility: Supine to Sit           General bed mobility comments: much better bed mobility with decreased c/o L hip pain. cues  for Posterior hip precautions. Pt did  start to internally rotate L hip, realized what she was doing and corrected. Used rails to turn on the bed. and able to get to sitting upright without PT assist.  Transfers Overall transfer level: Needs assistance Equipment used: Rolling walker (2 wheeled) Transfers: Sit to/from [employer] Sit to Stand: +2 safety/equipment;Mod assist Stand pivot transfers: +2 safety/equipment;Mod assist       General transfer comment: bed elevated to near standing , then patient stood at RW x 30 seconds then 15 seconds. patient then cvarefully able to hop on L eft leg to pivot to recliner. Did not twist at all.   Ambulation/Gait                 Stairs            Wheelchair Mobility    Modified Rankin (Stroke Patients Only)       Balance                                    Cognition  Arousal/Alertness: Awake/alert                          Exercises Total Joint Exercises Quad Sets: AROM;Left;10 reps Short Arc Quad: AROM;Left;10 reps;Supine Heel Slides: AAROM;Left;10 reps;Supine Hip ABduction/ADduction: AAROM;Left;10 reps;Supine    General Comments        Pertinent Vitals/Pain Pain Score: 7  Pain Location: , states she felt a pull in L groin when getting on bedpan Pain Descriptors / Indicators: [name]                      Prior Function            PT Goals (current goals can now be found in the care plan section) Progress towards PT goals: Progressing toward goals    Frequency  Min 5X/week    PT Plan Current plan remains appropriate    Co-evaluation             End of Session Equipment Utilized During Treatment: Gait belt Activity Tolerance: Patient tolerated treatment well Patient left: in chair;with call bell/phone within reach     Time: 7673-4193 PT Time Calculation (min): 38 min  Charges:  $Therapeutic Exercise: 8-22 mins $Therapeutic Activity: 23-37 mins                    G Codes:      Claretha Cooper 09/21/2013, 9:32 AM Tresa Endo PT [phone]

## 2013-09-21 NOTE — Progress Notes (Signed)
Physical Therapy Treatment Patient Details Name: [name] MRN: [id number] DOB: 11/30/1950 Today's Date: 09/21/2013    History of Present Illness LEFT FEMORAL REVISION -Left.. Pt with history of RAKA.    Pt did have I and D of L hip hematoma 9/2.      PT Comments    Progressing with standing.  Follow Up Recommendations  SNF     Equipment Recommendations       Recommendations for Other Services       Precautions / Restrictions Precautions Precautions: Fall Precaution Comments: post precautions, R AKA without prosthesis    Mobility  Bed Mobility Overal bed mobility: Independent         Sit to supine: Mod assist   General bed mobility comments: much better bed mobility with decreased c/o L hip pain. cues  for Posterior hip precautions.   Transfers   Equipment used: Rolling walker (2 wheeled) Transfers: Sit to/from Stand Sit to Stand: +2 safety/equipment;Mod assist Stand pivot transfers: +2 safety/equipment;Mod assist;From elevated surface       General transfer comment: from recliner, from [facility]. able to hop to pivot to [facility]. stood and be brought up.  Ambulation/Gait                 Stairs            Wheelchair Mobility    Modified Rankin (Stroke Patients Only)       Balance                                    Cognition Arousal/Alertness: Awake/alert                          Exercises      General Comments        Pertinent Vitals/Pain Pain Score: 5  Pain Location: L hip standing Pain Descriptors / Indicators: Aching Pain Intervention(s): Monitored during session    Home Living                      Prior Function            PT Goals (current goals can now be found in the care plan section) Progress towards PT goals: Progressing toward goals    Frequency  Min 5X/week    PT Plan Current plan remains appropriate    Co-evaluation             End of Session   Activity  Tolerance: Patient tolerated treatment well Patient left: in bed;with call bell/phone within reach     Time: 1118-1129 PT Time Calculation (min): 11 min  Charges:  $Therapeutic Activity: 8-22 mins                    G Codes:      Claretha Cooper 09/21/2013, 6:36 PM

## 2013-10-01 ENCOUNTER — Encounter (HOSPITAL_COMMUNITY): Payer: Self-pay | Admitting: Critical Care Medicine

## 2013-10-01 ENCOUNTER — Emergency Department (HOSPITAL_COMMUNITY): Payer: BC Managed Care – PPO

## 2013-10-01 ENCOUNTER — Encounter (HOSPITAL_COMMUNITY)
Admission: EM | Disposition: A | Discharge status: Skilled Nursing Facility | Payer: Self-pay | Source: Home / Self Care | Attending: Orthopedic Surgery

## 2013-10-01 ENCOUNTER — Encounter (HOSPITAL_COMMUNITY): Payer: BC Managed Care – PPO | Admitting: Certified Registered"

## 2013-10-01 ENCOUNTER — Inpatient Hospital Stay (HOSPITAL_COMMUNITY)
Admission: EM | Admit: 2013-10-01 | Discharge: 2013-10-08 | DRG: 467 | Disposition: A | Discharge status: Skilled Nursing Facility | Payer: BC Managed Care – PPO | Attending: Orthopedic Surgery | Admitting: Orthopedic Surgery

## 2013-10-01 ENCOUNTER — Emergency Department (HOSPITAL_COMMUNITY): Payer: BC Managed Care – PPO | Admitting: Certified Registered"

## 2013-10-01 DIAGNOSIS — D62 Acute posthemorrhagic anemia: Secondary | ICD-10-CM | POA: Diagnosis not present

## 2013-10-01 DIAGNOSIS — Z8619 Personal history of other infectious and parasitic diseases: Secondary | ICD-10-CM | POA: Diagnosis not present

## 2013-10-01 DIAGNOSIS — Z96649 Presence of unspecified artificial hip joint: Secondary | ICD-10-CM | POA: Diagnosis not present

## 2013-10-01 DIAGNOSIS — S73006A Unspecified dislocation of unspecified hip, initial encounter: Secondary | ICD-10-CM | POA: Diagnosis present

## 2013-10-01 DIAGNOSIS — S78119A Complete traumatic amputation at level between unspecified hip and knee, initial encounter: Secondary | ICD-10-CM | POA: Diagnosis not present

## 2013-10-01 DIAGNOSIS — Z8249 Family history of ischemic heart disease and other diseases of the circulatory system: Secondary | ICD-10-CM

## 2013-10-01 DIAGNOSIS — Z887 Allergy status to serum and vaccine status: Secondary | ICD-10-CM

## 2013-10-01 DIAGNOSIS — Z23 Encounter for immunization: Secondary | ICD-10-CM

## 2013-10-01 DIAGNOSIS — Z9660 Presence of unspecified orthopedic joint implant: Secondary | ICD-10-CM | POA: Diagnosis present

## 2013-10-01 DIAGNOSIS — Z7901 Long term (current) use of anticoagulants: Secondary | ICD-10-CM

## 2013-10-01 DIAGNOSIS — Z79899 Other long term (current) drug therapy: Secondary | ICD-10-CM

## 2013-10-01 DIAGNOSIS — Y831 Surgical operation with implant of artificial internal device as the cause of abnormal reaction of the patient, or of later complication, without mention of misadventure at the time of the procedure: Secondary | ICD-10-CM | POA: Diagnosis present

## 2013-10-01 DIAGNOSIS — Z881 Allergy status to other antibiotic agents status: Secondary | ICD-10-CM | POA: Diagnosis not present

## 2013-10-01 DIAGNOSIS — G4730 Sleep apnea, unspecified: Secondary | ICD-10-CM | POA: Diagnosis present

## 2013-10-01 DIAGNOSIS — T84049A Periprosthetic fracture around unspecified internal prosthetic joint, initial encounter: Secondary | ICD-10-CM | POA: Diagnosis present

## 2013-10-01 DIAGNOSIS — M25559 Pain in unspecified hip: Secondary | ICD-10-CM | POA: Diagnosis present

## 2013-10-01 DIAGNOSIS — Z91041 Radiographic dye allergy status: Secondary | ICD-10-CM

## 2013-10-01 DIAGNOSIS — J45909 Unspecified asthma, uncomplicated: Secondary | ICD-10-CM | POA: Diagnosis present

## 2013-10-01 DIAGNOSIS — I10 Essential (primary) hypertension: Secondary | ICD-10-CM | POA: Diagnosis present

## 2013-10-01 DIAGNOSIS — T84029A Dislocation of unspecified internal joint prosthesis, initial encounter: Secondary | ICD-10-CM | POA: Diagnosis present

## 2013-10-01 DIAGNOSIS — Z8701 Personal history of pneumonia (recurrent): Secondary | ICD-10-CM

## 2013-10-01 DIAGNOSIS — Z87891 Personal history of nicotine dependence: Secondary | ICD-10-CM

## 2013-10-01 DIAGNOSIS — Z880 Allergy status to penicillin: Secondary | ICD-10-CM

## 2013-10-01 DIAGNOSIS — K219 Gastro-esophageal reflux disease without esophagitis: Secondary | ICD-10-CM | POA: Diagnosis present

## 2013-10-01 DIAGNOSIS — T84031A Mechanical loosening of internal left hip prosthetic joint, initial encounter: Secondary | ICD-10-CM

## 2013-10-01 DIAGNOSIS — Z9289 Personal history of other medical treatment: Secondary | ICD-10-CM

## 2013-10-01 DIAGNOSIS — IMO0001 Reserved for inherently not codable concepts without codable children: Secondary | ICD-10-CM | POA: Diagnosis present

## 2013-10-01 HISTORY — PX: HIP CLOSED REDUCTION: SHX983

## 2013-10-01 SURGERY — CLOSED REDUCTION, HIP
Anesthesia: General | Site: Hip | Laterality: Left

## 2013-10-01 MED ORDER — SENNA 8.6 MG PO TABS
1.0000 | ORAL_TABLET | Freq: Two times a day (BID) | ORAL | Status: DC
  Administered 2013-10-01 – 2013-10-08 (×13): 8.6 mg via ORAL

## 2013-10-01 MED ORDER — FLUOCINOLONE ACETONIDE 0.01 % EX SOLN
2.0000 [drp] | Freq: Two times a day (BID) | CUTANEOUS | Status: DC | PRN
  Filled 2013-10-01: qty 1

## 2013-10-01 MED ORDER — HYDROMORPHONE HCL 1 MG/ML IJ SOLN
INTRAMUSCULAR | Status: AC
  Administered 2013-10-01: 0.25 mg via INTRAVENOUS
  Filled 2013-10-01: qty 1

## 2013-10-01 MED ORDER — BUSPIRONE HCL 10 MG PO TABS
10.0000 mg | ORAL_TABLET | Freq: Two times a day (BID) | ORAL | Status: DC
  Administered 2013-10-01 – 2013-10-08 (×14): 10 mg via ORAL
  Filled 2013-10-01 (×15): qty 1

## 2013-10-01 MED ORDER — HYDROMORPHONE HCL 1 MG/ML IJ SOLN
0.2500 mg | INTRAMUSCULAR | Status: DC | PRN
  Administered 2013-10-01 (×2): 0.25 mg via INTRAVENOUS
  Administered 2013-10-01: 0.5 mg via INTRAVENOUS

## 2013-10-01 MED ORDER — PROPOFOL 10 MG/ML IV BOLUS
INTRAVENOUS | Status: DC | PRN
  Administered 2013-10-01: 120 mg via INTRAVENOUS

## 2013-10-01 MED ORDER — DIPHENHYDRAMINE HCL 25 MG PO CAPS
25.0000 mg | ORAL_CAPSULE | Freq: Four times a day (QID) | ORAL | Status: DC | PRN
  Administered 2013-10-04: 25 mg via ORAL
  Filled 2013-10-01 (×2): qty 1

## 2013-10-01 MED ORDER — MORPHINE SULFATE 2 MG/ML IJ SOLN: 0.5000 mg | INTRAMUSCULAR | Status: DC | PRN

## 2013-10-01 MED ORDER — PHENOL 1.4 % MT LIQD
1.0000 | OROMUCOSAL | Status: DC | PRN
  Filled 2013-10-01: qty 177

## 2013-10-01 MED ORDER — DEXAMETHASONE SODIUM PHOSPHATE 4 MG/ML IJ SOLN
INTRAMUSCULAR | Status: DC | PRN
  Administered 2013-10-01: 4 mg via INTRAVENOUS

## 2013-10-01 MED ORDER — METHOCARBAMOL 1000 MG/10ML IJ SOLN
500.0000 mg | Freq: Four times a day (QID) | INTRAMUSCULAR | Status: DC | PRN
  Filled 2013-10-01: qty 5

## 2013-10-01 MED ORDER — PANTOPRAZOLE SODIUM 40 MG PO TBEC
40.0000 mg | DELAYED_RELEASE_TABLET | Freq: Two times a day (BID) | ORAL | Status: DC
  Administered 2013-10-02 – 2013-10-03 (×4): 40 mg via ORAL
  Filled 2013-10-01 (×6): qty 1

## 2013-10-01 MED ORDER — DEXAMETHASONE SODIUM PHOSPHATE 4 MG/ML IJ SOLN
INTRAMUSCULAR | Status: AC
  Filled 2013-10-01: qty 1

## 2013-10-01 MED ORDER — HYDROCODONE-ACETAMINOPHEN 5-325 MG PO TABS
ORAL_TABLET | ORAL | Status: AC
  Administered 2013-10-01: 2 via ORAL
  Filled 2013-10-01: qty 2

## 2013-10-01 MED ORDER — ONDANSETRON HCL 4 MG/2ML IJ SOLN
INTRAMUSCULAR | Status: AC
  Filled 2013-10-01: qty 2

## 2013-10-01 MED ORDER — ACETAMINOPHEN 325 MG PO TABS: 650.0000 mg | ORAL_TABLET | Freq: Four times a day (QID) | ORAL | Status: DC | PRN

## 2013-10-01 MED ORDER — CITALOPRAM HYDROBROMIDE 10 MG PO TABS
10.0000 mg | ORAL_TABLET | Freq: Every morning | ORAL | Status: DC
  Administered 2013-10-02 – 2013-10-08 (×7): 10 mg via ORAL
  Filled 2013-10-01 (×8): qty 1

## 2013-10-01 MED ORDER — MENTHOL 3 MG MT LOZG
1.0000 | LOZENGE | OROMUCOSAL | Status: DC | PRN
  Filled 2013-10-01: qty 9

## 2013-10-01 MED ORDER — PROPOFOL 10 MG/ML IV BOLUS
INTRAVENOUS | Status: AC
  Filled 2013-10-01: qty 20

## 2013-10-01 MED ORDER — MIDAZOLAM HCL 5 MG/5ML IJ SOLN
INTRAMUSCULAR | Status: DC | PRN
  Administered 2013-10-01: 2 mg via INTRAVENOUS

## 2013-10-01 MED ORDER — OXYBUTYNIN CHLORIDE ER 5 MG PO TB24
5.0000 mg | ORAL_TABLET | Freq: Every day | ORAL | Status: DC
  Administered 2013-10-01 – 2013-10-08 (×8): 5 mg via ORAL
  Filled 2013-10-01 (×8): qty 1

## 2013-10-01 MED ORDER — OXYCODONE HCL 5 MG/5ML PO SOLN: 5.0000 mg | Freq: Once | ORAL | Status: DC | PRN

## 2013-10-01 MED ORDER — LORATADINE 10 MG PO TABS
10.0000 mg | ORAL_TABLET | Freq: Every day | ORAL | Status: DC
  Administered 2013-10-02 – 2013-10-08 (×7): 10 mg via ORAL
  Filled 2013-10-01 (×7): qty 1

## 2013-10-01 MED ORDER — POLYETHYLENE GLYCOL 3350 17 G PO PACK
17.0000 g | PACK | Freq: Two times a day (BID) | ORAL | Status: DC
  Administered 2013-10-01 – 2013-10-08 (×12): 17 g via ORAL

## 2013-10-01 MED ORDER — LORAZEPAM 0.5 MG PO TABS: 0.5000 mg | ORAL_TABLET | Freq: Two times a day (BID) | ORAL | Status: DC | PRN

## 2013-10-01 MED ORDER — MIDAZOLAM HCL 2 MG/2ML IJ SOLN
INTRAMUSCULAR | Status: AC
  Filled 2013-10-01: qty 2

## 2013-10-01 MED ORDER — SUCCINYLCHOLINE CHLORIDE 20 MG/ML IJ SOLN
INTRAMUSCULAR | Status: DC | PRN
  Administered 2013-10-01: 100 mg via INTRAVENOUS

## 2013-10-01 MED ORDER — LACTATED RINGERS IV SOLN
INTRAVENOUS | Status: DC | PRN
  Administered 2013-10-01: 15:00:00 via INTRAVENOUS

## 2013-10-01 MED ORDER — FENTANYL CITRATE 0.05 MG/ML IJ SOLN
INTRAMUSCULAR | Status: AC
  Filled 2013-10-01: qty 5

## 2013-10-01 MED ORDER — METOCLOPRAMIDE HCL 10 MG PO TABS: 5.0000 mg | ORAL_TABLET | Freq: Three times a day (TID) | ORAL | Status: DC | PRN

## 2013-10-01 MED ORDER — SODIUM CHLORIDE 0.9 % IV SOLN
INTRAVENOUS | Status: DC
  Administered 2013-10-02: 50 mL/h via INTRAVENOUS
  Administered 2013-10-02: 02:00:00 via INTRAVENOUS

## 2013-10-01 MED ORDER — SODIUM CHLORIDE 0.9 % IV SOLN
INTRAVENOUS | Status: DC
  Administered 2013-10-04: 19:00:00 via INTRAVENOUS

## 2013-10-01 MED ORDER — BUPROPION HCL ER (SR) 150 MG PO TB12
150.0000 mg | ORAL_TABLET | Freq: Two times a day (BID) | ORAL | Status: DC
  Administered 2013-10-01 – 2013-10-08 (×13): 150 mg via ORAL
  Filled 2013-10-01 (×16): qty 1

## 2013-10-01 MED ORDER — POLYSACCHARIDE IRON COMPLEX 150 MG PO CAPS
150.0000 mg | ORAL_CAPSULE | Freq: Two times a day (BID) | ORAL | Status: DC
  Administered 2013-10-01 – 2013-10-08 (×13): 150 mg via ORAL
  Filled 2013-10-01 (×15): qty 1

## 2013-10-01 MED ORDER — TAB-A-VITE/IRON PO TABS
1.0000 | ORAL_TABLET | Freq: Every day | ORAL | Status: DC
Start: 2013-10-02 — End: 2013-10-08
  Administered 2013-10-02 – 2013-10-08 (×7): 1 via ORAL
  Filled 2013-10-01 (×7): qty 1

## 2013-10-01 MED ORDER — METHOCARBAMOL 500 MG PO TABS
500.0000 mg | ORAL_TABLET | Freq: Four times a day (QID) | ORAL | Status: DC | PRN
  Administered 2013-10-01 – 2013-10-07 (×6): 500 mg via ORAL
  Filled 2013-10-01 (×6): qty 1

## 2013-10-01 MED ORDER — ALBUTEROL SULFATE (2.5 MG/3ML) 0.083% IN NEBU: 2.5000 mg | INHALATION_SOLUTION | Freq: Four times a day (QID) | RESPIRATORY_TRACT | Status: DC | PRN

## 2013-10-01 MED ORDER — ASPIRIN EC 325 MG PO TBEC
325.0000 mg | DELAYED_RELEASE_TABLET | Freq: Every day | ORAL | Status: DC
  Administered 2013-10-01 – 2013-10-08 (×7): 325 mg via ORAL
  Filled 2013-10-01 (×8): qty 1

## 2013-10-01 MED ORDER — ONDANSETRON HCL 4 MG/2ML IJ SOLN
INTRAMUSCULAR | Status: DC | PRN
  Administered 2013-10-01: 4 mg via INTRAVENOUS

## 2013-10-01 MED ORDER — HYDROCODONE-ACETAMINOPHEN 5-325 MG PO TABS
1.0000 | ORAL_TABLET | Freq: Four times a day (QID) | ORAL | Status: DC | PRN
  Administered 2013-10-01 – 2013-10-02 (×3): 2 via ORAL
  Administered 2013-10-03: 1 via ORAL
  Administered 2013-10-03 (×2): 2 via ORAL
  Administered 2013-10-03: 1 via ORAL
  Administered 2013-10-04 (×2): 2 via ORAL
  Filled 2013-10-01: qty 10
  Filled 2013-10-01 (×6): qty 2

## 2013-10-01 MED ORDER — HYDROMORPHONE HCL 2 MG PO TABS
1.0000 mg | ORAL_TABLET | ORAL | Status: DC | PRN
  Administered 2013-10-01 – 2013-10-02 (×3): 2 mg via ORAL
  Filled 2013-10-01 (×3): qty 1

## 2013-10-01 MED ORDER — METOCLOPRAMIDE HCL 5 MG/ML IJ SOLN: 5.0000 mg | Freq: Three times a day (TID) | INTRAMUSCULAR | Status: DC | PRN

## 2013-10-01 MED ORDER — PROMETHAZINE HCL 25 MG/ML IJ SOLN: 6.2500 mg | INTRAMUSCULAR | Status: DC | PRN

## 2013-10-01 MED ORDER — ONDANSETRON HCL 4 MG PO TABS: 4.0000 mg | ORAL_TABLET | Freq: Four times a day (QID) | ORAL | Status: DC | PRN

## 2013-10-01 MED ORDER — FENTANYL CITRATE 0.05 MG/ML IJ SOLN
INTRAMUSCULAR | Status: DC | PRN
  Administered 2013-10-01: 50 ug via INTRAVENOUS

## 2013-10-01 MED ORDER — SODIUM CHLORIDE 0.9 % IV SOLN
1.0000 g | INTRAVENOUS | Status: DC
  Administered 2013-10-01 – 2013-10-07 (×7): 1 g via INTRAVENOUS
  Filled 2013-10-01 (×8): qty 1

## 2013-10-01 MED ORDER — DOXEPIN HCL 10 MG PO CAPS
10.0000 mg | ORAL_CAPSULE | Freq: Every day | ORAL | Status: DC
  Administered 2013-10-01 – 2013-10-07 (×7): 10 mg via ORAL
  Filled 2013-10-01 (×8): qty 1

## 2013-10-01 MED ORDER — OXYCODONE HCL 5 MG PO TABS: 5.0000 mg | ORAL_TABLET | Freq: Once | ORAL | Status: DC | PRN

## 2013-10-01 MED ORDER — ACETAMINOPHEN 650 MG RE SUPP: 650.0000 mg | Freq: Four times a day (QID) | RECTAL | Status: DC | PRN

## 2013-10-01 MED ORDER — METHOCARBAMOL 500 MG PO TABS
ORAL_TABLET | ORAL | Status: AC
  Administered 2013-10-01: 500 mg via ORAL
  Filled 2013-10-01: qty 1

## 2013-10-01 MED ORDER — ACETAMINOPHEN 325 MG PO TABS
650.0000 mg | ORAL_TABLET | Freq: Four times a day (QID) | ORAL | Status: DC | PRN
  Administered 2013-10-01 – 2013-10-02 (×2): 650 mg via ORAL
  Filled 2013-10-01 (×2): qty 2

## 2013-10-01 MED ORDER — CYCLOBENZAPRINE HCL 10 MG PO TABS
10.0000 mg | ORAL_TABLET | Freq: Two times a day (BID) | ORAL | Status: DC
  Administered 2013-10-01 – 2013-10-08 (×14): 10 mg via ORAL
  Filled 2013-10-01 (×15): qty 1

## 2013-10-01 MED ORDER — ONDANSETRON HCL 4 MG/2ML IJ SOLN: 4.0000 mg | Freq: Four times a day (QID) | INTRAMUSCULAR | Status: DC | PRN

## 2013-10-01 SURGICAL SUPPLY — 4 items
DRSG PAD ABDOMINAL 8X10 ST (GAUZE/BANDAGES/DRESSINGS) ×3 IMPLANT
GAUZE SPONGE 4X4 12PLY STRL (GAUZE/BANDAGES/DRESSINGS) ×3 IMPLANT
PAD ABD 8X10 STRL (GAUZE/BANDAGES/DRESSINGS) ×3 IMPLANT
TAPE CLOTH SURG 6X10 WHT LF (GAUZE/BANDAGES/DRESSINGS) ×3 IMPLANT

## 2013-10-01 NOTE — H&P (View-Only) (Signed)
[name] is an 63 y.o. female.   Chief Complaint: Left hip pain for 4 days HPI: HPI: Patient presents with Left hip pain. She is a Patient of Dr. Wynelle Link how did a arthroplasty on July 8th and most recently due to a periprosthetic fracture required a revision 2 weeks ago. She has been at the [place] [facility] for rehab since.This past Thursday, she rolled on her side internally rotaing her leg after using the bedpan when she felt a pop and her leg collapsed. Minimal pain at the time , but increased over the weekend. Today due to her pain and edema a x ray was ordered and found her hip was dislocated. She was then brought to the [facility] via EMS for further evaluation. LAbs and imaging obtained which confirmed the diagnosis. Denies SOB, CP, or numbness/ tingling in LLE. 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  Past Medical History  Diagnosis Date  . Complication of anesthesia     NAUSEA  . Chest pain     pt states due to esophageal problem -  primary care MD aware   . Hives     since pneumonia vaccine   . Asthma   . Depression   . Anxiety   . Fibromyalgia   . Spondylosis   . Bulging disc   . 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 Phantom limb pain     rt leg  . Leg pain, left   . Eczema     ears  . GERD (gastroesophageal reflux disease)     " alot of burping"  . Constipation, chronic   . Urgency incontinence   . History of transfusion   . Cancer     hx sarcoma rt knee with amputation  . Difficulty sleeping   . Sleep apnea     pt had recent sleep study done but does not know results - report called for and put on chart showing significant obstructive sleep apnea  . Hypertension   . Back pain 08/19/13    low back and  left leg pain due to nerve problems per pt  . PONV (postoperative nausea and vomiting)   . Peripheral neuropathy   . History of bronchitis   . History of pneumonia   . Hiatal hernia   . History of scarlet fever   . Postmenopausal   . History of  measles   . History of mumps   . History of rubella     Past Surgical History  Procedure Laterality Date  . Abdominal hysterectomy  1980'S  . Sarcoma  1973    RT KNEE (aka)  . External ear surgery  AGE 79  . 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 Orif periprosthetic fracture Left 08/21/2013    Procedure: OPEN REDUCTION INTERNAL FIXATION (ORIF) PERIPROSTHETIC FRACTURE WITH FEMORAL REVISION, LEFT FEMUR;  Surgeon: [name], MD;  Location: [facility] ORS;  Service: Orthopedics;  Laterality: Left;  . Back surgery      Family History  Problem Relation Age of Onset  . CAD Sister     CABG, stents  . CAD Father    Social History:  reports that she quit smoking about 6 years ago. Her smoking use included Cigarettes. She smoked 0.00 packs per day. She has  never used smokeless tobacco. She reports that she does not drink alcohol or use illicit drugs.  Allergies:  Allergies  Allergen Reactions  . Bactrim [Sulfamethoxazole-Tmp Ds] Shortness Of Breath  . Cefuroxime Shortness Of Breath  . Omnipaque [Iohexol] Shortness Of Breath    Patient stated that she had a reaction a few years ago to the IV dye and had trouble breathing--so for her CT Angio Chest today (07/12/13), ER MD gave her solumedrol and Benadryl before she had test and did very well with that.  ak 07/12/13  . Pneumococcal Vaccines Hives    Hives over entire body after vaccine  . Penicillins Diarrhea  . Sulfa Antibiotics Other (See Comments)    unknown    Medications Prior to Admission  Medication Sig Dispense Refill  . acetaminophen (TYLENOL) 325 MG tablet Take 2 tablets (650 mg total) by mouth every 6 (six) hours as needed for mild pain (or Fever >/= 101).  40 tablet  0  . albuterol (PROVENTIL HFA;VENTOLIN HFA) 108 (90 BASE) MCG/ACT inhaler Inhale 2 puffs into the lungs every 6  (six) hours as needed for wheezing or shortness of breath.      Marland Kitchen buPROPion (WELLBUTRIN SR) 150 MG 12 hr tablet Take 150 mg by mouth 2 (two) times daily.      . busPIRone (BUSPAR) 10 MG tablet Take 10 mg by mouth 2 (two) times daily.      . Calcium-Vitamin D (CALTRATE 600 PLUS-VIT D PO) Take 1 tablet by mouth 2 (two) times daily.      . citalopram (CELEXA) 10 MG tablet Take 10 mg by mouth every morning.      . cyclobenzaprine (FLEXERIL) 10 MG tablet Take 1 tablet (10 mg total) by mouth 2 (two) times daily.  90 tablet  0  . diphenhydrAMINE (BENADRYL) 25 MG tablet Take 1 tablet (25 mg total) by mouth every 6 (six) hours as needed for itching.  30 tablet  0  . doxepin (SINEQUAN) 10 MG capsule Take 10 mg by mouth at bedtime.      Marland Kitchen esomeprazole (NEXIUM) 20 MG capsule Take 1 capsule (20 mg total) by mouth 2 (two) times daily before a meal.  60 capsule  0  . fexofenadine (ALLEGRA) 180 MG tablet Take 180 mg by mouth daily.      . fluocinolone (SYNALAR) 0.01 % external solution Place 2 drops into both ears 2 (two) times daily as needed (itching).      Marland Kitchen HYDROmorphone (DILAUDID) 2 MG tablet Take 0.5-1 tablets (1-2 mg total) by mouth every 3 (three) hours as needed for moderate pain or severe pain.  90 tablet  0  . irbesartan (AVAPRO) 150 MG tablet Take 150 mg by mouth 2 (two) times daily.      . iron polysaccharides (NIFEREX) 150 MG capsule Take 1 capsule (150 mg total) by mouth 2 (two) times daily.  60 capsule  1  . LORazepam (ATIVAN) 0.5 MG tablet Take 1 tablet (0.5 mg total) by mouth 2 (two) times daily as needed for anxiety.  30 tablet  0  . metoCLOPramide (REGLAN) 5 MG tablet Take 1-2 tablets (5-10 mg total) by mouth every 8 (eight) hours as needed for nausea (if ondansetron (ZOFRAN) ineffective.).  40 tablet  0  . Multiple Vitamins-Iron (MULTIVITAMINS WITH IRON) TABS tablet Take 1 tablet by mouth daily.      . ondansetron (ZOFRAN) 4 MG tablet Take 1 tablet (4 mg total) by mouth every 6 (six) hours as  needed for nausea.  40 tablet  0  . oxybutynin (DITROPAN-XL) 5 MG 24 hr tablet Take 5 mg by mouth daily.      . polyethylene glycol (MIRALAX / GLYCOLAX) packet Take 17 g by mouth 2 (two) times daily.  14 each  0  . rivaroxaban (XARELTO) 10 MG TABS tablet Take 1 tablet (10 mg total) by mouth daily with breakfast. Take Xarelto for two and a half more weeks, then discontinue Xarelto. Once the patient has completed the Xarelto, they may resume the 81 mg Aspirin.  16 tablet  0  . senna (SENOKOT) 8.6 MG TABS tablet Take 1 tablet by mouth 2 (two) times daily.        Results for orders placed during the hospital encounter of 09/06/13 (from the past 48 hour(s))  CBC WITH DIFFERENTIAL     Status: Abnormal   Collection Time    09/06/13  6:44 PM      Result Value Ref Range   WBC 9.8  4.0 - 10.5 K/uL   RBC 3.60 (*) 3.87 - 5.11 MIL/uL   Hemoglobin 10.6 (*) 12.0 - 15.0 g/dL   HCT 33.0 (*) 36.0 - 46.0 %   MCV 91.7  78.0 - 100.0 fL   MCH 29.4  26.0 - 34.0 pg   MCHC 32.1  30.0 - 36.0 g/dL   RDW 14.0  11.5 - 15.5 %   Platelets 565 (*) 150 - 400 K/uL   Neutrophils Relative % 56  43 - 77 %   Neutro Abs 5.6  1.7 - 7.7 K/uL   Lymphocytes Relative 29  12 - 46 %   Lymphs Abs 2.8  0.7 - 4.0 K/uL   Monocytes Relative 11  3 - 12 %   Monocytes Absolute 1.0  0.1 - 1.0 K/uL   Eosinophils Relative 4  0 - 5 %   Eosinophils Absolute 0.4  0.0 - 0.7 K/uL   Basophils Relative 0  0 - 1 %   Basophils Absolute 0.0  0.0 - 0.1 K/uL  BASIC METABOLIC PANEL     Status: Abnormal   Collection Time    09/06/13  6:44 PM      Result Value Ref Range   Sodium 140  137 - 147 mEq/L   Potassium 4.1  3.7 - 5.3 mEq/L   Chloride 103  96 - 112 mEq/L   CO2 25  19 - 32 mEq/L   Glucose, Bld 136 (*) 70 - 99 mg/dL   BUN 15  6 - 23 mg/dL   Creatinine, Ser 0.70  0.50 - 1.10 mg/dL   Calcium 8.9  8.4 - 10.5 mg/dL   GFR calc non Af Amer >90  >90 mL/min   GFR calc Af Amer >90  >90 mL/min   Comment: (NOTE)     The eGFR has been calculated  using the CKD EPI equation.     This calculation has not been validated in all clinical situations.     eGFR's persistently <90 mL/min signify possible Chronic Kidney     Disease.   Anion gap 12  5 - 15  PROTIME-INR     Status: Abnormal   Collection Time    09/06/13  6:44 PM      Result Value Ref Range   Prothrombin Time 16.0 (*) 11.6 - 15.2 seconds   INR 1.28  0.00 - 1.49  TYPE AND SCREEN     Status: None   Collection Time    09/06/13  6:44  PM      Result Value Ref Range   ABO/RH(D) O POS     Antibody Screen NEG     Sample Expiration 09/09/2013     Dg Hip Complete Left  09/06/2013   CLINICAL DATA:  Left leg pain  EXAM: LEFT HIP - COMPLETE 2+ VIEW  COMPARISON:  None.  FINDINGS: There is a left total hip arthroplasty. There is superior dislocation of the left femoral head component relative to the acetabular component. There is no acute fracture.  There is generalized osteopenia.  IMPRESSION: Left total hip arthroplasty with superior dislocation of the left femoral head component relative to the acetabulum.   Electronically Signed   By: Kathreen Devoid   On: 09/06/2013 18:25   Dg Femur Left  09/06/2013   CLINICAL DATA:  Increasing LEFT leg pain on Thursday, post hip replacement in July, hip injury  EXAM: LEFT FEMUR - 2 VIEW  COMPARISON:  Preoperative exam 07/12/2013  FINDINGS: Osseous demineralization.  LEFT hip prosthesis identified with superior dislocation of the femoral component.  Cerclage wires and lateral plate at the proximal LEFT femur appear intact.  No periprosthetic lucency or fracture identified.  Visualized pelvis intact.  Knee joint spaces preserved.  IMPRESSION: Dislocated LEFT hip prosthesis.  Osseous demineralization.   Electronically Signed   By: Lavonia Dana M.D.   On: 09/06/2013 18:24    Review of Systems  Constitutional: Negative.   HENT: Negative.   Eyes: Negative.   Respiratory: Negative.   Cardiovascular: Negative.   Gastrointestinal: Negative.   Genitourinary:  Negative.   Musculoskeletal: Positive for joint pain.  Skin: Negative.   Neurological: Negative.   Endo/Heme/Allergies: Negative.   Psychiatric/Behavioral: Positive for depression. The patient is nervous/anxious.     Blood pressure 111/54, pulse 77, temperature 98.3 F (36.8 C), temperature source Oral, resp. rate 14, SpO2 99.00%. Physical Exam  Constitutional: She is oriented to person, place, and time. She appears well-developed.  HENT:  Head: Normocephalic.  Eyes: EOM are normal.  Neck: Normal range of motion.  Cardiovascular: Normal rate.   Respiratory: Effort normal.  GI: Soft.  Genitourinary:  Deferred  Musculoskeletal: She exhibits edema and tenderness.  Right AKA. Left hip edema and tenderness. Anterior incision and posterior incisions. Dressing C/D/I on posterior.erthyema noted and warm to the touch. Anterior WNL. RLE N/V intact. Sciatic intact. Foot is rotated slightly. Patient can plantar and dorsi flex well. Knee is benign. No Compartment syndrome indications.   Neurological: She is alert and oriented to person, place, and time.  Skin: Skin is warm and dry.  Psychiatric: Her behavior is normal.     Assessment/Plan: Left hip arthroplasty dislocation with history of periprosthetic fracture revision Admitted for bedrest and pre-op Dr. Wynelle Link to see in am Plan for revision Wednesday Hold Xarelto Regular diet SCD to LLE    STILWELL, BRYSON L 09/06/2013, 8:50 PM

## 2013-10-01 NOTE — Transfer of Care (Signed)
Immediate Anesthesia Transfer of Care Note  Patient: [name]  Procedure(s) Performed: Procedure(s): Attempted CLOSED REDUCTION HIP and dressing change  (Left)  Patient Location: PACU  Anesthesia Type:General  Level of Consciousness: sedated  Airway & Oxygen Therapy: Patient Spontanous Breathing and Patient connected to nasal cannula oxygen  Post-op Assessment: Report given to PACU RN, Post -op Vital signs reviewed and stable and Patient moving all extremities X 4  Post vital signs: Reviewed and stable  Complications: none

## 2013-10-01 NOTE — Anesthesia Procedure Notes (Signed)
Procedure Name: Intubation Date/Time: 10/01/2013 2:51 PM Performed by: Carola Frost Pre-anesthesia Checklist: Timeout performed, Emergency Drugs available, Patient identified, Suction available and Patient being monitored Patient Re-evaluated:Patient Re-evaluated prior to inductionOxygen Delivery Method: Circle system utilized Preoxygenation: Pre-oxygenation with 100% oxygen Intubation Type: IV induction and Rapid sequence Laryngoscope Size: 3 and Mac Grade View: Grade II Tube size: 7.5 mm Number of attempts: 1 Airway Equipment and Method: Stylet Placement Confirmation: CO2 detector,  positive ETCO2,  ETT inserted through vocal cords under direct vision and breath sounds checked- equal and bilateral Secured at: 21 cm Tube secured with: Tape Dental Injury: Teeth and Oropharynx as per pre-operative assessment

## 2013-10-01 NOTE — Brief Op Note (Signed)
10/01/2013  3:42 PM  PATIENT:  [name]  63 y.o. female  PRE-OPERATIVE DIAGNOSIS:  Left hip dislocation  POST-OPERATIVE DIAGNOSIS:  Left hip dislocation  PROCEDURE:  Procedure(s): Attempted CLOSED REDUCTION HIP and dressing change  (Left)  SURGEON:  Surgeon(s) and Role:    * Augustin Schooling, MD - Primary  PHYSICIAN ASSISTANT:   ASSISTANTS: none   ANESTHESIA:   general  EBL:     BLOOD ADMINISTERED:none  DRAINS: none   LOCAL MEDICATIONS USED:  NONE  SPECIMEN:  No Specimen  DISPOSITION OF SPECIMEN:  N/A  COUNTS:  YES  TOURNIQUET:  * No tourniquets in log *  DICTATION: .Other Dictation: Dictation Number [id number]  PLAN OF CARE: Admit to inpatient   PATIENT DISPOSITION:  PACU - hemodynamically stable.   Delay start of Pharmacological VTE agent (>24hrs) due to surgical blood loss or risk of bleeding: not applicable

## 2013-10-01 NOTE — Interval H&P Note (Signed)
History and Physical Interval Note:  10/01/2013 1:48 PM  [name]  has presented today for surgery, with the diagnosis of Left hip dislocation  The various methods of treatment have been discussed with the patient and family. After consideration of risks, benefits and other options for treatment, the patient has consented to  Procedure(s): CLOSED REDUCTION HIP (Left) as a surgical intervention .  The patient's history has been reviewed, patient examined, no change in status, stable for surgery.  I have reviewed the patient's chart and labs.  Questions were answered to the patient's satisfaction.     Cinda Hara,STEVEN R

## 2013-10-01 NOTE — Anesthesia Preprocedure Evaluation (Addendum)
Anesthesia Evaluation  Patient identified by MRN, date of birth, ID band Patient awake    Reviewed: Allergy & Precautions, H&P , NPO status , Patient's Chart, lab work & pertinent test results  History of Anesthesia Complications (+) PONV  Airway Mallampati: III TM Distance: >3 FB Neck ROM: Full    Dental  (+) Teeth Intact, Dental Advisory Given   Pulmonary asthma , sleep apnea , former smoker,  breath sounds clear to auscultation        Cardiovascular hypertension, Rhythm:Regular Rate:Normal  07/2013 NM stress test: No evidence of pharmacologic induced myocardial ischemia.Normal left ventricular wall motion study, with calculated ejection fraction of 80%.  6/15 TTE: Normal biventricular function. Normal valves.   Neuro/Psych PSYCHIATRIC DISORDERS Anxiety Depression  Neuromuscular disease    GI/Hepatic Neg liver ROS, hiatal hernia, GERD-  ,  Endo/Other  negative endocrine ROS  Renal/GU negative Renal ROS     Musculoskeletal  (+) Fibromyalgia -  Abdominal   Peds  Hematology  (+) anemia ,   Anesthesia Other Findings   Reproductive/Obstetrics negative OB ROS                          Anesthesia Physical  Anesthesia Plan  ASA: III  Anesthesia Plan: General   Post-op Pain Management:    Induction: Intravenous  Airway Management Planned: Mask  Additional Equipment: None  Intra-op Plan:   Post-operative Plan:   Informed Consent: I have reviewed the patients History and Physical, chart, labs and discussed the procedure including the risks, benefits and alternatives for the proposed anesthesia with the patient or authorized representative who has indicated his/her understanding and acceptance.   Dental advisory given  Plan Discussed with: CRNA and Surgeon  Anesthesia Plan Comments:         Anesthesia Quick Evaluation

## 2013-10-01 NOTE — Progress Notes (Signed)
Report was called to [place], RN ([facility] [place]). Pt transported via [facility].

## 2013-10-01 NOTE — Anesthesia Postprocedure Evaluation (Signed)
  Anesthesia Post-op Note  Patient: [name]  Procedure(s) Performed: Procedure(s): Attempted CLOSED REDUCTION HIP and dressing change  (Left)  Patient Location: PACU  Anesthesia Type:General  Level of Consciousness: awake, alert  and oriented  Airway and Oxygen Therapy: Patient Spontanous Breathing  Post-op Pain: none  Post-op Assessment: Post-op Vital signs reviewed  Post-op Vital Signs: Reviewed  Last Vitals:  Filed Vitals:   10/01/13 1545  BP: 158/73  Pulse: 88  Temp:   Resp: 10    Complications: No apparent anesthesia complications

## 2013-10-02 LAB — BASIC METABOLIC PANEL
ANION GAP: 10 (ref 5–15)
BUN: 12 mg/dL (ref 6–23)
CHLORIDE: 106 meq/L (ref 96–112)
CO2: 28 meq/L (ref 19–32)
Calcium: 8.8 mg/dL (ref 8.4–10.5)
Creatinine, Ser: 0.56 mg/dL (ref 0.50–1.10)
GFR calc Af Amer: >90 mL/min (ref >90)
GFR calc non Af Amer: >90 mL/min (ref >90)
GLUCOSE: 99 mg/dL (ref 70–99)
POTASSIUM: 3.9 meq/L (ref 3.7–5.3)
SODIUM: 144 meq/L (ref 137–147)

## 2013-10-02 LAB — CBC
HCT: 29.4 % — ABNORMAL LOW (ref 36.0–46.0)
Hemoglobin: 9.2 g/dL — ABNORMAL LOW (ref 12.0–15.0)
MCH: 29.1 pg (ref 26.0–34.0)
MCHC: 31.3 g/dL (ref 30.0–36.0)
MCV: 93 fL (ref 78.0–100.0)
PLATELETS: 454 10*3/uL — AB (ref 150–400)
RBC: 3.16 MIL/uL — ABNORMAL LOW (ref 3.87–5.11)
RDW: 14.2 % (ref 11.5–15.5)
WBC: 10 10*3/uL (ref 4.0–10.5)

## 2013-10-02 NOTE — Op Note (Signed)
[name], [name] NO.:  [id number]  MEDICAL RECORD NO.:  [id number]  LOCATION:  [place]:  [facility]  PHYSICIAN:  Clever Veverly Fells, M.D. DATE OF BIRTH:  1950/07/21  DATE OF PROCEDURE:  10/01/2013 DATE OF DISCHARGE:                              OPERATIVE REPORT   PREOPERATIVE DIAGNOSIS:  Left dislocation of total hip replacement.  POSTOP DIAGNOSIS:  Left dislocation of total hip replacement.  PROCEDURE PERFORMED:  Attempted left closed hip reduction with dressing changes to the left hip.  ATTENDING SURGEON:  Doran Heater. Veverly Fells, M.D.  ASSISTANTS:  None.  ANESTHESIA:  General anesthesia was used.  ESTIMATED BLOOD LOSS:  None.  FLUID REPLACEMENT:  500 mL crystalloid.  INSTRUMENT COUNTS:  Correct.  COMPLICATIONS:  No complications.  INDICATIONS:  The patient is a 63 year old female status post a left hip replacement followed by a periprosthetic fracture requiring open reduction and internal fixation, and stent exchange.  The patient also had a loosening of that stem and subsidence requiring a third operation. She presents for dislocated hip that occurred during therapy today.  The patient was referred over by my partner, Dr. Gaynelle Arabian for closed reduction.  Discussed this with the patient's family.  Risks and benefits were discussed.  They would like to proceed with attempted closed reduction.  If we were not successful with closed reduction, the patient is to be admitted at [facility] for surgery next week.  Risks and benefits were discussed.  Informed consent obtained.  DESCRIPTION OF PROCEDURE:  After adequate level of general anesthesia and muscle relaxant was given, the patient had numerous attempts at closed reduction of the hip involving assistance with holding the pelvis down with a combination traction and internal, external rotation.  Also manual pressure from the posterior aspect of the femur.  We had x-ray  in the room indicating that the hip was subluxed laterally and posteriorly. Despite all attempts and complete muscle relaxation, we were not able to reduce the hip after 30 minutes of effort.  At this point, we discontinued attempted closed reduction.  The patient was awakened and taken to recovery in stable condition.  I notified Dr. Wynelle Link, the attending of record regarding this and he will be taking over her care again next week.  We will admit her at [facility] for continued supportive care.     Doran Heater. Veverly Fells, M.D.     SRN/MEDQ  D:  10/01/2013  T:  10/01/2013  Job:  [id number]

## 2013-10-02 NOTE — Clinical Social Work Note (Signed)
CSW familiar with patient from previous hospitalization- prior to this admission, she has been at [facility] SNF, She has pleased with the care and  rehabilitation and hopes to return there at d/c.  Full assessment to follow-    Eduard Clos, MSW, [place] [phone]/weekend coverage

## 2013-10-03 LAB — CBC
HCT: 27.2 % — ABNORMAL LOW (ref 36.0–46.0)
HEMOGLOBIN: 8.6 g/dL — AB (ref 12.0–15.0)
MCH: 29.3 pg (ref 26.0–34.0)
MCHC: 31.6 g/dL (ref 30.0–36.0)
MCV: 92.5 fL (ref 78.0–100.0)
PLATELETS: 399 10*3/uL (ref 150–400)
RBC: 2.94 MIL/uL — ABNORMAL LOW (ref 3.87–5.11)
RDW: 14.6 % (ref 11.5–15.5)
WBC: 6.4 10*3/uL (ref 4.0–10.5)

## 2013-10-03 LAB — BASIC METABOLIC PANEL
Anion gap: 9 (ref 5–15)
BUN: 14 mg/dL (ref 6–23)
CALCIUM: 8.5 mg/dL (ref 8.4–10.5)
CO2: 28 mEq/L (ref 19–32)
Chloride: 104 mEq/L (ref 96–112)
Creatinine, Ser: 0.63 mg/dL (ref 0.50–1.10)
GLUCOSE: 112 mg/dL — AB (ref 70–99)
POTASSIUM: 3.7 meq/L (ref 3.7–5.3)
SODIUM: 141 meq/L (ref 137–147)

## 2013-10-03 MED ORDER — SODIUM CHLORIDE 0.9 % IJ SOLN
10.0000 mL | INTRAMUSCULAR | Status: DC | PRN
  Administered 2013-10-03 – 2013-10-08 (×5): 10 mL

## 2013-10-03 MED ORDER — ESOMEPRAZOLE MAGNESIUM 20 MG PO CPDR
20.0000 mg | DELAYED_RELEASE_CAPSULE | Freq: Two times a day (BID) | ORAL | Status: DC
  Administered 2013-10-04 – 2013-10-08 (×8): 20 mg via ORAL
  Filled 2013-10-03 (×11): qty 1

## 2013-10-03 MED ORDER — NON FORMULARY: 20.0000 mg | Freq: Two times a day (BID) | Status: DC

## 2013-10-03 NOTE — Plan of Care (Signed)
Problem: Phase I Progression Outcomes Goal: Incision/dressings dry and intact Outcome: Progressing Old drainage on dsg

## 2013-10-03 NOTE — Progress Notes (Signed)
Subjective: 2 Days Post-Op Procedure(s) (LRB): Attempted CLOSED REDUCTION HIP and dressing change  (Left) Patient reports pain as moderate.  Located to Left hip. Tolerating PO's well. No other complaints. Denies SOB, CP, or Calf pain.   Objective: Vital signs in last 24 hours: Temp:  [98.4 F (36.9 C)-98.7 F (37.1 C)] 98.4 F (36.9 C) (09/20 0530) Pulse Rate:  [76-87] 76 (09/20 0530) Resp:  [16-18] 17 (09/20 0530) BP: (106-120)/(46-56) 120/56 mmHg (09/20 0530) SpO2:  [96 %-98 %] 97 % (09/20 0530)  Intake/Output from previous day: 09/19 0701 - 09/20 0700 In: 1030 [P.O.:480; I.V.:500; IV Piggyback:50] Out: 800 [Urine:800] Intake/Output this shift:     Recent Labs  10/02/13 0545 10/03/13 0450  HGB 9.2* 8.6*    Recent Labs  10/02/13 0545 10/03/13 0450  WBC 10.0 6.4  RBC 3.16* 2.94*  HCT 29.4* 27.2*  PLT 454* 399    Recent Labs  10/02/13 0545 10/03/13 0450  NA 144 141  K 3.9 3.7  CL 106 104  CO2 28 28  BUN 12 14  CREATININE 0.56 0.63  GLUCOSE 99 112*  CALCIUM 8.8 8.5   No results found for this basename: LABPT, INR,  in the last 72 hours  Lying in bed. A&O x3. Left hip incisions WNL.  Left foot externally rotated. LLE n/V intact. Tenderness to Left hip. RLE amputation.   Assessment/Plan: 2 Days Post-Op Procedure(s) (LRB): Attempted CLOSED REDUCTION HIP and dressing change  (Left) Dr. Veverly Fells discussed with Dr. Wynelle Link.  NPO MN Continue current care Possible reduction tomorrow per Dr. Kandace Blitz, Shalicia Craghead L 10/03/2013, 8:35 AM

## 2013-10-03 NOTE — Plan of Care (Signed)
Problem: Phase I Progression Outcomes Goal: Incision/dressings dry and intact Outcome: Completed/Met Date Met:  10/03/13 Old drainage on dressing

## 2013-10-03 NOTE — Clinical Social Work Psychosocial (Signed)
Clinical Social Work Department BRIEF PSYCHOSOCIAL ASSESSMENT 10/03/2013  Patient:  [name], [name]     Account Number:  [id number]     Admit date:  10/01/2013  Clinical Social Worker:  Daiva Huge  Date/Time:  10/02/2013 04:00 PM  Referred by:  Physician  Date Referred:  10/02/2013 Referred for  SNF Placement   Other Referral:   Interview type:  Patient Other interview type:    PSYCHOSOCIAL DATA Living Status:  FACILITY Admitted from facility:  [facility] Level of care:  [place] Primary support name:  husband Primary support relationship to patient:  FAMILY Degree of support available:   good- husband works    CURRENT CONCERNS Current Concerns  Post-Acute Placement   Other Concerns:    SOCIAL WORK ASSESSMENT / PLAN CSW met with patient whom I am familiar with from previous hospitalization- patient somewhat "frustrated" with the reoccurring problems with her hip- "the doctor is gonna put a lock on it this time so it cant pop out".  Patient is in good spirits and optimistic this will help- she reports to CSW that she has been staying at [facility] for SNF rehab and has been pleased there- she is hoping to return there at time of d/c.   Assessment/plan status:  Other - See comment Other assessment/ plan:   update FL2   Information/referral to community resources:   [employer] auth    PATIENT'S/FAMILY'S RESPONSE TO PLAN OF CARE: Patient appreciative of CSW visit and support- she is eager to have surgery on Monday and continue with her rehabilitation to get her back home.  Support and encouragement provided- plan return to SNF pending insurance auth at d/c.       Eduard Clos, MSW, [place] weekend coverage

## 2013-10-04 ENCOUNTER — Encounter (HOSPITAL_COMMUNITY): Payer: Self-pay | Admitting: Orthopedic Surgery

## 2013-10-04 ENCOUNTER — Inpatient Hospital Stay (HOSPITAL_COMMUNITY): Payer: BC Managed Care – PPO

## 2013-10-04 ENCOUNTER — Inpatient Hospital Stay (HOSPITAL_COMMUNITY): Payer: BC Managed Care – PPO | Admitting: Registered Nurse

## 2013-10-04 ENCOUNTER — Encounter (HOSPITAL_COMMUNITY): Payer: BC Managed Care – PPO | Admitting: Registered Nurse

## 2013-10-04 ENCOUNTER — Encounter (HOSPITAL_COMMUNITY)
Admission: EM | Disposition: A | Discharge status: Skilled Nursing Facility | Payer: Self-pay | Source: Home / Self Care | Attending: Orthopedic Surgery

## 2013-10-04 HISTORY — PX: ACETABULAR REVISION: SHX5712

## 2013-10-04 LAB — COMPREHENSIVE METABOLIC PANEL
ALT: 15 U/L (ref 0–35)
AST: 25 U/L (ref 0–37)
Albumin: 2.9 g/dL — ABNORMAL LOW (ref 3.5–5.2)
Alkaline Phosphatase: 268 U/L — ABNORMAL HIGH (ref 39–117)
Anion gap: 8 (ref 5–15)
BUN: 12 mg/dL (ref 6–23)
CO2: 28 mEq/L (ref 19–32)
Calcium: 8.7 mg/dL (ref 8.4–10.5)
Chloride: 106 mEq/L (ref 96–112)
Creatinine, Ser: 0.62 mg/dL (ref 0.50–1.10)
GFR calc Af Amer: >90 mL/min (ref >90)
GFR calc non Af Amer: >90 mL/min (ref >90)
Glucose, Bld: 93 mg/dL (ref 70–99)
Potassium: 3.9 mEq/L (ref 3.7–5.3)
Sodium: 142 mEq/L (ref 137–147)
Total Bilirubin: <0.2 mg/dL — ABNORMAL LOW (ref 0.3–1.2)
Total Protein: 6.5 g/dL (ref 6.0–8.3)

## 2013-10-04 LAB — CBC
HCT: 30.1 % — ABNORMAL LOW (ref 36.0–46.0)
HEMATOCRIT: 31.6 % — AB (ref 36.0–46.0)
HEMOGLOBIN: 10.4 g/dL — AB (ref 12.0–15.0)
Hemoglobin: 9.3 g/dL — ABNORMAL LOW (ref 12.0–15.0)
MCH: 29.2 pg (ref 26.0–34.0)
MCH: 29.5 pg (ref 26.0–34.0)
MCHC: 30.9 g/dL (ref 30.0–36.0)
MCHC: 32.9 g/dL (ref 30.0–36.0)
MCV: 94.4 fL (ref 78.0–100.0)
MCV: 89.8 fL (ref 78.0–100.0)
PLATELETS: 439 10*3/uL — AB (ref 150–400)
Platelets: 349 10*3/uL (ref 150–400)
RBC: 3.19 MIL/uL — ABNORMAL LOW (ref 3.87–5.11)
RBC: 3.52 MIL/uL — AB (ref 3.87–5.11)
RDW: 14.6 % (ref 11.5–15.5)
RDW: 14.9 % (ref 11.5–15.5)
WBC: 7.6 10*3/uL (ref 4.0–10.5)
WBC: 22.7 10*3/uL — ABNORMAL HIGH (ref 4.0–10.5)

## 2013-10-04 LAB — PREPARE RBC (CROSSMATCH)

## 2013-10-04 LAB — PROTIME-INR
INR: 1 (ref 0.00–1.49)
Prothrombin Time: 13.2 seconds (ref 11.6–15.2)

## 2013-10-04 SURGERY — REVISION, TOTAL ARTHROPLASTY, HIP, ACETABULAR COMPONENT
Anesthesia: General | Site: Hip | Laterality: Left

## 2013-10-04 MED ORDER — HYDROMORPHONE HCL 1 MG/ML IJ SOLN
0.2500 mg | INTRAMUSCULAR | Status: DC | PRN
  Administered 2013-10-04 (×4): 0.5 mg via INTRAVENOUS

## 2013-10-04 MED ORDER — VANCOMYCIN HCL IN DEXTROSE 1-5 GM/200ML-% IV SOLN
INTRAVENOUS | Status: AC
  Filled 2013-10-04: qty 200

## 2013-10-04 MED ORDER — PROMETHAZINE HCL 25 MG/ML IJ SOLN: 6.2500 mg | INTRAMUSCULAR | Status: DC | PRN

## 2013-10-04 MED ORDER — PROPOFOL 10 MG/ML IV BOLUS
INTRAVENOUS | Status: AC
  Filled 2013-10-04: qty 20

## 2013-10-04 MED ORDER — ONDANSETRON HCL 4 MG/2ML IJ SOLN
INTRAMUSCULAR | Status: AC
  Filled 2013-10-04: qty 2

## 2013-10-04 MED ORDER — HYDROMORPHONE HCL 2 MG PO TABS
1.0000 mg | ORAL_TABLET | ORAL | Status: DC | PRN
  Filled 2013-10-04: qty 1

## 2013-10-04 MED ORDER — ONDANSETRON HCL 4 MG/2ML IJ SOLN
INTRAMUSCULAR | Status: DC | PRN
  Administered 2013-10-04: 4 mg via INTRAVENOUS

## 2013-10-04 MED ORDER — METOCLOPRAMIDE HCL 10 MG PO TABS: 5.0000 mg | ORAL_TABLET | Freq: Three times a day (TID) | ORAL | Status: DC | PRN

## 2013-10-04 MED ORDER — NEOSTIGMINE METHYLSULFATE 10 MG/10ML IV SOLN
INTRAVENOUS | Status: AC
  Filled 2013-10-04: qty 1

## 2013-10-04 MED ORDER — LACTATED RINGERS IV SOLN
INTRAVENOUS | Status: DC | PRN
  Administered 2013-10-04 (×2): via INTRAVENOUS

## 2013-10-04 MED ORDER — BUPIVACAINE LIPOSOME 1.3 % IJ SUSP
20.0000 mL | Freq: Once | INTRAMUSCULAR | Status: DC
  Filled 2013-10-04 (×2): qty 20

## 2013-10-04 MED ORDER — DEXAMETHASONE SODIUM PHOSPHATE 10 MG/ML IJ SOLN
INTRAMUSCULAR | Status: DC | PRN
  Administered 2013-10-04: 10 mg via INTRAVENOUS

## 2013-10-04 MED ORDER — SODIUM CHLORIDE 0.9 % IV SOLN
INTRAVENOUS | Status: DC
  Administered 2013-10-04 – 2013-10-05 (×2): via INTRAVENOUS

## 2013-10-04 MED ORDER — HYDROMORPHONE HCL 1 MG/ML IJ SOLN
INTRAMUSCULAR | Status: AC
  Filled 2013-10-04: qty 1

## 2013-10-04 MED ORDER — LACTATED RINGERS IV SOLN: INTRAVENOUS | Status: DC

## 2013-10-04 MED ORDER — LIDOCAINE HCL (CARDIAC) 20 MG/ML IV SOLN
INTRAVENOUS | Status: AC
  Filled 2013-10-04: qty 5

## 2013-10-04 MED ORDER — FENTANYL CITRATE 0.05 MG/ML IJ SOLN
INTRAMUSCULAR | Status: AC
  Filled 2013-10-04: qty 5

## 2013-10-04 MED ORDER — GLYCOPYRROLATE 0.2 MG/ML IJ SOLN
INTRAMUSCULAR | Status: AC
  Filled 2013-10-04: qty 3

## 2013-10-04 MED ORDER — BUPIVACAINE HCL (PF) 0.25 % IJ SOLN
INTRAMUSCULAR | Status: AC
  Filled 2013-10-04: qty 30

## 2013-10-04 MED ORDER — STERILE WATER FOR IRRIGATION IR SOLN
Status: DC | PRN
  Administered 2013-10-04: 1500 mL

## 2013-10-04 MED ORDER — DEXAMETHASONE SODIUM PHOSPHATE 10 MG/ML IJ SOLN
INTRAMUSCULAR | Status: AC
  Filled 2013-10-04: qty 1

## 2013-10-04 MED ORDER — HYDROCODONE-ACETAMINOPHEN 5-325 MG PO TABS
1.0000 | ORAL_TABLET | ORAL | Status: DC | PRN
  Administered 2013-10-04: 1 via ORAL
  Administered 2013-10-05: 2 via ORAL
  Filled 2013-10-04 (×2): qty 2
  Filled 2013-10-04: qty 1

## 2013-10-04 MED ORDER — VANCOMYCIN HCL IN DEXTROSE 1-5 GM/200ML-% IV SOLN
1000.0000 mg | Freq: Once | INTRAVENOUS | Status: AC
  Administered 2013-10-04: 1000 mg via INTRAVENOUS
  Filled 2013-10-04: qty 200

## 2013-10-04 MED ORDER — SUCCINYLCHOLINE CHLORIDE 20 MG/ML IJ SOLN
INTRAMUSCULAR | Status: DC | PRN
  Administered 2013-10-04: 100 mg via INTRAVENOUS

## 2013-10-04 MED ORDER — MEPERIDINE HCL 25 MG/ML IJ SOLN: 6.2500 mg | INTRAMUSCULAR | Status: DC | PRN

## 2013-10-04 MED ORDER — SODIUM CHLORIDE 0.9 % IJ SOLN
INTRAMUSCULAR | Status: AC
  Filled 2013-10-04: qty 50

## 2013-10-04 MED ORDER — PHENYLEPHRINE HCL 10 MG/ML IJ SOLN
INTRAMUSCULAR | Status: AC
  Filled 2013-10-04: qty 1

## 2013-10-04 MED ORDER — PROPOFOL 10 MG/ML IV BOLUS
INTRAVENOUS | Status: DC | PRN
  Administered 2013-10-04: 120 mg via INTRAVENOUS

## 2013-10-04 MED ORDER — MIDAZOLAM HCL 5 MG/5ML IJ SOLN
INTRAMUSCULAR | Status: DC | PRN
  Administered 2013-10-04: 2 mg via INTRAVENOUS

## 2013-10-04 MED ORDER — ONDANSETRON HCL 4 MG PO TABS: 4.0000 mg | ORAL_TABLET | Freq: Four times a day (QID) | ORAL | Status: DC | PRN

## 2013-10-04 MED ORDER — GLYCOPYRROLATE 0.2 MG/ML IJ SOLN
INTRAMUSCULAR | Status: DC | PRN
  Administered 2013-10-04: 0.6 mg via INTRAVENOUS

## 2013-10-04 MED ORDER — SODIUM CHLORIDE 0.9 % IR SOLN
Status: DC | PRN
  Administered 2013-10-04: 1000 mL

## 2013-10-04 MED ORDER — PHENYLEPHRINE HCL 10 MG/ML IJ SOLN
10.0000 mg | INTRAVENOUS | Status: DC | PRN
  Administered 2013-10-04: 10 ug/min via INTRAVENOUS

## 2013-10-04 MED ORDER — MORPHINE SULFATE 2 MG/ML IJ SOLN
1.0000 mg | INTRAMUSCULAR | Status: DC | PRN
  Administered 2013-10-04 – 2013-10-05 (×2): 1 mg via INTRAVENOUS
  Filled 2013-10-04 (×2): qty 1

## 2013-10-04 MED ORDER — CISATRACURIUM BESYLATE 20 MG/10ML IV SOLN
INTRAVENOUS | Status: AC
  Filled 2013-10-04: qty 10

## 2013-10-04 MED ORDER — LACTATED RINGERS IV SOLN
INTRAVENOUS | Status: DC | PRN
  Administered 2013-10-04: 18:00:00 via INTRAVENOUS

## 2013-10-04 MED ORDER — SODIUM CHLORIDE 0.9 % IV SOLN: Freq: Once | INTRAVENOUS | Status: DC

## 2013-10-04 MED ORDER — CISATRACURIUM BESYLATE (PF) 10 MG/5ML IV SOLN
INTRAVENOUS | Status: DC | PRN
  Administered 2013-10-04 (×2): 4 mg via INTRAVENOUS

## 2013-10-04 MED ORDER — FENTANYL CITRATE 0.05 MG/ML IJ SOLN
INTRAMUSCULAR | Status: DC | PRN
  Administered 2013-10-04 (×3): 50 ug via INTRAVENOUS

## 2013-10-04 MED ORDER — ONDANSETRON HCL 4 MG/2ML IJ SOLN: 4.0000 mg | Freq: Four times a day (QID) | INTRAMUSCULAR | Status: DC | PRN

## 2013-10-04 MED ORDER — LACTATED RINGERS IV SOLN
INTRAVENOUS | Status: DC
  Administered 2013-10-04: 1000 mL via INTRAVENOUS

## 2013-10-04 MED ORDER — 0.9 % SODIUM CHLORIDE (POUR BTL) OPTIME
TOPICAL | Status: DC | PRN
  Administered 2013-10-04: 1000 mL

## 2013-10-04 MED ORDER — METOCLOPRAMIDE HCL 5 MG/ML IJ SOLN: 5.0000 mg | Freq: Three times a day (TID) | INTRAMUSCULAR | Status: DC | PRN

## 2013-10-04 MED ORDER — PHENYLEPHRINE HCL 10 MG/ML IJ SOLN
INTRAMUSCULAR | Status: DC | PRN
  Administered 2013-10-04: 80 ug via INTRAVENOUS

## 2013-10-04 MED ORDER — MIDAZOLAM HCL 2 MG/2ML IJ SOLN
INTRAMUSCULAR | Status: AC
  Filled 2013-10-04: qty 2

## 2013-10-04 MED ORDER — LABETALOL HCL 5 MG/ML IV SOLN
INTRAVENOUS | Status: AC
Start: 2013-10-04 — End: 2013-10-05
  Filled 2013-10-04: qty 4

## 2013-10-04 MED ORDER — NEOSTIGMINE METHYLSULFATE 10 MG/10ML IV SOLN
INTRAVENOUS | Status: DC | PRN
  Administered 2013-10-04: 4 mg via INTRAVENOUS

## 2013-10-04 MED ORDER — LABETALOL HCL 5 MG/ML IV SOLN
10.0000 mg | INTRAVENOUS | Status: DC | PRN
  Administered 2013-10-04: 10 mg via INTRAVENOUS
  Filled 2013-10-04: qty 4

## 2013-10-04 SURGICAL SUPPLY — 64 items
BAG ZIPLOCK 12X15 (MISCELLANEOUS) IMPLANT
BIT DRILL 2.8X128 (BIT) IMPLANT
BIT DRILL 2.8X128MM (BIT)
BLADE EXTENDED COATED 6.5IN (ELECTRODE) ×3 IMPLANT
BLADE SAW SAG 73X25 THK (BLADE)
BLADE SAW SGTL 73X25 THK (BLADE) IMPLANT
COVER MAYO STAND STRL (DRAPES) ×3 IMPLANT
DRAPE INCISE IOBAN 66X45 STRL (DRAPES) ×3 IMPLANT
DRAPE LG THREE QUARTER DISP (DRAPES) ×9 IMPLANT
DRAPE POUCH INSTRU U-SHP 10X18 (DRAPES) ×3 IMPLANT
DRAPE SPLIT 77X100IN (DRAPES) ×6 IMPLANT
DRAPE U-SHAPE 47X51 STRL (DRAPES) ×3 IMPLANT
DRSG ADAPTIC 3X8 NADH LF (GAUZE/BANDAGES/DRESSINGS) ×3 IMPLANT
DRSG EMULSION OIL 3X16 NADH (GAUZE/BANDAGES/DRESSINGS) IMPLANT
DRSG MEPILEX BORDER 4X12 (GAUZE/BANDAGES/DRESSINGS) ×3 IMPLANT
DRSG MEPILEX BORDER 4X4 (GAUZE/BANDAGES/DRESSINGS) ×3 IMPLANT
DRSG MEPILEX BORDER 4X8 (GAUZE/BANDAGES/DRESSINGS) IMPLANT
DURAPREP 26ML APPLICATOR (WOUND CARE) ×3 IMPLANT
ELECT REM PT RETURN 9FT ADLT (ELECTROSURGICAL) ×3
ELECTRODE REM PT RTRN 9FT ADLT (ELECTROSURGICAL) ×1 IMPLANT
EVACUATOR 1/8 PVC DRAIN (DRAIN) ×6 IMPLANT
FACESHIELD WRAPAROUND (MASK) ×9 IMPLANT
GAUZE SPONGE 4X4 12PLY STRL (GAUZE/BANDAGES/DRESSINGS) ×3 IMPLANT
GLOVE BIO SURGEON STRL SZ7.5 (GLOVE) ×3 IMPLANT
GLOVE BIO SURGEON STRL SZ8 (GLOVE) ×3 IMPLANT
GLOVE BIOGEL PI IND STRL 8 (GLOVE) ×3 IMPLANT
GLOVE BIOGEL PI INDICATOR 8 (GLOVE) ×6
GOWN STRL REUS W/TWL LRG LVL3 (GOWN DISPOSABLE) IMPLANT
GOWN STRL REUS W/TWL XL LVL3 (GOWN DISPOSABLE) ×15 IMPLANT
HANDPIECE INTERPULSE COAX TIP (DISPOSABLE) ×2
HEAD FEM STD 28X+1.5 STRL (Hips) ×3 IMPLANT
IMMOBILIZER KNEE 20 (SOFTGOODS)
IMMOBILIZER KNEE 20 THIGH 36 (SOFTGOODS) IMPLANT
KIT BASIN OR (CUSTOM PROCEDURE TRAY) ×3 IMPLANT
LINER PINN GVF POLY ACE 28 HIP (Liner) ×3 IMPLANT
MANIFOLD NEPTUNE II (INSTRUMENTS) ×6 IMPLANT
NDL SAFETY ECLIPSE 18X1.5 (NEEDLE) IMPLANT
NEEDLE HYPO 18GX1.5 SHARP (NEEDLE)
NS IRRIG 1000ML POUR BTL (IV SOLUTION) ×3 IMPLANT
PACK TOTAL JOINT (CUSTOM PROCEDURE TRAY) ×3 IMPLANT
PADDING CAST COTTON 6X4 STRL (CAST SUPPLIES) IMPLANT
PASSER SUT SWANSON 36MM LOOP (INSTRUMENTS) IMPLANT
POSITIONER SURGICAL ARM (MISCELLANEOUS) ×3 IMPLANT
SCOTCHCAST PLUS 4X4 WHITE (CAST SUPPLIES) ×3 IMPLANT
SCREW PINN CAN 6.5X20 (Screw) ×6 IMPLANT
SCREW PINN CAN BONE 6.5MMX15MM (Screw) ×3 IMPLANT
SCREW PINN CAN BOWN 6.5X8 HIP (Screw) ×3 IMPLANT
SET HNDPC FAN SPRY TIP SCT (DISPOSABLE) ×1 IMPLANT
SPONGE LAP 18X18 X RAY DECT (DISPOSABLE) ×3 IMPLANT
STAPLER VISISTAT 35W (STAPLE) ×3 IMPLANT
STEM SLVE SOLUTION STRT 8IN (Hips) ×3 IMPLANT
SUCTION FRAZIER TIP 10 FR DISP (SUCTIONS) ×3 IMPLANT
SUT ETHIBOND NAB CT1 #1 30IN (SUTURE) IMPLANT
SUT ETHILON 4 0 PS 2 18 (SUTURE) ×6 IMPLANT
SUT VIC AB 1 CT1 27 (SUTURE) ×6
SUT VIC AB 1 CT1 27XBRD ANTBC (SUTURE) ×3 IMPLANT
SUT VIC AB 2-0 CT1 27 (SUTURE) ×6
SUT VIC AB 2-0 CT1 TAPERPNT 27 (SUTURE) ×3 IMPLANT
SWAB COLLECTION DEVICE MRSA (MISCELLANEOUS) IMPLANT
SYR 50ML LL SCALE MARK (SYRINGE) IMPLANT
TOWEL OR 17X26 10 PK STRL BLUE (TOWEL DISPOSABLE) ×6 IMPLANT
TRAY FOLEY CATH 14FRSI W/METER (CATHETERS) ×3 IMPLANT
TUBE ANAEROBIC SPECIMEN COL (MISCELLANEOUS) IMPLANT
WATER STERILE IRR 1500ML POUR (IV SOLUTION) ×3 IMPLANT

## 2013-10-04 NOTE — Brief Op Note (Signed)
10/01/2013 - 10/04/2013  7:04 PM  PATIENT:  [name]  63 y.o. female  PRE-OPERATIVE DIAGNOSIS:  Left hip dislocation  POST-OPERATIVE DIAGNOSIS: Left hip dislocation with periprosthetic femur fracture.  PROCEDURE:  Procedure(s): LEFT HIP TOTAL REVISION (Left)  SURGEON:  Surgeon(s) and Role:    * [name], MD - Primary  PHYSICIAN ASSISTANT:   ASSISTANTS: Arlee Muslim, PA-C   ANESTHESIA:   general  EBL:   1300 ml  DRAINS: (Medium) Hemovact drain(s) in the left hip with  Suction Open   LOCAL MEDICATIONS USED:  NONE  COUNTS:  YES  TOURNIQUET:  * No tourniquets in log *  DICTATION: .Other Dictation: Dictation Number [id number]  PLAN OF CARE: Admit to inpatient   PATIENT DISPOSITION:  PACU - hemodynamically stable.

## 2013-10-04 NOTE — Transfer of Care (Signed)
Immediate Anesthesia Transfer of Care Note  Patient: [name]  Procedure(s) Performed: Procedure(s): LEFT HIP TOTAL REVISION (Left)  Patient Location: PACU  Anesthesia Type:General  Level of Consciousness: awake, sedated and patient cooperative  Airway & Oxygen Therapy: Patient Spontanous Breathing and Patient connected to face mask oxygen  Post-op Assessment: Report given to PACU RN and Post -op Vital signs reviewed and stable  Post vital signs: Reviewed and stable  Complications: No apparent anesthesia complications

## 2013-10-04 NOTE — Interval H&P Note (Signed)
History and Physical Interval Note:  10/04/2013 4:29 PM  [name]  has presented today for surgery, with the diagnosis of loose acetabular cup  The various methods of treatment have been discussed with the patient and family. After consideration of risks, benefits and other options for treatment, the patient has consented to  Procedure(s): LEFT HIP ACETABULAR REVISION WITH CONSTRAINED LINER (Left) as a surgical intervention .  The patient's history has been reviewed, patient examined, no change in status, stable for surgery.  I have reviewed the patient's chart and labs.  Questions were answered to the patient's satisfaction.     [name]

## 2013-10-04 NOTE — H&P (View-Only) (Signed)
Subjective: 2 Days Post-Op Procedure(s) (LRB): Attempted CLOSED REDUCTION HIP and dressing change  (Left) Patient reports pain as moderate.  Located to Left hip. Tolerating PO's well. No other complaints. Denies SOB, CP, or Calf pain.   Objective: Vital signs in last 24 hours: Temp:  [98.4 F (36.9 C)-98.7 F (37.1 C)] 98.4 F (36.9 C) (09/20 0530) Pulse Rate:  [76-87] 76 (09/20 0530) Resp:  [16-18] 17 (09/20 0530) BP: (106-120)/(46-56) 120/56 mmHg (09/20 0530) SpO2:  [96 %-98 %] 97 % (09/20 0530)  Intake/Output from previous day: 09/19 0701 - 09/20 0700 In: 1030 [P.O.:480; I.V.:500; IV Piggyback:50] Out: 800 [Urine:800] Intake/Output this shift:     Recent Labs  10/02/13 0545 10/03/13 0450  HGB 9.2* 8.6*    Recent Labs  10/02/13 0545 10/03/13 0450  WBC 10.0 6.4  RBC 3.16* 2.94*  HCT 29.4* 27.2*  PLT 454* 399    Recent Labs  10/02/13 0545 10/03/13 0450  NA 144 141  K 3.9 3.7  CL 106 104  CO2 28 28  BUN 12 14  CREATININE 0.56 0.63  GLUCOSE 99 112*  CALCIUM 8.8 8.5   No results found for this basename: LABPT, INR,  in the last 72 hours  Lying in bed. A&O x3. Left hip incisions WNL.  Left foot externally rotated. LLE n/V intact. Tenderness to Left hip. RLE amputation.   Assessment/Plan: 2 Days Post-Op Procedure(s) (LRB): Attempted CLOSED REDUCTION HIP and dressing change  (Left) Dr. Veverly Fells discussed with Dr. Wynelle Link.  NPO MN Continue current care Possible reduction tomorrow per Dr. Kandace Blitz, Tion Tse L 10/03/2013, 8:35 AM

## 2013-10-04 NOTE — Anesthesia Postprocedure Evaluation (Signed)
  Anesthesia Post-op Note  Patient: [name]  Procedure(s) Performed: Procedure(s) (LRB): LEFT HIP TOTAL REVISION (Left)  Patient Location: PACU  Anesthesia Type: General  Level of Consciousness: awake and alert   Airway and Oxygen Therapy: Patient Spontanous Breathing  Post-op Pain: mild  Post-op Assessment: Post-op Vital signs reviewed, Patient's Cardiovascular Status Stable, Respiratory Function Stable, Patent Airway and No signs of Nausea or vomiting  Last Vitals:  Filed Vitals:   10/04/13 1915  BP: 155/75  Pulse: 113  Temp: 36.9 C  Resp: 10    Post-op Vital Signs: stable   Complications: No apparent anesthesia complications

## 2013-10-04 NOTE — Progress Notes (Signed)
Subjective: Hospital day - 4 Patient reports pain as mild.   Patient seen in rounds with Dr. Wynelle Link. Patient is well, but has had some minor complaints of pain in the hip, requiring pain medications Failed attempt at closed reduction of the dislocated hip.  Will convert to a constrained liner later this afternoon. Briefly discussed the surgery.  Will allow full liquids and then NPO after breakfast.  Objective: Vital signs in last 24 hours: Temp:  [97.9 F (36.6 C)-98.2 F (36.8 C)] 97.9 F (36.6 C) (09/21 0505) Pulse Rate:  [81-83] 81 (09/21 0505) Resp:  [17-18] 17 (09/21 0505) BP: (129-132)/(53-62) 132/62 mmHg (09/21 0505) SpO2:  [97 %-99 %] 98 % (09/21 0505)  Intake/Output from previous day:  Intake/Output Summary (Last 24 hours) at 10/04/13 0647 Last data filed at 10/03/13 1533  Gross per 24 hour  Intake 1183.33 ml  Output    801 ml  Net 382.33 ml    Labs:  Recent Labs  10/02/13 0545 10/03/13 0450 10/04/13 0540  HGB 9.2* 8.6* 9.3*    Recent Labs  10/03/13 0450 10/04/13 0540  WBC 6.4 7.6  RBC 2.94* 3.19*  HCT 27.2* 30.1*  PLT 399 439*    Recent Labs  10/03/13 0450 10/04/13 0540  NA 141 142  K 3.7 3.9  CL 104 106  CO2 28 28  BUN 14 12  CREATININE 0.63 0.62  GLUCOSE 112* 93  CALCIUM 8.5 8.7    Recent Labs  10/04/13 0540  INR 1.00    EXAM General - Patient is Alert, Appropriate and Oriented Extremity - Neurovascular intact Sensation intact distally Dorsiflexion/Plantar flexion intact Dressing - scant drainage with serousanginous noted. Motor Function - intact, moving foot and toes well on exam.   Past Medical History  Diagnosis Date  . Complication of anesthesia     NAUSEA  . Chest pain     pt states due to esophageal problem -  primary care MD aware   . Hives     since pneumonia vaccine   . Asthma   . Depression   . Anxiety   . Fibromyalgia   . Spondylosis   . Bulging disc   . 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 Phantom limb pain     rt leg  . Leg pain, left   . Eczema     ears  . GERD (gastroesophageal reflux disease)     " alot of burping"  . Constipation, chronic   . Urgency incontinence   . History of transfusion   . Cancer     hx sarcoma rt knee with amputation  . Difficulty sleeping   . Sleep apnea     pt had recent sleep study done but does not know results - report called for and put on chart showing significant obstructive sleep apnea  . Hypertension   . Back pain 08/19/13    low back and  left leg pain due to nerve problems per pt  . PONV (postoperative nausea and vomiting)   . Peripheral neuropathy   . History of bronchitis   . History of pneumonia   . Hiatal hernia   . History of scarlet fever   . Postmenopausal   . History of measles   . History of mumps   . History of rubella     Assessment/Plan: Hospital day - 4  Active Problems:   Closed dislocation of hip  Estimated body  mass index is 27.98 kg/(m^2) as calculated from the following:   Height as of this encounter: 5\' 2"  (1.575 m).   Weight as of this encounter: 69.4 kg (153 lb). Consent today. Type and screen for possible blood today with surgery. Liquid breakfast NPO after breakfast Vanc IV on call to [place], PA-C Orthopaedic Surgery 10/04/2013, 6:47 AM

## 2013-10-04 NOTE — Anesthesia Preprocedure Evaluation (Signed)
Anesthesia Evaluation  Patient identified by MRN, date of birth, ID band Patient awake    Reviewed: Allergy & Precautions, H&P , NPO status , Patient's Chart, lab work & pertinent test results  History of Anesthesia Complications (+) PONV  Airway Mallampati: III TM Distance: >3 FB Neck ROM: Full    Dental  (+) Teeth Intact, Dental Advisory Given   Pulmonary asthma , sleep apnea , former smoker,  breath sounds clear to auscultation        Cardiovascular hypertension, Rhythm:Regular Rate:Normal     Neuro/Psych PSYCHIATRIC DISORDERS Anxiety Depression  Neuromuscular disease    GI/Hepatic Neg liver ROS, hiatal hernia, GERD-  ,  Endo/Other  negative endocrine ROS  Renal/GU negative Renal ROS     Musculoskeletal  (+) Fibromyalgia -  Abdominal   Peds  Hematology  (+) anemia ,   Anesthesia Other Findings   Reproductive/Obstetrics negative OB ROS                           Anesthesia Physical  Anesthesia Plan  ASA: III  Anesthesia Plan: General   Post-op Pain Management:    Induction: Intravenous  Airway Management Planned: Oral ETT  Additional Equipment: None  Intra-op Plan:   Post-operative Plan: Extubation in OR  Informed Consent: I have reviewed the patients History and Physical, chart, labs and discussed the procedure including the risks, benefits and alternatives for the proposed anesthesia with the patient or authorized representative who has indicated his/her understanding and acceptance.   Dental advisory given  Plan Discussed with: CRNA and Surgeon  Anesthesia Plan Comments:         Anesthesia Quick Evaluation

## 2013-10-05 ENCOUNTER — Encounter (HOSPITAL_COMMUNITY): Payer: Self-pay | Admitting: Orthopedic Surgery

## 2013-10-05 LAB — CBC
HEMATOCRIT: 28.5 % — AB (ref 36.0–46.0)
HEMOGLOBIN: 9.6 g/dL — AB (ref 12.0–15.0)
MCH: 30.3 pg (ref 26.0–34.0)
MCHC: 33.7 g/dL (ref 30.0–36.0)
MCV: 89.9 fL (ref 78.0–100.0)
Platelets: 344 10*3/uL (ref 150–400)
RBC: 3.17 MIL/uL — AB (ref 3.87–5.11)
RDW: 15.6 % — ABNORMAL HIGH (ref 11.5–15.5)
WBC: 16.6 10*3/uL — ABNORMAL HIGH (ref 4.0–10.5)

## 2013-10-05 LAB — BASIC METABOLIC PANEL
Anion gap: 8 (ref 5–15)
BUN: 15 mg/dL (ref 6–23)
CHLORIDE: 105 meq/L (ref 96–112)
CO2: 25 meq/L (ref 19–32)
Calcium: 8.1 mg/dL — ABNORMAL LOW (ref 8.4–10.5)
Creatinine, Ser: 0.79 mg/dL (ref 0.50–1.10)
GFR calc Af Amer: >90 mL/min (ref >90)
GFR calc non Af Amer: 87 mL/min — ABNORMAL LOW (ref >90)
GLUCOSE: 178 mg/dL — AB (ref 70–99)
Potassium: 4.7 mEq/L (ref 3.7–5.3)
SODIUM: 138 meq/L (ref 137–147)

## 2013-10-05 MED ORDER — HYDROMORPHONE HCL 2 MG PO TABS
2.0000 mg | ORAL_TABLET | ORAL | Status: DC | PRN
  Administered 2013-10-05 – 2013-10-06 (×8): 4 mg via ORAL
  Administered 2013-10-07: 2 mg via ORAL
  Administered 2013-10-07: 4 mg via ORAL
  Administered 2013-10-07: 2 mg via ORAL
  Administered 2013-10-08 (×2): 4 mg via ORAL
  Filled 2013-10-05 (×7): qty 2
  Filled 2013-10-05: qty 1
  Filled 2013-10-05 (×5): qty 2

## 2013-10-05 NOTE — Clinical Documentation Improvement (Signed)
10/05/13 Dear [name],   Possible Clinical Conditions?    Expected Acute Blood Loss Anemia  Acute Blood Loss Anemia  Acute on chronic blood loss anemia  Other Condition  Cannot Clinically Determine    Signs and Symptoms: EBL: 1650 ml per 9/21 Anesthesia record.  Diagnostics: H&H on 9/22:  9.6/28.5 H&H on 9/21:  10.4/31.6  Treatments: Transfusion: PRBC: 670 ml per 9/21 Anesthesia record.   Thank You, Theron Arista, Clinical Documentation Specialist:  [phone]  [place] Information Management

## 2013-10-05 NOTE — Progress Notes (Signed)
Subjective: 1 Day Post-Op Procedure(s) (LRB): LEFT HIP TOTAL REVISION (Left) Patient reports pain as mild and moderate.   Patient seen in rounds for Dr. Wynelle Link.  Not much sleep last night.  Pressures were soft last night.  She received two units of blood during the surgery last night.  HGB is 9.6 today.  Will monitor blood count and monitor for symptoms.  Denies any CP/SOB/Nausea/Dizziness at this time.  Asking about Dilaudid tabs.  She used them with previous surgeries and did well. Patient is having problems with pain in the hip and thigh, requiring pain medications We will start therapy today.  Plan is to go Skilled nursing facility after hospital stay.  Objective: Vital signs in last 24 hours: Temp:  [97.8 F (36.6 C)-98.7 F (37.1 C)] 98.1 F (36.7 C) (09/22 0514) Pulse Rate:  [87-125] 101 (09/22 0514) Resp:  [9-16] 16 (09/22 0514) BP: (86-157)/(52-83) 117/66 mmHg (09/22 0514) SpO2:  [96 %-100 %] 98 % (09/22 0514)  Intake/Output from previous day:  Intake/Output Summary (Last 24 hours) at 10/05/13 0854 Last data filed at 10/05/13 0839  Gross per 24 hour  Intake   6540 ml  Output   4825 ml  Net   1715 ml    Intake/Output this shift: Total I/O In: 240 [P.O.:240] Out: -   Labs:  Recent Labs  10/03/13 0450 10/04/13 0540 10/04/13 1953 10/05/13 0435  HGB 8.6* 9.3* 10.4* 9.6*    Recent Labs  10/04/13 1953 10/05/13 0435  WBC 22.7* 16.6*  RBC 3.52* 3.17*  HCT 31.6* 28.5*  PLT 349 344    Recent Labs  10/04/13 0540 10/05/13 0435  NA 142 138  K 3.9 4.7  CL 106 105  CO2 28 25  BUN 12 15  CREATININE 0.62 0.79  GLUCOSE 93 178*  CALCIUM 8.7 8.1*    Recent Labs  10/04/13 0540  INR 1.00    EXAM General - Patient is Alert, Appropriate and Oriented Extremity - Neurovascular intact Sensation intact distally Dorsiflexion/Plantar flexion intact No cellulitis present Dressing - dressing C/D/I and scant drainage at most proximal tip of dressing but no  where near the amount of drainage noted yesterday prior to surgery (noted hematoma in soft tissues yesterday at time of surgery). Motor Function - intact, moving foot and toes well on exam.  Hemovacs Left In Place  **DRAINS ARE SEWN IN**  Past Medical History  Diagnosis Date  . Complication of anesthesia     NAUSEA  . Chest pain     pt states due to esophageal problem -  primary care MD aware   . Hives     since pneumonia vaccine   . Asthma   . Depression   . Anxiety   . Fibromyalgia   . Spondylosis   . Bulging disc   . 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 Phantom limb pain     rt leg  . Leg pain, left   . Eczema     ears  . GERD (gastroesophageal reflux disease)     " alot of burping"  . Constipation, chronic   . Urgency incontinence   . History of transfusion   . Cancer     hx sarcoma rt knee with amputation  . Difficulty sleeping   . Sleep apnea     pt had recent sleep study done but does not know results - report called for and put on chart showing significant  obstructive sleep apnea  . Hypertension   . Back pain 08/19/13    low back and  left leg pain due to nerve problems per pt  . PONV (postoperative nausea and vomiting)   . Peripheral neuropathy   . History of bronchitis   . History of pneumonia   . Hiatal hernia   . History of scarlet fever   . Postmenopausal   . History of measles   . History of mumps   . History of rubella     Assessment/Plan: 1 Day Post-Op Procedure(s) (LRB): LEFT HIP TOTAL REVISION (Left) Active Problems:   Closed dislocation of hip  Estimated body mass index is 27.98 kg/(m^2) as calculated from the following:   Height as of this encounter: 5\' 2"  (1.575 m).   Weight as of this encounter: 69.4 kg (153 lb). Advance diet Up with therapy Discharge to SNF  DVT Prophylaxis - Aspirin 325 mg Toe Touch Weight Bearing ONLY to left leg.  Bed to char transfers only for now D/C Knee Immobilizer Hemovacs Left In Place  **DRAINS  ARE SEWN IN** Begin Therapy Hip Preacutions  Arlee Muslim, PA-C Orthopaedic Surgery 10/05/2013, 8:54 AM

## 2013-10-05 NOTE — Progress Notes (Signed)
Agree with previous RN assessment, will continue to monitor pt  

## 2013-10-05 NOTE — Progress Notes (Signed)
PT Cancellation Note  Patient Details Name: HAGEN TIDD MRN: [id number] DOB: 12-12-1950   Cancelled Treatment:    Reason Eval/Treat Not Completed: Medical issues which prohibited therapy;Pain limiting ability to participate. Nausea    Claretha Cooper 10/05/2013, 4:19 PM Tresa Endo PT [phone]

## 2013-10-05 NOTE — Op Note (Signed)
[name], Barnes NO.:  [id number]  MEDICAL RECORD NO.:  [id number]  LOCATION:  5027                         FACILITY:  [facility]  PHYSICIAN:  Gaynelle Arabian, M.D.    DATE OF BIRTH:  29-Mar-1950  DATE OF PROCEDURE:  10/04/2013 DATE OF DISCHARGE:                              OPERATIVE REPORT   PREOPERATIVE DIAGNOSIS:  Left hip dislocation.  POSTOPERATIVE DIAGNOSIS:  Left hip dislocation with periprosthetic femur fracture.  PROCEDURE:  Left total hip arthroplasty revision.  SURGEON:  Gaynelle Arabian, M.D.  ASSISTANT:  Arlee Muslim, PA-C.  ANESTHESIA:  General.  ESTIMATED BLOOD LOSS:  Approximately 1300.  DRAINS:  Hemovac x2.  COMPLICATIONS:  None.  CONDITION:  Stable to recovery.  BRIEF CLINICAL NOTE:  [name] is a 63 year old female with a very complex history in regards to this left hip.  She has had multiple procedures since July due to initial total hip arthroplasty, then periprosthetic fracture x2.  She presents now with a dislocated hip that could not be closed reduced.  She presents now for open reduction and conversion to a constrained liner.  PROCEDURE IN DETAIL:  After successful administration of general anesthetic, the patient was placed in the right lateral decubitus position with the left side up and held with the hip positioner.  Her left lower extremity was isolated from her perineum with plastic drapes and prepped and draped in the usual sterile fashion.  The previous incisions were utilized, skin cut with a 10 blade through subcutaneous tissue to the fascia lata which was incised in line with the skin incision.  Large hematoma was identified and evacuated.  We thoroughly irrigated the hip with liter of saline with pulsatile lavage.  Then noted that the stem was rotating within the femur.  We inspected more closely and there was a proximal periprosthetic fracture.  I subsequently had to remove the stem.  This gave [place] excellent exposure  of the acetabulum.  The femur was retracted anteriorly to gain acetabular exposure.  The component was visualized and soft tissue around the edges removed.  The cup is stable and is in good position.  I removed the acetabular liner.  We then placed a screw for additional support since I was going to put in a constrained liner.  The constrained liner for 48- mm Pinnacle acetabular shell was then impacted.  We then addressed the femur.  Distally, the femoral bone is fine from the isthmus region down.  I used the flexible reamers to ream up to 15 mm for placement of 15-mm diameter 8-inch solution stem.  We then broached up to the 15.  We trialed with the trial stem and had excellent stability and we marked the appropriate length.  We then impacted the 8-inch solution stem, 15 mm diameter into her femur in about 20 degrees of anteversion.  This was very stable.  I then placed a 28+ 1.5 head and reduced the hip.  Reduction was performed and a locking ring was placed and is intact.  She had a very stable range of motion with no impingement.  The wound was further copiously irrigated with saline.  X-rays taken, AP and lateral and shows a  tiny crack on the anterior cortex of the femur at the tip of the stem.  This was unicortical and just at the tip.  I felt that this was stable and we did not need to do any additional fixation.  At this point, we further irrigated and then closed the fascia lata over Hemovac drain with a running #1 V-Loc suture.  Deep subcu was closed with second #1 V-Loc. Another drain was placed.  Superficial subcu closed with interrupted 2-0 Vicryl and skin closed with staples.  The incision was cleaned and dried.  The drains were hooked to suction.  The drain was then sewn into the skin with 4-0 nylon suture.  A bulky sterile dressing was then applied, and she was awakened and transported to recovery in stable condition.  Note that a surgical assistant is a medical  necessity for this procedure to do it in a safe and expeditious manner.  Surgical assistant was necessary for appropriate positioning of the limb for removal of the old prosthesis and placement of the new prosthesis as well as for retracting vital neurovascular structures.     Gaynelle Arabian, M.D.     FA/MEDQ  D:  10/04/2013  T:  10/05/2013  Job:  [id number]

## 2013-10-06 LAB — CBC
HCT: 20 % — ABNORMAL LOW (ref 36.0–46.0)
Hemoglobin: 6.5 g/dL — CL (ref 12.0–15.0)
MCH: 30 pg (ref 26.0–34.0)
MCHC: 32.5 g/dL (ref 30.0–36.0)
MCV: 92.2 fL (ref 78.0–100.0)
Platelets: 249 10*3/uL (ref 150–400)
RBC: 2.17 MIL/uL — ABNORMAL LOW (ref 3.87–5.11)
RDW: 15.8 % — ABNORMAL HIGH (ref 11.5–15.5)
WBC: 13.3 10*3/uL — ABNORMAL HIGH (ref 4.0–10.5)

## 2013-10-06 LAB — BASIC METABOLIC PANEL
Anion gap: 8 (ref 5–15)
BUN: 14 mg/dL (ref 6–23)
CO2: 26 mEq/L (ref 19–32)
Calcium: 8 mg/dL — ABNORMAL LOW (ref 8.4–10.5)
Chloride: 107 mEq/L (ref 96–112)
Creatinine, Ser: 0.63 mg/dL (ref 0.50–1.10)
GFR calc Af Amer: >90 mL/min (ref >90)
GFR calc non Af Amer: >90 mL/min (ref >90)
Glucose, Bld: 106 mg/dL — ABNORMAL HIGH (ref 70–99)
Potassium: 3.9 mEq/L (ref 3.7–5.3)
Sodium: 141 mEq/L (ref 137–147)

## 2013-10-06 LAB — PREPARE RBC (CROSSMATCH)

## 2013-10-06 MED ORDER — SODIUM CHLORIDE 0.9 % IV SOLN
Freq: Once | INTRAVENOUS | Status: AC
  Administered 2013-10-06: 06:00:00 via INTRAVENOUS

## 2013-10-06 NOTE — Progress Notes (Signed)
Subjective: 2 Days Post-Op Procedure(s) (LRB): LEFT HIP TOTAL REVISION (Left) Patient reports pain as mild and moderate.   Patient seen in rounds with Dr. Wynelle Link. Patient is well, but has had some minor complaints of pain in the hip and thigh, requiring pain medications Plan is to go Skilled nursing facility after hospital stay.  Objective: Vital signs in last 24 hours: Temp:  [98 F (36.7 C)-100 F (37.8 C)] 99.6 F (37.6 C) (09/23 1415) Pulse Rate:  [82-99] 99 (09/23 1415) Resp:  [14-18] 16 (09/23 1415) BP: (89-119)/(34-56) 111/56 mmHg (09/23 1415) SpO2:  [95 %-98 %] 95 % (09/23 1325)  Intake/Output from previous day:  Intake/Output Summary (Last 24 hours) at 10/06/13 1824 Last data filed at 10/06/13 1700  Gross per 24 hour  Intake   3054 ml  Output   3030 ml  Net     24 ml    Intake/Output this shift: Total I/O In: 1524 [P.O.:840; Blood:684] Out: 1630 [Urine:1500; Drains:130]  Labs:  Recent Labs  10/04/13 0540 10/04/13 1953 10/05/13 0435 10/06/13 0412  HGB 9.3* 10.4* 9.6* 6.5*    Recent Labs  10/05/13 0435 10/06/13 0412  WBC 16.6* 13.3*  RBC 3.17* 2.17*  HCT 28.5* 20.0*  PLT 344 249    Recent Labs  10/05/13 0435 10/06/13 0412  NA 138 141  K 4.7 3.9  CL 105 107  CO2 25 26  BUN 15 14  CREATININE 0.79 0.63  GLUCOSE 178* 106*  CALCIUM 8.1* 8.0*    Recent Labs  10/04/13 0540  INR 1.00    EXAM General - Patient is Alert, Appropriate and Oriented Extremity - Neurovascular intact Sensation intact distally Dorsiflexion/Plantar flexion intact Dressing/Incision - clean, dry Motor Function - intact, moving foot and toes well on exam.   Past Medical History  Diagnosis Date  . Complication of anesthesia     NAUSEA  . Chest pain     pt states due to esophageal problem -  primary care MD aware   . Hives     since pneumonia vaccine   . Asthma   . Depression   . Anxiety   . Fibromyalgia   . Spondylosis   . Bulging disc   . 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 Phantom limb pain     rt leg  . Leg pain, left   . Eczema     ears  . GERD (gastroesophageal reflux disease)     " alot of burping"  . Constipation, chronic   . Urgency incontinence   . History of transfusion   . Cancer     hx sarcoma rt knee with amputation  . Difficulty sleeping   . Sleep apnea     pt had recent sleep study done but does not know results - report called for and put on chart showing significant obstructive sleep apnea  . Hypertension   . Back pain 08/19/13    low back and  left leg pain due to nerve problems per pt  . PONV (postoperative nausea and vomiting)   . Peripheral neuropathy   . History of bronchitis   . History of pneumonia   . Hiatal hernia   . History of scarlet fever   . Postmenopausal   . History of measles   . History of mumps   . History of rubella     Assessment/Plan: 2 Days Post-Op Procedure(s) (LRB): LEFT HIP TOTAL REVISION (Left)  Active Problems:   Closed dislocation of hip  Estimated body mass index is 27.98 kg/(m^2) as calculated from the following:   Height as of this encounter: 5\' 2"  (1.575 m).   Weight as of this encounter: 69.4 kg (153 lb). Advance diet Bed to chair transfers  DVT Prophylaxis - Aspirin 325 mg  Toe Touch Weight Bearing ONLY to left leg. Bed to char transfers only for now   Hemovacs Left In Place **DRAINS ARE SEWN IN** Hip Preacutions  Arlee Muslim, PA-C Orthopaedic Surgery 10/06/2013, 6:24 PM

## 2013-10-06 NOTE — Evaluation (Signed)
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  Pt will benefit from follow up rehab at SNF level to maximize IND and safety prior to return home.    Follow Up Recommendations SNF    Equipment Recommendations  None recommended by PT    Recommendations for Other Services OT consult     Precautions / Restrictions Precautions Precautions: Posterior Hip Precaution Booklet Issued: Yes (comment) Precaution Comments: OK for ROM L LE Restrictions Weight Bearing Restrictions: Yes LLE Weight Bearing: Touchdown weight bearing Other Position/Activity Restrictions: bed to chair only      Mobility  Bed Mobility Overal bed mobility: Needs Assistance;+2 for physical assistance Bed Mobility: Supine to Sit;Sit to Supine     Supine to sit: Mod assist;+2 for physical assistance Sit to supine: Mod assist;+2 for physical assistance   General bed mobility comments: assist for LE management and to bring trunk up/down  Transfers                 General transfer comment: Sitting at EOB x 10 min - returned to supine for 2nd unit of transfusion  Ambulation/Gait                Stairs            Wheelchair Mobility    Modified Rankin (Stroke Patients Only)       Balance Overall balance assessment: Needs assistance Sitting-balance support: No upper extremity supported Sitting balance-Leahy Scale: Fair                                       Pertinent Vitals/Pain Pain Assessment: 0-10 Pain Score: 5  Pain Location: L hip Pain Descriptors / Indicators: Aching;Sore Pain Intervention(s): Limited activity within patient's tolerance;Monitored during session;Premedicated  before session;Ice applied    Home Living Family/patient expects to be discharged to:: Skilled nursing facility                      Prior Function Level of Independence: Needs assistance               Hand Dominance   Dominant Hand: Right    Extremity/Trunk Assessment   Upper Extremity Assessment: Overall WFL for tasks assessed           Lower Extremity Assessment: RLE deficits/detail;LLE deficits/detail RLE Deficits / Details: AKA LLE Deficits / Details: Hip strength 2/5 with AAROM at hip to 75 flex and 15 abd     Communication   Communication: No difficulties  Cognition Arousal/Alertness: Awake/alert Behavior During Therapy: WFL for tasks assessed/performed Overall Cognitive Status: Within Functional Limits for tasks assessed                      General Comments      Exercises Total Joint Exercises Ankle Circles/Pumps: AROM;Left;15 reps;Supine Quad Sets: AROM;Left;10 reps;Supine Heel Slides: AAROM;Left;Supine;15 reps Hip ABduction/ADduction: AAROM;Left;10 reps;Supine      Assessment/Plan    PT Assessment Patient needs continued PT services  PT Diagnosis Difficulty walking;Generalized weakness;Acute pain   PT Problem List Decreased strength;Decreased range of motion;Decreased activity tolerance;Decreased balance;Decreased mobility;Decreased knowledge of use of  DME;Decreased knowledge of precautions;Pain  PT Treatment Interventions DME instruction;Functional mobility training;Therapeutic activities;Therapeutic exercise;Balance training;Patient/family education;Wheelchair mobility training   PT Goals (Current goals can be found in the Care Plan section) Acute Rehab PT Goals Patient Stated Goal: get to the rehab so i can get home PT Goal Formulation: With patient/family Time For Goal Achievement: 09/23/13 Potential to Achieve Goals: Good    Frequency Min 5X/week   Barriers to discharge        Co-evaluation                End of Session   Activity Tolerance: Patient tolerated treatment well;Patient limited by fatigue Patient left: in bed;with call bell/phone within reach Nurse Communication: Mobility status         Time: 0925-0952 PT Time Calculation (min): 27 min   Charges:   PT Evaluation $Initial PT Evaluation Tier I: 1 Procedure PT Treatments $Therapeutic Exercise: 8-22 mins $Therapeutic Activity: 8-22 mins   PT G Codes:          Caulder Wehner 10/06/2013, 12:46 PM

## 2013-10-07 LAB — CBC
HCT: 29.7 % — ABNORMAL LOW (ref 36.0–46.0)
Hemoglobin: 9.8 g/dL — ABNORMAL LOW (ref 12.0–15.0)
MCH: 29.7 pg (ref 26.0–34.0)
MCHC: 33 g/dL (ref 30.0–36.0)
MCV: 90 fL (ref 78.0–100.0)
PLATELETS: 253 10*3/uL (ref 150–400)
RBC: 3.3 MIL/uL — AB (ref 3.87–5.11)
RDW: 15.3 % (ref 11.5–15.5)
WBC: 11.6 10*3/uL — AB (ref 4.0–10.5)

## 2013-10-07 LAB — TYPE AND SCREEN
ABO/RH(D): O POS
Antibody Screen: NEGATIVE
UNIT DIVISION: 0
UNIT DIVISION: 0
Unit division: 0
Unit division: 0

## 2013-10-07 LAB — GLUCOSE, CAPILLARY: GLUCOSE-CAPILLARY: 110 mg/dL — AB (ref 70–99)

## 2013-10-07 MED ORDER — SODIUM CHLORIDE 0.9 % IV SOLN: INTRAVENOUS | Status: DC

## 2013-10-07 NOTE — Progress Notes (Signed)
CARE MANAGEMENT NOTE 10/07/2013  Patient:  [name], [name]   Account Number:  [id number]  Date Initiated:  10/03/2013  Documentation initiated by:  [facility]  Subjective/Objective Assessment:   CLOSED REDUCTION HIP and dressing change  (Left)     Action/Plan:   SNF   Anticipated DC Date:  10/10/2013   Anticipated DC Plan:  SKILLED NURSING FACILITY  In-house referral  Clinical Social Worker      DC Planning Services  CM consult      Choice offered to / List presented to:             Status of service:  Completed, signed off Medicare Important Message given?   (If response is "NO", the following Medicare IM given date fields will be blank) Date Medicare IM given:   Medicare IM given by:   Date Additional Medicare IM given:   Additional Medicare IM given by:    Discharge Disposition:  [facility]  Per UR Regulation:  Reviewed for med. necessity/level of care/duration of stay  If discussed at [place] of Stay Meetings, dates discussed:   10/07/2013    Comments:  [id number] Rosana Hoes ,RN ,BSN, CCM Discharge needs and chart reviewed  10/03/2013 1330 Plan dc to SNF. Jonnie Finner RN CCM Case Mgmt phone [phone]

## 2013-10-07 NOTE — Progress Notes (Signed)
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    PT Comments      Follow Up Recommendations  SNF     Equipment Recommendations  None recommended by PT    Recommendations for Other Services OT consult     Precautions / Restrictions Precautions Precautions: Posterior Hip Precaution Booklet Issued: Yes (comment) Precaution Comments: OK for ROM L LE Restrictions Weight Bearing Restrictions: Yes LLE Weight Bearing: Touchdown weight bearing Other Position/Activity Restrictions: bed to chair only    Mobility  Bed Mobility Overal bed mobility: Needs Assistance Bed Mobility: Sit to Supine       Sit to supine: Min assist   General bed mobility comments: cues for sequence with assist to manage L LE  Transfers Overall transfer level: Needs assistance   Transfers: Lateral/Scoot Transfers          Lateral/Scoot Transfers: Mod assist General transfer comment: Slide board, cues for sequencing and positioning on board, physical assist for balance and to complete transfer  Ambulation/Gait                 Stairs            Wheelchair Mobility    Modified Rankin (Stroke Patients Only)       Balance                                    Cognition Arousal/Alertness: Awake/alert Behavior During Therapy: WFL for tasks assessed/performed Overall Cognitive Status: Within Functional Limits for tasks assessed                      Exercises      General Comments        Pertinent Vitals/Pain Pain Assessment: 0-10 Pain Score: 4  Pain Location: L hip Pain Descriptors / Indicators: Aching Pain Intervention(s): Limited activity within patient's tolerance;Monitored during session;Ice applied    Home Living                      Prior Function            PT Goals (current goals  can now be found in the care plan section) Acute Rehab PT Goals Patient Stated Goal: get to the rehab so i can get home PT Goal Formulation: With patient/family Time For Goal Achievement: 09/23/13 Potential to Achieve Goals: Good Progress towards PT goals: Progressing toward goals    Frequency  Min 5X/week    PT Plan Current plan remains appropriate    Co-evaluation             End of Session Equipment Utilized During Treatment: Gait belt;Other (comment) (slide board) Activity Tolerance: Patient tolerated treatment well Patient left: in bed;with call bell/phone within reach     Time: 7342-8768 PT Time Calculation (min): 9 min  Charges:  $Therapeutic Activity: 8-22 mins                    G Codes:      Flannery Cavallero 11-04-13, 5:25 PM

## 2013-10-07 NOTE — Progress Notes (Signed)
Subjective: 3 Days Post-Op Procedure(s) (LRB): LEFT HIP TOTAL REVISION (Left) Patient reports pain as mild.   Patient seen in rounds by Dr. Wynelle Link. Patient is well, but has had some minor complaints of pain in the hip and thigh, requiring pain medications Plan is to go Skilled nursing facility after hospital stay.  Objective: Vital signs in last 24 hours: Temp:  [97.8 F (36.6 C)-99.6 F (37.6 C)] 97.8 F (36.6 C) (09/24 0547) Pulse Rate:  [85-99] 85 (09/24 0547) Resp:  [14-18] 15 (09/24 0547) BP: (89-119)/(42-61) 108/58 mmHg (09/24 0547) SpO2:  [95 %-97 %] 96 % (09/24 0547)  Intake/Output from previous day:  Intake/Output Summary (Last 24 hours) at 10/07/13 0806 Last data filed at 10/07/13 0550  Gross per 24 hour  Intake   2249 ml  Output   3370 ml  Net  -1121 ml    Intake/Output this shift: UOP 1000 HV Output - 130 past 24 hours  Labs:  Recent Labs  10/04/13 1953 10/05/13 0435 10/06/13 0412 10/07/13 0430  HGB 10.4* 9.6* 6.5* 9.8*    Recent Labs  10/06/13 0412 10/07/13 0430  WBC 13.3* 11.6*  RBC 2.17* 3.30*  HCT 20.0* 29.7*  PLT 249 253    Recent Labs  10/05/13 0435 10/06/13 0412  NA 138 141  K 4.7 3.9  CL 105 107  CO2 25 26  BUN 15 14  CREATININE 0.79 0.63  GLUCOSE 178* 106*  CALCIUM 8.1* 8.0*   No results found for this basename: LABPT, INR,  in the last 72 hours  EXAM General - Patient is Alert and Appropriate Extremity - Neurovascular intact Sensation intact distally Dorsiflexion/Plantar flexion intact Dressing/Incision - clean, dry, no drainage Drains with continued output so left in place Motor Function - intact, moving foot and toes well on exam.   Past Medical History  Diagnosis Date  . Complication of anesthesia     NAUSEA  . Chest pain     pt states due to esophageal problem -  primary care MD aware   . Hives     since pneumonia vaccine   . Asthma   . Depression   . Anxiety   . Fibromyalgia   . Spondylosis   .  Bulging disc   . 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 Phantom limb pain     rt leg  . Leg pain, left   . Eczema     ears  . GERD (gastroesophageal reflux disease)     " alot of burping"  . Constipation, chronic   . Urgency incontinence   . History of transfusion   . Cancer     hx sarcoma rt knee with amputation  . Difficulty sleeping   . Sleep apnea     pt had recent sleep study done but does not know results - report called for and put on chart showing significant obstructive sleep apnea  . Hypertension   . Back pain 08/19/13    low back and  left leg pain due to nerve problems per pt  . PONV (postoperative nausea and vomiting)   . Peripheral neuropathy   . History of bronchitis   . History of pneumonia   . Hiatal hernia   . History of scarlet fever   . Postmenopausal   . History of measles   . History of mumps   . History of rubella     Assessment/Plan: 3 Days Post-Op Procedure(s) (  LRB): LEFT HIP TOTAL REVISION (Left) Active Problems:   Postoperative anemia due to acute blood loss   History of transfusion   Closed dislocation of hip  Estimated body mass index is 27.98 kg/(m^2) as calculated from the following:   Height as of this encounter: 5\' 2"  (1.575 m).   Weight as of this encounter: 69.4 kg (153 lb).  Bed to chair transfers   DVT Prophylaxis - Aspirin 325 mg  Toe Touch Weight Bearing ONLY to left leg. Bed to char transfers only for now   Hemovacs Left In Place **DRAINS ARE SEWN IN**  Hip Preacutions  Arlee Muslim, PA-C Orthopaedic Surgery 10/07/2013, 8:06 AM

## 2013-10-07 NOTE — Progress Notes (Signed)
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    PT Comments    Pt in good spirits and progressed to slide board transfer and wc propulsion this date  Follow Up Recommendations  SNF     Equipment Recommendations  None recommended by PT    Recommendations for Other Services OT consult     Precautions / Restrictions Precautions Precautions: Posterior Hip Precaution Booklet Issued: Yes (comment) Precaution Comments: OK for ROM L LE Restrictions Weight Bearing Restrictions: Yes LLE Weight Bearing: Touchdown weight bearing Other Position/Activity Restrictions: bed to chair only    Mobility  Bed Mobility Overal bed mobility: Needs Assistance Bed Mobility: Supine to Sit     Supine to sit: Min assist     General bed mobility comments: cues for sequence with assist to manage L LE  Transfers Overall transfer level: Needs assistance   Transfers: Lateral/Scoot Transfers          Lateral/Scoot Transfers: Mod assist General transfer comment: Slide board, cues for sequencing and positioning on board, physical assist for balance and to complete transfer  Ambulation/Gait                 Hotel manager mobility: Yes Wheelchair propulsion: Both upper extremities Wheelchair parts: Supervision/cueing Distance: Banks Details (indicate cue type and reason): cues for turning, supervision for IV line  Modified Rankin (Stroke Patients Only)       Balance                                    Cognition Arousal/Alertness: Awake/alert Behavior During Therapy: WFL for tasks assessed/performed Overall Cognitive Status: Within Functional Limits for tasks assessed                      Exercises Total Joint Exercises Ankle  Circles/Pumps: AROM;Left;15 reps;Supine Quad Sets: AROM;Left;10 reps;Supine Heel Slides: AAROM;Left;Supine;15 reps Hip ABduction/ADduction: AAROM;Left;Supine;15 reps    General Comments        Pertinent Vitals/Pain Pain Assessment: 0-10 Pain Score: 4  Pain Location: L hip Pain Descriptors / Indicators: Aching;Sore Pain Intervention(s): Limited activity within patient's tolerance;Monitored during session;Premedicated before session;Ice applied    Home Living                      Prior Function            PT Goals (current goals can now be found in the care plan section) Acute Rehab PT Goals Patient Stated Goal: get to the rehab so i can get home PT Goal Formulation: With patient/family Time For Goal Achievement: 09/23/13 Potential to Achieve Goals: Good Progress towards PT goals: Progressing toward goals    Frequency  Min 5X/week    PT Plan Current plan remains appropriate    Co-evaluation             End of Session Equipment Utilized During Treatment: Gait belt;Other (comment) (slide board) Activity Tolerance: Patient tolerated treatment well Patient left: in chair;with call bell/phone within reach     Time: 1213-1251 PT Time Calculation (min): 38 min  Charges:  $Therapeutic Exercise: 8-22 mins $Therapeutic Activity: 8-22 mins $Wheel Chair Management: 8-22 mins  G Codes:      Luverne Zerkle 24-Oct-2013, 1:18 PM

## 2013-10-08 LAB — CBC
HEMATOCRIT: 28.6 % — AB (ref 36.0–46.0)
HEMOGLOBIN: 9.3 g/dL — AB (ref 12.0–15.0)
MCH: 30 pg (ref 26.0–34.0)
MCHC: 32.5 g/dL (ref 30.0–36.0)
MCV: 92.3 fL (ref 78.0–100.0)
Platelets: 262 10*3/uL (ref 150–400)
RBC: 3.1 MIL/uL — AB (ref 3.87–5.11)
RDW: 15 % (ref 11.5–15.5)
WBC: 10.3 10*3/uL (ref 4.0–10.5)

## 2013-10-08 MED ORDER — LORAZEPAM 0.5 MG PO TABS: 0.5000 mg | ORAL_TABLET | Freq: Two times a day (BID) | ORAL | Status: DC | PRN

## 2013-10-08 MED ORDER — CYCLOBENZAPRINE HCL 10 MG PO TABS: 10.0000 mg | ORAL_TABLET | Freq: Two times a day (BID) | ORAL | Status: AC

## 2013-10-08 MED ORDER — ASPIRIN 325 MG PO TBEC: 325.0000 mg | DELAYED_RELEASE_TABLET | Freq: Every day | ORAL | Status: AC

## 2013-10-08 MED ORDER — HEPARIN SOD (PORK) LOCK FLUSH 100 UNIT/ML IV SOLN
250.0000 [IU] | INTRAVENOUS | Status: AC | PRN
  Administered 2013-10-08: 250 [IU]

## 2013-10-08 MED ORDER — METHOCARBAMOL 500 MG PO TABS: 500.0000 mg | ORAL_TABLET | Freq: Four times a day (QID) | ORAL | Status: DC | PRN

## 2013-10-08 MED ORDER — HYDROMORPHONE HCL 2 MG PO TABS: 2.0000 mg | ORAL_TABLET | ORAL | Status: AC | PRN

## 2013-10-08 MED ORDER — ACETAMINOPHEN 650 MG RE SUPP: 650.0000 mg | Freq: Four times a day (QID) | RECTAL | Status: DC | PRN

## 2013-10-08 NOTE — Progress Notes (Signed)
Pt has been d/c to [facility], as planned , via [facility] transport ( required ). Pt contacted her family informing them of d/c plans. SNF received prior authorization for placement. NSG reviewed d/c summary, scripts, avs. Scripts included in d/c packet.  Roselyn Reef Naraly Fritcher LCSW [phone]

## 2013-10-08 NOTE — Discharge Instructions (Addendum)
Take 325 mg Aspirin daily for three weeks and then switch over to an 81 mg Aspirin daily.  Touch Down Weight Bearing ONLY To Left Leg  Do Not Advance Weight Bearing Status To Left Leg  May start showering.  Do not submerge incision under water.  Bed to Chair Transfers ONLY

## 2013-10-08 NOTE — Progress Notes (Signed)
Subjective: 4 Days Post-Op Procedure(s) (LRB): LEFT HIP TOTAL REVISION (Left) Patient reports pain as mild.   Patient seen in rounds by Dr. Wynelle Link. Patient is well, but has had some minor complaints of pain in the hip, requiring pain medications Patient is ready to go to the SNF - [facility]  Objective: Vital signs in last 24 hours: Temp:  [98.1 F (36.7 C)-98.7 F (37.1 C)] 98.6 F (37 C) (09/25 0506) Pulse Rate:  [86-95] 86 (09/25 0506) Resp:  [14] 14 (09/25 0506) BP: (102-118)/(55-57) 102/55 mmHg (09/25 0506) SpO2:  [94 %-97 %] 94 % (09/25 0506)  Intake/Output from previous day:  Intake/Output Summary (Last 24 hours) at 10/08/13 1313 Last data filed at 10/08/13 0940  Gross per 24 hour  Intake   1015 ml  Output   3257 ml  Net  -2242 ml    Intake/Output this shift: Total I/O In: 240 [P.O.:240] Out: 545 [Urine:500; Drains:45]  Labs:  Recent Labs  10/06/13 0412 10/07/13 0430 10/08/13 0425  HGB 6.5* 9.8* 9.3*    Recent Labs  10/07/13 0430 10/08/13 0425  WBC 11.6* 10.3  RBC 3.30* 3.10*  HCT 29.7* 28.6*  PLT 253 262    Recent Labs  10/06/13 0412  NA 141  K 3.9  CL 107  CO2 26  BUN 14  CREATININE 0.63  GLUCOSE 106*  CALCIUM 8.0*   No results found for this basename: LABPT, INR,  in the last 72 hours  EXAM: General - Patient is Alert, Appropriate and Oriented Extremity - Neurovascular intact Sensation intact distally Incision - clean, dry, healing Motor Function - intact, moving foot and toes well on exam.   Assessment/Plan: 4 Days Post-Op Procedure(s) (LRB): LEFT HIP TOTAL REVISION (Left) Procedure(s) (LRB): LEFT HIP TOTAL REVISION (Left) Past Medical History  Diagnosis Date  . Complication of anesthesia     NAUSEA  . Chest pain     pt states due to esophageal problem -  primary care MD aware   . Hives     since pneumonia vaccine   . Asthma   . Depression   . Anxiety   . Fibromyalgia   . Spondylosis   . Bulging disc   . 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 Phantom limb pain     rt leg  . Leg pain, left   . Eczema     ears  . GERD (gastroesophageal reflux disease)     " alot of burping"  . Constipation, chronic   . Urgency incontinence   . History of transfusion   . Cancer     hx sarcoma rt knee with amputation  . Difficulty sleeping   . Sleep apnea     pt had recent sleep study done but does not know results - report called for and put on chart showing significant obstructive sleep apnea  . Hypertension   . Back pain 08/19/13    low back and  left leg pain due to nerve problems per pt  . PONV (postoperative nausea and vomiting)   . Peripheral neuropathy   . History of bronchitis   . History of pneumonia   . Hiatal hernia   . History of scarlet fever   . Postmenopausal   . History of measles   . History of mumps   . History of rubella    Active Problems:   Postoperative anemia due to acute blood loss  History of transfusion   Closed dislocation of hip  Estimated body mass index is 27.98 kg/(m^2) as calculated from the following:   Height as of this encounter: 5\' 2"  (1.575 m).   Weight as of this encounter: 69.4 kg (153 lb). Discharge to SNF Diet - Regular diet Follow up - in 4 days Activity - Toe Touch Weight Bearing ONLY to left leg. Bed to char transfers only for now   Disposition - Skilled nursing facility Condition Upon Discharge - Stable D/C Meds - See DC Summary DVT Prophylaxis - Aspirin 325 mg   Arlee Muslim, PA-C Orthopaedic Surgery 10/08/2013, 1:13 PM

## 2013-10-08 NOTE — Progress Notes (Signed)
Discharged to [facility], assessment unchanged. Attempted to call report to facility, call transferred and no answer for multiple rings.  Bethann Punches RN

## 2013-10-08 NOTE — Progress Notes (Signed)
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    PT Comments      Follow Up Recommendations  SNF     Equipment Recommendations  None recommended by PT    Recommendations for Other Services OT consult     Precautions / Restrictions Precautions Precautions: Posterior Hip Precaution Booklet Issued: Yes (comment) Precaution Comments: OK for ROM L LE Restrictions Weight Bearing Restrictions: Yes LLE Weight Bearing: Touchdown weight bearing Other Position/Activity Restrictions: bed to chair only    Mobility  Bed Mobility Overal bed mobility: Needs Assistance Bed Mobility: Sit to Supine     Supine to sit: Min assist Sit to supine: Min assist   General bed mobility comments: cues for sequence with assist to manage L LE  Transfers Overall transfer level: Needs assistance   Transfers: Lateral/Scoot Transfers          Lateral/Scoot Transfers: Min assist;Mod assist;With slide board General transfer comment: Slide board, cues for sequencing and positioning on board, physical assist for balance and to complete transfer  Ambulation/Gait                 Hotel manager mobility: Yes Wheelchair propulsion: Both upper extremities Wheelchair parts: Independent Distance: 60 Wheelchair Assistance Details (indicate cue type and reason): Assist for IV pole  Modified Rankin (Stroke Patients Only)       Balance                                    Cognition Arousal/Alertness: Awake/alert Behavior During Therapy: WFL for tasks assessed/performed Overall Cognitive Status: Within Functional Limits for tasks assessed                      Exercises Total Joint Exercises Ankle Circles/Pumps: AROM;Left;15 reps;Supine Quad Sets: AROM;Left;10  reps;Supine Gluteal Sets: AROM;Both;10 reps;Supine Heel Slides: AAROM;Left;Supine;20 reps Hip ABduction/ADduction: AAROM;Left;Supine;20 reps    General Comments        Pertinent Vitals/Pain Pain Assessment: 0-10 Pain Score: 6  Pain Location: L hip Pain Intervention(s): Limited activity within patient's tolerance;Monitored during session;Patient requesting pain meds-RN notified;Ice applied    Home Living                      Prior Function            PT Goals (current goals can now be found in the care plan section) Acute Rehab PT Goals Patient Stated Goal: get to the rehab so i can get home PT Goal Formulation: With patient/family Time For Goal Achievement: 09/23/13 Potential to Achieve Goals: Good Progress towards PT goals: Progressing toward goals    Frequency  Min 5X/week    PT Plan Current plan remains appropriate    Co-evaluation             End of Session Equipment Utilized During Treatment: Gait belt;Other (comment) Activity Tolerance: Patient tolerated treatment well Patient left: in bed;with call bell/phone within reach     Time: 1320-1332 PT Time Calculation (min): 12 min  Charges:  $Therapeutic Exercise: 8-22 mins $Therapeutic Activity: 8-22 mins                    G Codes:  [name] 10/08/2013, 4:50 PM

## 2013-10-08 NOTE — Progress Notes (Signed)
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    PT Comments      Follow Up Recommendations  SNF     Equipment Recommendations  None recommended by PT    Recommendations for Other Services OT consult     Precautions / Restrictions Precautions Precautions: Posterior Hip Precaution Booklet Issued: Yes (comment) Precaution Comments: OK for ROM L LE Restrictions Weight Bearing Restrictions: Yes LLE Weight Bearing: Touchdown weight bearing Other Position/Activity Restrictions: bed to chair only    Mobility  Bed Mobility Overal bed mobility: Needs Assistance Bed Mobility: Supine to Sit     Supine to sit: Min assist     General bed mobility comments: cues for sequence with assist to manage L LE  Transfers Overall transfer level: Needs assistance   Transfers: Lateral/Scoot Transfers          Lateral/Scoot Transfers: Min assist;Mod assist;With slide board General transfer comment: Slide board, cues for sequencing and positioning on board, physical assist for balance and to complete transfer  Ambulation/Gait                 Hotel manager mobility: Yes Wheelchair propulsion: Both upper extremities Wheelchair parts: Independent Distance: 76 Wheelchair Assistance Details (indicate cue type and reason): Assist for IV pole  Modified Rankin (Stroke Patients Only)       Balance                                    Cognition Arousal/Alertness: Awake/alert Behavior During Therapy: WFL for tasks assessed/performed Overall Cognitive Status: Within Functional Limits for tasks assessed                      Exercises Total Joint Exercises Ankle Circles/Pumps: AROM;Left;15 reps;Supine Quad Sets: AROM;Left;10 reps;Supine Gluteal  Sets: AROM;Both;10 reps;Supine Heel Slides: AAROM;Left;Supine;20 reps Hip ABduction/ADduction: AAROM;Left;Supine;20 reps    General Comments        Pertinent Vitals/Pain Pain Assessment: 0-10 Pain Score: 4  Pain Location: L hip Pain Intervention(s): Limited activity within patient's tolerance;Monitored during session;Premedicated before session;Ice applied    Home Living                      Prior Function            PT Goals (current goals can now be found in the care plan section) Acute Rehab PT Goals Patient Stated Goal: get to the rehab so i can get home PT Goal Formulation: With patient/family Time For Goal Achievement: 09/23/13 Potential to Achieve Goals: Good Progress towards PT goals: Progressing toward goals    Frequency  Min 5X/week    PT Plan Current plan remains appropriate    Co-evaluation             End of Session Equipment Utilized During Treatment: Gait belt;Other (comment) Activity Tolerance: Patient tolerated treatment well Patient left: in chair;with call bell/phone within reach     Time: 1138-1215 PT Time Calculation (min): 37 min  Charges:  $Therapeutic Exercise: 8-22 mins $Therapeutic Activity: 8-22 mins                    G Codes:  Shazia Mitchener 10/08/2013, 4:47 PM

## 2013-10-08 NOTE — Progress Notes (Signed)
CSW assisting with d/c planning. [facility] has received authorization for placement from [facility]. SNF is able to admit pt when stable for d/c. SNF accepts weekend admissions. CSW will continue to follow to assist with d/c planning to SNF.  Werner Lean LCSW [phone]

## 2013-10-08 NOTE — Discharge Summary (Signed)
Physician Discharge Summary   Patient ID: [name] MRN: [id number] DOB/AGE: 02-03-1950 63 y.o.  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 Complication of anesthesia     NAUSEA  . Chest pain     pt states due to esophageal problem -  primary care MD aware   . Hives     since pneumonia vaccine   . Asthma   . Depression   . Anxiety   . Fibromyalgia   . Spondylosis   . Bulging disc   . 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 Phantom limb pain     rt leg  . Leg pain, left   . Eczema     ears  . GERD (gastroesophageal reflux disease)     " alot of burping"  . Constipation, chronic   . Urgency incontinence   . History of transfusion   . Cancer     hx sarcoma rt knee with amputation  . Difficulty sleeping   . Sleep apnea     pt had recent sleep study done but does not know results - report called for and put on chart showing significant obstructive sleep apnea  . Hypertension   . Back pain 08/19/13    low back and  left leg pain due to nerve problems per pt  . PONV (postoperative nausea and vomiting)   . Peripheral neuropathy   . History of bronchitis   . History of pneumonia   . Hiatal hernia   . History of scarlet fever   . Postmenopausal   . History of measles   . History of mumps   . History of rubella    Discharge Diagnoses:   Active Problems:   Postoperative anemia due to acute blood loss   History of transfusion   Closed dislocation of hip  Estimated body mass index is 27.98 kg/(m^2) as calculated from the following:   Height as of this encounter: '5\' 2"'  (1.575 m).   Weight as of this encounter: 69.4 kg (153 lb).  Procedure(s) (LRB): #1 - Attempted left closed hip reduction with dressing  changes to the left hip - Dr. Netta Cedars  # 2 - LEFT TOTAL HIP REVISION  (Left) - Dr. Gaynelle Arabian   Consults: None  HPI: [name] is a 63 year old female with a very complex  history in regards to this left hip. She has had multiple procedures  since July due to initial total hip arthroplasty, then periprosthetic  fracture x2. She presents now with a dislocated hip that could not be  closed reduced. She presents now for open reduction and conversion to a  constrained liner.  Laboratory Data: Admission on 10/01/2013  Component Date Value Ref Range Status  . WBC 10/02/2013 10.0  4.0 - 10.5 K/uL Final  . RBC 10/02/2013 3.16* 3.87 - 5.11 MIL/uL Final  . Hemoglobin 10/02/2013 9.2* 12.0 - 15.0 g/dL Final  . HCT 10/02/2013 29.4* 36.0 - 46.0 % Final  . MCV 10/02/2013 93.0  78.0 - 100.0 fL Final  . MCH 10/02/2013 29.1  26.0 - 34.0 pg Final  . MCHC 10/02/2013 31.3  30.0 - 36.0 g/dL Final  . RDW 10/02/2013 14.2  11.5 - 15.5 % Final  . Platelets 10/02/2013 454* 150 - 400 K/uL Final  . Sodium 10/02/2013 144  137 - 147 mEq/L Final  . Potassium 10/02/2013 3.9  3.7 - 5.3 mEq/L Final  . Chloride 10/02/2013 106  96 - 112 mEq/L Final  . CO2 10/02/2013 28  19 - 32 mEq/L Final  . Glucose, Bld 10/02/2013 99  70 - 99 mg/dL Final  . BUN 10/02/2013 12  6 - 23 mg/dL Final  . Creatinine, Ser 10/02/2013 0.56  0.50 - 1.10 mg/dL Final  . Calcium 10/02/2013 8.8  8.4 - 10.5 mg/dL Final  . GFR calc non Af Amer 10/02/2013 >90  >90 mL/min Final  . GFR calc Af Amer 10/02/2013 >90  >90 mL/min Final   Comment: (NOTE)                          The eGFR has been calculated using the CKD EPI equation.                          This calculation has not been validated in all clinical situations.                          eGFR's persistently <90 mL/min signify possible Chronic Kidney                          Disease.  . Anion gap 10/02/2013 10  5 - 15 Final  . WBC 10/03/2013 6.4  4.0 - 10.5 K/uL Final  . RBC 10/03/2013 2.94* 3.87 - 5.11 MIL/uL Final  . Hemoglobin 10/03/2013 8.6* 12.0 - 15.0  g/dL Final  . HCT 10/03/2013 27.2* 36.0 - 46.0 % Final  . MCV 10/03/2013 92.5  78.0 - 100.0 fL Final  . MCH 10/03/2013 29.3  26.0 - 34.0 pg Final  . MCHC 10/03/2013 31.6  30.0 - 36.0 g/dL Final  . RDW 10/03/2013 14.6  11.5 - 15.5 % Final  . Platelets 10/03/2013 399  150 - 400 K/uL Final  . Sodium 10/03/2013 141  137 - 147 mEq/L Final  . Potassium 10/03/2013 3.7  3.7 - 5.3 mEq/L Final  . Chloride 10/03/2013 104  96 - 112 mEq/L Final  . CO2 10/03/2013 28  19 - 32 mEq/L Final  . Glucose, Bld 10/03/2013 112* 70 - 99 mg/dL Final  . BUN 10/03/2013 14  6 - 23 mg/dL Final  . Creatinine, Ser 10/03/2013 0.63  0.50 - 1.10 mg/dL Final  . Calcium 10/03/2013 8.5  8.4 - 10.5 mg/dL Final  . GFR calc non Af Amer 10/03/2013 >90  >90 mL/min Final  . GFR calc Af Amer 10/03/2013 >90  >90 mL/min Final   Comment: (NOTE)                          The eGFR has been calculated using the CKD EPI equation.                          This calculation has not been validated in all clinical situations.                          eGFR's persistently <90 mL/min signify possible Chronic Kidney                          Disease.  [name]  gap 10/03/2013 9  5 - 15 Final  . WBC 10/04/2013 7.6  4.0 - 10.5 K/uL Final  . RBC 10/04/2013 3.19* 3.87 - 5.11 MIL/uL Final  . Hemoglobin 10/04/2013 9.3* 12.0 - 15.0 g/dL Final  . HCT 10/04/2013 30.1* 36.0 - 46.0 % Final  . MCV 10/04/2013 94.4  78.0 - 100.0 fL Final  . MCH 10/04/2013 29.2  26.0 - 34.0 pg Final  . MCHC 10/04/2013 30.9  30.0 - 36.0 g/dL Final  . RDW 10/04/2013 14.6  11.5 - 15.5 % Final  . Platelets 10/04/2013 439* 150 - 400 K/uL Final  . Sodium 10/04/2013 142  137 - 147 mEq/L Final  . Potassium 10/04/2013 3.9  3.7 - 5.3 mEq/L Final  . Chloride 10/04/2013 106  96 - 112 mEq/L Final  . CO2 10/04/2013 28  19 - 32 mEq/L Final  . Glucose, Bld 10/04/2013 93  70 - 99 mg/dL Final  . BUN 10/04/2013 12  6 - 23 mg/dL Final  . Creatinine, Ser 10/04/2013 0.62  0.50 - 1.10 mg/dL Final   . Calcium 10/04/2013 8.7  8.4 - 10.5 mg/dL Final  . Total Protein 10/04/2013 6.5  6.0 - 8.3 g/dL Final  . Albumin 10/04/2013 2.9* 3.5 - 5.2 g/dL Final  . AST 10/04/2013 25  0 - 37 U/L Final  . ALT 10/04/2013 15  0 - 35 U/L Final  . Alkaline Phosphatase 10/04/2013 268* 39 - 117 U/L Final  . Total Bilirubin 10/04/2013 <0.2* 0.3 - 1.2 mg/dL Final  . GFR calc non Af Amer 10/04/2013 >90  >90 mL/min Final  . GFR calc Af Amer 10/04/2013 >90  >90 mL/min Final   Comment: (NOTE)                          The eGFR has been calculated using the CKD EPI equation.                          This calculation has not been validated in all clinical situations.                          eGFR's persistently <90 mL/min signify possible Chronic Kidney                          Disease.  . Anion gap 10/04/2013 8  5 - 15 Final  . Prothrombin Time 10/04/2013 13.2  11.6 - 15.2 seconds Final  . INR 10/04/2013 1.00  0.00 - 1.49 Final  . ABO/RH(D) 10/04/2013 O POS   Final  . Antibody Screen 10/04/2013 NEG   Final  . Sample Expiration 10/04/2013 10/07/2013   Final  . Unit Number 10/04/2013 [id number]   Final  . Blood Component Type 10/04/2013 RED CELLS,LR   Final  . Unit division 10/04/2013 00   Final  . Status of Unit 10/04/2013 ISSUED,FINAL   Final  . Transfusion Status 10/04/2013 OK TO TRANSFUSE   Final  . Crossmatch Result 10/04/2013 Compatible   Final  . Unit Number 10/04/2013 [id number]   Final  . Blood Component Type 10/04/2013 RED CELLS,LR   Final  . Unit division 10/04/2013 00   Final  . Status of Unit 10/04/2013 ISSUED,FINAL   Final  . Transfusion Status 10/04/2013 OK TO TRANSFUSE   Final  . Crossmatch Result 10/04/2013 Compatible   Final  . Unit Number  10/04/2013 [id number]   Final  . Blood Component Type 10/04/2013 RED CELLS,LR   Final  . Unit division 10/04/2013 00   Final  . Status of Unit 10/04/2013 ISSUED,FINAL   Final  . Transfusion Status 10/04/2013 OK TO TRANSFUSE   Final  .  Crossmatch Result 10/04/2013 Compatible   Final  . Unit Number 10/04/2013 [id number]   Final  . Blood Component Type 10/04/2013 RED CELLS,LR   Final  . Unit division 10/04/2013 00   Final  . Status of Unit 10/04/2013 ISSUED,FINAL   Final  . Transfusion Status 10/04/2013 OK TO TRANSFUSE   Final  . Crossmatch Result 10/04/2013 Compatible   Final  . Order Confirmation 10/04/2013 ORDER PROCESSED BY BLOOD BANK   Final  . WBC 10/05/2013 16.6* 4.0 - 10.5 K/uL Final  . RBC 10/05/2013 3.17* 3.87 - 5.11 MIL/uL Final  . Hemoglobin 10/05/2013 9.6* 12.0 - 15.0 g/dL Final  . HCT 10/05/2013 28.5* 36.0 - 46.0 % Final  . MCV 10/05/2013 89.9  78.0 - 100.0 fL Final  . MCH 10/05/2013 30.3  26.0 - 34.0 pg Final  . MCHC 10/05/2013 33.7  30.0 - 36.0 g/dL Final  . RDW 10/05/2013 15.6* 11.5 - 15.5 % Final  . Platelets 10/05/2013 344  150 - 400 K/uL Final  . WBC 10/04/2013 22.7* 4.0 - 10.5 K/uL Final  . RBC 10/04/2013 3.52* 3.87 - 5.11 MIL/uL Final  . Hemoglobin 10/04/2013 10.4* 12.0 - 15.0 g/dL Final  . HCT 10/04/2013 31.6* 36.0 - 46.0 % Final  . MCV 10/04/2013 89.8  78.0 - 100.0 fL Final  . MCH 10/04/2013 29.5  26.0 - 34.0 pg Final  . MCHC 10/04/2013 32.9  30.0 - 36.0 g/dL Final  . RDW 10/04/2013 14.9  11.5 - 15.5 % Final  . Platelets 10/04/2013 349  150 - 400 K/uL Final  . Sodium 10/05/2013 138  137 - 147 mEq/L Final  . Potassium 10/05/2013 4.7  3.7 - 5.3 mEq/L Final   Comment: DELTA CHECK NOTED                          NO VISIBLE HEMOLYSIS  . Chloride 10/05/2013 105  96 - 112 mEq/L Final  . CO2 10/05/2013 25  19 - 32 mEq/L Final  . Glucose, Bld 10/05/2013 178* 70 - 99 mg/dL Final  . BUN 10/05/2013 15  6 - 23 mg/dL Final  . Creatinine, Ser 10/05/2013 0.79  0.50 - 1.10 mg/dL Final  . Calcium 10/05/2013 8.1* 8.4 - 10.5 mg/dL Final  . GFR calc non Af Amer 10/05/2013 87* >90 mL/min Final  . GFR calc Af Amer 10/05/2013 >90  >90 mL/min Final   Comment: (NOTE)                          The eGFR has been  calculated using the CKD EPI equation.                          This calculation has not been validated in all clinical situations.                          eGFR's persistently <90 mL/min signify possible Chronic Kidney                          Disease.  [name]  gap 10/05/2013 8  5 - 15 Final  . Sodium 10/06/2013 141  137 - 147 mEq/L Final  . Potassium 10/06/2013 3.9  3.7 - 5.3 mEq/L Final   Comment: DELTA CHECK NOTED                          REPEATED TO VERIFY                          NO VISIBLE HEMOLYSIS  . Chloride 10/06/2013 107  96 - 112 mEq/L Final  . CO2 10/06/2013 26  19 - 32 mEq/L Final  . Glucose, Bld 10/06/2013 106* 70 - 99 mg/dL Final  . BUN 10/06/2013 14  6 - 23 mg/dL Final  . Creatinine, Ser 10/06/2013 0.63  0.50 - 1.10 mg/dL Final  . Calcium 10/06/2013 8.0* 8.4 - 10.5 mg/dL Final  . GFR calc non Af Amer 10/06/2013 >90  >90 mL/min Final  . GFR calc Af Amer 10/06/2013 >90  >90 mL/min Final   Comment: (NOTE)                          The eGFR has been calculated using the CKD EPI equation.                          This calculation has not been validated in all clinical situations.                          eGFR's persistently <90 mL/min signify possible Chronic Kidney                          Disease.  . Anion gap 10/06/2013 8  5 - 15 Final  . WBC 10/06/2013 13.3* 4.0 - 10.5 K/uL Final  . RBC 10/06/2013 2.17* 3.87 - 5.11 MIL/uL Final  . Hemoglobin 10/06/2013 6.5* 12.0 - 15.0 g/dL Final   Comment: DELTA CHECK NOTED                          REPEATED TO VERIFY                          CRITICAL RESULT CALLED TO, READ BACK BY AND VERIFIED WITH:                          YARBOROUGH,B/6E '@0521'  ON 10/06/13 BY KARCZEWSKI,S.  . HCT 10/06/2013 20.0* 36.0 - 46.0 % Final  . MCV 10/06/2013 92.2  78.0 - 100.0 fL Final  . MCH 10/06/2013 30.0  26.0 - 34.0 pg Final  . MCHC 10/06/2013 32.5  30.0 - 36.0 g/dL Final  . RDW 10/06/2013 15.8* 11.5 - 15.5 % Final  . Platelets 10/06/2013 249  150 -  400 K/uL Final   Comment: DELTA CHECK NOTED                          REPEATED TO VERIFY                          SPECIMEN CHECKED FOR CLOTS  . Order Confirmation 10/06/2013 ORDER PROCESSED BY BLOOD BANK   Final  .  WBC 10/07/2013 11.6* 4.0 - 10.5 K/uL Final  . RBC 10/07/2013 3.30* 3.87 - 5.11 MIL/uL Final  . Hemoglobin 10/07/2013 9.8* 12.0 - 15.0 g/dL Final   Comment: DELTA CHECK NOTED                          POST TRANSFUSION SPECIMEN  . HCT 10/07/2013 29.7* 36.0 - 46.0 % Final  . MCV 10/07/2013 90.0  78.0 - 100.0 fL Final  . MCH 10/07/2013 29.7  26.0 - 34.0 pg Final  . MCHC 10/07/2013 33.0  30.0 - 36.0 g/dL Final  . RDW 10/07/2013 15.3  11.5 - 15.5 % Final  . Platelets 10/07/2013 253  150 - 400 K/uL Final  . WBC 10/08/2013 10.3  4.0 - 10.5 K/uL Final  . RBC 10/08/2013 3.10* 3.87 - 5.11 MIL/uL Final  . Hemoglobin 10/08/2013 9.3* 12.0 - 15.0 g/dL Final  . HCT 10/08/2013 28.6* 36.0 - 46.0 % Final  . MCV 10/08/2013 92.3  78.0 - 100.0 fL Final  . MCH 10/08/2013 30.0  26.0 - 34.0 pg Final  . MCHC 10/08/2013 32.5  30.0 - 36.0 g/dL Final  . RDW 10/08/2013 15.0  11.5 - 15.5 % Final  . Platelets 10/08/2013 262  150 - 400 K/uL Final  . Glucose-Capillary 10/07/2013 110* 70 - 99 mg/dL Final  Admission on 09/06/2013, Discharged on 09/21/2013  No results displayed because visit has over 200 results.    Admission on 08/19/2013, Discharged on 08/27/2013  Component Date Value Ref Range Status  . aPTT 08/19/2013 39* 24 - 37 seconds Final   Comment:                                 IF BASELINE aPTT IS ELEVATED,                          SUGGEST PATIENT RISK ASSESSMENT                          BE USED TO DETERMINE APPROPRIATE                          ANTICOAGULANT THERAPY.  . WBC 08/19/2013 13.4* 4.0 - 10.5 K/uL Final  . RBC 08/19/2013 2.90* 3.87 - 5.11 MIL/uL Final  . Hemoglobin 08/19/2013 8.5* 12.0 - 15.0 g/dL Final  . HCT 08/19/2013 26.8* 36.0 - 46.0 % Final  . MCV 08/19/2013 92.4  78.0 - 100.0  fL Final  . MCH 08/19/2013 29.3  26.0 - 34.0 pg Final  . MCHC 08/19/2013 31.7  30.0 - 36.0 g/dL Final  . RDW 08/19/2013 13.3  11.5 - 15.5 % Final  . Platelets 08/19/2013 538* 150 - 400 K/uL Final  . Sodium 08/19/2013 136* 137 - 147 mEq/L Final  . Potassium 08/19/2013 4.5  3.7 - 5.3 mEq/L Final  . Chloride 08/19/2013 100  96 - 112 mEq/L Final  . CO2 08/19/2013 24  19 - 32 mEq/L Final  . Glucose, Bld 08/19/2013 112* 70 - 99 mg/dL Final  . BUN 08/19/2013 17  6 - 23 mg/dL Final  . Creatinine, Ser 08/19/2013 0.92  0.50 - 1.10 mg/dL Final  . Calcium 08/19/2013 9.3  8.4 - 10.5 mg/dL Final  . Total Protein 08/19/2013 7.0  6.0 - 8.3 g/dL Final  . Albumin  08/19/2013 3.0* 3.5 - 5.2 g/dL Final  . AST 08/19/2013 25  0 - 37 U/L Final  . ALT 08/19/2013 15  0 - 35 U/L Final  . Alkaline Phosphatase 08/19/2013 118* 39 - 117 U/L Final  . Total Bilirubin 08/19/2013 <0.2* 0.3 - 1.2 mg/dL Final  . GFR calc non Af Amer 08/19/2013 65* >90 mL/min Final  . GFR calc Af Amer 08/19/2013 76* >90 mL/min Final   Comment: (NOTE)                          The eGFR has been calculated using the CKD EPI equation.                          This calculation has not been validated in all clinical situations.                          eGFR's persistently <90 mL/min signify possible Chronic Kidney                          Disease.  . Anion gap 08/19/2013 12  5 - 15 Final  . Prothrombin Time 08/19/2013 14.3  11.6 - 15.2 seconds Final  . INR 08/19/2013 1.11  0.00 - 1.49 Final  . Specimen Description 08/19/2013 URINE, CATHETERIZED   Final  . Special Requests 08/19/2013 Normal   Final  . Culture  Setup Time 08/19/2013    Final                   Value:08/19/2013 20:24                         Performed at [facility]  . Colony Count 08/19/2013    Final                   Value:NO GROWTH                         Performed at [facility]  . Culture 08/19/2013    Final                   Value:NO GROWTH                          Performed at [facility]  . Report Status 08/19/2013 08/20/2013 FINAL   Final  . Sodium 08/20/2013 136* 137 - 147 mEq/L Final  . Potassium 08/20/2013 4.2  3.7 - 5.3 mEq/L Final  . Chloride 08/20/2013 100  96 - 112 mEq/L Final  . CO2 08/20/2013 26  19 - 32 mEq/L Final  . Glucose, Bld 08/20/2013 119* 70 - 99 mg/dL Final  . BUN 08/20/2013 15  6 - 23 mg/dL Final  . Creatinine, Ser 08/20/2013 0.87  0.50 - 1.10 mg/dL Final  . Calcium 08/20/2013 9.1  8.4 - 10.5 mg/dL Final  . GFR calc non Af Amer 08/20/2013 70* >90 mL/min Final  . GFR calc Af Amer 08/20/2013 81* >90 mL/min Final   Comment: (NOTE)                          The eGFR has been calculated using the CKD EPI equation.  This calculation has not been validated in all clinical situations.                          eGFR's persistently <90 mL/min signify possible Chronic Kidney                          Disease.  . Anion gap 08/20/2013 10  5 - 15 Final  . WBC 08/20/2013 9.9  4.0 - 10.5 K/uL Final  . RBC 08/20/2013 2.69* 3.87 - 5.11 MIL/uL Final  . Hemoglobin 08/20/2013 8.0* 12.0 - 15.0 g/dL Final  . HCT 08/20/2013 25.2* 36.0 - 46.0 % Final  . MCV 08/20/2013 93.7  78.0 - 100.0 fL Final  . MCH 08/20/2013 29.7  26.0 - 34.0 pg Final  . MCHC 08/20/2013 31.7  30.0 - 36.0 g/dL Final  . RDW 08/20/2013 13.5  11.5 - 15.5 % Final  . Platelets 08/20/2013 501* 150 - 400 K/uL Final  . ABO/RH(D) 08/20/2013 O POS   Final  . Antibody Screen 08/20/2013 NEG   Final  . Sample Expiration 08/20/2013 08/23/2013   Final  . Unit Number 08/20/2013 [id number]   Final  . Blood Component Type 08/20/2013 RED CELLS,LR   Final  . Unit division 08/20/2013 00   Final  . Status of Unit 08/20/2013 ISSUED,FINAL   Final  . Transfusion Status 08/20/2013 OK TO TRANSFUSE   Final  . Crossmatch Result 08/20/2013 Compatible   Final  . Unit Number 08/20/2013 [id number]   Final  . Blood Component Type 08/20/2013 RED CELLS,LR    Final  . Unit division 08/20/2013 00   Final  . Status of Unit 08/20/2013 ISSUED,FINAL   Final  . Transfusion Status 08/20/2013 OK TO TRANSFUSE   Final  . Crossmatch Result 08/20/2013 Compatible   Final  . Unit Number 08/20/2013 [id number]   Final  . Blood Component Type 08/20/2013 RBC LR PHER1   Final  . Unit division 08/20/2013 00   Final  . Status of Unit 08/20/2013 ISSUED,FINAL   Final  . Transfusion Status 08/20/2013 OK TO TRANSFUSE   Final  . Crossmatch Result 08/20/2013 Compatible   Final  . Unit Number 08/20/2013 [id number]   Final  . Blood Component Type 08/20/2013 RED CELLS,LR   Final  . Unit division 08/20/2013 00   Final  . Status of Unit 08/20/2013 ISSUED,FINAL   Final  . Transfusion Status 08/20/2013 OK TO TRANSFUSE   Final  . Crossmatch Result 08/20/2013 Compatible   Final  . Unit Number 08/20/2013 [id number]   Final  . Blood Component Type 08/20/2013 RBC LR PHER2   Final  . Unit division 08/20/2013 00   Final  . Status of Unit 08/20/2013 ISSUED,FINAL   Final  . Transfusion Status 08/20/2013 OK TO TRANSFUSE   Final  . Crossmatch Result 08/20/2013 Compatible   Final  . Unit Number 08/20/2013 [id number]   Final  . Blood Component Type 08/20/2013 RBC LR PHER2   Final  . Unit division 08/20/2013 00   Final  . Status of Unit 08/20/2013 ISSUED,FINAL   Final  . Transfusion Status 08/20/2013 OK TO TRANSFUSE   Final  . Crossmatch Result 08/20/2013 Compatible   Final  . Order Confirmation 08/20/2013 ORDER PROCESSED BY BLOOD BANK   Final  . Sodium 08/21/2013 138  137 - 147 mEq/L Final  . Potassium 08/21/2013 4.4  3.7 - 5.3 mEq/L Final  . Chloride 08/21/2013 101  96 - 112 mEq/L Final  . CO2 08/21/2013 27  19 - 32 mEq/L Final  . Glucose, Bld 08/21/2013 123* 70 - 99 mg/dL Final  . BUN 08/21/2013 12  6 - 23 mg/dL Final  . Creatinine, Ser 08/21/2013 0.95  0.50 - 1.10 mg/dL Final  . Calcium 08/21/2013 9.0  8.4 - 10.5 mg/dL Final  . GFR calc non Af Amer 08/21/2013 63*  >90 mL/min Final  . GFR calc Af Amer 08/21/2013 73* >90 mL/min Final   Comment: (NOTE)                          The eGFR has been calculated using the CKD EPI equation.                          This calculation has not been validated in all clinical situations.                          eGFR's persistently <90 mL/min signify possible Chronic Kidney                          Disease.  . Anion gap 08/21/2013 10  5 - 15 Final  . WBC 08/21/2013 9.8  4.0 - 10.5 K/uL Final  . RBC 08/21/2013 3.59* 3.87 - 5.11 MIL/uL Final  . Hemoglobin 08/21/2013 10.5* 12.0 - 15.0 g/dL Final   Comment: DELTA CHECK NOTED                          POST TRANSFUSION SPECIMEN  . HCT 08/21/2013 32.7* 36.0 - 46.0 % Final  . MCV 08/21/2013 91.1  78.0 - 100.0 fL Final  . MCH 08/21/2013 29.2  26.0 - 34.0 pg Final  . MCHC 08/21/2013 32.1  30.0 - 36.0 g/dL Final  . RDW 08/21/2013 14.9  11.5 - 15.5 % Final  . Platelets 08/21/2013 464* 150 - 400 K/uL Final  . MRSA, PCR 08/21/2013 NEGATIVE  NEGATIVE Final  . Staphylococcus aureus 08/21/2013 POSITIVE* NEGATIVE Final   Comment:                                 The Xpert SA Assay (FDA                          approved for NASAL specimens                          in patients over 54 years of age),                          is one component of                          a comprehensive surveillance                          program.  Test performance has                          been validated by [facility] for patients greater                          than or equal to 42 year old.                          It is not intended                          to diagnose infection nor to                          guide or monitor treatment.  . WBC 08/22/2013 14.6* 4.0 - 10.5 K/uL Final  . RBC 08/22/2013 3.01* 3.87 - 5.11 MIL/uL Final  . Hemoglobin 08/22/2013 9.0* 12.0 - 15.0 g/dL Final  . HCT 08/22/2013 26.9* 36.0 - 46.0 % Final  . MCV 08/22/2013 89.4  78.0 - 100.0  fL Final  . MCH 08/22/2013 29.9  26.0 - 34.0 pg Final  . MCHC 08/22/2013 33.5  30.0 - 36.0 g/dL Final  . RDW 08/22/2013 14.3  11.5 - 15.5 % Final  . Platelets 08/22/2013 377  150 - 400 K/uL Final  . Sodium 08/22/2013 139  137 - 147 mEq/L Final  . Potassium 08/22/2013 4.2  3.7 - 5.3 mEq/L Final  . Chloride 08/22/2013 104  96 - 112 mEq/L Final  . CO2 08/22/2013 26  19 - 32 mEq/L Final  . Glucose, Bld 08/22/2013 140* 70 - 99 mg/dL Final  . BUN 08/22/2013 9  6 - 23 mg/dL Final  . Creatinine, Ser 08/22/2013 0.74  0.50 - 1.10 mg/dL Final  . Calcium 08/22/2013 8.5  8.4 - 10.5 mg/dL Final  . GFR calc non Af Amer 08/22/2013 89* >90 mL/min Final  . GFR calc Af Amer 08/22/2013 >90  >90 mL/min Final   Comment: (NOTE)                          The eGFR has been calculated using the CKD EPI equation.                          This calculation has not been validated in all clinical situations.                          eGFR's persistently <90 mL/min signify possible Chronic Kidney                          Disease.  . Anion gap 08/22/2013 9  5 - 15 Final  . WBC 08/23/2013 16.3* 4.0 - 10.5 K/uL Final  . RBC 08/23/2013 2.65* 3.87 - 5.11 MIL/uL Final  . Hemoglobin 08/23/2013 8.0* 12.0 - 15.0 g/dL Final  . HCT 08/23/2013 24.1* 36.0 - 46.0 % Final  . MCV 08/23/2013 90.9  78.0 - 100.0 fL Final  . MCH 08/23/2013 30.2  26.0 - 34.0 pg Final  . MCHC 08/23/2013 33.2  30.0 - 36.0 g/dL Final  . RDW 08/23/2013 14.4  11.5 - 15.5 % Final  . Platelets 08/23/2013 331  150 - 400 K/uL Final  . Sodium 08/23/2013 136* 137 - 147 mEq/L Final  . Potassium 08/23/2013 4.2  3.7 - 5.3 mEq/L Final  . Chloride 08/23/2013 101  96 -  112 mEq/L Final  . CO2 08/23/2013 25  19 - 32 mEq/L Final  . Glucose, Bld 08/23/2013 109* 70 - 99 mg/dL Final  . BUN 08/23/2013 10  6 - 23 mg/dL Final  . Creatinine, Ser 08/23/2013 0.83  0.50 - 1.10 mg/dL Final  . Calcium 08/23/2013 8.4  8.4 - 10.5 mg/dL Final  . GFR calc non Af Amer 08/23/2013 74*  >90 mL/min Final  . GFR calc Af Amer 08/23/2013 86* >90 mL/min Final   Comment: (NOTE)                          The eGFR has been calculated using the CKD EPI equation.                          This calculation has not been validated in all clinical situations.                          eGFR's persistently <90 mL/min signify possible Chronic Kidney                          Disease.  . Anion gap 08/23/2013 10  5 - 15 Final  . Order Confirmation 08/23/2013 ORDER PROCESSED BY BLOOD BANK   Final  . WBC 08/24/2013 14.3* 4.0 - 10.5 K/uL Final  . RBC 08/24/2013 3.40* 3.87 - 5.11 MIL/uL Final  . Hemoglobin 08/24/2013 10.0* 12.0 - 15.0 g/dL Final   Comment: DELTA CHECK NOTED                          POST TRANSFUSION SPECIMEN  . HCT 08/24/2013 30.2* 36.0 - 46.0 % Final  . MCV 08/24/2013 88.8  78.0 - 100.0 fL Final  . MCH 08/24/2013 29.4  26.0 - 34.0 pg Final  . MCHC 08/24/2013 33.1  30.0 - 36.0 g/dL Final  . RDW 08/24/2013 14.8  11.5 - 15.5 % Final  . Platelets 08/24/2013 343  150 - 400 K/uL Final  . Sodium 08/24/2013 139  137 - 147 mEq/L Final  . Potassium 08/24/2013 4.2  3.7 - 5.3 mEq/L Final  . Chloride 08/24/2013 101  96 - 112 mEq/L Final  . CO2 08/24/2013 28  19 - 32 mEq/L Final  . Glucose, Bld 08/24/2013 107* 70 - 99 mg/dL Final  . BUN 08/24/2013 9  6 - 23 mg/dL Final  . Creatinine, Ser 08/24/2013 0.83  0.50 - 1.10 mg/dL Final  . Calcium 08/24/2013 8.6  8.4 - 10.5 mg/dL Final  . GFR calc non Af Amer 08/24/2013 74* >90 mL/min Final  . GFR calc Af Amer 08/24/2013 86* >90 mL/min Final   Comment: (NOTE)                          The eGFR has been calculated using the CKD EPI equation.                          This calculation has not been validated in all clinical situations.                          eGFR's persistently <90 mL/min signify possible Chronic Kidney  Disease.  . Anion gap 08/24/2013 10  5 - 15 Final     X-Rays:Dg Hip Operative Left  10/01/2013    CLINICAL DATA:  Attempted closed reduction, left hip prosthetic dislocation  EXAM: OPERATIVE LEFT HIP  COMPARISON:  09/06/2013  FINDINGS: Prosthetic left hip arthroplasty dislocation posteriorly is reidentified. Amorphous soft tissue ossification reidentified. No fracture line is seen. The sideplate is abnormal distracted with respect to the left proximal femoral cortical surface.  IMPRESSION: Recurrent left posterior prosthetic hip dislocation. Abnormal distraction of the prostatic sideplate with respect to the proximal femur. This suggests loosening.   Electronically Signed   By: Conchita Paris M.D.   On: 10/01/2013 15:58   Dg Femur Left  10/04/2013   CLINICAL DATA:  Total hip replacement  EXAM: LEFT FEMUR - 2 VIEW  COMPARISON:  None.  FINDINGS: Frontal and lateral views of the mid to distal femur were obtained. There is a fixation device with the tip at the junction of the mid and distal thirds of the femur. There is a fracture anterior to the distal aspect of the fixation device. Fracture fragment also extends more distally within the intra medullary portion of the bone. Alignment is near anatomic in these fracture areas. No knee dislocation seen. No images more proximally submitted.  IMPRESSION: Fractures along the anterior aspect of the femur at the level of the distal most aspect of the hip fixation device. Intra medullary linear fracture seen distal to the fixation device. Alignment near anatomic in these areas.   Electronically Signed   By: Lowella Grip M.D.   On: 10/04/2013 19:02   Dg Pelvis Portable  09/08/2013   CLINICAL DATA:  Status post left hip surgery.  EXAM: PORTABLE PELVIS 1-2 VIEWS  COMPARISON:  08/21/2013.  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 A new left total hip prosthesis is demonstrated with interval discontinuity of the upper cerclage wire. No acute fracture or dislocation seen.   IMPRESSION: Interval replacement of the left hip prosthesis with discontinuity of the upper cerclage wire.   Electronically Signed   By: Enrique Sack M.D.   On: 09/08/2013 20:31   Dg Chest Port 1 View  09/12/2013   CLINICAL DATA:  63 year old female with fever, cough and congestion.  EXAM: PORTABLE CHEST - 1 VIEW  COMPARISON:  09/10/2013 and prior radiographs dating back to 07/12/2013.  FINDINGS: The cardiomediastinal silhouette is unremarkable.  There is no evidence of focal airspace disease, pulmonary edema, suspicious pulmonary nodule/mass, pleural effusion, or pneumothorax. No acute bony abnormalities are identified.  IMPRESSION: No active disease.   Electronically Signed   By: Hassan Rowan M.D.   On: 09/12/2013 13:46   Dg Chest Port 1 View  09/10/2013   CLINICAL DATA:  Elevated white blood count, cough, shortness of breath  EXAM: PORTABLE CHEST - 1 VIEW  COMPARISON:  07/12/2013  FINDINGS: The heart size and mediastinal contours are within normal limits. Both lungs are clear. The visualized skeletal structures are unremarkable.  IMPRESSION: No active disease.   Electronically Signed   By: Skipper Cliche M.D.   On: 09/10/2013 07:41    EKG: Orders placed during the hospital encounter of 10/01/13  . EKG 12-LEAD  . EKG 12-LEAD  . EKG 12-LEAD  . EKG 12-LEAD     Hospital Course: Patient was admitted to [facility] on 9/18 for a left hip dislocation.  She was taken to the  OR and underwent the above state procedure #1 without attempted CLOSED REDUCTION HIP and dressing change by Dr. Netta Cedars who happened to be covering for Dr. Wynelle Link at the time of her admission.  Patient tolerated the procedure well  But unfortunately the hip was unable to go back in. She was later transferred to the recovery room and then to the orthopaedic floor for postoperative care and placed at bedrest.  They were given PO and IV analgesics for pain control following their surgery. She remained in house over the weekend  at bedrest since Dr. Wynelle Link was out of town.  She was pre oped on Sunday for planned surgery on Monday 10/04/2013.  She was allowed liquid breakfast that morning and then NPO for surgery later that afternoon. On Monday evening, she was taken to the OR and underwent the above state procedure #2, Left total hip arthroplasty revision. They continued on the scheduled antibiotics of  Anti-infectives   Start     Dose/Rate Route Frequency Ordered Stop   10/04/13 0700  vancomycin (VANCOCIN) IVPB 1000 mg/200 mL premix     1,000 mg 200 mL/hr over 60 Minutes Intravenous  Once 10/04/13 0654 10/04/13 1734   10/01/13 2000  ertapenem (INVANZ) 1 g in sodium chloride 0.9 % 50 mL IVPB     1 g 100 mL/hr over 30 Minutes Intravenous Every 24 hours 10/01/13 1845       and started on DVT prophylaxis in the form of Aspirin.   PT and OT were ordered for total hip protocol.  The patient was placed on Toe Touch Weight Bearing ONLY to left leg.   Discharge planning was consulted to help with postop disposition and equipment needs.  Patient had a tough night on the evening of surgery.  Not much sleep that night. Pressures were soft that night. She received two units of blood during the surgery the day before. HGB was 9.6 on POD 1.  Monitored blood count and monitored for symptoms. Denied any CP/SOB/Nausea/Dizziness at that time.  They were kept at bedrest on day one.  Hemovac drains were sewn in place.  The knee immobilizer was removed and discontinued.   Patient seen in rounds with Dr. Wynelle Link on POD 2.  Patient was better, but has had some minor complaints of pain in the hip and thigh, requiring pain medications.  Plan was to go Skilled nursing facility after hospital stay and looking at [facility] again.  Toe Touch Weight Bearing ONLY to left leg. Bed to char transfers only.  Dressing was changed on day two and the incision was healing well.  Hemovacs left In Place **DRAINS ARE SEWN IN**. POD 3 - Patient seen in rounds by Dr.  Wynelle Link. She continued to improve slowly.  Patient was better. DVT Prophylaxis - Aspirin 325 mg  Toe Touch Weight Bearing ONLY to left leg. Bed to char transfers only. By day three, the patient had progressed with therapy and meeting their goals.  Incision was healing well.  Patient was seen in rounds and was ready to go home. POD 4 - She was seen again by Dr. Wynelle Link on POD 4 and still had some minor complaints of soreness in the leg.  She was only allowed to be Bed to Chair Transfers at that time.  Patient was ready to go to the SNF - [facility].  Her blood count had remained stable over the last couple of days after receiving blood postop. She was allowed to transfer over at  that time with close follow up to be scheduled on the next Tuesday October 12, 2013.  Diet: Regular diet Activity:   Bed to char transfers only for now. No bending hip over 90 degrees- A "L" Angle Do not cross legs Do not let foot roll inward When turning these patients a pillow should be placed between the patient's legs to prevent crossing. Patients should have the affected knee fully extended when trying to sit or stand from all surfaces to prevent excessive hip flexion. When ambulating and turning toward the affected side the affected leg should have the toes turned out prior to moving the walker and the rest of patient's body as to prevent internal rotation/ turning in of the leg. Abduction pillows are the most effective way to prevent a patient from not crossing legs or turning toes in at rest. If an abduction pillow is not ordered placing a regular pillow length wise between the patient's legs is also an effective reminder. It is imperative that these precautions be maintained so that the surgical hip does not dislocate. Follow-up:  in 4 days Disposition - Skilled nursing facility - [facility] Discharged Condition: stable       Discharge Instructions   Bed to Chair Transfer    Complete by:  As directed       Call MD / Call 911    Complete by:  As directed   If you experience chest pain or shortness of breath, CALL 911 and be transported to the hospital emergency room.  If you develope a fever above 101 F, pus (white drainage) or increased drainage or redness at the wound, or calf pain, call your surgeon's office.     Change dressing    Complete by:  As directed   You may change your dressing dressing daily with sterile 4 x 4 inch gauze dressing and paper tape.  Do not submerge the incision under water.     Constipation Prevention    Complete by:  As directed   Drink plenty of fluids.  Prune juice may be helpful.  You may use a stool softener, such as Colace (over the counter) 100 mg twice a day.  Use MiraLax (over the counter) for constipation as needed.     Diet general    Complete by:  As directed      Discharge instructions    Complete by:  As directed   Pick up stool softner and laxative for home. Do not submerge incision under water. May shower. Continue to use ice for pain and swelling from surgery. Hip precautions.  Total Hip Protocol. Toe Touch Weight Bearing ONLY to left leg. Take a 325 mg Aspirin daily for three weeks and then switch over to 81 mg Aspirin daily  When discharged from the skilled rehab facility, please have the facility set up the patient's [place] prior to being released.  Also provide the patient with their medications at time of release from the facility to include their pain medication, the muscle relaxants, and their blood thinner medication.  If the patient is still at the rehab facility at time of follow up appointment, please also assist the patient in arranging follow up appointment in our office and any transportation needs.     Do not sit on low chairs, stoools or toilet seats, as it may be difficult to get up from low surfaces    Complete by:  As directed      Driving restrictions  Complete by:  As directed   No driving until released  by the physician.     Follow the hip precautions as taught in Physical Therapy    Complete by:  As directed      Lifting restrictions    Complete by:  As directed   No lifting until released by the physician.     Patient may shower    Complete by:  As directed   You may shower without a dressing once there is no drainage.  Do not wash over the wound.  If drainage remains, do not shower until drainage stops.     TED hose    Complete by:  As directed   Use stockings (TED hose) for 3 weeks on both leg(s).  You may remove them at night for sleeping.     Touch down weight bearing    Complete by:  As directed   Laterality:  left  Extremity:  Lower  Do Not Advance Weight Bearing  Bed to Chair Transfer ONLY            Medication List    STOP taking these medications       CALTRATE 600 PLUS-VIT D PO     multivitamins with iron Tabs tablet      TAKE these medications       acetaminophen 325 MG tablet  Commonly known as:  TYLENOL  Take 2 tablets (650 mg total) by mouth every 6 (six) hours as needed for mild pain (or Fever >/= 101).     acetaminophen 650 MG suppository  Commonly known as:  TYLENOL  Place 1 suppository (650 mg total) rectally every 6 (six) hours as needed for mild pain (or Fever >/= 101).     albuterol 108 (90 BASE) MCG/ACT inhaler  Commonly known as:  PROVENTIL HFA;VENTOLIN HFA  Inhale 2 puffs into the lungs every 6 (six) hours as needed for wheezing or shortness of breath.     aspirin 325 MG EC tablet  Take 1 tablet (325 mg total) by mouth daily. Take a full dose 325 mg Aspirin daily for three more weeks, and then switch over to a baby 81 mg Aspirin for three more weeks.     buPROPion 150 MG 12 hr tablet  Commonly known as:  WELLBUTRIN SR  Take 150 mg by mouth 2 (two) times daily.     busPIRone 10 MG tablet  Commonly known as:  BUSPAR  Take 10 mg by mouth 2 (two) times daily.     citalopram 10 MG tablet  Commonly known as:  CELEXA  Take 10 mg by mouth  every morning.     cyclobenzaprine 10 MG tablet  Commonly known as:  FLEXERIL  Take 1 tablet (10 mg total) by mouth 2 (two) times daily.     diphenhydrAMINE 25 MG tablet  Commonly known as:  BENADRYL  Take 1 tablet (25 mg total) by mouth every 6 (six) hours as needed for itching.     doxepin 10 MG capsule  Commonly known as:  SINEQUAN  Take 10 mg by mouth at bedtime.     ertapenem 1 g in sodium chloride 0.9 % 50 mL  Inject 1 g into the vein daily. With the last dose to be given on 10/29/13     esomeprazole 20 MG capsule  Commonly known as:  NEXIUM  Take 1 capsule (20 mg total) by mouth 2 (two) times daily before a meal.     fexofenadine  180 MG tablet  Commonly known as:  ALLEGRA  Take 180 mg by mouth daily.     fluocinolone 0.01 % external solution  Commonly known as:  SYNALAR  Place 2 drops into both ears 2 (two) times daily as needed (itching).     HYDROmorphone 2 MG tablet  Commonly known as:  DILAUDID  Take 1-2 tablets (2-4 mg total) by mouth every 3 (three) hours as needed for severe pain.     iron polysaccharides 150 MG capsule  Commonly known as:  NIFEREX  Take 1 capsule (150 mg total) by mouth 2 (two) times daily.     LORazepam 0.5 MG tablet  Commonly known as:  ATIVAN  Take 1 tablet (0.5 mg total) by mouth 2 (two) times daily as needed for anxiety.     methocarbamol 500 MG tablet  Commonly known as:  ROBAXIN  Take 1 tablet (500 mg total) by mouth every 6 (six) hours as needed for muscle spasms.     metoCLOPramide 5 MG tablet  Commonly known as:  REGLAN  Take 1-2 tablets (5-10 mg total) by mouth every 8 (eight) hours as needed for nausea (if ondansetron (ZOFRAN) ineffective.).     ondansetron 4 MG tablet  Commonly known as:  ZOFRAN  Take 1 tablet (4 mg total) by mouth every 6 (six) hours as needed for nausea.     oxybutynin 5 MG 24 hr tablet  Commonly known as:  DITROPAN-XL  Take 5 mg by mouth daily.     polyethylene glycol packet  Commonly known as:   MIRALAX / GLYCOLAX  Take 17 g by mouth 2 (two) times daily.     senna 8.6 MG Tabs tablet  Commonly known as:  SENOKOT  Take 1 tablet by mouth 2 (two) times daily.       Follow-up Information   Follow up with [name], MD. Schedule an appointment as soon as possible for a visit on 10/12/2013. (Call office at [phone] to set up appointment on next Tuesday with Dr. Wynelle Link.)    Specialty:  Orthopedic Surgery   Contact information:   [address] [place] [address] [place] [address] [phone]       Signed: Arlee Muslim, PA-C Orthopaedic Surgery 10/08/2013, 1:33 PM

## 2013-10-19 ENCOUNTER — Encounter: Payer: Self-pay | Admitting: Infectious Diseases

## 2013-10-19 ENCOUNTER — Ambulatory Visit (INDEPENDENT_AMBULATORY_CARE_PROVIDER_SITE_OTHER): Payer: BC Managed Care – PPO | Admitting: Infectious Diseases

## 2013-10-19 VITALS — BP 120/69 | HR 86 | Temp 98.3°F

## 2013-10-19 DIAGNOSIS — T84041A Periprosthetic fracture around internal prosthetic left hip joint, initial encounter: Secondary | ICD-10-CM

## 2013-10-19 DIAGNOSIS — Z96642 Presence of left artificial hip joint: Secondary | ICD-10-CM | POA: Diagnosis not present

## 2013-10-19 DIAGNOSIS — M9702XA Periprosthetic fracture around internal prosthetic left hip joint, initial encounter: Secondary | ICD-10-CM

## 2013-10-19 LAB — CBC
HCT: 31 % — ABNORMAL LOW (ref 36.0–46.0)
Hemoglobin: 10.1 g/dL — ABNORMAL LOW (ref 12.0–15.0)
MCH: 29.2 pg (ref 26.0–34.0)
MCHC: 32.6 g/dL (ref 30.0–36.0)
MCV: 89.6 fL (ref 78.0–100.0)
PLATELETS: 500 10*3/uL — AB (ref 150–400)
RBC: 3.46 MIL/uL — ABNORMAL LOW (ref 3.87–5.11)
RDW: 14.4 % (ref 11.5–15.5)
WBC: 9.8 10*3/uL (ref 4.0–10.5)

## 2013-10-19 MED ORDER — AMOXICILLIN-POT CLAVULANATE 500-125 MG PO TABS: 1.0000 | ORAL_TABLET | Freq: Three times a day (TID) | ORAL | Status: AC

## 2013-10-19 NOTE — Assessment & Plan Note (Addendum)
She is doing well. Will check her inflammatory markers. Will plan to pull her PIC, stop her anbx on 10-29-13. She will start augmentin (her PEN allergy is diarrhea) after that. Will see her back in 2 months.

## 2013-10-19 NOTE — Progress Notes (Signed)
Order obtained from Dr. Johnnye Sima for lab draw from PICC for CBC w/ Diff, CMET, Sed Rate and CRP.[name]  Patient identified with name and DOB. Donned gloves. Cleaned tubing/hub connection cap with CHG wipe for 30 seconds, using scrubbing motion. Attached empty, sterile 10 cc syringe, opened clamp, withdrew 10 cc of waste and set aside. Attached 20 cc syringe and withdrew 16 cc of blood, transferred to lab tubes.  Sterile dressing change to right upper arm PICC site per [facility] Protocol.  Pt tolerated procedure without complaints.

## 2013-10-19 NOTE — Progress Notes (Signed)
   Subjective:    Patient ID: [name], female    DOB: [birth year], 63 y.o.   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 She underwent repair of this 8-26 after dislocating her prosthesis.  48h later she was noted to have low grade temp (100) and to have WBC 18.7. By 8-30 she had temp to 102.2. She was started on vanco/aztreonam. Her temp improved and her WBC decreased. She returned to the OR on 9-2 and underwent washout of her hip. She was felt to have a hematoma. Her Cx was poly-microbial (No staph, No strep).  She was changed to invanz/vanco on 9-5, then changed to invanz alone.  She underwent attempted reduction of her hip (9-18) and then revision of her hip on 9-21. Her ESR was 74 and her CRP was 4. 5 (09-19-13).  She was d/c home on 9-27 on invanz. States she has missed 3 doses. Finish date 10-16.  Had staples out this AM and last drain removed. No wound drainage except from drain in place. Has exercised in wheel chair, had drainage then which wet through her clothes and bandage. Has done minimal wt bearing, no pain in hip.  No problems with PIC line. No erythema, no d/c. Has had occas mild pruritis.   No f/c. Normal BM (with laxatives), normal urine.  Has had thrush in her mouth and is oral rx.   Review of Systems  Constitutional: Negative for fever, chills and unexpected weight change.  Gastrointestinal: Positive for constipation. Negative for diarrhea.  Genitourinary: Negative for difficulty urinating.       Objective:   Physical Exam  Constitutional: She appears well-developed and well-nourished.  HENT:  Mouth/Throat: No oropharyngeal exudate.  Eyes: EOM are normal. Pupils are equal, round, and reactive to light.  Neck: Neck supple.  Cardiovascular: Normal rate, regular rhythm and normal heart sounds.   Pulmonary/Chest: Effort normal and breath sounds normal.  Abdominal: Soft. Bowel sounds are normal. There is no  tenderness. There is no rebound.  Musculoskeletal:       Arms:      Legs: Lymphadenopathy:    She has no cervical adenopathy.          Assessment & Plan:

## 2013-10-20 LAB — COMPREHENSIVE METABOLIC PANEL
ALK PHOS: 265 U/L — AB (ref 39–117)
ALT: 10 U/L (ref 0–35)
AST: 18 U/L (ref 0–37)
Albumin: 3.2 g/dL — ABNORMAL LOW (ref 3.5–5.2)
BUN: 18 mg/dL (ref 6–23)
CO2: 25 mEq/L (ref 19–32)
CREATININE: 0.68 mg/dL (ref 0.50–1.10)
Calcium: 8.6 mg/dL (ref 8.4–10.5)
Chloride: 107 mEq/L (ref 96–112)
Glucose, Bld: 141 mg/dL — ABNORMAL HIGH (ref 70–99)
Potassium: 3.7 mEq/L (ref 3.5–5.3)
SODIUM: 141 meq/L (ref 135–145)
TOTAL PROTEIN: 6.1 g/dL (ref 6.0–8.3)
Total Bilirubin: 0.2 mg/dL (ref 0.2–1.2)

## 2013-10-20 LAB — SEDIMENTATION RATE: Sed Rate: 32 mm/hr — ABNORMAL HIGH (ref 0–22)

## 2013-10-20 LAB — C-REACTIVE PROTEIN: CRP: 1.3 mg/dL — ABNORMAL HIGH (ref <0.60)

## 2013-10-26 ENCOUNTER — Telehealth: Payer: Self-pay | Admitting: Licensed Clinical Social Worker

## 2013-10-26 NOTE — Telephone Encounter (Signed)
Patient is being discharged today from [facility] today and she was to discontinue IV antibiotics on 10/29/13. Per RN patient was unable to maintain PICC line and would like an order to remove it and start on po antibiotics. Per Dr. Johnnye Sima ok to remove PICC and stop IV antibiotics, patient will start on oral Augmentin on 10/27/13. Patient will f/u in our office on 11/2

## 2013-11-30 ENCOUNTER — Other Ambulatory Visit: Payer: Self-pay | Admitting: Infectious Diseases

## 2013-11-30 ENCOUNTER — Ambulatory Visit (INDEPENDENT_AMBULATORY_CARE_PROVIDER_SITE_OTHER): Payer: BC Managed Care – PPO | Admitting: *Deleted

## 2013-11-30 ENCOUNTER — Encounter: Payer: Self-pay | Admitting: Infectious Diseases

## 2013-11-30 ENCOUNTER — Ambulatory Visit (INDEPENDENT_AMBULATORY_CARE_PROVIDER_SITE_OTHER): Payer: BC Managed Care – PPO | Admitting: Infectious Diseases

## 2013-11-30 VITALS — BP 145/77 | HR 87 | Temp 98.1°F

## 2013-11-30 DIAGNOSIS — Z23 Encounter for immunization: Secondary | ICD-10-CM | POA: Diagnosis not present

## 2013-11-30 DIAGNOSIS — B370 Candidal stomatitis: Secondary | ICD-10-CM

## 2013-11-30 DIAGNOSIS — T814XXD Infection following a procedure, subsequent encounter: Secondary | ICD-10-CM

## 2013-11-30 DIAGNOSIS — B999 Unspecified infectious disease: Secondary | ICD-10-CM

## 2013-11-30 DIAGNOSIS — IMO0001 Reserved for inherently not codable concepts without codable children: Secondary | ICD-10-CM

## 2013-11-30 MED ORDER — CLOTRIMAZOLE 10 MG MT TROC: 10.0000 mg | Freq: Three times a day (TID) | OROMUCOSAL | Status: DC

## 2013-11-30 MED ORDER — CLOTRIMAZOLE 10 MG MT TROC: 10.0000 mg | Freq: Three times a day (TID) | OROMUCOSAL | Status: AC

## 2013-11-30 NOTE — Assessment & Plan Note (Signed)
Her oral sore-ness and dysgusia suggest thrush (though no patches are seen on exam). will give her a trial of mycelex.

## 2013-11-30 NOTE — Progress Notes (Signed)
   Subjective:    Patient ID: [name], female    DOB: [birth year], 63 y.o.   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 She underwent repair of this 8-26 after dislocating her prosthesis.  48h later she was noted to have low grade temp (100) and to have WBC 18.7. By 8-30 she had temp to 102.2. She was started on vanco/aztreonam. Her temp improved and her WBC decreased. She returned to the OR on 9-2 and underwent washout of her hip. She was felt to have a hematoma. Her Cx was poly-microbial (No staph, No strep).  She was changed to invanz/vanco on 9-5, then changed to invanz alone.  She underwent attempted reduction of her hip (9-18) and then revision of her hip (9-21). Her ESR was 74 and her CRP was 4. 5 (09-19-13).  She was d/c home on 9-27 on invanz and completed this on 10-16. She was then changed to augmentin.  Her ESR 32 and CRP 1.3 on (10-19-13) She had therapy today, was able to stand up. Has also been learning how to negotiate ramp in her wheel chair.  Augmentin has been going well- takes it with food. No problems with loose BM or rash. Has gained a little weight since she has been home from SNF. No yeast infections but has had sore mouth (esp with acidic/ketchup). Mouth has been raspberry red. Food tastes different.   Review of Systems  Constitutional: Negative for fever, chills and unexpected weight change.  HENT: Negative for mouth sores.   Gastrointestinal: Negative for diarrhea and constipation.  Genitourinary: Negative for difficulty urinating.       Objective:   Physical Exam  Constitutional: She appears well-developed and well-nourished.  HENT:  Mouth/Throat: No oropharyngeal exudate.  Eyes: EOM are normal. Pupils are equal, round, and reactive to light.  Musculoskeletal:       Legs:         Assessment & Plan:

## 2013-11-30 NOTE — Assessment & Plan Note (Signed)
She appears to be doing well. Her ESR and CRP have come down (although not completely normal). She will continue on augmentin until September or 2016 ( 1 year from her wash out). She asks for ID MD in [place] due to travel difficulties. Will try to have appt with Dr Priest at [facility].  rtc prn.  

## 2013-12-01 ENCOUNTER — Telehealth: Payer: Self-pay | Admitting: *Deleted

## 2013-12-01 NOTE — Telephone Encounter (Signed)
Referral for transfer faxed to [facility], Dr. Idelle Crouch at [phone] and confirmation received. Office notes, demographics, insurance info, and lab results sent. Phone [phone].

## 2013-12-03 ENCOUNTER — Telehealth: Payer: Self-pay | Admitting: *Deleted

## 2013-12-03 NOTE — Telephone Encounter (Signed)
Notified patient of appointment with [facility] for 12/28/13 at 8:45 AM. She will be seeing Dr. Idelle Crouch and they will be sending her a new patient packet. [name]

## 2013-12-29 ENCOUNTER — Telehealth: Payer: Self-pay | Admitting: *Deleted

## 2013-12-29 NOTE — Telephone Encounter (Signed)
Patient was seen by Dr. Idelle Crouch at [facility].  Notes were faxed from that visit to Dr. Algis Downs attention. Placed in his box. Landis Gandy, RN

## 2014-11-14 IMAGING — CR DG HIP (WITH OR WITHOUT PELVIS) 2-3V*L*
3 series · 3 of 3 positions shown · non-contrast
Comparison: None.

CLINICAL DATA: Left leg pain

EXAM:
LEFT HIP - COMPLETE 2+ VIEW

[t pelvis ap]
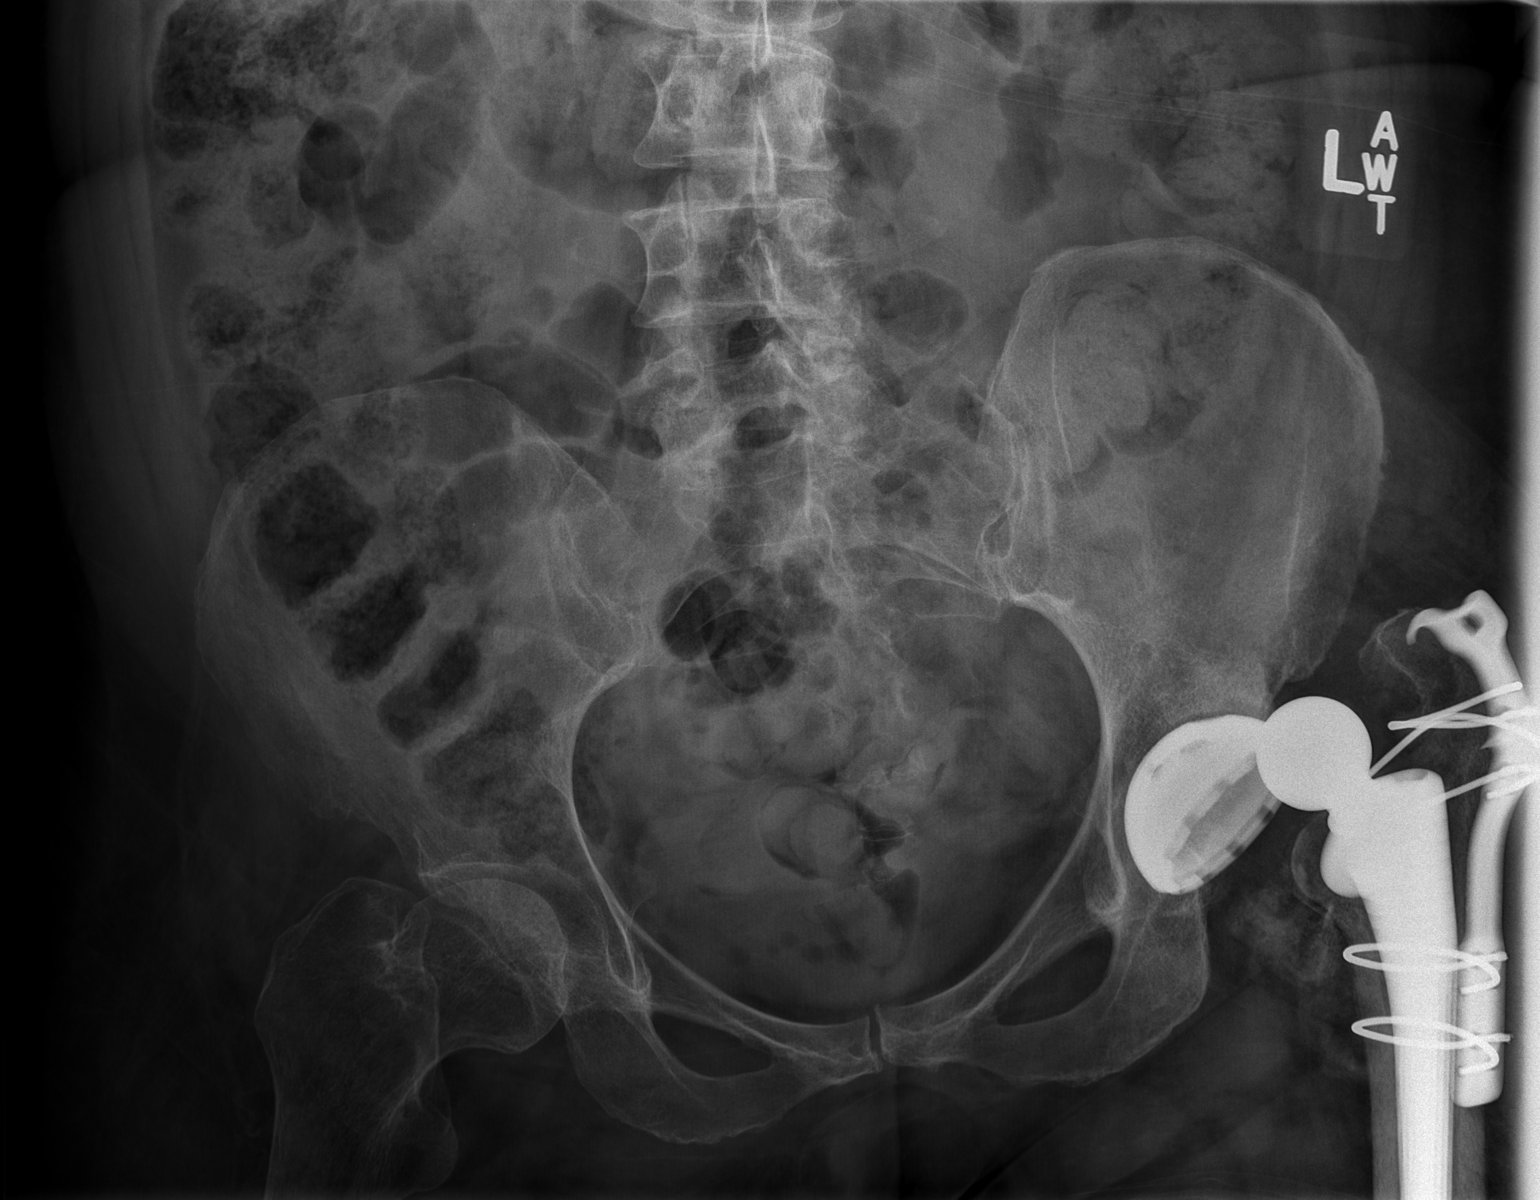

[t hip ap left]
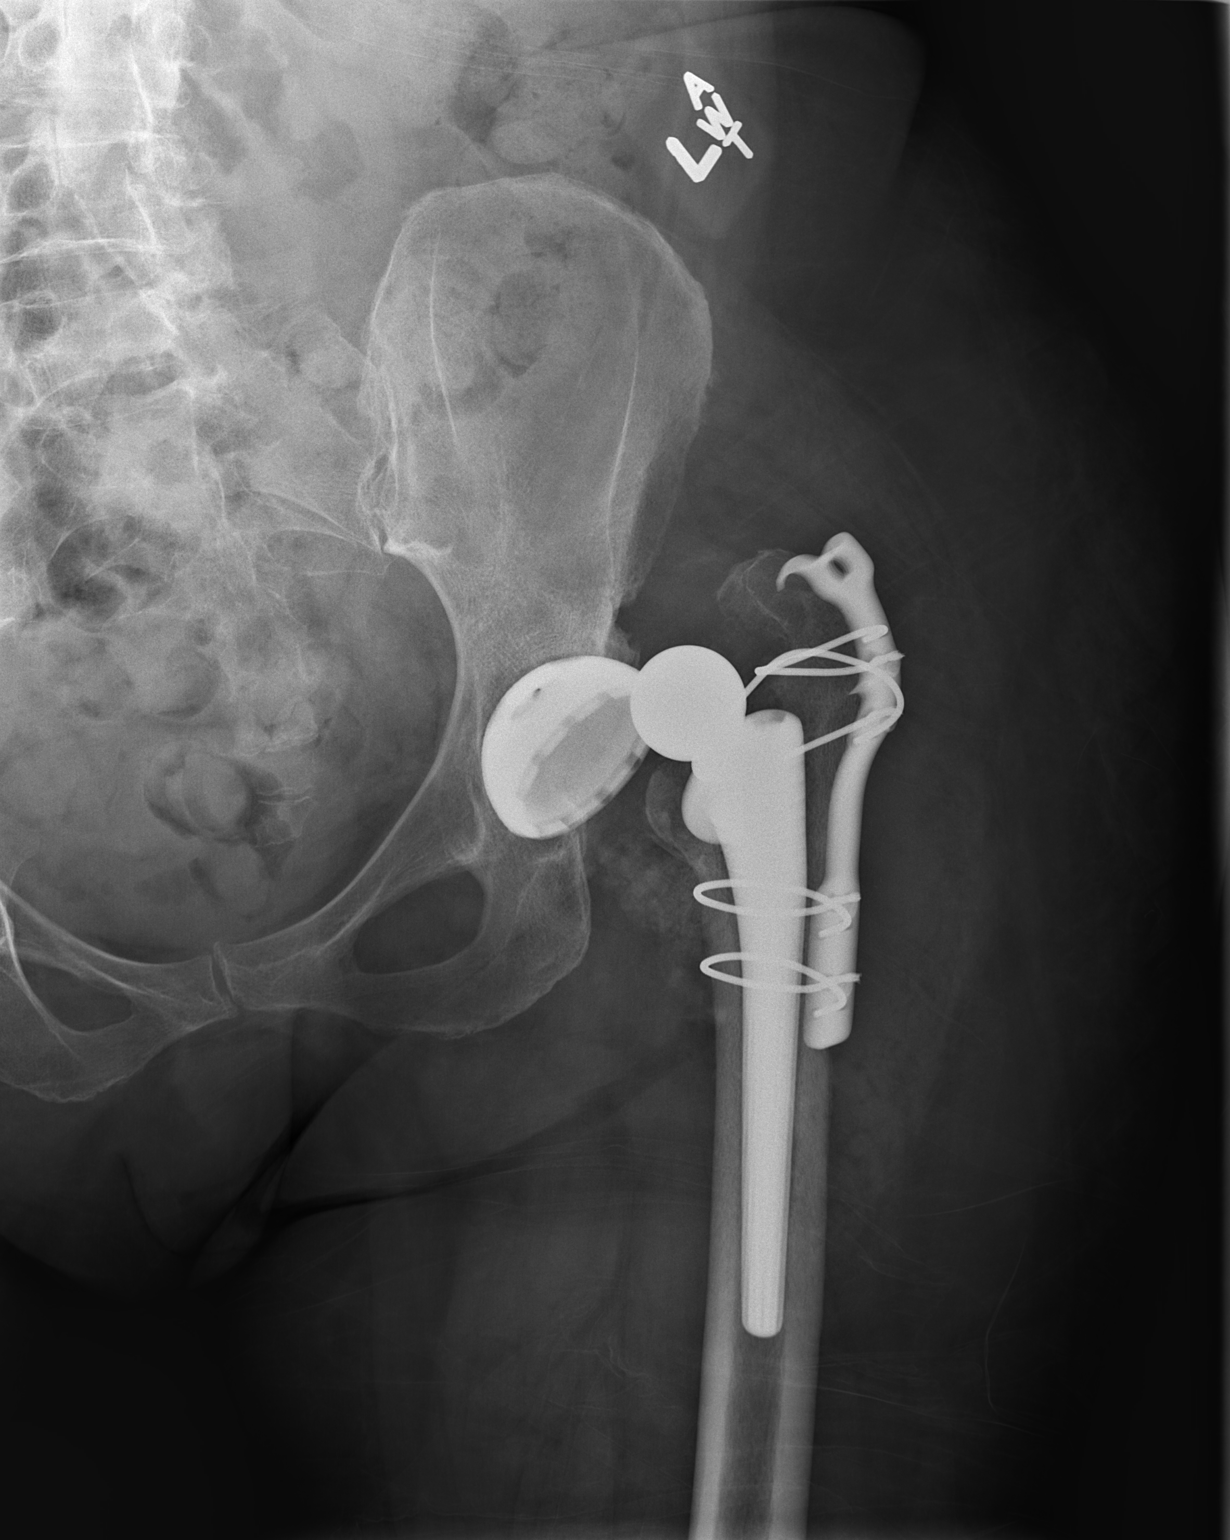

[w hip lat left]
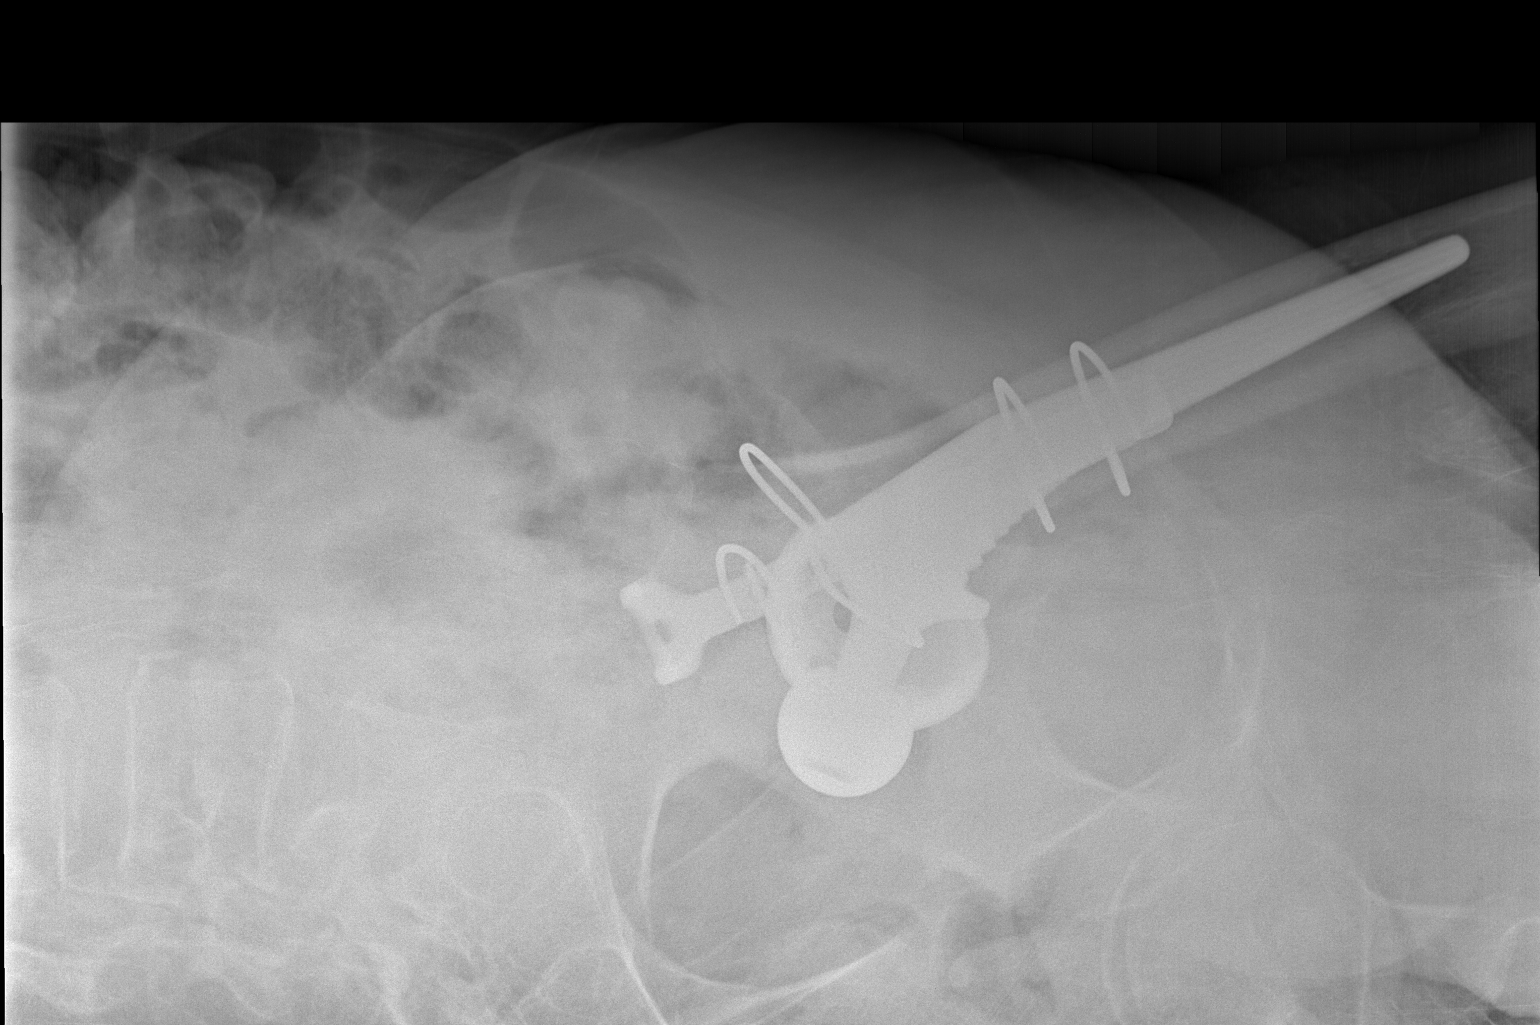

[3 of 3 positions shown; findings below may reference images not displayed]

FINDINGS: There is a left total hip arthroplasty. There is superior
dislocation of the left femoral head component relative to the
acetabular component. There is no acute fracture.

There is generalized osteopenia.
IMPRESSION: Left total hip arthroplasty with superior dislocation of the left
femoral head component relative to the acetabulum.

## 2014-11-14 IMAGING — CR DG FEMUR 2V*L*
4 series · 4 of 4 positions shown · non-contrast
Comparison: Preoperative exam 07/12/2013

CLINICAL DATA: Increasing LEFT leg pain on [REDACTED], post hip
replacement in [REDACTED], hip injury

EXAM:
LEFT FEMUR - 2 VIEW

[t femur proximal ap left]
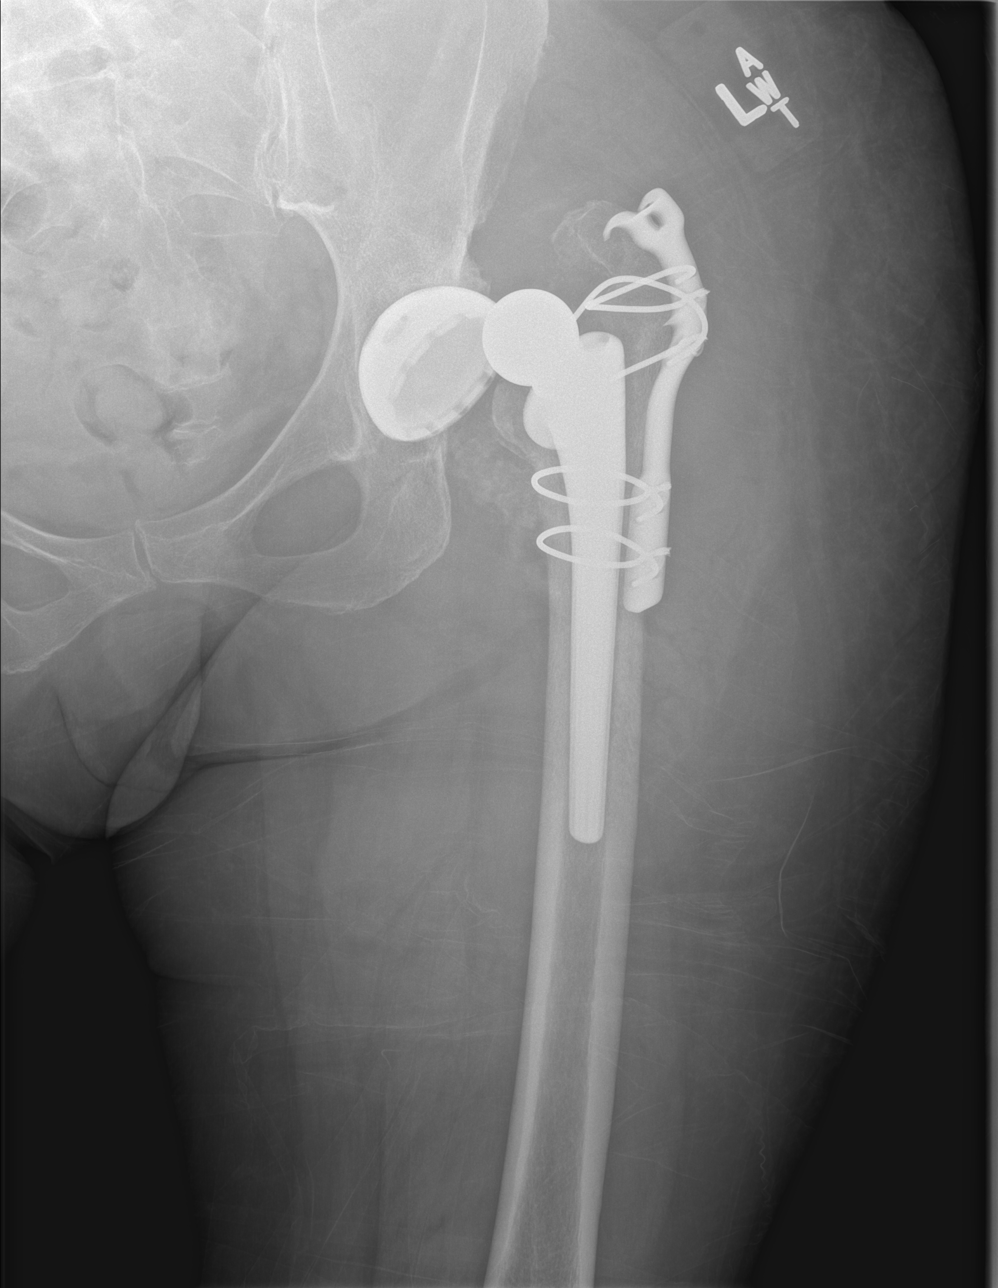

[t femur distal ap left]
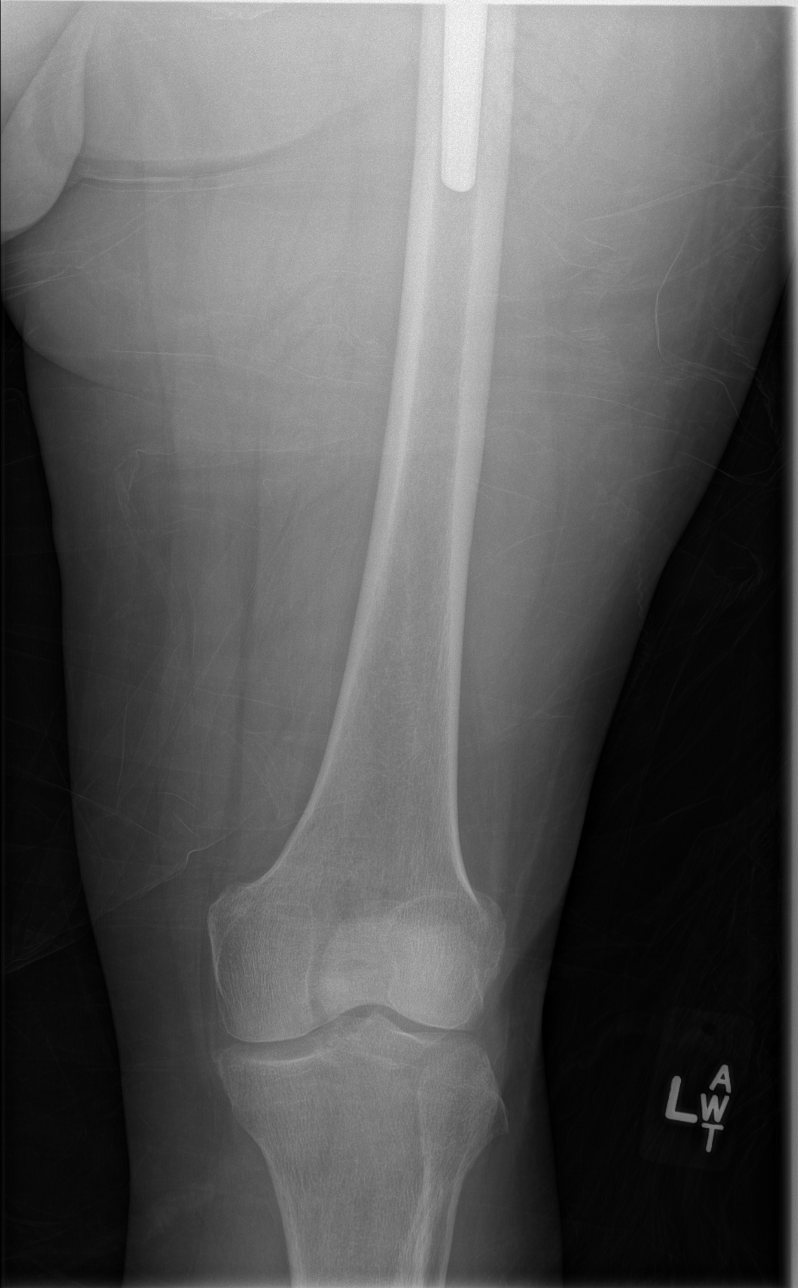

[w hip lat left]
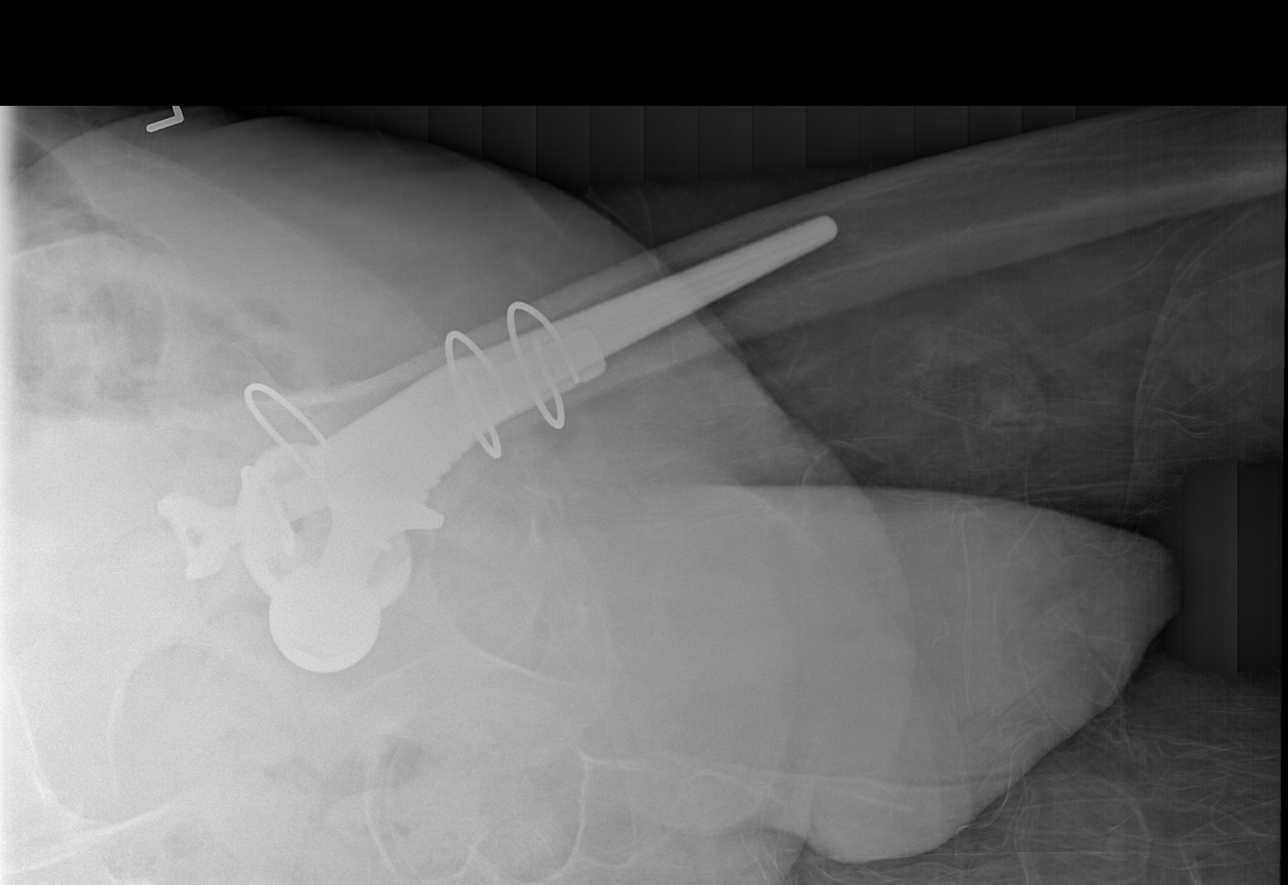

[w femur distal lat left]
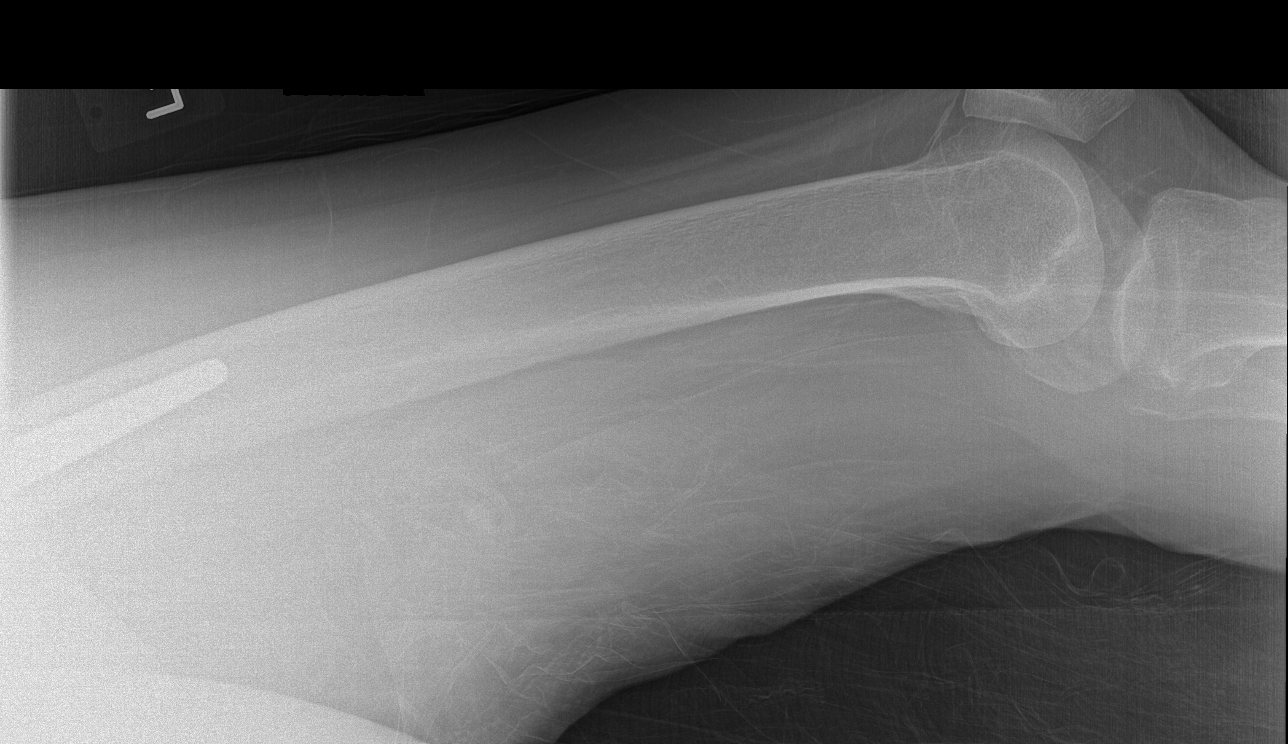

[4 of 4 positions shown; findings below may reference images not displayed]

FINDINGS: Osseous demineralization.

LEFT hip prosthesis identified with superior dislocation of the
femoral component.

Cerclage wires and lateral plate at the proximal LEFT femur appear
intact.

No periprosthetic lucency or fracture identified.

Visualized pelvis intact.

Knee joint spaces preserved.
IMPRESSION: Dislocated LEFT hip prosthesis.

Osseous demineralization.

## 2018-02-17 ENCOUNTER — Emergency Department: Admission: EM | Admit: 2018-02-17 | Discharge: 2018-02-17 | Discharge status: Absent without leave | Payer: Self-pay

## 2018-11-15 DEATH — deceased
